# Patient Record
Sex: Male | Born: 1970 | Race: Black or African American | Hispanic: No | Marital: Single | Smoking: Current every day smoker
Health system: Southern US, Community
[De-identification: ages and names within clinical notes are randomized; demographics above are authoritative.]

## PROBLEM LIST (undated history)

## (undated) VITALS — BP 134/69 | HR 83 | Temp 98.7°F | Resp 18 | Ht 70.0 in | Wt 147.0 lb

## (undated) DIAGNOSIS — F329 Major depressive disorder, single episode, unspecified: Secondary | ICD-10-CM

## (undated) DIAGNOSIS — R739 Hyperglycemia, unspecified: Secondary | ICD-10-CM

## (undated) DIAGNOSIS — F319 Bipolar disorder, unspecified: Secondary | ICD-10-CM

## (undated) DIAGNOSIS — F419 Anxiety disorder, unspecified: Secondary | ICD-10-CM

## (undated) DIAGNOSIS — F1910 Other psychoactive substance abuse, uncomplicated: Secondary | ICD-10-CM

## (undated) DIAGNOSIS — N179 Acute kidney failure, unspecified: Secondary | ICD-10-CM

## (undated) DIAGNOSIS — R45851 Suicidal ideations: Secondary | ICD-10-CM

## (undated) DIAGNOSIS — R112 Nausea with vomiting, unspecified: Secondary | ICD-10-CM

## (undated) HISTORY — PX: NO PAST SURGERIES: SHX2092

---

## 1998-01-31 ENCOUNTER — Emergency Department (HOSPITAL_COMMUNITY): Admission: EM | Admit: 1998-01-31 | Discharge: 1998-01-31 | Payer: Self-pay

## 1998-09-01 DIAGNOSIS — F32A Depression, unspecified: Secondary | ICD-10-CM

## 1998-09-01 HISTORY — DX: Depression, unspecified: F32.A

## 1999-06-30 ENCOUNTER — Emergency Department (HOSPITAL_COMMUNITY): Admission: EM | Admit: 1999-06-30 | Discharge: 1999-06-30 | Payer: Self-pay | Admitting: Emergency Medicine

## 1999-07-04 ENCOUNTER — Observation Stay (HOSPITAL_COMMUNITY): Admission: EM | Admit: 1999-07-04 | Discharge: 1999-07-05 | Payer: Self-pay | Admitting: Emergency Medicine

## 1999-11-11 ENCOUNTER — Encounter: Payer: Self-pay | Admitting: Emergency Medicine

## 1999-11-11 ENCOUNTER — Emergency Department (HOSPITAL_COMMUNITY): Admission: EM | Admit: 1999-11-11 | Discharge: 1999-11-11 | Payer: Self-pay | Admitting: Emergency Medicine

## 1999-11-27 ENCOUNTER — Emergency Department (HOSPITAL_COMMUNITY): Admission: EM | Admit: 1999-11-27 | Discharge: 1999-11-27 | Payer: Self-pay | Admitting: Emergency Medicine

## 2000-01-01 ENCOUNTER — Emergency Department (HOSPITAL_COMMUNITY): Admission: EM | Admit: 2000-01-01 | Discharge: 2000-01-01 | Payer: Self-pay | Admitting: Emergency Medicine

## 2000-01-14 ENCOUNTER — Encounter: Admission: RE | Admit: 2000-01-14 | Discharge: 2000-04-13 | Payer: Self-pay | Admitting: Internal Medicine

## 2000-04-30 ENCOUNTER — Emergency Department (HOSPITAL_COMMUNITY): Admission: EM | Admit: 2000-04-30 | Discharge: 2000-04-30 | Payer: Self-pay | Admitting: Emergency Medicine

## 2000-09-26 ENCOUNTER — Emergency Department (HOSPITAL_COMMUNITY): Admission: EM | Admit: 2000-09-26 | Discharge: 2000-09-26 | Payer: Self-pay | Admitting: Emergency Medicine

## 2000-09-26 ENCOUNTER — Encounter: Payer: Self-pay | Admitting: Internal Medicine

## 2000-09-29 ENCOUNTER — Emergency Department (HOSPITAL_COMMUNITY): Admission: EM | Admit: 2000-09-29 | Discharge: 2000-09-29 | Payer: Self-pay | Admitting: Emergency Medicine

## 2000-11-13 ENCOUNTER — Emergency Department (HOSPITAL_COMMUNITY): Admission: EM | Admit: 2000-11-13 | Discharge: 2000-11-13 | Payer: Self-pay | Admitting: Emergency Medicine

## 2000-11-21 ENCOUNTER — Emergency Department (HOSPITAL_COMMUNITY): Admission: EM | Admit: 2000-11-21 | Discharge: 2000-11-21 | Payer: Self-pay | Admitting: Emergency Medicine

## 2000-12-07 ENCOUNTER — Emergency Department (HOSPITAL_COMMUNITY): Admission: EM | Admit: 2000-12-07 | Discharge: 2000-12-07 | Payer: Self-pay | Admitting: Emergency Medicine

## 2001-03-31 ENCOUNTER — Emergency Department (HOSPITAL_COMMUNITY): Admission: EM | Admit: 2001-03-31 | Discharge: 2001-04-01 | Payer: Self-pay | Admitting: Obstetrics and Gynecology

## 2001-04-01 ENCOUNTER — Emergency Department (HOSPITAL_COMMUNITY): Admission: EM | Admit: 2001-04-01 | Discharge: 2001-04-01 | Payer: Self-pay | Admitting: Emergency Medicine

## 2001-04-02 ENCOUNTER — Inpatient Hospital Stay (HOSPITAL_COMMUNITY): Admission: EM | Admit: 2001-04-02 | Discharge: 2001-04-04 | Payer: Self-pay | Admitting: Emergency Medicine

## 2001-04-02 ENCOUNTER — Encounter: Payer: Self-pay | Admitting: Emergency Medicine

## 2001-10-20 ENCOUNTER — Emergency Department (HOSPITAL_COMMUNITY): Admission: EM | Admit: 2001-10-20 | Discharge: 2001-10-20 | Payer: Self-pay | Admitting: Emergency Medicine

## 2001-11-27 ENCOUNTER — Emergency Department (HOSPITAL_COMMUNITY): Admission: EM | Admit: 2001-11-27 | Discharge: 2001-11-27 | Payer: Self-pay | Admitting: Emergency Medicine

## 2001-11-27 ENCOUNTER — Encounter: Payer: Self-pay | Admitting: Emergency Medicine

## 2002-02-12 ENCOUNTER — Emergency Department (HOSPITAL_COMMUNITY): Admission: EM | Admit: 2002-02-12 | Discharge: 2002-02-12 | Payer: Self-pay | Admitting: Emergency Medicine

## 2002-02-13 ENCOUNTER — Inpatient Hospital Stay (HOSPITAL_COMMUNITY): Admission: EM | Admit: 2002-02-13 | Discharge: 2002-02-17 | Payer: Self-pay

## 2002-02-23 ENCOUNTER — Encounter: Admission: RE | Admit: 2002-02-23 | Discharge: 2002-02-23 | Payer: Self-pay | Admitting: Sports Medicine

## 2002-03-04 ENCOUNTER — Emergency Department (HOSPITAL_COMMUNITY): Admission: EM | Admit: 2002-03-04 | Discharge: 2002-03-04 | Payer: Self-pay | Admitting: Emergency Medicine

## 2002-08-03 ENCOUNTER — Emergency Department (HOSPITAL_COMMUNITY): Admission: EM | Admit: 2002-08-03 | Discharge: 2002-08-03 | Payer: Self-pay | Admitting: Emergency Medicine

## 2002-08-03 ENCOUNTER — Encounter: Payer: Self-pay | Admitting: Emergency Medicine

## 2002-09-08 ENCOUNTER — Inpatient Hospital Stay (HOSPITAL_COMMUNITY): Admission: EM | Admit: 2002-09-08 | Discharge: 2002-09-10 | Payer: Self-pay | Admitting: Emergency Medicine

## 2002-10-11 ENCOUNTER — Emergency Department (HOSPITAL_COMMUNITY): Admission: EM | Admit: 2002-10-11 | Discharge: 2002-10-11 | Payer: Self-pay | Admitting: Emergency Medicine

## 2002-10-17 ENCOUNTER — Inpatient Hospital Stay (HOSPITAL_COMMUNITY): Admission: EM | Admit: 2002-10-17 | Discharge: 2002-10-18 | Payer: Self-pay | Admitting: Emergency Medicine

## 2002-12-17 ENCOUNTER — Inpatient Hospital Stay (HOSPITAL_COMMUNITY): Admission: EM | Admit: 2002-12-17 | Discharge: 2002-12-19 | Payer: Self-pay | Admitting: *Deleted

## 2003-07-17 ENCOUNTER — Observation Stay (HOSPITAL_COMMUNITY): Admission: EM | Admit: 2003-07-17 | Discharge: 2003-07-18 | Payer: Self-pay | Admitting: Emergency Medicine

## 2003-08-25 ENCOUNTER — Emergency Department (HOSPITAL_COMMUNITY): Admission: EM | Admit: 2003-08-25 | Discharge: 2003-08-25 | Payer: Self-pay | Admitting: *Deleted

## 2003-11-17 ENCOUNTER — Emergency Department (HOSPITAL_COMMUNITY): Admission: EM | Admit: 2003-11-17 | Discharge: 2003-11-17 | Payer: Self-pay | Admitting: Emergency Medicine

## 2004-01-15 ENCOUNTER — Inpatient Hospital Stay (HOSPITAL_COMMUNITY): Admission: EM | Admit: 2004-01-15 | Discharge: 2004-01-18 | Payer: Self-pay | Admitting: Emergency Medicine

## 2004-04-07 ENCOUNTER — Inpatient Hospital Stay (HOSPITAL_COMMUNITY): Admission: EM | Admit: 2004-04-07 | Discharge: 2004-04-09 | Payer: Self-pay | Admitting: Emergency Medicine

## 2004-06-21 ENCOUNTER — Inpatient Hospital Stay (HOSPITAL_COMMUNITY): Admission: EM | Admit: 2004-06-21 | Discharge: 2004-06-24 | Payer: Self-pay | Admitting: Emergency Medicine

## 2004-06-21 ENCOUNTER — Ambulatory Visit: Payer: Self-pay | Admitting: Family Medicine

## 2004-07-14 ENCOUNTER — Inpatient Hospital Stay (HOSPITAL_COMMUNITY): Admission: EM | Admit: 2004-07-14 | Discharge: 2004-07-17 | Payer: Self-pay | Admitting: Emergency Medicine

## 2004-12-09 ENCOUNTER — Observation Stay (HOSPITAL_COMMUNITY): Admission: EM | Admit: 2004-12-09 | Discharge: 2004-12-10 | Payer: Self-pay | Admitting: Emergency Medicine

## 2004-12-21 ENCOUNTER — Emergency Department (HOSPITAL_COMMUNITY): Admission: EM | Admit: 2004-12-21 | Discharge: 2004-12-21 | Payer: Self-pay | Admitting: Emergency Medicine

## 2005-01-08 ENCOUNTER — Inpatient Hospital Stay (HOSPITAL_COMMUNITY): Admission: EM | Admit: 2005-01-08 | Discharge: 2005-01-10 | Payer: Self-pay | Admitting: Emergency Medicine

## 2005-01-22 ENCOUNTER — Inpatient Hospital Stay (HOSPITAL_COMMUNITY): Admission: EM | Admit: 2005-01-22 | Discharge: 2005-01-25 | Payer: Self-pay | Admitting: Emergency Medicine

## 2005-02-03 ENCOUNTER — Inpatient Hospital Stay (HOSPITAL_COMMUNITY): Admission: EM | Admit: 2005-02-03 | Discharge: 2005-02-06 | Payer: Self-pay | Admitting: Emergency Medicine

## 2005-02-08 ENCOUNTER — Emergency Department (HOSPITAL_COMMUNITY): Admission: EM | Admit: 2005-02-08 | Discharge: 2005-02-08 | Payer: Self-pay | Admitting: Emergency Medicine

## 2005-02-09 ENCOUNTER — Emergency Department (HOSPITAL_COMMUNITY): Admission: EM | Admit: 2005-02-09 | Discharge: 2005-02-10 | Payer: Self-pay | Admitting: Emergency Medicine

## 2005-02-23 ENCOUNTER — Inpatient Hospital Stay (HOSPITAL_COMMUNITY): Admission: EM | Admit: 2005-02-23 | Discharge: 2005-02-26 | Payer: Self-pay | Admitting: Emergency Medicine

## 2005-02-23 ENCOUNTER — Ambulatory Visit: Payer: Self-pay | Admitting: Internal Medicine

## 2005-03-17 ENCOUNTER — Inpatient Hospital Stay (HOSPITAL_COMMUNITY): Admission: EM | Admit: 2005-03-17 | Discharge: 2005-03-19 | Payer: Self-pay | Admitting: Emergency Medicine

## 2005-05-19 ENCOUNTER — Ambulatory Visit: Payer: Self-pay | Admitting: Internal Medicine

## 2005-05-19 ENCOUNTER — Inpatient Hospital Stay (HOSPITAL_COMMUNITY): Admission: EM | Admit: 2005-05-19 | Discharge: 2005-05-23 | Payer: Self-pay | Admitting: Emergency Medicine

## 2005-05-29 ENCOUNTER — Ambulatory Visit: Payer: Self-pay | Admitting: Internal Medicine

## 2005-06-04 ENCOUNTER — Inpatient Hospital Stay (HOSPITAL_COMMUNITY): Admission: RE | Admit: 2005-06-04 | Discharge: 2005-06-10 | Payer: Self-pay | Admitting: Psychiatry

## 2005-06-05 ENCOUNTER — Ambulatory Visit: Payer: Self-pay | Admitting: Psychiatry

## 2005-06-16 ENCOUNTER — Inpatient Hospital Stay (HOSPITAL_COMMUNITY): Admission: EM | Admit: 2005-06-16 | Discharge: 2005-06-20 | Payer: Self-pay | Admitting: Emergency Medicine

## 2005-08-09 ENCOUNTER — Emergency Department (HOSPITAL_COMMUNITY): Admission: EM | Admit: 2005-08-09 | Discharge: 2005-08-09 | Payer: Self-pay | Admitting: Emergency Medicine

## 2005-08-14 ENCOUNTER — Inpatient Hospital Stay (HOSPITAL_COMMUNITY): Admission: EM | Admit: 2005-08-14 | Discharge: 2005-08-15 | Payer: Self-pay | Admitting: Emergency Medicine

## 2005-08-21 ENCOUNTER — Ambulatory Visit: Payer: Self-pay | Admitting: Internal Medicine

## 2005-08-21 ENCOUNTER — Inpatient Hospital Stay (HOSPITAL_COMMUNITY): Admission: EM | Admit: 2005-08-21 | Discharge: 2005-08-26 | Payer: Self-pay | Admitting: Emergency Medicine

## 2005-08-28 ENCOUNTER — Ambulatory Visit: Payer: Self-pay | Admitting: Internal Medicine

## 2005-12-13 ENCOUNTER — Inpatient Hospital Stay (HOSPITAL_COMMUNITY): Admission: EM | Admit: 2005-12-13 | Discharge: 2005-12-15 | Payer: Self-pay | Admitting: Emergency Medicine

## 2005-12-13 ENCOUNTER — Ambulatory Visit: Payer: Self-pay | Admitting: Internal Medicine

## 2005-12-14 ENCOUNTER — Encounter: Payer: Self-pay | Admitting: Vascular Surgery

## 2006-01-14 ENCOUNTER — Ambulatory Visit: Payer: Self-pay | Admitting: Hospitalist

## 2006-01-14 ENCOUNTER — Inpatient Hospital Stay (HOSPITAL_COMMUNITY): Admission: EM | Admit: 2006-01-14 | Discharge: 2006-01-16 | Payer: Self-pay | Admitting: Internal Medicine

## 2006-01-18 ENCOUNTER — Inpatient Hospital Stay (HOSPITAL_COMMUNITY): Admission: EM | Admit: 2006-01-18 | Discharge: 2006-01-19 | Payer: Self-pay | Admitting: Emergency Medicine

## 2006-05-31 ENCOUNTER — Inpatient Hospital Stay (HOSPITAL_COMMUNITY): Admission: EM | Admit: 2006-05-31 | Discharge: 2006-06-03 | Payer: Self-pay | Admitting: *Deleted

## 2006-06-11 ENCOUNTER — Inpatient Hospital Stay (HOSPITAL_COMMUNITY): Admission: EM | Admit: 2006-06-11 | Discharge: 2006-06-18 | Payer: Self-pay | Admitting: Emergency Medicine

## 2006-06-11 ENCOUNTER — Ambulatory Visit: Payer: Self-pay | Admitting: Internal Medicine

## 2006-09-30 ENCOUNTER — Inpatient Hospital Stay (HOSPITAL_COMMUNITY): Admission: AD | Admit: 2006-09-30 | Discharge: 2006-10-06 | Payer: Self-pay | Admitting: Internal Medicine

## 2006-09-30 ENCOUNTER — Encounter: Payer: Self-pay | Admitting: Internal Medicine

## 2006-10-26 ENCOUNTER — Inpatient Hospital Stay (HOSPITAL_COMMUNITY): Admission: EM | Admit: 2006-10-26 | Discharge: 2006-10-29 | Payer: Self-pay | Admitting: Emergency Medicine

## 2006-10-26 ENCOUNTER — Ambulatory Visit: Payer: Self-pay | Admitting: Cardiology

## 2006-10-26 ENCOUNTER — Ambulatory Visit: Payer: Self-pay | Admitting: Family Medicine

## 2006-10-27 ENCOUNTER — Ambulatory Visit: Payer: Self-pay | Admitting: Vascular Surgery

## 2006-10-28 ENCOUNTER — Encounter: Payer: Self-pay | Admitting: Cardiology

## 2006-10-31 ENCOUNTER — Ambulatory Visit: Payer: Self-pay | Admitting: Family Medicine

## 2006-10-31 ENCOUNTER — Inpatient Hospital Stay (HOSPITAL_COMMUNITY): Admission: EM | Admit: 2006-10-31 | Discharge: 2006-11-12 | Payer: Self-pay | Admitting: Emergency Medicine

## 2006-11-16 ENCOUNTER — Ambulatory Visit: Payer: Self-pay | Admitting: Internal Medicine

## 2006-11-16 DIAGNOSIS — I81 Portal vein thrombosis: Secondary | ICD-10-CM

## 2006-11-16 LAB — CONVERTED CEMR LAB: INR: 4

## 2006-11-17 ENCOUNTER — Telehealth: Payer: Self-pay | Admitting: Family Medicine

## 2006-11-20 ENCOUNTER — Ambulatory Visit: Payer: Self-pay | Admitting: Family Medicine

## 2006-11-20 LAB — CONVERTED CEMR LAB

## 2006-12-14 ENCOUNTER — Encounter: Payer: Self-pay | Admitting: Family Medicine

## 2007-04-26 ENCOUNTER — Ambulatory Visit: Payer: Self-pay | Admitting: Internal Medicine

## 2007-04-26 ENCOUNTER — Inpatient Hospital Stay (HOSPITAL_COMMUNITY): Admission: AD | Admit: 2007-04-26 | Discharge: 2007-05-03 | Payer: Self-pay | Admitting: Internal Medicine

## 2007-04-26 DIAGNOSIS — E109 Type 1 diabetes mellitus without complications: Secondary | ICD-10-CM | POA: Insufficient documentation

## 2007-04-26 DIAGNOSIS — R109 Unspecified abdominal pain: Secondary | ICD-10-CM | POA: Insufficient documentation

## 2007-04-26 DIAGNOSIS — F17200 Nicotine dependence, unspecified, uncomplicated: Secondary | ICD-10-CM

## 2007-04-26 LAB — CONVERTED CEMR LAB: Blood Glucose, Fingerstick: 395

## 2007-04-27 ENCOUNTER — Encounter (INDEPENDENT_AMBULATORY_CARE_PROVIDER_SITE_OTHER): Payer: Self-pay | Admitting: *Deleted

## 2007-05-12 ENCOUNTER — Encounter (INDEPENDENT_AMBULATORY_CARE_PROVIDER_SITE_OTHER): Payer: Self-pay | Admitting: *Deleted

## 2007-05-12 ENCOUNTER — Ambulatory Visit: Payer: Self-pay | Admitting: Internal Medicine

## 2007-05-12 DIAGNOSIS — R894 Abnormal immunological findings in specimens from other organs, systems and tissues: Secondary | ICD-10-CM | POA: Insufficient documentation

## 2007-05-12 LAB — CONVERTED CEMR LAB
BUN: 22 mg/dL (ref 6–23)
Bilirubin Urine: NEGATIVE
Blood in Urine, dipstick: NEGATIVE
Chloride: 100 meq/L (ref 96–112)
Glucose, Urine, Semiquant: >=1000
Nitrite: NEGATIVE
Platelets: 285 10*3/uL (ref 150–400)
Potassium: 4.6 meq/L (ref 3.5–5.3)
Protein, U semiquant: NEGATIVE
Sodium: 136 meq/L (ref 135–145)
Urobilinogen, UA: 0.2
WBC Urine, dipstick: NEGATIVE

## 2007-05-18 ENCOUNTER — Telehealth: Payer: Self-pay | Admitting: *Deleted

## 2007-05-18 ENCOUNTER — Ambulatory Visit: Payer: Self-pay | Admitting: Family Medicine

## 2007-06-04 ENCOUNTER — Emergency Department (HOSPITAL_COMMUNITY): Admission: EM | Admit: 2007-06-04 | Discharge: 2007-06-05 | Payer: Self-pay | Admitting: Emergency Medicine

## 2007-06-07 ENCOUNTER — Inpatient Hospital Stay (HOSPITAL_COMMUNITY): Admission: EM | Admit: 2007-06-07 | Discharge: 2007-06-10 | Payer: Self-pay | Admitting: Emergency Medicine

## 2007-06-07 ENCOUNTER — Ambulatory Visit: Payer: Self-pay | Admitting: Internal Medicine

## 2007-08-16 ENCOUNTER — Emergency Department (HOSPITAL_COMMUNITY): Admission: EM | Admit: 2007-08-16 | Discharge: 2007-08-16 | Payer: Self-pay | Admitting: Emergency Medicine

## 2007-10-14 ENCOUNTER — Ambulatory Visit: Payer: Self-pay | Admitting: Family Medicine

## 2007-10-14 ENCOUNTER — Ambulatory Visit: Payer: Self-pay | Admitting: *Deleted

## 2007-10-14 LAB — CONVERTED CEMR LAB
AST: 20 units/L (ref 0–37)
Alkaline Phosphatase: 88 units/L (ref 39–117)
BUN: 20 mg/dL (ref 6–23)
Basophils Absolute: 0 10*3/uL (ref 0.0–0.1)
CO2: 25 meq/L (ref 19–32)
Creatinine, Ser: 0.84 mg/dL (ref 0.40–1.50)
Lymphocytes Relative: 29 % (ref 12–46)
Lymphs Abs: 1.2 10*3/uL (ref 0.7–4.0)
MCHC: 31.3 g/dL (ref 30.0–36.0)
MCV: 91.1 fL (ref 78.0–100.0)
Microalb, Ur: 0.25 mg/dL (ref 0.00–1.89)
Monocytes Relative: 8 % (ref 3–12)
Neutro Abs: 2.7 10*3/uL (ref 1.7–7.7)
RBC: 4.59 M/uL (ref 4.22–5.81)
RDW: 15.9 % — ABNORMAL HIGH (ref 11.5–15.5)
Total Bilirubin: 0.4 mg/dL (ref 0.3–1.2)
Total Protein: 6.8 g/dL (ref 6.0–8.3)
Triglycerides: 180 mg/dL — ABNORMAL HIGH (ref <150)
WBC: 4.3 10*3/uL (ref 4.0–10.5)

## 2007-12-20 ENCOUNTER — Ambulatory Visit: Payer: Self-pay | Admitting: Family Medicine

## 2007-12-22 ENCOUNTER — Ambulatory Visit (HOSPITAL_COMMUNITY): Admission: RE | Admit: 2007-12-22 | Discharge: 2007-12-22 | Payer: Self-pay | Admitting: Family Medicine

## 2008-06-14 ENCOUNTER — Emergency Department (HOSPITAL_COMMUNITY): Admission: EM | Admit: 2008-06-14 | Discharge: 2008-06-14 | Payer: Self-pay | Admitting: Emergency Medicine

## 2008-06-18 ENCOUNTER — Inpatient Hospital Stay (HOSPITAL_COMMUNITY): Admission: EM | Admit: 2008-06-18 | Discharge: 2008-06-20 | Payer: Self-pay | Admitting: Emergency Medicine

## 2008-06-18 ENCOUNTER — Ambulatory Visit: Payer: Self-pay | Admitting: Internal Medicine

## 2008-08-31 IMAGING — CR DG ABDOMEN 2V
2 series · 2 of 2 positions shown · non-contrast
Comparison: none

CLINICAL DATA: Abdominal pain.  Assess for constipation.
 ABDOMEN ? 2 VIEW:

[w abdomen upright]
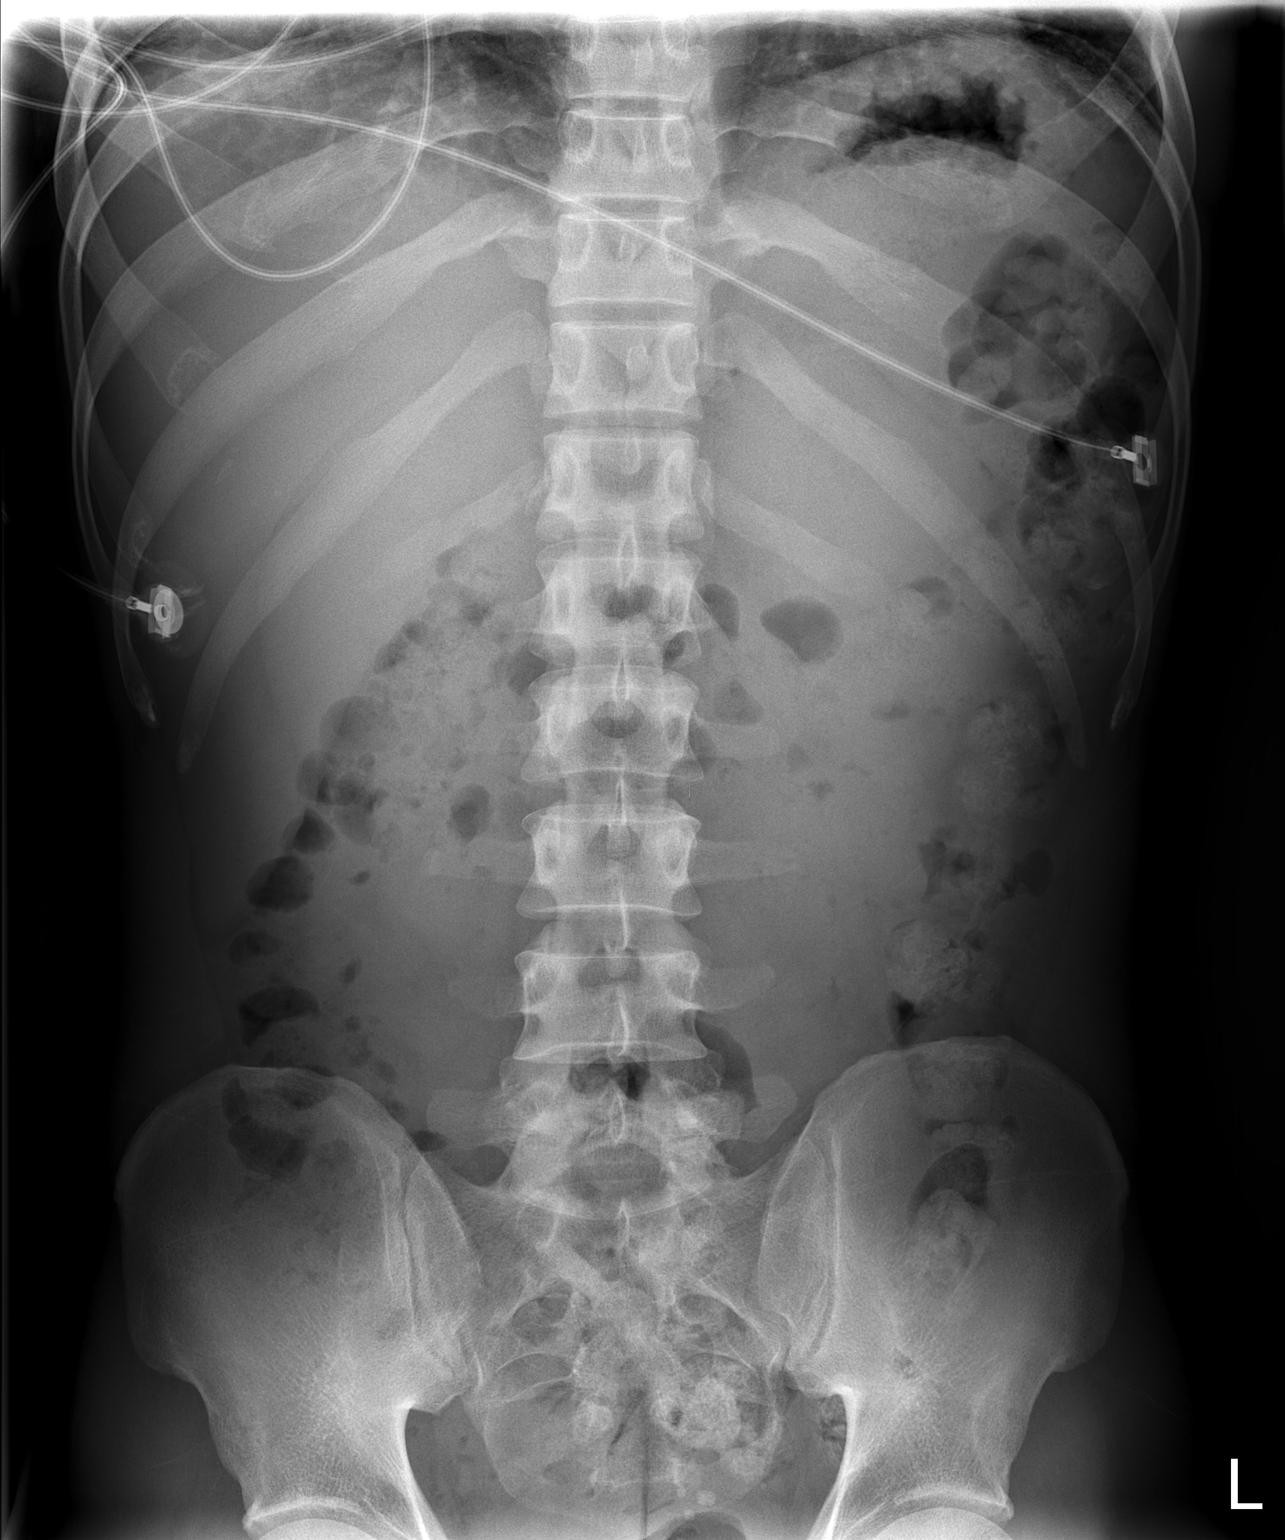

[t abdomen supine]
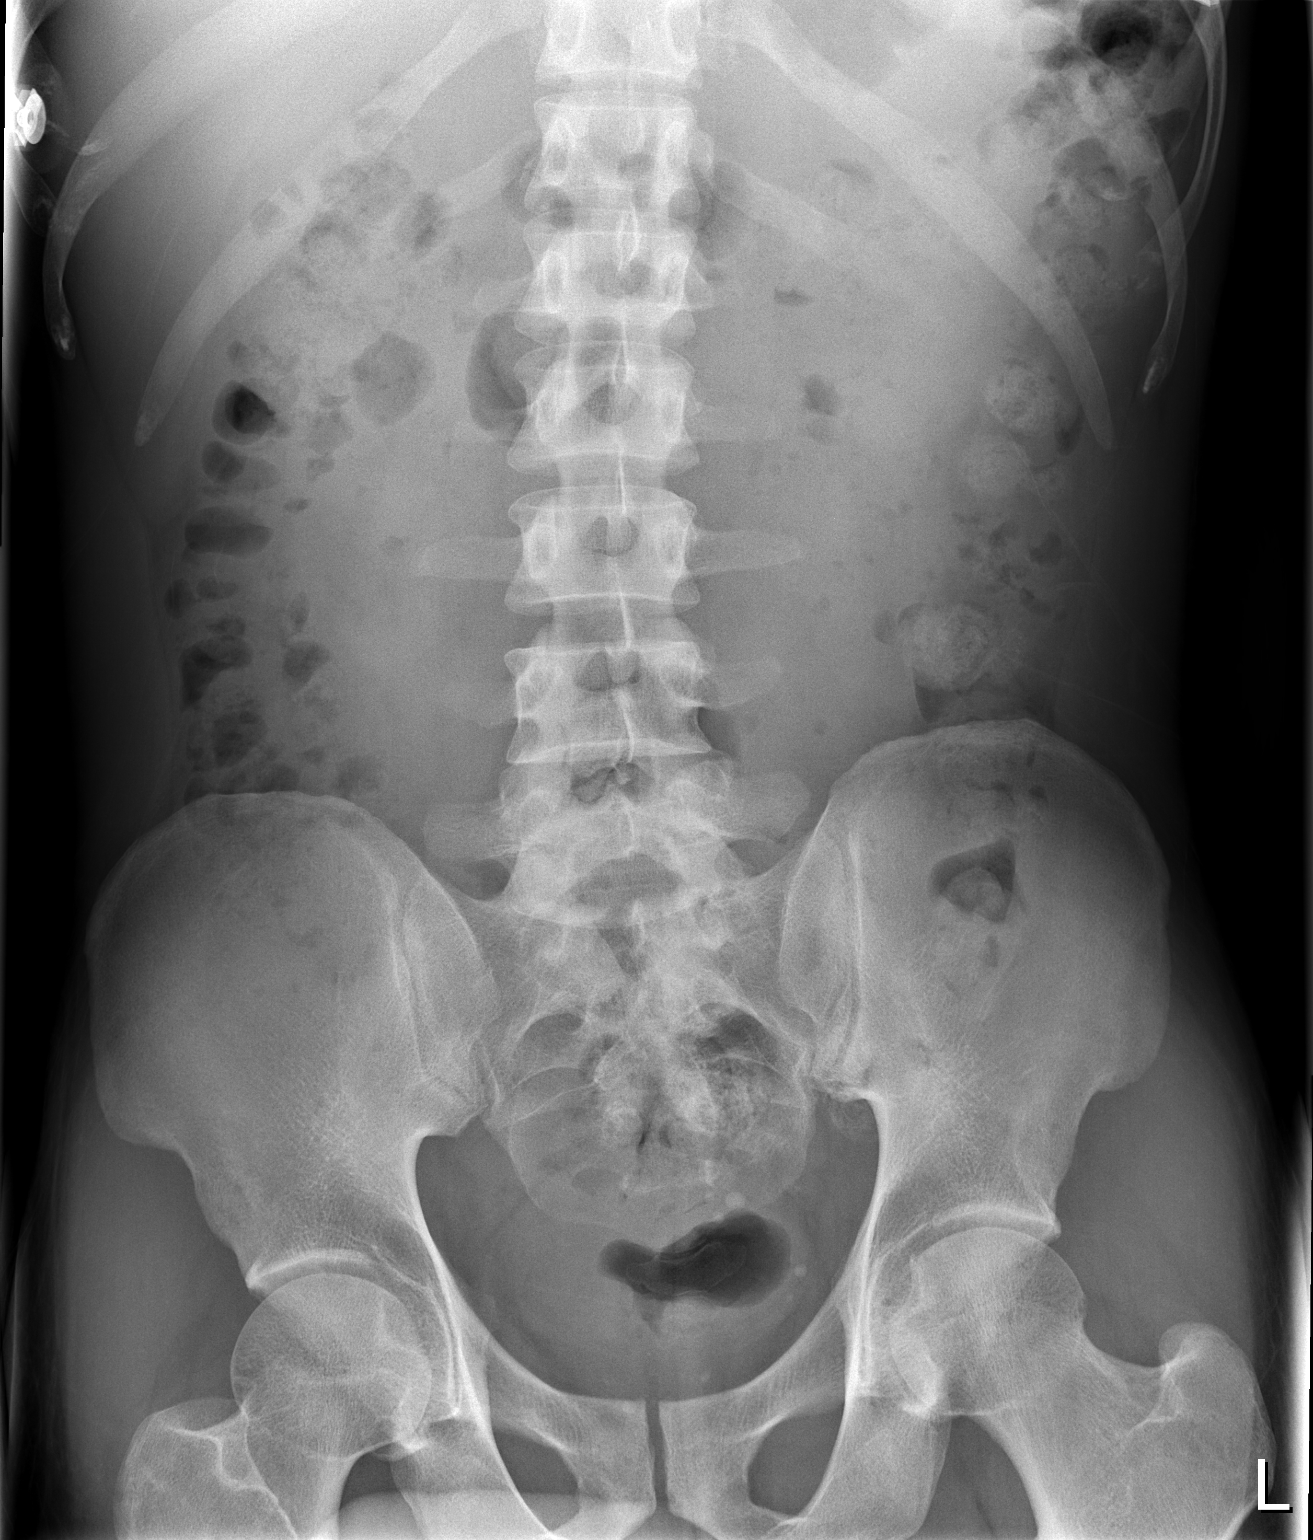

[2 of 2 positions shown; findings below may reference images not displayed]

FINDINGS: Small bowel gas pattern is normal.  The colonic gas pattern is unremarkable wit a grossly normal volume of stool.  No worrisome calcifications or bony findings.
IMPRESSION: Within normal limits.

## 2008-09-03 IMAGING — CR DG ABDOMEN 2V
1 series · 1 of 1 positions shown · non-contrast
Comparison: 10/28/06.

CLINICAL DATA: Vomiting.
 ABDOMEN ? 2 VIEW:

[view not recorded]
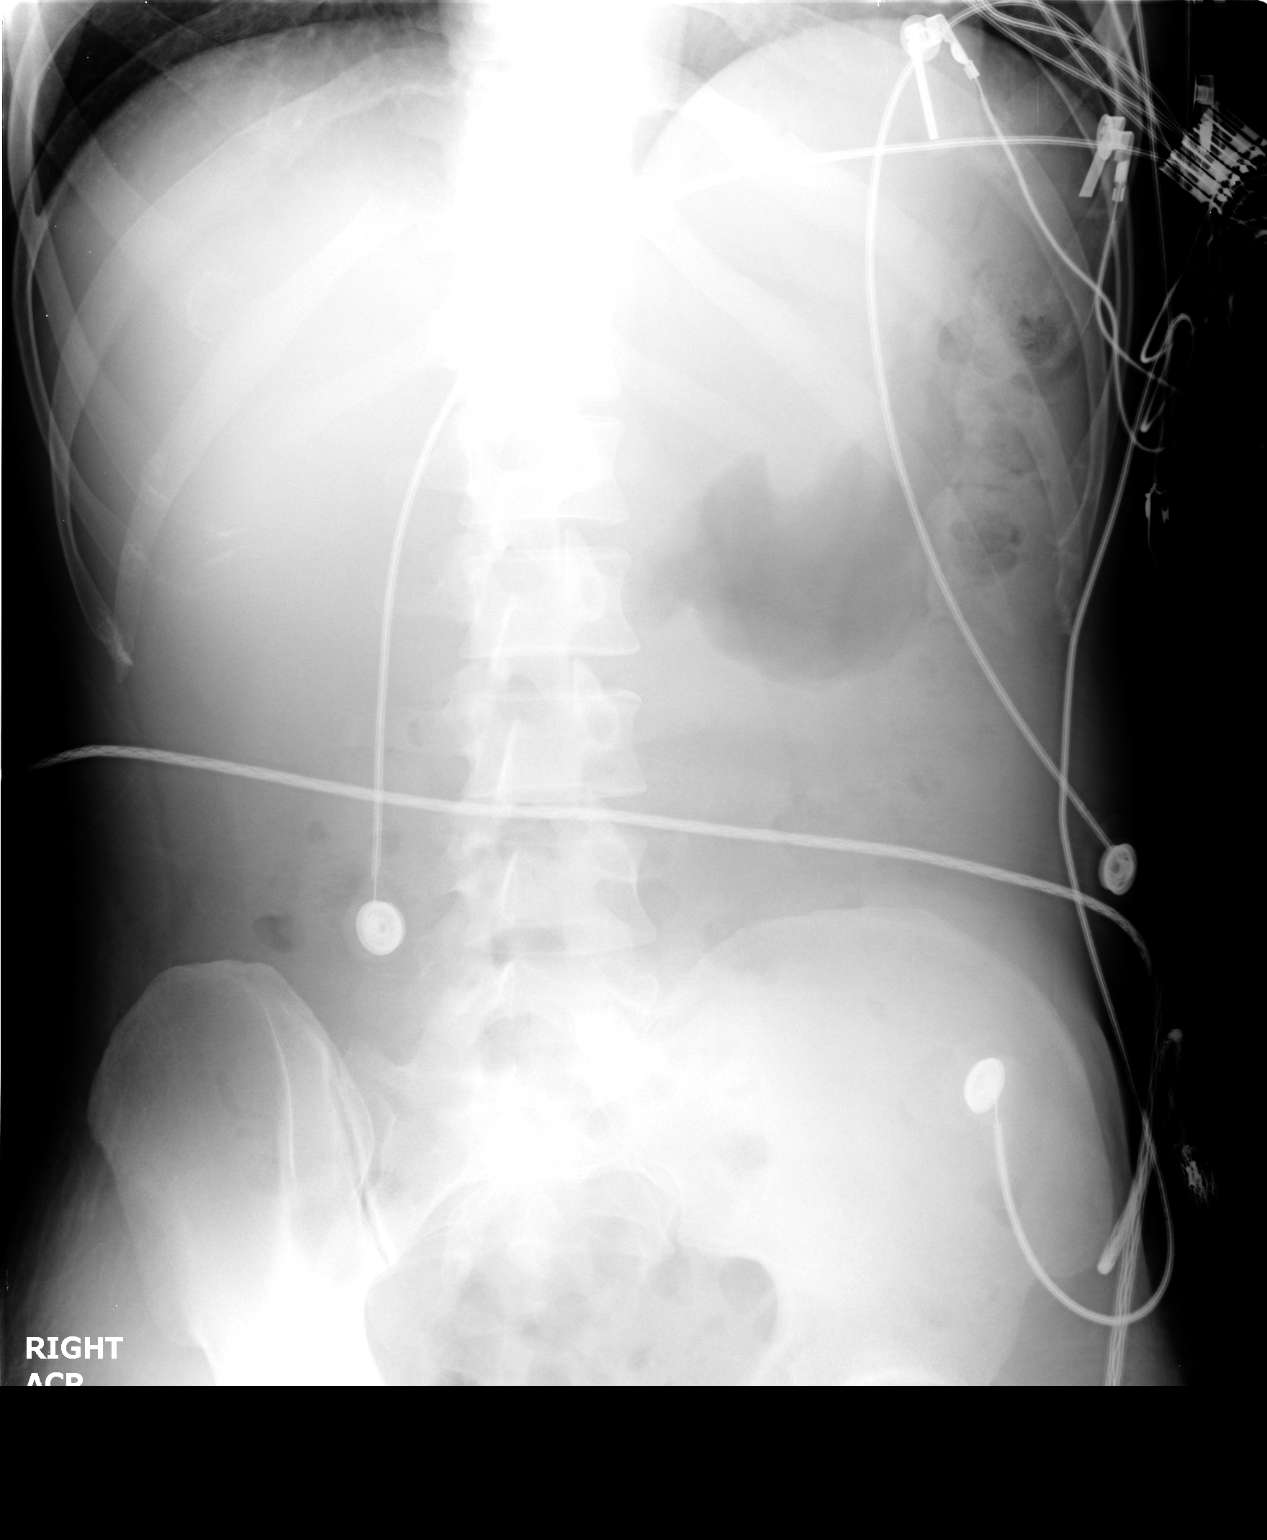

[1 of 1 positions shown; findings below may reference images not displayed]

FINDINGS: No free air is seen on decubitus projection.  Bowel gas pattern is normal.  No acute osseous finding.
IMPRESSION: Normal bowel gas pattern.

## 2008-10-16 ENCOUNTER — Emergency Department (HOSPITAL_COMMUNITY): Admission: EM | Admit: 2008-10-16 | Discharge: 2008-10-16 | Payer: Self-pay | Admitting: Emergency Medicine

## 2009-04-09 ENCOUNTER — Emergency Department (HOSPITAL_COMMUNITY): Admission: EM | Admit: 2009-04-09 | Discharge: 2009-04-09 | Payer: Self-pay | Admitting: Emergency Medicine

## 2009-04-10 ENCOUNTER — Encounter: Payer: Self-pay | Admitting: Internal Medicine

## 2009-06-01 ENCOUNTER — Encounter (INDEPENDENT_AMBULATORY_CARE_PROVIDER_SITE_OTHER): Payer: Self-pay | Admitting: Adult Health

## 2009-06-01 ENCOUNTER — Ambulatory Visit: Payer: Self-pay | Admitting: Family Medicine

## 2009-06-01 LAB — CONVERTED CEMR LAB
ALT: 31 units/L (ref 0–53)
Albumin: 4.1 g/dL (ref 3.5–5.2)
BUN: 16 mg/dL (ref 6–23)
Basophils Absolute: 0 10*3/uL (ref 0.0–0.1)
Chloride: 99 meq/L (ref 96–112)
Creatinine, Ser: 1.22 mg/dL (ref 0.40–1.50)
HCT: 40.2 % (ref 39.0–52.0)
HDL: 58 mg/dL (ref >39)
Hemoglobin: 13.5 g/dL (ref 13.0–17.0)
LDL Cholesterol: 101 mg/dL — ABNORMAL HIGH (ref 0–99)
Lymphs Abs: 1.6 10*3/uL (ref 0.7–4.0)
MCV: 86.8 fL (ref 78.0–100.0)
Monocytes Relative: 6 % (ref 3–12)
Neutrophils Relative %: 55 % (ref 43–77)
RBC: 4.63 M/uL (ref 4.22–5.81)
Testosterone: 139.7 ng/dL — ABNORMAL LOW (ref 350–890)
Total CHOL/HDL Ratio: 3.7
Triglycerides: 274 mg/dL — ABNORMAL HIGH (ref <150)
Vit D, 25-Hydroxy: 24 ng/mL — ABNORMAL LOW (ref 30–89)
WBC: 4.3 10*3/uL (ref 4.0–10.5)

## 2009-10-18 ENCOUNTER — Ambulatory Visit: Payer: Self-pay | Admitting: Family Medicine

## 2010-02-11 ENCOUNTER — Emergency Department (HOSPITAL_COMMUNITY)
Admission: EM | Admit: 2010-02-11 | Discharge: 2010-02-12 | Payer: Self-pay | Source: Home / Self Care | Admitting: Emergency Medicine

## 2010-05-23 ENCOUNTER — Emergency Department (HOSPITAL_COMMUNITY): Admission: EM | Admit: 2010-05-23 | Discharge: 2010-05-23 | Payer: Self-pay | Admitting: Emergency Medicine

## 2010-09-01 DIAGNOSIS — F1910 Other psychoactive substance abuse, uncomplicated: Secondary | ICD-10-CM

## 2010-09-01 HISTORY — DX: Other psychoactive substance abuse, uncomplicated: F19.10

## 2010-10-20 ENCOUNTER — Emergency Department (HOSPITAL_COMMUNITY)
Admission: EM | Admit: 2010-10-20 | Discharge: 2010-10-20 | Disposition: A | Discharge status: Home / Self Care | Payer: Self-pay | Attending: Emergency Medicine | Admitting: Emergency Medicine

## 2010-10-20 DIAGNOSIS — I10 Essential (primary) hypertension: Secondary | ICD-10-CM | POA: Insufficient documentation

## 2010-10-20 DIAGNOSIS — E119 Type 2 diabetes mellitus without complications: Secondary | ICD-10-CM | POA: Insufficient documentation

## 2010-10-20 DIAGNOSIS — R197 Diarrhea, unspecified: Secondary | ICD-10-CM | POA: Insufficient documentation

## 2010-10-20 DIAGNOSIS — D6859 Other primary thrombophilia: Secondary | ICD-10-CM | POA: Insufficient documentation

## 2010-10-20 DIAGNOSIS — R112 Nausea with vomiting, unspecified: Secondary | ICD-10-CM | POA: Insufficient documentation

## 2010-10-20 DIAGNOSIS — R109 Unspecified abdominal pain: Secondary | ICD-10-CM | POA: Insufficient documentation

## 2010-10-20 LAB — GLUCOSE, CAPILLARY: Glucose-Capillary: 163 mg/dL — ABNORMAL HIGH (ref 70–99)

## 2010-10-20 LAB — CBC
Hemoglobin: 14.2 g/dL (ref 13.0–17.0)
MCHC: 33.1 g/dL (ref 30.0–36.0)
WBC: 7.8 10*3/uL (ref 4.0–10.5)

## 2010-10-20 LAB — COMPREHENSIVE METABOLIC PANEL
ALT: 20 U/L (ref 0–53)
AST: 23 U/L (ref 0–37)
CO2: 31 mEq/L (ref 19–32)
Calcium: 9.9 mg/dL (ref 8.4–10.5)
GFR calc Af Amer: >60 mL/min (ref >60)
GFR calc non Af Amer: >60 mL/min (ref >60)
Potassium: 4.5 mEq/L (ref 3.5–5.1)
Sodium: 142 mEq/L (ref 135–145)
Total Protein: 7.4 g/dL (ref 6.0–8.3)

## 2010-10-20 LAB — DIFFERENTIAL
Basophils Absolute: 0 10*3/uL (ref 0.0–0.1)
Basophils Relative: 0 % (ref 0–1)
Monocytes Absolute: 1 10*3/uL (ref 0.1–1.0)
Neutro Abs: 5 10*3/uL (ref 1.7–7.7)
Neutrophils Relative %: 64 % (ref 43–77)

## 2010-11-14 LAB — URINALYSIS, ROUTINE W REFLEX MICROSCOPIC
Glucose, UA: >1000 mg/dL — AB
Ketones, ur: 15 mg/dL — AB
Leukocytes, UA: NEGATIVE
Nitrite: NEGATIVE
Protein, ur: NEGATIVE mg/dL

## 2010-11-14 LAB — COMPREHENSIVE METABOLIC PANEL
ALT: 42 U/L (ref 0–53)
Alkaline Phosphatase: 83 U/L (ref 39–117)
CO2: 26 mEq/L (ref 19–32)
GFR calc non Af Amer: >60 mL/min (ref >60)
Glucose, Bld: 420 mg/dL — ABNORMAL HIGH (ref 70–99)
Potassium: 5.2 mEq/L — ABNORMAL HIGH (ref 3.5–5.1)
Sodium: 134 mEq/L — ABNORMAL LOW (ref 135–145)

## 2010-11-14 LAB — LIPASE, BLOOD: Lipase: 36 U/L (ref 11–59)

## 2010-11-14 LAB — CBC
HCT: 40.9 % (ref 39.0–52.0)
Hemoglobin: 14.1 g/dL (ref 13.0–17.0)
WBC: 4 10*3/uL (ref 4.0–10.5)

## 2010-11-14 LAB — GLUCOSE, CAPILLARY

## 2010-11-14 LAB — DIFFERENTIAL
Basophils Relative: 1 % (ref 0–1)
Eosinophils Absolute: 0.1 10*3/uL (ref 0.0–0.7)
Neutrophils Relative %: 47 % (ref 43–77)

## 2010-11-18 LAB — GLUCOSE, CAPILLARY
Glucose-Capillary: 387 mg/dL — ABNORMAL HIGH (ref 70–99)
Glucose-Capillary: 590 mg/dL (ref 70–99)

## 2010-12-07 LAB — POCT I-STAT, CHEM 8
Calcium, Ion: 1.16 mmol/L (ref 1.12–1.32)
Glucose, Bld: 491 mg/dL — ABNORMAL HIGH (ref 70–99)
HCT: 48 % (ref 39.0–52.0)
Hemoglobin: 16.3 g/dL (ref 13.0–17.0)
Potassium: 4.6 mEq/L (ref 3.5–5.1)
TCO2: 26 mmol/L (ref 0–100)

## 2010-12-07 LAB — POCT I-STAT 3, ART BLOOD GAS (G3+)
O2 Saturation: 91 %
Patient temperature: 98.6
TCO2: 27 mmol/L (ref 0–100)
pH, Arterial: 7.364 (ref 7.350–7.450)

## 2010-12-07 LAB — GLUCOSE, CAPILLARY
Glucose-Capillary: 149 mg/dL — ABNORMAL HIGH (ref 70–99)
Glucose-Capillary: 224 mg/dL — ABNORMAL HIGH (ref 70–99)
Glucose-Capillary: 575 mg/dL (ref 70–99)

## 2010-12-07 LAB — URINALYSIS, ROUTINE W REFLEX MICROSCOPIC
Bilirubin Urine: NEGATIVE
Ketones, ur: 40 mg/dL — AB
Nitrite: NEGATIVE
Protein, ur: NEGATIVE mg/dL
Urobilinogen, UA: 0.2 mg/dL (ref 0.0–1.0)

## 2010-12-17 LAB — CBC
HCT: 42.3 % (ref 39.0–52.0)
Hemoglobin: 14.4 g/dL (ref 13.0–17.0)
WBC: 5.5 10*3/uL (ref 4.0–10.5)

## 2010-12-17 LAB — DIFFERENTIAL
Basophils Absolute: 0 10*3/uL (ref 0.0–0.1)
Basophils Relative: 0 % (ref 0–1)
Neutro Abs: 3 10*3/uL (ref 1.7–7.7)
Neutrophils Relative %: 55 % (ref 43–77)

## 2010-12-17 LAB — URINALYSIS, ROUTINE W REFLEX MICROSCOPIC
Bilirubin Urine: NEGATIVE
Glucose, UA: >1000 mg/dL — AB
Ketones, ur: NEGATIVE mg/dL
Leukocytes, UA: NEGATIVE
Nitrite: NEGATIVE
Protein, ur: NEGATIVE mg/dL

## 2010-12-17 LAB — GLUCOSE, CAPILLARY: Glucose-Capillary: 422 mg/dL — ABNORMAL HIGH (ref 70–99)

## 2010-12-17 LAB — COMPREHENSIVE METABOLIC PANEL
Alkaline Phosphatase: 74 U/L (ref 39–117)
BUN: 12 mg/dL (ref 6–23)
CO2: 29 mEq/L (ref 19–32)
Chloride: 97 mEq/L (ref 96–112)
Glucose, Bld: 359 mg/dL — ABNORMAL HIGH (ref 70–99)
Potassium: 4 mEq/L (ref 3.5–5.1)
Total Bilirubin: 0.7 mg/dL (ref 0.3–1.2)

## 2011-01-14 NOTE — Consult Note (Signed)
NAME:  [name], [name] NO.:  [id number]   MEDICAL RECORD NO.:  [id number]          PATIENT TYPE:  INP   LOCATION:  5532                         FACILITY:  [facility]   PHYSICIAN:  Antonietta Breach, M.D.  DATE OF BIRTH:  [birth year]   DATE OF CONSULTATION:  06/19/2008  DATE OF DISCHARGE:                                 CONSULTATION   REASON FOR CONSULTATION:  Cocaine relapse, depression, suicide attempt.   REQUESTING PHYSICIAN:  Acey Lav, MD.   HISTORY OF PRESENT ILLNESS:  [name] is a 40 year old male  admitted to the [facility] on June 18, 2008 due to a drug  overdose.   [name] took the overdose during acute effects of the cocaine.  He was  feeling extreme shame and guilt about relapsing.  Since that time, he  has developed appropriate regret about the overdose.  He wants to live.  He has hope and has constructive future goals.  He is not having any  thoughts of harming himself or others.  He has no delusions or  hallucinations.  He is motivated for returning to a recovery program,  however, he declines any residential or inpatient treatment because he  wants to keep his job and cannot afford to miss any more time.   PAST PSYCHIATRIC HISTORY:  [name] has never been treated for  depression.  He has undergone a chemical dependency rehabilitation  treatment before, however, he was admitted to the [facility] previously.  He does have a history of prior suicide attempt  associated with relapse.   FAMILY PSYCHIATRIC HISTORY:  None known.   SOCIAL HISTORY:  [name] is single.  He is working.  Religion is  [employer].  He has not been attending his 12-step groups.   PAST MEDICAL HISTORY:  Status post overdose on lisinopril and warfarin.   MENTAL STATUS EXAM:  [name] is alert.  He is oriented to all spheres.  His mood is within normal limits.  Affect is broad and appropriate.  Memory function is intact.  Speech within  normal limits.  Thought  process logical, coherent and goal directed.  No looseness of  associations.  Thought content, no thoughts of harming himself.  No  thoughts of harming others no delusions, no hallucinations.  Insight is  intact.  Judgment is intact.   ASSESSMENT:  AXIS I:  Depressive disorder not otherwise specified.  This  appears to be secondary to a cocaine intoxication followed by the  typical drop in mood and decreased energy compounded, however, by the  shame and guilt of acute relapse.  Cocaine dependence.  AXIS II:  Deferred.  AXIS III:  Status post lisinopril and warfarin overdose.  AXIS IV:  Primary support group.  AXIS V:  55.   [name] is not at risk to harm himself or others.  He agrees to call  emergency services immediately for any thoughts of harming himself,  thoughts of harming others or distress.   The undersigned recommended that he be admitted to an inpatient  rehabilitation program, however, the patient  declined.  He is no longer  committable now that he is recovered from his acute symptoms and  intoxication.  However, he is motivated for outpatient care and would  like to return to 12-step groups.   RECOMMENDATIONS:  Would ask the social worker to set [name] up with a  [facility] [address] appointment and [name] will return to 12-step groups in 90  days is the standard recommendation after relapse.  No psychotropic  medications required.      Antonietta Breach, M.D.  Electronically Signed     JW/MEDQ  D:  06/20/2008  T:  06/20/2008  Job:  [id number]

## 2011-01-14 NOTE — Consult Note (Signed)
NAME:  John Cox, WEYGANDT NO.:  [id number]   MEDICAL RECORD NO.:  [id number]          PATIENT TYPE:  INP   LOCATION:  6743                         FACILITY:  [facility]   PHYSICIAN:  Jordan Hawks. Elnoria Howard, MD    DATE OF BIRTH:  1970-09-06   DATE OF CONSULTATION:  04/27/2007  DATE OF DISCHARGE:                                 CONSULTATION   REASON FOR CONSULTATION:  Left upper quadrant pain.   HISTORY OF PRESENT ILLNESS:  This is a 40 year old gentleman with a past  medical history of antiphospholipid syndrome, diabetes with DKA, who was  admitted to the hospital with complaints of left upper quadrant  abdominal pain that started acutely 10-11 days ago.  The patient states  that the pain is constant and dull in nature.  However, there are acute  exacerbations of pain especially when he has p.o. intake.  The initial  episode of the abdominal pain was associated with diarrhea, nausea, and  vomiting.  However, those issues have subsided.  The patient denies  having any hematemesis, melena, or hematochezia.  He did note having  some red stool, but his Hemoccult has been negative.  There is no  complaint of any dysphagia, and in the past he had a portal vein  thrombosis from his antiphospholipid syndrome. The patient has been  noncompliant with his medications and not taking Coumadin.  Current  workup is negative for an intestinal ischemia, and the preliminary  review of his CT angio is negative for any new thrombosis.  Additionally, the patient is reported to have a recannulization of his  portal pain thrombosis.  The patient does report that his pain is  improved at this time while being on n.p.o. status.   ALLERGIES:  NO KNOWN DRUG ALLERGIES.   PAST MEDICAL HISTORY AND PAST SURGICAL HISTORY:  As stated above.   MEDICATIONS:  Insulin, heparin, Coumadin, and Protonix, as well as  morphine.   SOCIAL HISTORY:  The patient is significant for tobacco use and some  illicit drug use  with cocaine.   FAMILY HISTORY:  Noncontributory.   REVIEW OF SYSTEMS:  As the above in the history of present illness,  otherwise negative.  He also does report having some mild numbness and  dizziness, urinary frequency, and mild weight loss.   PHYSICAL EXAMINATION:  VITAL SIGNS:  Blood pressure is 117/70, heart  rate 67, respirations 20, temperature is 97.8, pulse oximetry is 98% on  room air.  GENERAL:  The patient is in no acute distress, alert and oriented.  HEENT:  Normocephalic, atraumatic.  Extraocular muscles intact.  Pupils  equal, round, and reactive to light.  NECK:  Supple.  No lymphadenopathy.  LUNGS:  Clear to auscultation bilaterally.  CARDIOVASCULAR:  Regular rate and rhythm.  ABDOMEN:  Flat, soft.  Mild tenderness in the left upper quadrant  region, to the left side.  No rebound or rigidity.  Positive bowel  sounds.  EXTREMITIES:  No clubbing, cyanosis. or edema.   LABORATORY VALUES:  White blood cell count is 4.1, hemoglobin 12.9,  platelets at 281.  Sodium  was 136, potassium 4.2, chloride 102, CO2 26,  BUN 15, creatinine 1, glucose is 377, AST 21, ALT 25, alk phos 84, total  bilirubin 1, lipase is 3.6, albumin 3.3.   IMPRESSION:  Left upper quadrant abdominal pain.  His symptoms can be  consistent with peptic ulcer disease.  However, he is heme negative, and  his hemoglobin is stable at this time.  Further evaluation with an EGD  is the next step for further workup.  Apparently, his CT angio is  negative for any new thrombosis as a preliminary report.  Additionally,  physical examination is negative for any splenic friction rub. Another  consideration can be a splenic thrombosis.   PLAN AT THIS TIME:  To perform EGD, and further recommendations pending  the findings.      Jordan Hawks Elnoria Howard, MD  Electronically Signed     PDH/MEDQ  D:  04/27/2007  T:  04/28/2007  Job:  [id number]

## 2011-01-14 NOTE — Consult Note (Signed)
NAME:  John Cox, John Cox NO.:  [id number]   MEDICAL RECORD NO.:  [id number]          PATIENT TYPE:  INP   LOCATION:  6704                         FACILITY:  [facility]   PHYSICIAN:  Antonietta Breach, M.D.  DATE OF BIRTH:  18-Dec-1970   DATE OF CONSULTATION:  06/10/2007  DATE OF DISCHARGE:  06/10/2007                                 CONSULTATION   Mr. Krapf has returned to his baseline of normal interest.  He has  constructive future goals.  He is not having any thoughts of harming  himself or others.  He is not having any hallucinations.   On review of his history, he did use cocaine last week.  This was  followed by period of depressed mood and some mild auditory  hallucinations.  Mr. Durkee has been demonstrating normal behavior today.  He has been cooperative with staff.  He has expressed that he is not  interested in an inpatient psychiatric program.   MENTAL STATUS EXAM:  Mr. Haugen is alert.  He is oriented to all spheres.  Speech within normal limits.  Thought process logical, coherent, goal-  directed.  No looseness of associations.  Thought content:  No thoughts  of harming himself.  No thoughts of harming others.  No delusions, no  hallucinations.  Memory within normal limits.  Concentration within  normal limits.  Affect broad and appropriate.  Judgment intact.   ASSESSMENT:  AXIS I:  1.  Substance induced mood disorder.   Polysubstance abuse including cannabis and cocaine.   Mr. Evett is not at risk to harm himself or others.  He agrees to call  emergency services immediately for any thoughts of harming himself,  thoughts of harming others or hallucinations or other psychiatric  emergency symptoms.   Mr. Kolenda declines inpatient chemical dependency rehabilitation and he  is not committable now that he is recovered from his symptoms.   RECOMMENDATIONS:  [facility]  and 12-step groups.      Antonietta Breach,  M.D.  Electronically Signed     JW/MEDQ  D:  06/10/2007  T:  06/11/2007  Job:  [id number]

## 2011-01-14 NOTE — Consult Note (Signed)
NAME:  BRYLON, BRENNING NO.:  [id number]   MEDICAL RECORD NO.:  [id number]          PATIENT TYPE:  INP   LOCATION:  6704                         FACILITY:  [facility]   PHYSICIAN:  Antonietta Breach, M.D.  DATE OF BIRTH:  1971/03/09   DATE OF CONSULTATION:  06/08/2007  DATE OF DISCHARGE:                                 CONSULTATION   REPORT TITLE:  IN-PATIENT CONSULTATION REPORT - PRIORITY   REASON FOR PRIORITY:  This dictation is priority because of the need to  facilitate expeditious transfer to another hospital.   REQUESTING PHYSICIAN:  Madaline Guthrie, MD   DATE OF CONSULTATION:  03/11/2007   REASON FOR CONSULTATION:  Suicide attempt with overdose.   HISTORY OF PRESENT ILLNESS:  Mr. Heliodoro Domagalski is a 40 year old male  admitted to the [facility] on June 07, 2007 after an overdose  with Tylenol.   Mr. Schanz does acknowledge that this was a suicide attempt.  He has a  depressed mood, __________  hopelessness, helplessness, anhedonia,  difficulty concentrating.  He also has been having auditory  hallucinations.   PAST PSYCHIATRIC HISTORY:  Mr. Hank has not had any psychotropic  medication.   FAMILY PSYCHIATRIC HISTORY:  Mr. Patel brother has been diagnosed with  schizophrenia and is maintained on several medications.   SOCIAL HISTORY:  Single.   PAST MEDICAL HISTORY:  Tylenol overdose.   REVIEW OF SYSTEMS:  PSYCHIATRIC:  The patient denies any history of  elevated mood or decreased need for sleep, except when on illegal  stimulant.   MENTAL STATUS EXAM:  Mr. Oblinger is alert, his affect is constricted, his  mood is depressed.  He is oriented in all spheres.  His memory is intact  to immediate, recent, and remote.  Thought content, he acknowledges  suicidal intent.  He has thoughts on hopelessness and helplessness.  His  judgment is impaired, his insight is partial.  His thought process is  logical, coherent, and goal directed.  No looseness of  associations.   ASSESSMENT:  Axis I:  Rule out major depressive disorder recurrent with  psychotic features.  Axis II:  Deferred.  Axis III:  Tylenol overdose.  Axis IV:  Primary support group.  Axis V:  30   RECOMMENDATIONS:  Would admit to a psychiatric unit when medically  cleared.  Psychotropics deferred.  [employer]ate investment banker.      Antonietta Breach, M.D.  Electronically Signed     JW/MEDQ  D:  06/09/2007  T:  06/09/2007  Job:  [id number]

## 2011-01-14 NOTE — Discharge Summary (Signed)
NAME:  ARTHA, CHIASSON NO.:  [id number]   MEDICAL RECORD NO.:  [id number]          PATIENT TYPE:  INP   LOCATION:  6743                         FACILITY:  [facility]   PHYSICIAN:  C. Ulyess Mort, M.D.DATE OF BIRTH:  1970/12/23   DATE OF ADMISSION:  04/26/2007  DATE OF DISCHARGE:  05/03/2007                               DISCHARGE SUMMARY   STAT PRIORITY DISCHARGE SUMMARY   DISCHARGE DIAGNOSES:  1. Nausea, vomiting, and abdominal pain.  2. History of portal venous thrombosis.  3. Diabetes mellitus, type 1.  4. Antiphospholipid antibody syndrome.  5. 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  6. Polysubstance abuse.  7. Depression with history of suicide attempt.   DISCHARGE MEDICATIONS:  1. Coumadin 12.5 mg on the day following discharge, then 10 mg the      following day, then 12.5 mg on the following day.  2. Reglan 5 mg by mouth twice daily for two weeks.  3. NovoLog 70/30 mix insulin:  Inject 35 units in the morning and 30      units in the evening.  4. Sliding scale insulin per home regimen.  5. Percocet 10/325 mg:  One tablet every 8 hours as needed for pain.   DISPOSITION AND FOLLOWUP:  The patient is to follow up with Dr. Alexandria Lodge in  the [facility] on May 06, 2007.  He is also to call the  [facility] at [phone] to schedule a followup  appointment.  During those visits, the patient will need a CBC and an  INR checked.   PROCEDURES PERFORMED DURING THIS HOSPITALIZATION:  1. A plain film of the abdomen on April 26, 2007, demonstrated a      nonspecific bowel-gas pattern with no acute abnormalities.  2. A CT with angiography of the abdomen and pelvis revealed      recannulization of the portal venous system compared to prior      examinations.  There was no evidence to suggest bowel infarction.  3. A plain film of the chest on April 27, 2007, demonstrated no acute      cardiopulmonary disease.  4. EGD on April 27, 2007, demonstrated low-grade esophagitis, several      small ulcers within the gastric body, and no evidence of active      bleeding.   CONSULTATIONS:  Dr. Jeani Hawking of Gastroenterology.   ADMISSION HISTORY:  Mr. Marsalis is a 40 year old, African-American  gentleman with a history of antiphospholipid antibody syndrome as well  as prior bilateral deep vein thromboses and pulmonary embolus as well as  portal vein thrombosis, who presented to the [facility] on August 25th complaining of left upper quadrant pain.  He  reported experiencing this pain for approximately two weeks and that it  had become progressively severe.  He endorsed one day of nausea,  vomiting, and a single episode of diarrhea two weeks prior to admission  when the pain began.  He denied any hematemesis, hematochezia, melena,  or hemoptysis.  The pain seems to be worse shortly after food intake.  The only thing that makes the pain better is Vicodin.  He denies any  fever, chills, but does endorse a six-pound weight loss (subjective) in  the last 10 days prior to admission.  He did endorse using nonsteroidal  antiinflammatory drugs for pain relief without much additional effect.   ADMISSION PHYSICAL:  VITAL SIGNS ON ADMISSION WERE:  Temperature 98.9  degrees Fahrenheit, blood pressure 122/66, pulse 83, respiration rate  20, oxygen saturation 98% on room air.  GENERAL:  No acute distress.  Well-nourished.  HEENT:  Pupils equal, round, regular, reactive to light and  accommodation, extraocular movements intact, sclerae anicteric.  Oropharynx clear and without erythema, swelling, or exudate.  NECK:  Supple and without lymphadenopathy or thyromegaly.  CARDIOVASCULAR:  Regular rate and rhythm, grade 2/6 systolic ejection  murmur at the left upper sternal border, no rubs or gallops.  RESPIRATORY:  Clear to auscultation  bilaterally.  GI:  Soft.  Mild to moderate left upper quadrant tenderness without  rebound or guarding.  There is also diffuse mild tenderness throughout.  Normoactive bowel sounds.  EXTREMITIES:  No clubbing, cyanosis, or edema.  SKIN:  No rashes.  MUSCULOSKELETAL:  Strength 5/5 in all extremities.  Sensation grossly  intact.  PSYCH:  Alert and oriented x3.  Appropriate affect.   ADMISSION LABS:  Laboratory studies on the day of admission revealed:  Sodium 137, potassium 4.2, chloride 99, bicarbonate 32, BUN 18,  creatinine 0.99, glucose 306.  Calcium was 9.0.  AST 21, ALT 25,  alkaline-phosphatase 84, total bilirubin 1.0.  Total protein was 5.7.  Albumin was 3.3.  Hemoglobin was 12.6, hematocrit 37.2, white blood cell  count 3.9 with an ANC of 2.0, platelet count 287.  The MCV was 84.1 and  RDW was 14.5.  Protime was 12.8 with an INR of 0.9.  PTT was 23.  Lactic  acid was 1.3.  Alcohol level was less than 5 mg/dl.  Lipase was 36.  The  first cardiac panel drawn on the day of admission revealed CK 165, CK-MB  1.9, troponin-I 0.02 with no indication of myocardial injury.   HOSPITAL COURSE BY PROBLEM:  1. Abdominal pain.  The differential diagnosis for the patient's      abdominal pain included common causes such as gastritis/peptic      ulcer disease, appendicitis, pancreatitis, cholecystitis, colitis,      or a pneumonia with irritation of the diaphragm.  The patient's low      white blood cell count, lack of fever, normal lipase, and normal      chest x-ray argued against several of these diagnoses.  Given that      the patient was afebrile and had no leukocytosis, or right lower      quadrant abdominal pain, acute appendicitis was felt to be      unlikely.  Also given that the patient's lipase and lactic acid      levels were normal, acute pancreatitis or fulminant ischemic      colitis were felt to be unlikely.  This was supported by at least      one negative stool guaiac.   Given the patient's history of      antiphospholipid antibody syndrome with bilateral DVTs and      pulmonary embolus as well as portal vein thrombosis in March 2008,      recurrent clot with an associated intestinal ischemia was of  serious concern.  However, CT angiography did not reveal an acute      clot in the ischemic vasculature, actually revealing      recannulization of the portal vein.  Given the absence of an acute      clot on imaging but that the patient was also subtherapeutic with      an INR of 0.9, he was begun on heparin and bridged to Coumadin.      Note that the patient did continue to have abdominal pain      throughout his hospitalization, but it did improve by the day of      discharge.  Given that he continued to have abdominal pain on the      day following admission, an EGD was performed revealing only a few      small ulcers within the gastric body.  The patient was started on      Sucralfate in addition to his Protonix.  Without any evidence of      acute clot by imaging or other causes of abdominal pain, we      attributed the patient's residual pain to likely a very small clot      within the intestinal vasculature.  The goal during this      hospitalization was to restart his Coumadin, which he had stopped      taking, and decrease the likelihood of another clotting event.  2. Antiphospholipid antibody syndrome.  See Problem #1.  Given the      patient's noncompliance with Coumadin at home and his      subtherapeutic INR on admission, he was started on heparin and      bridged to Coumadin to achieve a therapeutic INR.  3. Diabetes mellitus, type 1.  The patient was continued on his home      dose of NovoLog 70/30 during this hospitalization with sliding      scale insulin coverage.  He was discharged home on his home dose of      NovoLog 70/30.  4. Nausea and vomiting.  While the patient reported nausea and      vomiting prior to admission, he did not  have any of these events      while in the hospital.  His p.o. intake of solid food and liquid      was good.  5. Pain.  The patient continued to complain of abdominal pain      throughout this hospitalization, though it did improve during his      stay.  He was initially treated with Ultracet, but eventually      changed to Percocet after the Ultracet failed to relieve his pain.  6. Polysubstance abuse.  The patient admitted to a prior history of      frequent marijuana, alcohol, and cocaine use.  However, his urine      drug screen on April 29, 2007, was negative for all drugs except      for opioids.  He was counseled against using drugs in the future.   DISCHARGE LABS:  Laboratory studies on the day of discharge revealed:  Hemoglobin 12.7, hematocrit 37.6, white blood cell count 3.7, platelet  count 253.  Protime on the day of discharge was 25.1 seconds, with an  INR of 2.2.   DISCHARGE VITAL SIGNS:  Vital signs on the day of discharge were:  Temperature 98 degrees Fahrenheit, blood pressure 112/71, pulse 57,  respiration rate 16, oxygen saturation 97%  on room air.      Madelaine Etienne, MD  Electronically Signed      C. Ulyess Mort, M.D.  Electronically Signed    JH/MEDQ  D:  06/09/2007  T:  06/09/2007  Job:  [id number]   cc:   Jordan Hawks. Elnoria Howard, MD

## 2011-01-14 NOTE — Discharge Summary (Signed)
NAME:  John Cox, John Cox NO.:  [id number]   MEDICAL RECORD NO.:  [id number]          PATIENT TYPE:  INP   LOCATION:  5532                         FACILITY:  [facility]   PHYSICIAN:  Acey Lav, MD  DATE OF BIRTH:  [birth year]   DATE OF ADMISSION:  06/18/2008  DATE OF DISCHARGE:  06/20/2008                               DISCHARGE SUMMARY   DISCHARGE DIAGNOSES:  1. Suicide attempt - overdose on lisinopril, Coumadin, and tramadol,      stable on discharge, no suicidal ideations.  2. Increased anion gap - unclear etiology, likely secondary to      combination of medication overdose, resolved on discharge.  3. Chest pain - secondary to cocaine abuse, resolved on discharge.  4. Diabetes mellitus, type 1 - on insulin.  5. Transaminitis - unclear etiology, mild, likely secondary to      medication overdose.  6. Polysubstance abuse (cocaine, alcohol, and tobacco).  7. History of suicide attempts.  8. History of blood clots secondary to antiphospholipid syndrome,      diagnosis made in 2006.   DISCHARGE MEDICATIONS:  1. Lovenox 75 mg subcu twice daily for 3 days.  2. Coumadin 5 mg once daily.  3. Insulin 70/30, 45 units in the morning and 50 units in the evening.   DISPOSITION AND FOLLOWUP:  The patient was discharged from the unit in  stable condition with no active suicidal ideations.  He will have  followup appointment with Dr. Audria Nine in [facility] on July 04, 2008.  On followup appointment, please evaluate if the patient is  suicidal and if he went to [facility] (appointment was scheduled on  June 27, 2008, for psychiatric evaluation).  Also, please evaluate  for abstinence from alcohol, smoking, and cocaine.  Please note, if he  continues to experience chest pain.  Can check BMET, since on admission  increased anion gap (17) and increased BUN, both resolved on discharge.  Can also check PT and INR since the patient is on Coumadin.  The patient  was  given enough medications until the appointment day since concern was  for possible recurrent suicide attempt.  During the hospitalization, we  temporarily held lisinopril and the patient did well in terms of blood  pressure control, etiology unclear if the patient still needs to be on  lisinopril.  We did not give prescription on discharge.  Can also check  LFTs since on admission mild transaminitis was noted with increased AST  of 49 and increased bilirubin of 2.9.   PROCEDURES PERFORMED:  None.   CONSULTATION:  Antonietta Breach, MD   HISTORY OF PRESENT ILLNESS:  The patient is a 40 year old male with past  medical history of diabetes type 1, hypertension, polysubstance abuse,  blood clots since 2006, suicide attempt, who presented to ED on morning  of June 18, 2008, for drug overdose.  He reports taking lisinopril,  Coumadin, tramadol, and cocaine early that morning on day of admission  (total of over 100 pills).  He was brought in by police to ED.  Explains  that reason why he took the  medication is due to depression about his  social life and inability to stop using cocaine.  His girlfriend was  with him at the time of overdose but has not witnessed the patient using  any medications.  This is the patient's second attempt of committing  suicide.  Reports continuous nonbloody vomiting, associated with sore  throat.  Denies diarrhea or blood in stool, no abdominal or urinary  complaints, no hematuria or other bleeding.  The patient also reports  continuous headaches, blurry vision, no double vision, no weakness or  numbness.  No fever, no chills.  Some shortness of breath.  The patient  also reports continuous chest pain, substernal, 4/10 in severity,  nonradiating, dull, no specific aggravating or alleviating factors.   PHYSICAL EXAMINATION:  VITAL SIGNS:  On admission, temperature 96.7,  blood pressure 152/91, pulse 86, respirations 18, and saturating 94% on  room air.   GENERAL:  The patient is lying in bed, appeared drowsy, not in acute  distress.  HEENT:  Eyes nonicteric sclera, no conjunctival pallor, extraocular  movements intact.  ENT, no pharyngeal erythema.  No signs of infection.  Poor oral dentition.  NECK:  Supple.  No lymphadenopathy.  No thyroid enlargement.  LUNGS:  Clear to auscultation bilaterally, poor inspiratory effort, no  wheezing, and no crackles.  CARDIOVASCULAR:  Regular rate and rhythm.  No murmurs, no rubs, no  gallops.  S1 and S2 present.  No JVD.  ABDOMEN:  Nondistended, slight epigastric tenderness, bowel sounds  hypoactive.  No guarding.  No rebound tenderness.  EXTREMITIES:  Warm and well perfused.  No edema and no cyanosis.  SKIN:  No petechiae, no signs of jaundice, no bruises.  LYMPHADENOPATHY:  No evidence of lymphadenopathy.  MUSCULOSKELETAL:  Full range of motion in all 4 extremities.  No joint  stiffness or effusions.  NEUROLOGIC:  Alert and oriented x3.  A 5/5 strength in all 4  extremities.  Sensation intact to soft touch throughout.  Normal finger-  to-nose and heel-to-shin.  Cranial nerves II-XII are grossly intact.  PERRLA.  PSYCH:  Drowsy, lethargic, however, cooperative to examination.   LABORATORY:  Sodium 137, potassium 5.1, chloride 97, bicarb 23, BUN 29,  creatinine 1.46, glucose of 245, and calcium 9.7.  White blood cells  9.3, ANC 6.8, hemoglobin 15.6, MCV 87.1, and platelets 264.  LFTs within  normal limits except AST 46.  Bilirubin 2.9.  Anion gap 17.  PT 13.6,  INR 1.0, PTT 21.  UDS, positive for cocaine.  ABGs within normal limits.   HOSPITAL COURSE:  1. Suicide attempt:  Considering that the patient possibly took about      120 pills, combination of lisinopril, Coumadin, and tramadol, we      immediately obtained blood work, and we were monitoring for      occurrence of side effects associated with each medicine overdose.      No electrolyte abnormalities were noted on BMET (except increased       anion gap).  No creatinine abnormalities, potassium was slightly on      high end of normal (5.1) which can be possibly explained by      lisinopril, resolved by administration of Kayexalate.  Magnesium      within normal limits.  No specific findings on CBC, PT and INR      within normal limits.  The patient's blood pressure remained within      the target range.  He has not developed any bleeding during  the      course of hospitalization (no purple toe syndrome, no cholestatic      jaundice, and cholesterol emboli).  The patient maintained oxygen      saturation between 96 and 100% on room air.  On discharge, the      patient to that he might not have taken that many medicines and      could not remember exactly what he took.  On UDS, positive for      cocaine and THC.  Lovenox was started, and the patient tolerated it      well.  Psych consult was done by Dr. Jeanie Sewer.  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  Dr. Jeanie Sewer recommended outpatient      therapy in [place].  2. Increased anion gap:  Initially thought to be due to DKA, Tylenol      or aspirin overdose, however, no metabolic acidosis noted on      admission labs, glucose was 245 on admission, lactic acid was      within normal limits.  In addition, salicylates and Tylenol levels      were within normal limits.  Increased anion gap resolved on      discharge.  3. Chest pain:  This was secondary to cocaine abuse.  Cardiac enzymes      x3 were negative.  On a  12-lead EKG, there was normal sinus rhythm      at 81 beats per minute, left ventricular hypertrophy noted, normal      axis deviation, big T waves in V3-V6.  Chest pain resolved on      discharge.  4. Transaminitis - unclear etiology, mild, ASC equals 46 and bilirubin      equals 2.9, possibly related to medication overdose.  Heparin panel      negative, might have to be repeated on  followup appointment.  5. Diabetes:  Hemoglobin A1c equals 10.5, no significant change since      last visit 2 months ago.   DISCHARGE LABORATORY AND VITAL SIGNS:  Blood pressure 123/76,  temperature 97.5, pulse 68, respirations 20, and saturating 97% on room  air.   Sodium 137, potassium 3.7, chloride 100, bicarb 28, BUN 10, creatinine  1.11, and glucose 240.  White blood cells 4.6, hemoglobin 13.1, and  platelets 199.  PT 19.8, INR 1.6, and calcium 8.5.      Mliss Sax, MD  Electronically Signed      Acey Lav, MD  Electronically Signed    IM/MEDQ  D:  06/28/2008  T:  06/28/2008  Job:  [id number]   cc:   Antonietta Breach, M.D.  Maurice March, M.D.

## 2011-01-14 NOTE — Discharge Summary (Signed)
NAME:  John Cox, BURSTEIN NO.:  [id number]   MEDICAL RECORD NO.:  [id number]          PATIENT TYPE:  INP   LOCATION:  6704                         FACILITY:  [facility]   PHYSICIAN:  Joaquin Courts, MD     DATE OF BIRTH:  Nov 25, 1970   DATE OF ADMISSION:  06/07/2007  DATE OF DISCHARGE:  06/10/2007                               DISCHARGE SUMMARY   DISCHARGE DIAGNOSES:  1. Suicide attempt with overdose of multiple medications to include      tylenol.  2. Diabetes mellitus type 1.  3. Antiphospholipid antibody syndrome.  4. History of deep vein thrombosis, pulmonary embolus and portal vein      thrombosis.  5. Polysubstance use to include alcohol, nicotine and cocaine.   DISCHARGE MEDICATIONS:  1. Lovenox 80 mg subcutaneously x1 day.  2. Coumadin 8 mg q.daily.  3. NovoLog 70/30 thirty-five units in the a.m., 30 units in the p.m.  4. Sliding-scale insulin home regimen.   DISPOSITION AND FOLLOWUP:  The patient is being discharged to home.  The  patient is to follow up with Dr. Michaell Cowing in his Coumadin clinic in two  weeks to have a PT/INR checked.   PROCEDURES:  No procedures were performed during this hospitalization.   CONSULTATIONS:  Psychiatry was consulted and recommended inpatient  psychiatric treatment however, it was felt safe for the pt to go home  given he was unable to get sponsorship for inpatient psychiatric  treatment.   ADMITTING HISTORY AND PHYSICAL:  Patient is a 40 year old gentleman with  a history of diabetes mellitus type 1 and antiphospholipid antibody  syndrome, presenting to the emergency room approximately four hours  after ingesting 80 to 100 pills.  He is unsure of what the pills were,  but he thinks that at least one of the pills were Tylenol.  He reports  leaving a suicide note for his mother.  He had no physical complaints  prior to his overdose.  On presentation, he was complaining of  significant global abdominal pain.   ADMISSION  VITALS:  Temperature 97.8, blood pressure 124/70, pulse 83,  respiratory rate 22, O2 saturation 99% on room air.   ADMISSION PHYSICAL EXAMINATION:  GENERAL:  Generally, patient was very  somnolent, answered questions appropriately, however difficult to arouse  at times.  EYES:  Pupils were sluggish, but equally reactive and round.  RESPIRATORY:  Bilateral expiratory wheezing.  Good air movement.  CARDIOVASCULAR:  Regular rate and rhythm.  No murmurs, rubs or gallops.  ABDOMINAL EXAM:  Good bowel sounds.  Soft, tender to palpation globally,  greater in periumbilical area.  No rebound or no guarding.  NEURO:  Patient was able to move all extremities without difficulty.  He  was hyperreflexic in patellar reflexes, otherwise normal exam.  PSYCHIATRIC EXAM:  Patient was depressed and somnolent and unable to  ascertain a good psychiatric evaluation at this time.   ADMISSION LABS:  B-Met:  Sodium 136, potassium 3.8, chloride 96, bicarb  21, BUN 17, creatinine 1.73, glucose 396.   Liver enzymes:  Bilirubin 2.6, alkaline phosphatase 70, AST 33, ALT 32,  protein 6.4, albumin 3.7, calcium 9.5.   CBC:  White blood cell count 6.2 with an absolute neutrophil count of  3.8, hemoglobin 12.8, platelets 275 and MCV 83.9.   Alcohol level less than 5.  PT/INR 13.9-1.1, PTT 20.  Urine drug screen  positive for cocaine, salicylate level 11.5 and acetaminophen level  114.9.   Urinalysis:  Urine glucose was greater than 1000, urine ketones greater  than 80 and negative for nitrites or leukocyte esterase.   HOSPITAL COURSE:  1. Suicide attempt with overdose.  The patient became more arousable      during the hospitalization.  IV fluids and N-acetylcysteine were      administered because we were unclear as to what time he ingested      Tylenol.  The first acetaminophen level that was rechecked had      increased to 120 from 114, but then was rechecked after the N-      acetylcysteine was started to be  less than 10.  The patient was      subsequently discontinued off the N-acetylcysteine and remained      stable throughout the hospitalization.  Psychiatry was consulted      and recommended inpatient psychiatric treatment.  The patient was      agreeable to this but was unable to get financial sponsorship and      it was felt that the pt was safe to go home.  He denies suicidal      and homicidal ideation at the time of discharge.  2. Diabetes mellitus type 1.  Patient's hemoglobin A1c was checked      during this hospitalization and was 10.5.  He reports not taking      all of his insulin as an outpatient.  During his hospitalization,      his CBGs were elevated and his dose of insulin was subsequently      increased with the help of diabetes team here.  He was discharged      on his home insulin regimen.  3. Antiphospholipid antibody syndrome.  Patient did not have any signs      or clotting while here.  His INR was subtherapeutic on admission.      Therapeutic Lovenox was started.  Coumadin was started as well per      pharmacy and at discharge patient was therapeutic with an INR of      2.1.  There were no signs of clotting during this hospitalization      and patient will need to take one additional Lovenox injection on      discharge.  4. History of DVT, PE and portal vein thrombosis.  The patient was      restarted on Coumadin and bridged with therapeutic Lovenox.  5. Polysubstance use to include cocaine, nicotine and alcohol.  The      patient was started on thiamine and folic acid and put on a CIWA      protocol.  He did not have any signs of withdrawal during this      hospitalization.   DISCHARGE VITALS:  Temperature 97.8, blood pressure 131/79, pulse 57,  respiratory rate 20, O2 saturation 98% on room air.  CBG was 117.   DISCHARGE LABS:  Hepatic function panel showed a bilirubin of 1.6, a  direct of 0.4 and an indirect bilirubin of 1.2, AST 33, ALT 31, albumin   3.1.      Joaquin Courts, MD  Electronically Signed  VW/MEDQ  D:  06/10/2007  T:  06/10/2007  Job:  [id number]

## 2011-01-17 NOTE — Discharge Summary (Signed)
NAME:  John Cox, John Cox NO.:  [id number]   MEDICAL RECORD NO.:  [id number]          PATIENT TYPE:  INP   LOCATION:  1414                         FACILITY:  [facility]   PHYSICIAN:  Melissa L. Ladona Ridgel, MD  DATE OF BIRTH:  1971/04/23   DATE OF ADMISSION:  05/31/2006  DATE OF DISCHARGE:  06/03/2006                                 DISCHARGE SUMMARY   CHIEF COMPLAINT ON ADMISSION:  Vomiting.   DISCHARGE DIAGNOSES:  1. Diabetic ketoacidosis secondary to noncompliance with medications. The      patient was admitted, placed on Glucomander, IV rehydrated and then      resumed on his Lantus 25 units daily with regular sliding scale insulin      at discharge. He is to follow up in the outpatient office at the [facility] as per Dr. Idelle Crouch note.  2. History of deep vein thrombosis. The patient is noncompliant with this      Coumadin. He was discharged to home with a subtherapeutic INR on      Coumadin and to follow up as an outpatient on Friday post discharge for      his INR check. The patient was to take Coumadin 10 mg daily. Dr Nehemiah Settle      was aware at the time of discharge of his subtherapeutic INR and      cleared him for discharge to follow up as an outpatient. Please note      that the patient's history of deep vein thrombosis is secondary to      antiphospholipid antibody syndrome diagnosed in December 2006.  3. Polysubstance abuse. The patient was counseled against this and started      on an multivitamin, thiamine and folate during the course of the      hospital stay.  4. History of depression. No intervention was made during the course of      this hospital stay with regard to his depression.   MEDICATIONS AT THE TIME OF DISCHARGE:  1. Lantus 25 units daily.  2. Regular insulin sliding scale.  3. Coumadin 10 mg.   HOSPITAL COURSE:  The patient is a 40 year old African-American male who  presented to the hospital with vomiting and vague abdominal  pain and was  found to be in diabetic ketoacidosis. The patient was admitted to the  general medical floor and started on the Glucomander insulin protocol as  well as actively rehydrated. Chest x-ray was obtained to rule out occult  infection and showed no obvious disease. The patient was started on Lovenox  to bridge to his subtherapeutic INR and Coumadin to reestablish a baseline  INR of 2 to 2.5 for the antiphospholipid syndrome. Diabetes care consult was  requested as was a nutrition consult. The patient's INR remained  subtherapeutic despite large doses of Coumadin. He was cleared for discharge  to home on Coumadin 10 mg to follow up as an outpatient. The patient was  seen and evaluated on the day of discharge by Dr. Renford Dills who felt he  was stable for discharge  to follow up with the [facility]  and gave clearance for discharge home. It appears that his chest was clear.  Cardiovascular was regular. His vital signs were stable. He was afebrile.   PERTINENT LABORATORY VALUES:  Revealed sodium of 141, potassium of 3.3,  chloride 106, CO2 30, glucose 92, BUN 9, creatinine 1.0. His hemoglobin A1c  was 11. His PT/INR at discharge was 19.9 with an INR of 1.6. His discharge  hemoglobin was 12.8 with a 37.8 hematocrit.   As per the discharging physician's note, the patient was stable with  disposition to home and follow up with the [facility].      Melissa L. Ladona Ridgel, MD  Electronically Signed     MLT/MEDQ  D:  06/27/2006  T:  06/27/2006  Job:  [id number]

## 2011-01-17 NOTE — Discharge Summary (Signed)
NAME:  GROVE, DEFINA NO.:  [id number]   MEDICAL RECORD NO.:  [id number]                   PATIENT TYPE:  INP   LOCATION:  5017                                 FACILITY:  [facility]   PHYSICIAN:  Rodolph Bong, M.D.                  DATE OF BIRTH:  09-10-1970   DATE OF ADMISSION:  12/17/2002  DATE OF DISCHARGE:  12/19/2002                                 DISCHARGE SUMMARY   PRIMARY CARE PHYSICIAN:  Nurse Practitioner Gore at the [facility] on  [employer].   DIAGNOSES:  1. Hyperglycemia.  2. Type 1 diabetes mellitus.  3. Increased creatinine.  4. Questionable dental abscess.   DISCHARGE MEDICATIONS:  1. Lantus 45 units subcutaneously q.h.s.  2. Amoxycillin 875 mg p.o. b.i.d. x8 more days to complete a 10 day course.   HISTORY OF PRESENT ILLNESS:  The patient is a 40 year old African American  male with a history of type 1 diabetes mellitus who presented to [facility] emergency department with increased blood sugars. He stated he went  to his primary physician at [facility] approximately 2 days ago because of  fatigue, polyuria, blurred vision, nausea, etc, and was found to have an  unreadable blood sugar on the Glucometer. At that time he was told to report  to the emergency department for further treatment but opted to return home  and treat himself.   He continued on his Lantus at home and increased his fluid intake. However,  this included drinking approximately 7 beers in the past 3 days  prior to  admission. While at home he continued to have unreadable blood sugars on his  Glucometer. Finally on the day of admission his friend convinced him to come  to the emergency department.   In the ED his initial CBG was 628. He received approximately 3 liters of  normal saline bolus by IV and 10 units of regular insulin. His capillary  blood glucose values responded well, going from 628 at 4:49 p.m. to 410 at  6:06 p.m. to 295 at 7:27  p.m.   HOSPITAL COURSE:  PROBLEM #1, HYPERGLYCEMIA:  The patient presented with a  history as outlined above. He had been on a home regimen of Lantus which he  states that he had been taking. However, due to a recent incarceration he  stated he was  without long-acting insulin for a few days and was only  treated with sliding scale insulin while in jail. Laboratory values  initially for the patient revealed that he was  not acidotic. He had a pH of  7.388 and a bicarbonate of 30. His anion gap was negative too.   The patient's blood sugars responded very well to IV hydration therapy. He  was discontinued on  his Lantus 50 units at night. However, this gave him  fasting values of 85 and 89 on the 2 days after  admission. He was slightly  tremulous with these readings. Consequently he will be discharged on Lantus  45 units subcutaneously at night.   Throughout the remainder of the day his blood sugar values remained between  89 to 295. This is by no means adequate control, however, with a hemoglobin  A1C recorded of 14.4, this is much improved over his baseline at home. The  diabetes educators met with the patient and explained the importance of  taking his blood sugar and insulin therapy. The idea of 70/30 was discussed,  but it was determined that he stated that he would be more likely to take 1  shot a day than 2 at this time. This can be adjusted as an outpatient if he  responds well to Lantus therapy.   PROBLEM #2, TYPE 1 DIABETES MELLITUS:  This was a prior diagnosis made in  prior hospital stays. He has had 3 to 4 admissions for diabetic  ketoacidosis. He will be continued on Lantus therapy as above.   PROBLEM #3, INCREASED CREATININE:  The patient presented with a creatinine  level of 1.4. However, this was most likely secondary to dehydration and  corrected with fluids. His discharge creatinine level was 0.8 on December 19, 2002.   PROBLEM #4, DENTAL ABSCESS:  The patient  presented with several chipped  teeth. Out of  a posterior left molar there appeared to be a pus-like  drainage. However, he was  found to be afebrile and did not have any  temperature spikes while in the hospital. He did not report any fever or  chills prior to  his admission. In addition his white blood cell count with  a  8.2 and remained stable throughout his hospital stay. While in the  hospital he was instituted on Augmentin therapy and will be discharged on  amoxycillin to finish a 10 day course. He will be instructed to follow up  with a dentist for further evaluation, as he probably needs 1 or 2 teeth  pulled.   DISCHARGE INSTRUCTIONS:  Activity, no restrictions. Diet, the patient was  advised to continue a low carbohydrate and low sugar diet. Special  instructions, the patient was asked to check his blood sugars in the morning  and before each meal. He is to  record these values on a piece of paper and  bring them to his followup appointments. He is advised strongly to take his  Lantus on a daily basis and not just skip days.   FOLLOW UP:  1. Rodolph Bong, M.D. in the outpatient medicine clinics on Thursday, December 22, 2002, at 3 p.m.  2. Nurse Practitioner Emeline Darling on December 28, 2002, at 11:10 a.m.  3. Followup with a dentist of his choice per [facility] recommendations.                                               Rodolph Bong, M.D.    AK/MEDQ  D:  12/19/2002  T:  12/20/2002  Job:  [id number]   cc:   Nurse Practitioner  Burt Ek

## 2011-01-17 NOTE — Consult Note (Signed)
NAME:  John Cox, AMEDEE NO.:  [id number]   MEDICAL RECORD NO.:  [id number]          PATIENT TYPE:  IPS   LOCATION:  0505                          FACILITY:  [facility]   PHYSICIAN:  Hollice Espy, M.D.DATE OF BIRTH:  Nov 28, 1970   DATE OF CONSULTATION:  06/06/2005  DATE OF DISCHARGE:                                   CONSULTATION   REQUESTING PHYSICIAN:  Dr. Geoffery Lyons.   REASON FOR CONSULTATION:  Diabetes management.   HISTORY OF PRESENT ILLNESS:  The patient is a 40 year old African-American  male with past medical history of diabetes mellitus who was admitted on  June 04, 2005 for suicidal behavior including self cutting of his wrists  and stabbing himself.  In regards to his diabetes mellitus, he was diagnosed  approximately 4 years ago.  He has been on insulin for almost all that time  but has been noncompliant although he cannot say when the last time he did  indeed take his insulin.  The patient presented was seen in the evening of  June 04, 2005 and at that time had a previous written dose of Lantus 55  units.  Throughout the course of October 5, his blood sugars were running at  critically high values.  We were asked to consult for diabetes management.  In speaking with the nurse practitioner over at [facility], it was  felt that the patient likely fluctuates secondary to poor compliance, his  increased psychosis and the fact that his Lantus had not fully gone into  effect.  In addition, his sliding scale had previously been started as an  insulin sensitive.  I advised them to increase that to insulin resistant.  Over the course since the patient has been hospitalized his blood sugar at  the time of admission was 374.  On October 5 it was 269 in the morning,  coming down to 124, although during the day on October 5 it was critically  high and this morning it was 255.  Currently the patient states he is doing  well.  He denies any headaches,  vision changes.  He says his vision right  now is okay.  He denies any dysphagia, chest pain, palpitations, shortness  of breath, wheeze, cough, abdominal pain, hematuria, dysuria, constipation,  focal extremity numbness, weakness.  The patient says he has no problems  with numbness of his lower extremities.  His review of systems is otherwise  negative.   PAST MEDICAL HISTORY:  Includes diabetes mellitus and substance abuse and  depression.   MEDICATIONS:  The patient is supposed to be on Lantus 55 units at home but  he has not been taking this.  It has been started here at a lower dose of 50  units.  He is also on a nicotine patch here, trazodone 50, Abilify 10, and a  NovoLog sliding scale, as well as p.r.n. Tylenol, Maalox, antiacids Dulcolax  and glycerine suppositories as well as p.r.n. Librium and Cepacol.   ALLERGIES:  The patient has no known drug allergies.   SOCIAL HISTORY:  He admits to smoking marijuana,  using cocaine, and has been  a tobacco user for the last 20 years.  He denies IV drug abuse.  He does  admit to drinking alcohol.   FAMILY HISTORY:  Known for schizophrenia and drug abuse in a brother.   PHYSICAL EXAMINATION:  The patient's most recent set of vitals:  Temperature  97.3, respirations 24, blood pressure 126/67, heart rate 79.  CBGs are as  mentioned above.  HEENT:  Normocephalic and atraumatic.  Mucous membranes are moist.  He has  no carotid bruits.  HEART:  Regular rate and rhythm, S1, S2 .  LUNGS:  Clear to auscultation bilaterally.  ABDOMEN:  Soft, nontender, nondistended, positive bowel sounds.  EXTREMITIES:  No clubbing, cyanosis or edema.   LABORATORIES:  Thyroid function panel:  Free T4, T3 and TSH are all normal.  Urinalysis is unremarkable.  Tricyclic screen is negative.  Urine drug  screen is noted to be completely normal.  Urinalysis is noted to have  greater than 1000 in glucose, 15 in ketones.  Salicylate, alcohol level,  Tylenol levels  are all normal.  Screening labs on admission:  CMET is  completely normal except for a glucose of 258.  A CBC is also normal except  for slightly elevated hemoglobin at 17.1.   ASSESSMENT AND PLAN:  1.  Diabetes mellitus, poorly controlled in a noncompliant patient.  Would      recommend for starters given the patient's during the day lows and then      highs increasing his nightly dose to 55 units subcutaneous q.h.s.,      continuing his resistant scale.  Also check a hemoglobin A1C to get a      better evaluation.  The patient may be someone who may benefit instead      of once a day Lantus at twice a day Lantus, although compliance      certainly will be an issue.  I am hoping that with treatment of his      psychosis his compliance may improve.  In regards to his psychiatric      issues, will defer of course to Dr. Dub Mikes.  Will continue to follow the      patient and make adjustments accordingly.  In regards to his tobacco      abuse, recommend tobacco counseling as well as his alcohol and drug      problems.  Hopefully here at [facility], he will receive the help      that he needs.      Hollice Espy, M.D.  Electronically Signed     SKK/MEDQ  D:  06/06/2005  T:  06/06/2005  Job:  [id number]   cc:   Geoffery Lyons, M.D.  Fax: [phone]

## 2011-01-17 NOTE — Discharge Summary (Signed)
NAME:  John Cox, John Cox NO.:  [id number]   MEDICAL RECORD NO.:  [id number]          PATIENT TYPE:  INP   LOCATION:  1605                         FACILITY:  [facility]   PHYSICIAN:  Theone Stanley, MD   DATE OF BIRTH:  1971/04/21   DATE OF ADMISSION:  02/03/2005  DATE OF DISCHARGE:                                 DISCHARGE SUMMARY   ADMITTING DIAGNOSIS:  1.  Diabetic ketoacidosis.  2.  Diabetes for 7 years.  3.  History of medical noncompliance.  4.  Polysubstance abuse.   DISCHARGE DIAGNOSES:  1.  Diabetic ketoacidosis, resolved.  2.  Diabetes.  3.  Medical noncompliance.  4.  Urinary tract infection.  5.  Plantar fasciitis.   PROCEDURES:  None.   CONSULTATIONS:  None.   PERTINENT LABORATORY:  A chlamydia and GC probe was performed. Both those  were negative. Last BMET was on June 8 which showed a sodium of 142,  potassium 2.9, chloride at 107, CO2 of 31, glucose of 80, BUN at 6,  creatinine at 0.9, calcium at 9.0. A hand x-ray was performed on June 7  which did not show any foreign objects in his right thumb or other  abnormalities.   HOSPITAL COURSE:  [name] is well known to the service. He presented in  April 2006 with the same symptoms of DKA. He presents again with abdominal  pain and was found to be in DKA. His bicarb was 6, glucose of 530,  creatinine of 2.2, ion gap of 31. The patient was admitted to the ICU,  placed on a Glucommander, his sugars came down nicely. He was started on his  Lantus dose. He did have episodes of hypoglycemia of 59 and 57. However, he  stated that he eats quite a bit more while he is on outpatient basis. We are  unable to change his regimen since he already is noncompliant with his  regimen on the outside. Because of this we will continue this with his home  regimen which is Lantus 50 units subcu q.h.s. and NovoLog sliding scale. He  had quite a bit of dysuria while in the hospital. He started on Cipro for a  urinary  tract infection. His culture did not grow out anything but over the  next couple of days his dysuria improved. He will have a total of 5 days of  the Cipro. The patient also complained of right thumb pain. The x-ray was  performed which was negative. There as no evidence of infection. I  instructed him to watch this and to follow up with his primary care  physician. He also complained of what sounded like plantar fasciitis and I  instructed him that he needs to stretch, possibly change his shoes. I did  not prescription any NSAIDs secondary to his underlying issues. The patient  left the hospital in stable condition.   DISCHARGE DIET:  Diabetic diet.   ACTIVITY:  Increase his activity slowly.   DISCHARGE MEDICATIONS:  1.  Lantus 50 units subcu q.h.s.  2.  Cipro 500 mg one p.o. b.i.d. for additional  3 days.  3.  Protonix 40 mg one p.o. daily for an additional 2 weeks.  4.  NovoLog sliding scale at home dosages.   The patient is to follow up with his primary care physician in 5-7 days.       AEJ/MEDQ  D:  02/06/2005  T:  02/06/2005  Job:  [id number]

## 2011-01-17 NOTE — H&P (Signed)
[place]  Patient:    John Cox, John Cox Visit Number: [id number] MRN: [id number]          Service Type: MED Location: [phone] Attending Physician:  Garnette Scheuermann Dictated by:   Ebbie Ridge, M.D. Admit Date:  02/12/2002   CC:         [facility]   History and Physical  CHIEF COMPLAINT:  Nausea, vomiting, and weakness.  HISTORY OF PRESENT ILLNESS:  Mr. Dismuke is a 40 year old African-American male with a past medical history significant for polysubstance abuse and type 1 diabetes diagnosed in November 2000.  He presents to the emergency room in diabetic ketoacidosis.  He reports he ran out of his insulin about one week ago.  Then two to three days ago he developed nausea, vomiting, and decreased appetite.  He has gotten progressively weaker to the point of being unable to care for himself or administer his insulin at home.  He denies abdominal pain, diarrhea, melena, or bright red blood per rectum.  He has been unable to tolerate p.o.s secondary to vomiting and has noticed some pink streaks in his emesis.  He denies fever or chills.  He denies hematuria or dysuria.  He complains of a slight headache (6/10 pain) and back pain (8/10 pain) currently He is very thirsty and has had decreased urine output at home (increased urine output since IV fluids given in the emergency room this morning).  His last drink of alcohol was Thursday, and he now complains of feeling jittery.  He has no history of DTs or alcohol withdrawal.  PAST MEDICAL HISTORY: 1. Type 1 diabetes mellitus diagnosed in November 2000.  He has had one    hospitalization for DKA since the diagnosis, August 2002.  He has poor    compliance with diabetic diet and medications. 2. Alcoholism, three to four 22-ounce beers per day. 3. Tobacco abuse, one pack per day x15 years. 4. Marijuana and cocaine use.  MEDICATIONS:  Insulin 70/30 20 units b.i.d.  ALLERGIES:  No  known drug allergies.  SOCIAL HISTORY:  The patient lives in his parents home.  He works at [employer] and [employer].  He smokes one pack per day since age 72.  He drinks three to four 22-ounce beers per day.  He uses marijuana daily.  He smokes cocaine once every two to three weeks.  His last use was three days ago.  He is sexually active with multiple male partners.  He does use condoms but has had unprotected sex in the past as well as unprotected oral sex currently.  He denies same-sex intercourse or IV drug use.  FAMILY HISTORY:  Mother is alive with diabetes and hypertension.  Father is alive with diabetes.  The patient does not know whether these are type 1 or type 2.  The patients younger brother is healthy.  REVIEW OF SYSTEMS:  GENERAL:  Positive for weight loss over the past week secondary to decreased intake.  He has had a general weight loss since his diagnosis in 2000.  HEENT:  Positive for headache and vision changes.  He reports seeing spots two days ago.  He denies rhinorrhea or cold symptoms.  He denies tooth pain.  RESPIRATORY:  Denies cough, shortness of breath, or sputum production.  CARDIOVASCULAR:  Denies chest pain.  He does report that his heart has been beating fast.  GASTROINTESTINAL:  Per HPI.  MUSCULOSKELETAL: Denies joint or muscle pain except for the low backache.  GENITOURINARY: Denies dysuria or hematuria.  He does report some occasional white penile discharge.  This has been checked and was negative for gonorrhea and Chlamydia.  PSYCHIATRIC:  Positive for polysubstance addiction.  The patient has a history of a period of being clean from drugs and alcohol for four years, but then he started using again.  He does want rehab.  ENDOCRINE: Poorly compliant with diabetes diet and medications.  Last CBG was checked two weeks ago and was about 179.  PHYSICAL EXAMINATION:  VITAL SIGNS:  Temperature 99.1, blood pressure 133/71, pulse 95, respiratory rate 18,  pulse oximetry 98% on room air.  GENERAL:  This is a thin, pleasant black male in no acute distress.  He is drowsy but oriented x3.  HEENT:  Normocephalic, atraumatic.  Pupils equally round and reactive to light, extraocular movements intact.  No scleral icterus.  No conjunctival injection.  Oropharynx is without erythema or exudate.  Mucous membranes are moist.  The patient has very poor dentition and multiple caries.  NECK:  Supple, no lymphadenopathy, no thyromegaly.  CHEST:  Lungs clear to auscultation bilaterally with good air movement.  CARDIAC:  Regular rate and rhythm without murmur, gallop, or rub.  ABDOMEN:  Soft, nontender, nondistended, with normoactive bowel sounds, no rebound no guarding.  LYMPHATIC:  No inguinal or supraclavicular lymphadenopathy.  SKIN:  No rashes or lesions.  NEUROLOGIC:  Cranial nerves II-XII intact.  Strength is 5/5 and symmetric. Sensation is intact distally.  EXTREMITIES:  No edema.  LABORATORY DATA:  On ABG, pH is 7.22, PCO2 is 29, PO2 112, and bicarb is 12, with O2 saturations 97% on room air.  White blood count is 14.6, 89% neutrophils, 9% lymphocytes, hemoglobin and hematocrit are 16.4 and 49.5, with an MCV of 86.  Platelets are 254.  Sodium is 138, potassium 4.8, chloride 99, bicarb 11, BUN 23, creatinine 1.9, glucose 406, calcium 10.4.  Anion gap is 28.  Total bilirubin is elevated at 2.5, alkaline phosphatase 129, AST 25, ALT 26.  Large serum ketones.  Urinalysis:  Specific gravity 1.039, greater than 1000 glucose, greater than 80 ketones, trace blood, 30 proteins, no nitrite or leukocyte esterase.  ASSESSMENT AND PLAN:  This is a 40 year old African-American male with past medical history of polysubstance abuse and poorly-controlled type 1 diabetes mellitus, who is admitted for diabetic ketoacidosis.  1. Diabetic ketoacidosis.  This appears to be secondary to noncompliance with    insulin as there are no clinical signs or  symptoms of infection.  We will     continue to monitor and follow his leukocytosis to resolution.  If he    spikes a fever, will obtain blood cultures and chest x-ray.  Consider right    upper quadrant ultrasound to rule out cholecystitis with elevated total    bilirubin and alkaline phosphatase but with benign abdominal exam, there is    no indication for that right now.  For now we will aggressively hydrate    with IV fluids and treat with IV insulin drip per Glucommander.  Will    change to D5 half normal saline once glucose under control and then will    start subcu insulin and resume diet once acidosis resolves.  Will monitor    in the step-down unit overnight.  Treat nausea and vomiting with Phenergan    as needed and pain with morphine as needed. 2. Alcoholism.  Patient at a high risk for alcohol withdrawal.  Will start    Librium  protocol for delirium tremens prophylaxis.  Social work consult for    rehab options once his acute issues resolve. 3. Uncontrolled type 1 diabetes.  The patient will need diabetes education,    either inpatient or outpatient arrangements made prior to discharge.  He    has a very poor understanding and insight regarding his disease.  Will    check a hemoglobin a1C. 4. Elevated creatinine.  Hopefully this is prerenal.  Creatinine was 1.1    during hospitalization in August 2002.  Will monitor to resolution with    hydration.  If creatinine is still elevated, this may represent early    diabetic nephropathy.  Yet another reason for aggressive education and    glucose control in this patient. 5. Fluids, electrolytes, and nutrition.  Will add calcium to IV fluids in    anticipation of an intracellular shift once hyperglycemia resolves.  ADA    2200 calorie diet once the patient is taking p.o.s. 6. High-risk sexual behavior.  Will check an HIV status. Dictated by:   Ebbie Ridge, M.D. Attending Physician:  Garnette Scheuermann DD:  02/13/02 TD:   02/15/02 Job: [id number]

## 2011-01-17 NOTE — Discharge Summary (Signed)
NAME:  John Cox, VILLAMAR NO.:  [id number]   MEDICAL RECORD NO.:  [id number]          PATIENT TYPE:  INP   LOCATION:  2041                         FACILITY:  [facility]   PHYSICIAN:  C. Ulyess Mort, M.D.DATE OF BIRTH:  02-05-1971   DATE OF ADMISSION:  08/20/2005  DATE OF DISCHARGE:  08/26/2005                                 DISCHARGE SUMMARY   PRIMARY CARE PHYSICIAN:  [facility]   DISCHARGE DIAGNOSES:  1.  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  Started on chronic Coumadin      therapy.  2.  Right deep venous thrombosis.  3.  Diabetes mellitus type 1 poorly controlled.  Hemoglobin A1c of 12.6 in      December 2006.  4.  Depression with suicidal intent.  Follow-up at [facility]      pending.  5.  History of polysubstance abuse.  UDS negative this time.  Follow-up with      Behavioral Health pending.   DISCHARGE MEDICATIONS:  1.  Lantus insulin 50 units q.h.s.  2.  Sliding scale insulin q.a.c. coverage.  3.  Carbohydrate counting and insulin coverage accordingly.  4.  Vicodin 5/500 p.o. q.6h. p.r.n. for chest pain secondary to PE.  5.  Lovenox 80 mg subcutaneously q.12h.  Last dose on August 26, 2005.  6.  Coumadin 10 mg p.o. daily until August 26, 2005.  7.  Nicotine patch 21 mg q.24h. x6 weeks, then 14 mg daily x4 weeks, then 7      mg two weeks taper off.   DISPOSITION AND FOLLOW-UP:  1.  Patient will follow up in the [facility] for his      repeat PT and INR check and also with Dr. Alexandria Lodge in the [facility].      During his follow-up appointment need to check his PT and INR and adjust      his Coumadin dose accordingly.  Needs to be decided if his Lovenox dose      could be discontinued per Dr. Alexandria Lodge.  Long-term followup for his      diabetes control.  2.  His follow-up with the [facility]al [facility] for depression and      polysubstance abuse.  Patient will also  follow up in the [facility].  Will call      him with appointment once clinic is open after the holidays.  TNA alert      has been sent.   PROCEDURES:  1.  Doppler ultrasound of bilateral legs which was positive for right-sided      deep venous thrombosis in the popliteal vein and posterior tibial vein.      No evidence of superficial thrombosis or Baker's cyst.  No evidence of      any deep venous thrombosis on the left side.  2.  CT scan of the chest on December 21, which is positive for bilateral      pulmonary embolism.   CONSULTS:  No consultations were done during this admission.   HISTORY OF PRESENT ILLNESS:  [name] is a 40 year old African-American  male patient with past medical history significant for type 1 diabetes  mellitus poorly controlled, last hemoglobin A1c 12.6 in December 2006,  recent episode of DKA who presented to the emergency room with one-week  history of progressive increased shortness of breath and chest pain,  especially worsening since 24 hours prior to the admission.  He was recently  discharged from the hospital on 14th of December 2006 for presumed diabetes  mellitus for complaints of chest pain at that time.  He was transferred to  the hospital with a glucose of 591.   PHYSICAL EXAMINATION:  VITAL SIGNS:  Temperature 97.7, blood pressure  112/69, pulse 82, respiratory rate 20, O2 saturation 92% on room air.  GENERAL:  He was resting in the bed without any significant difficulty  breathing.  HEENT:  Eyes:  Pupils equal, reactant to light.  Extraocular movements are  intact.  ENT:  No adenopathy, thyromegaly.  No JVD.  NECK:  Supple.  RESPIRATORY:  Clear to auscultation bilaterally.  Good air movement.  CVS:  Regular rate and rhythm.  No murmurs, gallops appreciated.  GI:  Soft, nontender, nondistended.  Positive bowel sounds.  No  hepatosplenomegaly.  EXTREMITIES:  No edema, but has tenderness to palpation around the right  calf more than the left  calf.  Questionable cords.  No evidence of  superficial lymphedema.  SKIN:  No significant changes except for extremity change.  MUSCULOSKELETAL:  Grossly within normal limits.  NEUROLOGIC:  Nonfocal.   ADMISSION LABORATORIES:  CBC with hemoglobin 12.6, MCV of 83.5, white count  6.5, thrombocytes 242.  BMET:  Sodium 129, potassium 4.1, chloride 105,  bicarbonate 31.3, BUN 13, creatinine 1, glucose 321.  D-dimer 5.07.   UDS negative for cocaine, benzodiazepines, and THC.  UA was negative, showed  glucose of more than 1000 mg, rare bacteria.  __________ scan markers x1  negative.   ASSESSMENT/PLAN:  40 year old African-American male patient with history of  uncontrolled type 1 diabetes mellitus with history of repeated admissions  for diabetic ketoacidosis, polysubstance abuse.  He presented to the ER with  chest pain and shortness of breath.  1.  Bilateral pulmonary embolism.  Given his acute onset of shortness of      breath and chest x-ray being clear, CT scan of the chest was done which      was positive for bilateral pulmonary embolism.  He was also ruled out      for myocardial infarction with cardiac enzymes.  Given his young age and      no obvious risk factors checked hypercoagulability panel which was      indeed positive for antiphospholipid antibody which is most likely      reason for his hypercoagulable status and pulmonary embolism.  He also      had Doppler of the legs which showed deep venous thrombosis.  He started      on heparin and Coumadin and later on switched from heparin to Lovenox      and continued Lovenox until his INR was therapeutic at 2.1 on the day of      discharge.   He will follow in the [facility] with Dr. Alexandria Lodge for repeat  check of the INR.  He is sent home on 10 mg of Coumadin.  Change of his  Coumadin dose needs to be adjusted at the time of his follow-up.  Given his hypercoagulable status with positive antiphospholipid antibody  he probably  needs lifelong anticoagulation.  1.  Right-sided deep venous thrombosis.  Again, treatment per problem #1.      Continue him on Coumadin for anticoagulation.  2.  Diabetes mellitus type 1 poorly controlled.  While in the hospital he      was on Lantus 50 units q.h.s. and moderately sensitive to sliding scale      insulin.  His blood sugars were moderately controlled from high 100s to      low 200s.  He was also given diabetes education and he needs to cover      for his carbohydrate coverage along with sliding scale coverage.      Further __________ needs to be adjusted as an outpatient.  He will also      need to be assigned a new PCP regarding this matter.  His last      hemoglobin A1c was 12.6 in December 2006.  3.  Polysubstance abuse history.  Patient mentioned that he would like to      quit.  Information was given during his last admission in December for      ADA, and he did speak to them and he has a followup appointment at      [facility].  This time his UDS was negative.  4.  Depression history.  Again, he was asked if he needs to be started on      antidepressants but he would prefer to follow up at the behavior center      prior to any medical therapy.   DISCHARGE LABORATORIES:  On the day of discharge his PT was 24, INR was 2.1.  His temperature was 98.2, blood pressure 100/61, pulse 78, respiratory rate  20, O2 saturations 97% on room air.      Judie Grieve, MD    ______________________________  C. Ulyess Mort, M.D.    SY/MEDQ  D:  08/27/2005  T:  08/27/2005  Job:  [id number]   cc:   Alexandria Lodge, M.D.

## 2011-01-17 NOTE — Discharge Summary (Signed)
NAME:  John Cox, John Cox NO.:  [id number]   MEDICAL RECORD NO.:  [id number]                   PATIENT TYPE:  INP   LOCATION:  5506                                 FACILITY:  [facility]   PHYSICIAN:  Lazaro Arms, M.D.        DATE OF BIRTH:  1971-03-27   DATE OF ADMISSION:  09/08/2002  DATE OF DISCHARGE:  09/10/2002                                 DISCHARGE SUMMARY   DISCHARGE DIAGNOSES:  1. Diabetic ketoacidosis, resolved; secondary to uncontrolled diabetes.  2. Uncontrolled diabetes.   DISCHARGE MEDICATIONS:  1. Lantus insulin 45 units subcutaneous q.a.m.  2. Regular insulin 5 units subcutaneous if the patient's glucose is greater     than 250.   ALLERGIES:  NO KNOWN DRUG ALLERGIES.   PROCEDURE:  None.   HISTORY OF PRESENT ILLNESS:  A 40 year old black male with history of type 1  diabetes, presents with a two day history of abdominal pain, nausea and  vomiting.  The patient stopped his insulin about five days ago secondary to  no refills and financial hardship.  The patient denies fever, cough,  dysuria.  Treated with amoxicillin for two weeks treated for a toothache,  which he is now pain free.  Abdominal pain is generalized, without radiation  and constant in nature.  The patient states a history of admission six  months ago for DKA.  The rest of ROS is negative.   Upon evaluation in the emergency department, the patient's pH is 7.2555;  anion gap was 24, serum glucose was 303.  The patient was admitted for  treatment of diabetic ketoacidosis.   HOSPITAL COURSE:  The patient was admitted to the medical floor, provided  with aggressive IV hydration, sliding-scale insulin.  The patient's blood  sugars continued to trend downward during his admission with glucose prior  to breakfast on day of discharge was 115.  The patient was also treated with  Lantus insulin after his first 24 hr in the hospital.  The patient states  that he does not  have a current M.D., but has been seen at [facility] in  the past.   After long discussions about diabetes and the importance of glycemic  control, conducted with the patient both by myself and by Dr. Lazaro Arms, the patient agrees to seek ongoing care at [facility] and to  meet with [facility] diabetes education coordinator.  He appears to be  reasonably intelligent and reliable, and certainly seems motivated to follow  his diabetic regimen as prescribed.  He has provided with the number to  [facility] at the time of discharge.  He has been established with them in  the past.   DISCHARGE LABS:  Hemoglobin A1C is pending at the time of discharge;  although given the patient's history of variable compliance, it is most  likely going to be significantly elevated.  White blood cell count 4.8,  hemoglobin 17,  hematocrit 39.0, platelet count 286,000.  Sodium 134,  potassium 4.1, BUN 10, creatinine 1.1.  Urinalysis on this visit was  negative.  Amylase 51, lipase 16.   CONSULTATIONS:  None.   CONDITION ON DISCHARGE:  Good.   DISPOSITION:  Discharged to home.   FOLLOW UP:  The patient is to follow up with [facility], as noted above,  and he agrees to call them first thing on September 12, 2002.     Ellender Hose. Earlene Plater, N.P.                    Lazaro Arms, M.D.    SMD/MEDQ  D:  09/10/2002  T:  09/10/2002  Job:  [id number]   cc:   [facility] [address]  [place], [place]

## 2011-01-17 NOTE — H&P (Signed)
NAME:  John Cox NO.:  [id number]   MEDICAL RECORD NO.:  [id number]          PATIENT TYPE:  INP   LOCATION:  1827                         FACILITY:  [facility]   PHYSICIAN:  Hillery Aldo, M.D.   DATE OF BIRTH:  [birth year]   DATE OF ADMISSION:  06/15/2005  DATE OF DISCHARGE:                                HISTORY & PHYSICAL   PRIMARY CARE PHYSICIAN:  Unassigned.   CHIEF COMPLAINT:  Abdominal pain.   HISTORY OF PRESENT ILLNESS:  The patient is a 40 year old male with past  medical history of poorly controlled type 1 diabetes, multiple admissions  for ketoacidosis secondary to noncompliance with his medical regimen and  polysubstance abuse.  He evidently presented to the emergency department  with complaints of abdominal pain.  Admitted to the emergency room physician  that he has been drinking up to a case of beer today.  When I interviewed  him, the patient would not wake up enough to answer any questions.  He would  moan and turn away from me.   ALLERGIES:  No known drug allergies.   MEDICATIONS:  From last discharge summary, he should have been on:  1.  Lantus 50 units subcu q.h.s.  2.  NovoLog sliding scale insulin.   PAST MEDICAL HISTORY:  1.  Poorly controlled diabetes mellitus type 1.  Last hemoglobin A1C was      12%.  2.  History of cocaine abuse.  3.  History of tobacco abuse.  4.  History of multiple admissions for diabetic ketoacidosis.   FAMILY HISTORY:  Per old records, he has a brother who has schizophrenia.   SOCIAL HISTORY:  On polysubstance abuse and tobacco abuse.  Otherwise,  unable to obtain.   REVIEW OF SYSTEMS:  The patient will not respond to any questions except to  moan and turn away.   PHYSICAL EXAMINATION:  VITAL SIGNS:  Temperature 97.7, pulse 72,  respirations 20, blood pressure 94/56, O2 saturation 96% on room air.  GENERAL APPEARANCE:  Well-developed, well-nourished male who is lethargic  and has an odor of ketones  about him.  HEENT:  Normocephalic, atraumatic.  PERRL.  EOMI.  Oropharynx reveals dry  mucous membranes.  NECK:  Supple.  No thyromegaly.  No lymphadenopathy.  No JVD.  CHEST:  Lungs clear to auscultation bilaterally.  Good air movement.  HEART:  Regular rate and rhythm with slightly hyperdynamic precordium.  ABDOMEN:  Moans when palpated.  Soft.  Bowel sounds positive.  EXTREMITIES:  No cyanosis, clubbing or edema.  SKIN:  Warm and dry.  No rashes.  NEUROLOGICAL:  The patient is lethargic and refuses to cooperate with  neurologic exam, but appears to move all extremities x4 spontaneously.   LABORATORY DATA:  Hemoglobin 14.3, hematocrit 43.0, platelets 332,000, white  blood cell count 6.3.  Lipase 11, alcohol level less than 5.  Urinalysis  revealed 80 mg/dL of ketones and greater than 1000 mg/dL of glucose.  Arterial blood gas revealed a pH of 7.299, PCO2 35.6, PO2 104, bicarbonate  17.4.   ASSESSMENT/PLAN:  1.  Diabetic ketoacidosis.  Will  admit the patient to a stepdown unit and      begin vigorous IV fluid hydration with normal saline.  We will also give      him a 10 unit regular insulin bolus and place him on an insulin drip.      Will use the glucomander to keep his blood glucose between 120-160.      When his CBG is less than 250, will change his IV fluids to D-5 normal      saline.  Will check his blood glucose hourly and his basic metabolic      panel every 4 hours.  Most likely etiology of his ketoacidosis is      noncompliance in the setting of alcohol and ongoing substance abuse.  2.  Polysubstance abuse.  Will check a urine drug screen.  He has been known      to use cocaine in the past.  3.  Tobacco abuse.  Will set the patient up for tobacco cessation      counseling.  4.  Prophylaxis.  Will initiate gastrointestinal prophylaxis and use PAS      hose for deep vein thrombosis prophylaxis.  It is expected that the      patient will be  mobile when his diabetic  ketoacidosis is treated.           ______________________________  Hillery Aldo, M.D.     CR/MEDQ  D:  06/16/2005  T:  06/16/2005  Job:  [id number]

## 2011-01-17 NOTE — Consult Note (Signed)
NAME:  John Cox, GRAYS NO.:  [id number]   MEDICAL RECORD NO.:  [id number]          PATIENT TYPE:  INP   LOCATION:  4709                         FACILITY:  [facility]   PHYSICIAN:  Clovis Pu. Cornett, M.D.DATE OF BIRTH:  12-Aug-1971   DATE OF CONSULTATION:  DATE OF DISCHARGE:                                 CONSULTATION   PHYSICIAN REQUESTING CONSULTATION:  Dr. Mayford Knife from the teaching  service.   REASON FOR CONSULTATION:  Abdominal pain with history of portal venous  thrombosis.   HISTORY OF PRESENT ILLNESS:  The patient is a 40 year old male admitted  to a the teaching service on October 31, 2006.  He was admitted for nausea,  vomiting and abdominal pain.  The abdominal pain is diffuse.  There is  no radiation.  It is anywhere from 5-8 out of 10.  It is constant.  He  has a history of constipation in the past but he has apparently been  moving his bowels.  Nothing seems to make it better or worse.  Of note,  he has a diagnosis of a hypercoagulable state secondary to  antiphospholipid antibodies.  He is on no Coumadin even though he has a  history of pulmonary embolus due to very poor medical compliance.  He is  also has diabetes mellitus.  Apparently he is very poorly medically  compliant with that as well.  I was asked at the patient's request that  Dr. Mayford Knife do the portal venous thrombolus and abdominal pain.   PAST MEDICAL HISTORY:  1. Type 1 diabetes mellitus.  2. Antiphospholipid antibodies syndrome.  3. Chronic constipation.  4. Polysubstance abuse.  5. History of pulmonary embolus.  6. History of very poor medical compliance.   PAST SURGICAL HISTORY:  None.   FAMILY HISTORY:  The mother had diabetes.  The father was apparently  healthy.   SOCIAL HISTORY:  He does use cocaine, marijuana and tobacco.  Also he  has no primary medical doctor.   DRUG ALLERGIES:  None.   REVIEW OF SYSTEMS:  15 point review of systems are contained as above  otherwise  negative.   MEDICATIONS:  1. Lantus 25 units nightly.  2. Percocet as needed for pain.  3. Ibuprofen as needed for pain.  4. Humulin sliding scale.  5. Dulcolax as needed.   PHYSICAL EXAMINATION:  VITAL SIGNS:  Temperature 99.  Blood pressure  116/70.  Pulse 73.  GENERAL APPEARANCE:  Male in bed.  No apparent distress.  HEENT:  No evidence of sclera icterus.  Oral pharynx is moist.  Normocephalic, atraumatic.  NECK:  Supple, nontender.  No mass.  CHEST:  Clear to auscultation.  Chest wall motion is normal.  CARDIOVASCULAR:  Regular rate and rhythm.  Peripheral pulses are  present.  Good profusion.  ABDOMEN:  Flat.  Mildly tender to palpation.  Without peritonitis.  Without rebound.  Without guarding.  No mass.  No hernia.  EXTREMITIES:  Muscle tone is normal.  Range of motion is normal.  NEUROLOGICAL EXAMINATION:  Motor and sensory function is grossly intact.  The Glasgow Coma Scale is  15.  Cranial nerves II-XII appear intact.   DIAGNOSTIC STUDIES:  Reviewed his CT and MRI of his abdomen.  He does  have extensive portal venous thrombosis extending into this splenic and  superior mesenteric veins.  There is some free fluid and mild left  clonic thickening but no evidence of free air.  There is some ascites.   LABORATORY STUDIES:  White count 4800.  Hemoglobin 11.3.  Platelet count  was 283000.  Sodium at 137.  Potassium 4.7.  Chloride 98.  CO2 32.  BUN  4.  Creatinine 0.91 and glucose 271.  CBG between 144 and 243.   IMPRESSION:  This is a 40 year old male with portal venous thrombosis  with known antiphospholipid antibody syndrome as well as very poor  medical compliance and polysubstance abuse and type 1 diabetes with clot  propagation.   PLAN:  I recommend heparin for this patient as well as aggressive IV  fluid hydration and making NPO.  I would recommend prophylactic  antibiotics consisting of Cefoxitin 1 gram every 8 hours.  Unfortunately  given his underlying  antiphospholipid antibody abnormality he will  require Coumadin.  He apparently has issues taking medications  especially Coumadin and is very noncompliant.  I had a lengthy  discussion with him that without Coumadin he will die from this problem  sooner or later.  I would not expect him to live past the age of 59  especially given the fact that he already has portal venous clot and  this will propagate without appropriate therapy.  There is no surgical  therapy for this except bowel resection if anticoagulation does not work  or if the patient develops ischemia secondary to venocongestion.  At  this point in time I see no signs of that and encouraged him to take  this.  I discussed this case with Dr. Mayford Knife as well over the phone  and strongly feel that he needs Coumadin.  We will follow along as  needed but I see no acute surgical indication at this point in time.   Thank you for your consultation.      Thomas A. Cornett, M.D.  Electronically Signed     TAC/MEDQ  D:  11/04/2006  T:  11/04/2006  Job:  [id number]

## 2011-01-17 NOTE — H&P (Signed)
NAME:  John Cox, John Cox NO.:  [id number]   MEDICAL RECORD NO.:  [id number]                   PATIENT TYPE:  INP   LOCATION:  0479                                 [facility]ACILITY[facility]  [facility]   PHYSICIAN:  Hollice Espy, M.D.            DATE OF BIRTH:  June 13, 1971   DATE OF ADMISSION:  07/17/2003  DATE OF DISCHARGE:                                HISTORY & PHYSICAL   PRIMARY CARE PHYSICIAN:  [facility].   DIAGNOSIS:  Hyperglycemia.   HISTORY OF PRESENT ILLNESS:  This is a 40 year old African-American male  with a past medical history of diabetes type 2 which he states is not very  well controlled in that his BGM's usually range between 200s to 300s.  He  recently has been complaining of an upper respiratory infection, but  otherwise has been doing well, and he states that yesterday he ran out of  his oral agents, but continued to take his Lantus insulin.  The patient is  on Avandia and Glucotrol, and from discussion, I believe his numbers are of  the low dose.  He went to [facility] because he was not feeling well  today, and when his blood sugars were checked the monitor read too high.  He  was sent over to [facility] ER.  There, his blood sugars were checked and  found to be 703.  The patient was given 20 units of IV regular insulin and  his blood sugars fell into the 400s.  He was started on the IV insulin drip  as was originally planned, and his blood sugars falling further to 78.  The  patient tolerated these drops without any incident.  He was then allowed to  eat.  Currently, the patient states he is doing well.  He still feels a  little shaky, but otherwise no other complaints.  He feels tired.  He  complains of some upper airway congestion.  He denies any headaches, visual  changes, loss of hearing, nausea, vomiting, dysphagia, chest pain, shortness  of breath, wheezing, cough, abdominal pain, extremity pain or weakness,  hematuria,  dysuria, constipation, or diarrhea.   PAST MEDICAL HISTORY:  1. Diabetes type 2.  2. Recent URI.   MEDICATIONS:  1. Lantus what appears to be 40 units q.h.s.  2. Avandia, which he says is 5 mg, I believe this is 4 mg daily.  3. He told the ER he was taking Glucophage 5 once a day.  When I asked him     if this was Glucotrol or Glucophage he said that he was pretty sure that     it was Glucotrol which would consists both of D5 mg dose as well as the     once daily dose.   ALLERGIES:  No known drug allergies.   SOCIAL HISTORY:  The patient denies any drug use.   FAMILY HISTORY:  Noncontributory.  PHYSICAL EXAMINATION:  GENERAL:  He is alert and oriented x3, in no apparent  distress.  HEENT:  Normocephalic, atraumatic.  Mucous membranes are slightly dry.  CARDIOVASCULAR:  Regular rhythm, normal S1 and S2.  LUNGS:  Clear to auscultation bilaterally.  ABDOMEN:  Soft, nontender, nondistended, positive bowel sounds.  EXTREMITIES:  No cyanosis, clubbing, or edema.  VITAL SIGNS:  Temperature 97.5, blood pressure 125/74, heart rate 94,  respirations 18.  These are on admission.   LABORATORY DATA:  His sodium is 130, potassium 4.5, chloride 95, bicarbonate  27, BUN 13, creatinine 1.2, glucose of 703.  UA shows greater than 1000  glucose, 15 ketones, otherwise negative, no white cells.  White count 6.6,  H&H 14.7 and 44.5, MCV of 83, platelet count 306, with a 75% shift.   ASSESSMENT AND PLAN:  Hyperglycemia in type 2 patient with not well  controlled at home, plus he has had a recent upper respiratory tract  infection.  Rapid response is likely secondary to poor compliance.  We will  check an A1C, continue medications, although for tonight I will decrease his  Lantus from 40 to 30, cover with a sliding scale, and if okay plan to  discharge in the morning.  Originally, I had thought that perhaps maybe I  would discharge him on a higher dose, but I suspect again this is more due  to  compliance and he just needs to be reinforced on his medications.                                               Hollice Espy, M.D.    SKK/MEDQ  D:  07/17/2003  T:  07/17/2003  Job:  [id number]

## 2011-01-17 NOTE — Discharge Summary (Signed)
NAME:  John Cox, John Cox NO.:  [id number]   MEDICAL RECORD NO.:  [id number]          PATIENT TYPE:  INP   LOCATION:  5733                         FACILITY:  [facility]   PHYSICIAN:  Norton Blizzard, M.D.    DATE OF BIRTH:  1971-02-06   DATE OF ADMISSION:  10/31/2006  DATE OF DISCHARGE:  11/12/2006                               DISCHARGE SUMMARY   DISCHARGE DIAGNOSES:  1. Diabetes mellitus type 1 status post diabetic ketoacidosis this      admission.  2. Portal venous thrombosis.  3. Nausea and vomiting secondary to #1.  4. Acute renal failure due to dehydration.  5. Dehydration.  6. Abdominal pain secondary to #2.  7. Polysubstance abuse.  8. Hypokalemia.  9. Pruritus.  10.Antiphospholipid antibody syndrome.   CONSULTS:  1. [facility].  2. Phone consult with interventional radiology.  3. Phone consultation with hematology/oncology.   PROCEDURES:  1. PICC line placement.  2. Abdominal and pelvis CT which showed diffuse portal vein thrombosis      including SMV.  Heterogenous liver may indicate a mass.  Thickening      of the left colon, possibly related to vascular congestion or      nonspecific colitis.  Small amount of free fluid in the pelvis.  3. MRI of the abdomen and pelvis which showed thrombosis over the      portal vein, splenic vein, and superior mesenteric vein with no      mass identified.  Diffuse abdominal retroperitoneal mesenteric      ascites which may be secondary to portal venous thrombosis.  Small      amount of pelvic ascites and diffuse mesenteric edema.  No pelvic      masses.  4. Abdominal ultrasound had normal appearance of the liver and portal      vein.  Incomplete exam due to patient refusing complete scan.  5. CT of the head without contrast:  No acute intracranial finding.  6. KUB:  Normal gas pattern.  7. Chest x-ray showed a right PICC line had been placed with tip in      the proper position.   HOSPITAL COURSE:  1. Diabetic ketoacidosis in patient with a history of type 1 diabetes      mellitus.  He is status post aggressive rehydration with IV fluid      boluses and then changed over to D5 half-normal saline and      decreased from that with increased p.o. intake to one-half normal      saline and then off IV fluids.  He is status post a Glucommander      protocol and was placed back on his previous home dose of 25 units      of Lantus daily.  His gap had closed with a bicarbonate 12 on      admission down to 22.  We replaced his potassium as needed.  He has      been nonadherent at home with treatment.  At this point, he seems      only in the contemplative phase about  change with regard to      adherence with his insulin.  Leading up to discharge, he was      titrated up to 33 units of Lantus daily as his CBGs were 167 to 245      when he was on 28 units the previous day.  I think it would be      prudent to consider something like Novolin 70/30 to increase his      compliance instead of doing sliding-scale insulin in addition to      Lantus.  2. Portal venous thrombosis.  This is the cause of the patient's      abdominal pain on admission.  It was found on CT and confirmed by      MRI.  Per interventional radiology, there was no intervention that      would be effective to treat the extensive clot in this area.      Patient has been noncompliant with anticoagulation on several      occasions in the past two years.  Of note, he has had a history of      antiphospholipid antibody syndrome diagnosed two years ago in 2006      when he had bilateral DVTs and PEs.  He has not followed up outside      the hospital and every time his INR is checked in the hospital it      is subtherapeutic at 1.0 or 1.1.  It is our hope that the clot will      recanalized or collateral vessels will form.  We decided to start      him on anticoagulation again, the heparin breaks the Coumadin in      order to prevent  propagation of the clot.  Per hematology/oncology,      the pain will improve in weeks to months when vessels      collateralized or the clot recannulizes.  We looked for evidence of      malignancy which was not evident neither by history or by      examination including no reported testicular masses and the patient      having heme-negative stool.  He also had an otherwise normal      physical exam, some lymphadenopathy, no other finding consistent      with malignancy.  Patient was given morphine PCA for pain control,      we then changed to p.o. morphine which had been changed to      oxycodone due to extreme pruritus he was having with morphine, but      he continued  to have this pruritus with oxycodone so we changed      him to p.o. Tylenol and Ultram and finally to Ultram and Vicodin on      discharge which he seemed to tolerate the Vicodin better than the      morphine or the oxycodone.  3. Nausea and vomiting felt to be due to #1 above.  Gave the patient      Zofran as needed.  This had resolved leading up to discharge.  4. Acute renal failure was due to dehydration.  His creatinine was up      to 2.41 on admission.  It was down to 0.78 on discharge and much      improved with aggressive IV rehydration.  5. Abdominal pain due to #2.  His bilirubin was elevated on admission.      The CT  results showed portal venous thrombosis as noted above as      well as an MRI to confirm.  His AST and ALT were slightly elevated      on admission and trended down throughout hospital stay.  Pain      control was given as noted in #2 above.  Surgery was consulted due      to a concern for mesenteric ischemia and they questioned if this is      ischemic bowel he may need surgery.  Surgery felt that he was      having intermittent ischemia in  bowel wall due to venous      congestion, but there was no acute thrombosis or bowel infarction     and acute surgical issue.  We made the patient NPO x2  days with      cefoxitin prophylaxis.  The patient desired to eat after this time      and accepted the risk of infarction, ischemic bowel, and      perforation and was advanced to a carbohydrate-modified diet.  6. Anemia was 13.9 on this admission, though the patient was in DKA      and very dehydrated at the time.  His previous hemoglobin on the      last discharge was 10.8.  He was heme-negative this admission.      Leading up to discharge, his hemoglobin was 10.7.  7. Hyperkalemia resolved with IV fluids after DKA.  He was hypokalemic      at one point and required K-Dur.  8. Polysubstance abuse.  Patient was cocaine-positive again on this      admission.  He had a social work consult for this and to discuss      medication assistance which he qualified for and give [facility]      information.  Patient is only in the contemplative phase about      changing his lifestyle.  9. Pruritus due to histamine release from narcotics, morphine and      oxycodone.  This was improved while changing to Ultram and Tylenol      and finally to Ultram and Vicodin.  10.Antiphospholipid antibody syndrome.  As noted above, patient was on      heparin and Coumadin.   DISCHARGE MEDICATIONS AND INSTRUCTIONS:  1. Coumadin 10 mg daily on Saturday and Sunday per pharmacy recs.  2. Lantus 33 units every morning.  3. Ultram 100 mg every six hours as needed for pain.  4. MiraLax 17 gm packet in 8 ounces of fluid twice a day as needed for      constipation.  5. Senokot-S two tablets b.i.d. for constipation.  6. Vicodin 5/500 one to two tabs every six hours as needed.  Patient      was instructed not to exceed this amount due to risks of his liver.   DISCHARGE CONDITION:  Stable.   FOLLOWUP:  Patient was instructed to follow up on Monday at the [facility] for a lab draw and was given the times to  come in for this to be done.   ISSUES FOR FOLLOWUP:  1. INR checks and Coumadin  adjustment.  2. Polysubstance abuse rehabilitation.  3. Medication assistance if patient cannot afford these medications.  4. Establish care with a primary care physician.  5. Consider changing insulin regimen to something like 70/30      considering he may be more likely to take twice a  day instead of      once-a-day Lantus with sliding scale.  6. Ensure that patient has had a bowel movement after being on high      doses of a narcotic in the hospital.           ______________________________  Norton Blizzard, M.D.     SH/MEDQ  D:  11/13/2006  T:  11/13/2006  Job:  [id number]   cc:   [facility]

## 2011-01-17 NOTE — H&P (Signed)
NAME:  John Cox, John Cox NO.:  [id number]   MEDICAL RECORD NO.:  [id number]          PATIENT TYPE:  INP   LOCATION:  4709                         FACILITY:  [facility]   PHYSICIAN:  Neena Rhymes, M.D. DATE OF BIRTH:  14-Aug-1971   DATE OF ADMISSION:  10/31/2006  DATE OF DISCHARGE:                              HISTORY & PHYSICAL   CHIEF COMPLAINT:  Nausea and vomiting.   HISTORY OF PRESENT ILLNESS:  The patient was recently discharged from  [facility] on February 28 following admission for  hyperglycemia, abdominal pain, and vomiting.  The patient again presents  with abdominal pain, nausea, and vomiting, and is found on labs to be in  DKA.  The patient, at the time of evaluation, had altered mental status  and was unable to provide additional information.   PAST MEDICAL HISTORY:  Problem:  1. Type 1 diabetes mellitus.  2. Antiphospholipid antibody syndrome with a history of PE.  3. Constipation.  4. Polysubstance abuse.   FAMILY HISTORY:  Per last H&P, patient's mother has diabetes, patient's  father is healthy.   SOCIAL HISTORY:  Per last H&P, patient does not have a primary care  physician.  He uses cocaine, marijuana, and tobacco.   DRUG ALLERGIES:  NO KNOWN DRUG ALLERGIES.   MEDICATIONS:  Per patient's discharge summary, medications are as  follows:  1. Lantus 25 units q.h.s.  2. Humulin sliding scale.  3. Dulcolax 50 mg p.r.n.  4. Percocet 5/325 mg q.6 p.r.n.  5. Ibuprofen 600 mg q.6 p.r.n.   REVIEW OF SYSTEMS:  Unable to obtain.   PHYSICAL EXAM:  VITAL SIGNS:  Temp 98.8, pulse 96, respirations 18,  sating 99% on room air, blood pressure 101/52.  GENERAL:  The patient is awake, agitated, oriented to person and place  only.  HEENT EXAM:  Patient has tachy mucous membranes, pinpoint pupils, but  extraocular muscles are intact.  NECK:  No lymphadenopathy.  LUNGS:  Clear to auscultation bilaterally.  HEART:  Regular rate and  rhythm and 2/6 systolic ejection murmur.  ABDOMEN:  Soft, tender to palpation diffusely with involuntary guarding,  positive bowel sounds.  GU EXAM:  Deferred.  EXTREMITIES:  No clubbing, cyanosis, or edema, +2 DP and radial pulses.  SKIN:  No lesions.  NEUROLOGIC EXAM:  Nonfocal.   LABORATORY DATA:  WBC 12, hemoglobin 13.9, hematocrit 42.7, platelets  365, neutrophils 87%, lymphocytes 8%, monos 5%.  Sodium 132, potassium  6.1, chloride 89, bicarb 12, BUN 18, creatinine 2.12, glucose 651, T  bili 2.7, alk phos 131, AST 45, ALT 85, T protein 7.9, albumin 4,  calcium 10.3, lipase 16, amylase 45, alcohol less than 5.   IMPRESSION AND PLAN:  A 40 year old male with diabetic ketoacidosis,  nausea, vomiting, and abdominal pain.  Problem:  1. Diabetic ketoacidosis.  Admit to stepdown or intensive care unit      bed for treatment with a Glucommander.  Glucomander used per      protocol.  Normal saline boluses followed by half-normal saline at      500 mL an hour until  glucose reaches 200 and then change fluids to      D5 half-normal saline at 250 mL per hour.  We will check basic      metabolic panel q.2h. while patient is in diabetic ketoacidosis,      and while hyperkalemic now, with intravenous fluids and insulin      administration he will likely shift his potassium intracellularly      and will need to be repleted on a p.r.n. basis.  We are weaning his      arterial blood gas to determine if bicarb is needed in his      intravenous fluids.  Bicarb will be added if pH is less than 7.0.  2. Nausea and vomiting, likely secondary to problem #1.  We will      continue intravenous fluids as above and treat him with Zofran      p.r.n.  3. Abdominal pain, unclear etiology at this time.  At previous      discharge, the problem was thought to be secondary to constipation.      We will check an abdominal ultrasound as liver function tests are      persistently elevated and now bilirubin is  increased.  We will      treat with morphine p.r.n.  No ibuprofen secondary to acute renal      failure.  4. Acute renal failure, likely prerenal secondary to dehydration.  The      patient has no history of renal disease.  We will continue to      hydrate as above and turn creatinine.  5. Increased liver function tests.  AST, ALT, as mentioned above, was      elevated on previous admission and now trending down; however,      total bilirubin is increased at 2.7 and was previously within      normal limits.  We will check abdominal ultrasound for evidence of      biliary obstruction.  6. Hyperkalemia.  Will not treat at this time as the patient will      experience dramatic potassium shift after intravenous fluid and      insulin administered.  We will continue to check q.2 basic      metabolic panels and address the potassium p.r.n.  7. Polysubstance abuse.  At time of admission, urine drug screen is      pending.  8. FEN/GI.  The patient will remain NPO and receive intravenous fluids      as above.  9. Antiphospholipid antibody syndrome.  It had been determined in the      past that the patient is not a candidate for anticoagulation on      Coumadin secondary to his noncompliance.  10.Prophylaxis.  The patient will be placed on sequential compression      device and Protonix.      Neena Rhymes, M.D.     KT/MEDQ  D:  11/04/2006  T:  11/04/2006  Job:  [id number]

## 2011-01-17 NOTE — Discharge Summary (Signed)
NAME:  John Cox, GRANT NO.:  [id number]   MEDICAL RECORD NO.:  [id number]          PATIENT TYPE:  INP   LOCATION:  5017                         FACILITY:  [facility]   PHYSICIAN:  Eliseo Gum, M.D.   DATE OF BIRTH:  30-Apr-1971   DATE OF ADMISSION:  01/14/2006  DATE OF DISCHARGE:  01/16/2006                                 DISCHARGE SUMMARY   Cancel this dictation.  Thank you.      Clearance Coots, M.D.    ______________________________  Eliseo Gum, M.D.    IN/MEDQ  D:  01/20/2006  T:  01/21/2006  Job:  [id number]

## 2011-01-17 NOTE — Discharge Summary (Signed)
NAME:  COSME, JACOB NO.:  [id number]   MEDICAL RECORD NO.:  [id number]          PATIENT TYPE:  INP   LOCATION:  5740                         FACILITY:  [facility]   PHYSICIAN:  Alvester Morin, M.D.  DATE OF BIRTH:  July 11, 1971   DATE OF ADMISSION:  05/18/2005  DATE OF DISCHARGE:  05/20/2005                                 DISCHARGE SUMMARY   DISCHARGE DIAGNOSES:  1.  Hyperglycemia, not diabetic ketoacidosis.  2.  Type 1 diabetes mellitus, poorly controlled, hemoglobin A1c 12.  3.  Cocaine abuse.  4.  Tobacco abuse.  5.  Possible sexually transmitted diseases (unconfirmed, patient's fear,      human immunodeficiency virus negative).   DISCHARGE MEDICATIONS:  1.  Lantus 50 units subcu before bedtime.  2.  NovoLog sliding scale insulin.   CONDITION ON DISCHARGE:  Good.   FOLLOW-UP:  Follow up the GC and Chlamydia.  Patient to follow up in the  outpatient clinic, appointment to be made.   PROCEDURES:  None.   CONSULTATIONS:  None.   HISTORY AND PHYSICAL:  For a complete history and physical, please consult  the admission H&P, but in brief, Mr. Hoos is a 40 year old African-American  man with type 1 diabetes mellitus diagnosed in 1999, with a history of three  or four episodes of diabetic ketoacidosis and a history three months ago for  DKA, who presented to the emergency room with a two to three-day history of  polydipsia, vomiting and urinary frequency and associated weight loss.  He  noted fatigue and dry mouth and felt in the way that he feels when he  usually gets DKA.   PHYSICAL EXAMINATION:  ADMISSION VITALS:  Temperature 97.8, blood pressure  117-122/57-73, pulse 64-79, respirations 16-20, oxygen saturation 96% on  room air.  GENERAL:  He was alert and oriented, in no acute distress, and resting  comfortably.   Other pertinent positives on his physical:  HEENT:  He has poor dentition with caries in his lower molars and a dry  mouth.  CHEST:   Lungs were clear.  CARDIAC:  Regular rate and rhythm, no murmurs, rubs or gallops.  GASTROINTESTINAL:  Nontender, nondistended, positive bowel sounds.  SKIN:  Warm and dry.  NEUROLOGIC:  Grossly intact.  PSYCHIATRIC:  Appropriate mood and affect.   BMET at first was sodium 133, potassium 4.6, chloride 100, bicarb 21,  creatinine 1.2 and glucose 668 with a gap of 6.  Hemoglobin 15.6, white cell  count 5.1, hematocrit 46%.  Later on in the day his BMET was improved, and  this was checked still while in the ED:  Sodium 143, potassium 3.7, chloride  107, bicarb 30, BUN 12, creatinine 1.1 and glucose 114.  At this point his  gap was closed, it was a gap of 6.  Lipase was 25.  A VBG showed a pH when  he first got there of 7.370, PCO2 of 54.3 and a bicarb of 31.4.  His UA was  within normal limits except for a specific gravity of 1.041, glucose greater  than 1000, and negative ketones.  His urine drug screen was positive for  cocaine.   Problem 1.  HYPERGLYCEMIA:  It was felt to be secondary to medical  noncompliance, as the patient admitted to a drug binge.  The patient checks  his sugars sporadically and infrequently and takes insulin often empirically  without evidence from his blood sugar checks.  He said he has also had prior  admissions for DKA.  It was likely that his hyperglycemia was due to medical  noncompliance.  We did not feel that he was actually in frank DKA; however,  because of his gap, although it was 12 originally, was 6 and his bicarb was  normal, so it was probably just hyperglycemia.  He was therefore originally  put on an insulin drip.  That was quickly stopped and he was transferred to  subcu insulin.  His diet was advanced to clears, and he was checked another  BMET at 6 p.m. that evening to confirm that he was indeed not in DKA, which  he was not.  He was continued to get hydration and he got Lantus 50 units  q.h.s. and a sliding scale on top of that.  At the same  time he got a  diabetes consult for diabetes education.  He was also told that drugs and  alcohol could affect his ability to take care of his diabetes and could  affect his blood sugars in the case of alcohol.  He was advised to  discontinue use of drugs and alcohol for his health and was told of the  risks of uncontrolled diabetes including death from diabetic coma.   Problem 2.  COCAINE ABUSE:  This problem was felt to contribute to his  diabetes exacerbation because of altered mental status and therefore  nonadherence to managing his diabetes on a regular schedule.  He was advised  to contact Narcotics Anonymous and made a call while in the hospital.  I was  actually present when he called NA, and we spoke at great length about the  importance of returning to NA.  He has been multiple times before.  He says  he has failed six times to quit drugs.  The longest period that he has  abstained has been three years.  I talked about that period as being a  positive one that demonstrates the fact that he is able to quit.  I  encouraged him to return to NA.  He wants to move and go to another town  because he is ashamed for having failed.  I told him that it would be more  shameful for him not to face his problems head-on and start afresh.  I am  hoping that he will not decide to leave town because if he did, he would be  homeless and therefore have a decreased chance of successfully managing his  chance of successfully managing his diabetes.   Problem 3.        TOBACCO ABUSE:  I explained to him in great detail the  effects of tobacco on the cardiovascular system, i.e., that it will  accelerate the aging process and atherosclerosis.  I told him that diabetes  has the same effect and therefore in conjunction will greatly increase his  risk of cardiovascular disease.  He received a tobacco cessation consult  while in the hospital.  Problem 4.  FEAR OF HAVING CONTRACTED A SEXUALLY TRANSMITTED  DISEASE  SECONDARY TO A CONDOM BREAKING:  He expressed concern on admission that he  may have a sexually transmitted disease.  A GC and Chlamydia is pending.  HIV was negative.   On the day of discharge, the patient looked fine and said he was feeling  back to normal.  He got diabetes education.  T-max was 98.6, blood pressure  100-111/52-72, pulse 68-82, respirations 18-22.  Oxygen saturation 96-97% on  room air.  CBGs 125-330.  His exam was benign.  Hemoglobin 12.6, hematocrit  36.5, white count 5, platelets 273.  Hemoglobin A1c 12.  Sodium 138, potassium 3.5, chloride 105, bicarb 26, BUN  11, creatinine 1, glucose 158, calcium 8.5.  GC and Chlamydia were pending,  and HIV was negative.   Again, the pending labs are GC and Chlamydia tests, urine test.      Clearance Coots, M.D.      Alvester Morin, M.D.  Electronically Signed    IN/MEDQ  D:  05/20/2005  T:  05/21/2005  Job:  [id number]

## 2011-01-17 NOTE — H&P (Signed)
NAME:  John Cox, John Cox NO.:  [id number]   MEDICAL RECORD NO.:  [id number]          PATIENT TYPE:  INP   LOCATION:  0102                         FACILITY:  [facility]   PHYSICIAN:  Elliot Cousin, M.D.    DATE OF BIRTH:  1971/03/23   DATE OF ADMISSION:  09/29/2006  DATE OF DISCHARGE:                              HISTORY & PHYSICAL   PRIMARY CARE PHYSICIAN:  [facility].   CHIEF COMPLAINT:  Not feeling well.  Altered mental status per police  officer.   HISTORY OF PRESENT ILLNESS:  The patient is a 40 year old man with a  past medical history significant for diabetes mellitus, multiple  admissions for DKA and antiphospholipid syndrome who presents to the  emergency department with a chief complaint of generalized malaise and  not feeling well.  Last night, the patient was in the custody of the  [facility].  The patient appeared to be somewhat  lethargic with altered mental status.  He complained to the police  officer about diabetic issues.  The patient was therefore brought to  the emergency department for further evaluation and management.   When he arrived to the emergency department, he was noted to be  lethargic but became a little more alert later.  On arrival, he was  hemodynamically stable.  His lab data revealed a venous glucose of 582  and a bicarbonate of 15.  When the patient is questioned, he says that  he has not taken his insulin in almost two days.  He says that he has  not been feeling well and therefore did not take his insulin because he  was not eating.  He denies abdominal pain, nausea, vomiting or diarrhea.  He says he has been living with a friend and has been laying on the  floor intermittently for the past two days because he had not been  feeling well.  He has no complaints of headache, stiff neck, fever,  chills, chest pain, shortness of breath, cough, abdominal pain,  nausea,  vomiting, diarrhea or painful urination.   The patient will be admitted for further evaluation and management.   PAST MEDICAL HISTORY:  1. Type 1 diabetes mellitus.  2. Frequent admissions for DKA, last hospitalization was in October of      2007.  3. History of PE/DVT secondary to antiphospholipid syndrome, diagnosed      in 2006.  The patient is supposed to be on chronic Coumadin      therapy; however, he has been noncompliant.  4. Alveolitis and subtle pneumomediastinum per CT scan of the chest in      October of 2007.  5. Polysubstance abuse.  6. History of depression.  7. NONCOMPLIANCE.   MEDICATIONS:  1. Insulin 70/30 with 45 units in the morning and 35 units in the      evening.  2. The patient says that he does not take Coumadin on a regular basis.  3. Diabetes pill.   ALLERGIES:  No known drug allergies.   SOCIAL HISTORY:  The patient is single.  He has three children.  He  lives with a friend when he can.  He is currently unemployed.  He is  applying for disability.  He smokes a half a pack of cigarettes a day.  He smokes at least a six-pack of beer two to three days per week.  He  smokes crack cocaine and marijuana at least three times weekly.  He  denies intravenous drug use.   FAMILY HISTORY:  His mother is 47 years of age and has diabetes  mellitus.  His father is 64 years of age and has no known health  problems.   PHYSICAL EXAMINATION:  Temperature 98.5, blood pressure 152/72, pulse  106, respiratory rate 18, oxygen saturation 99% on room air.  GENERAL:  The patient is an average to small framed 40 year old [place]  American man who is currently lying in bed in no acute distress.  HEENT:  Head is normocephalic and atraumatic.  Pupils are equal, round,  and reactive to light.  Extraocular movements are intact.  Conjunctivae  are mildly injected.  Sclerae are clear.  Nasal mucosa is dry.  No sinus  tenderness.  Oropharynx reveals fair dentition.   Mucus membranes are  dry.  No posterior exudates or erythema.  Neck is supple.  No  adenopathy, no thyromegaly, no bruit, no JVD.  LUNGS:  A few scattered bronchospasms/expiratory wheezes.  Breathing is  nonlabored.  HEART:  S1-S2 with mild tachycardia.  No murmurs, rubs or gallops.  ABDOMEN:  Hypoactive bowel sounds.  Soft, nontender, and nondistended.  No hepatosplenomegaly, no masses palpated.  EXTREMITIES:  Pedal pulses are 2+ bilaterally.  No pretibial edema and  no pedal edema.  NEUROLOGICAL:  The patient is initially lethargic but becomes alert and  oriented to himself, place, time and year.  He answers questions  appropriately.  Strength is 5/5 grossly.  Sensation is grossly intact.   LABORATORY DATA:  CT scan of the head reveals no acute abnormalities.  Chest x-ray pending.  WBC 9.9, hemoglobin 16.4, platelets 384.  Urine  drug screen positive for cocaine.  Urinalysis positive for greater than  1000 glucose, greater than 80 ketones, negative leukocyte esterase.  Sodium 131, potassium 5.6, chloride 94, CO2 15, glucose 582, BUN 55,  creatinine 2.7, calcium 9.1.  Alcohol level less than 5.  Myoglobin 457.  CK-MB 5.4, troponin I less than 0.05.   ASSESSMENT:  1. Diabetes mellitus presumed to be type 1 with evidence of diabetic      ketoacidosis:  The patient has had frequent hospital admissions for      diabetic ketoacidosis, obviously because of noncompliance.  His      venous glucose on admission is 582 and his bicarbonate is 15.  2. History of deep vein thrombosis/pulmonary embolus secondary to      antiphospholipid syndrome:  Per the review of the patient's records      from previous hospitalizations, he had been discharged to home on      Coumadin.  However, he continues to be lost to follow-up.  He is      notoriously noncompliant per previous documentation.  Although he      needs Coumadin, he is probably not the best candidate for ongoing     Coumadin therapy secondary  to drug abuse and noncompliance.  3. Bronchospasms:  The patient's bronchospasms may be secondary to      chronic obstructive pulmonary disease and/or reactive airway      disease from tobacco and crack  cocaine inhalation.  4. Polysubstance abuse with marijuana, cocaine, alcohol and tobacco.   PLAN:  1. The patient will be admitted for further evaluation and management.  2. We will start IV fluid volume repletion.  The patient has already      received a couple of liters of normal saline in the emergency      department.  3. We will start the Glucomander insulin drip protocol.  4. We will add potassium to the IV fluids as his serum potassium is      expected to fall.  5. We will monitor his electrolytes, renal function and bicarbonate      closely.  6. We will start Lovenox 1 mg/kg subcutaneously q.12h.  We will not      start Coumadin yet.  PT/PTT currently pending.  7. Substance abuse counseling.  We will add a nicotine patch.  We will      also add Ativan every four hours as needed.  8. We will check a chest x-ray.  We will start albuterol nebulizer      every six hours for bronchospasms.      Elliot Cousin, M.D.  Electronically Signed     DF/MEDQ  D:  09/30/2006  T:  09/30/2006  Job:  [id number]

## 2011-01-17 NOTE — Discharge Summary (Signed)
NAME:  John Cox, John Cox NO.:  [id number]   MEDICAL RECORD NO.:  [id number]          PATIENT TYPE:  IPS   LOCATION:  0505                          FACILITY:  [facility]   PHYSICIAN:  Geoffery Lyons, M.D.      DATE OF BIRTH:  12-24-1970   DATE OF ADMISSION:  06/04/2005  DATE OF DISCHARGE:  06/10/2005                                 DISCHARGE SUMMARY   CHIEF COMPLAINT AND PRESENT ILLNESS:  This was the first admission to [facility] for this 40 year old African-American male, single,  voluntarily admitted.  Feeling very depressed.  Overdosed on 10 tablets of  ibuprofen.  Said that he could no longer cope with the depression.  Admits  to fairly constant auditory hallucinations telling him he is worthless.  Fears he will be diagnosed with schizophrenia like his brother.  Drinking  alcohol three times a week and cocaine on most days.  Not taking his  diabetes medications.   PAST PSYCHIATRIC HISTORY:  First time at [facility].  Previously at  [facility] for substance abuse.   ALCOHOL/DRUG HISTORY:  Substance abuse since age 59, cocaine, marijuana and  alcohol.   MEDICAL HISTORY:  Diabetes mellitus, diagnosed in 1998.   MEDICATIONS:  Lantus 5 units at night, noncompliant.   PHYSICAL EXAMINATION:  Performed and failed to show any acute findings.   LABORATORY DATA:  Hemoglobin A1C 12.1.  Thyroid profile within normal  limits.  EKG within normal limits.  CBC with white blood cells 10.2,  hemoglobin 17.1.  Blood chemistry with glucose 258, SGOT 27, SGPT 36.  Drug  screen positive for cocaine.   MENTAL STATUS EXAM:  Fully alert, cooperative male, somewhat blunted affect,  guarded, worries.  Poor eye contact.  Speech normal in pace, tone, decreased  in amount.  Mood depressed, anxious.  Affect constricted.  Thought processes  logical, coherent and relevant.  Endorsed suicidal ideation.  Can contract  for safety.  Endorsed auditory hallucinations.  Cognition was  well-  preserved.   ADMISSION DIAGNOSES:  AXIS I:  Depressive disorder with psychotic features.  Cocaine abuse.  AXIS II:  No diagnosis.  AXIS III:  Diabetes mellitus, uncontrolled.  AXIS IV:  Moderate.  AXIS V:  GAF upon admission 25; highest GAF in the last year 55.   HOSPITAL COURSE:  He was admitted.  He was started in individual and group  psychotherapy.  He was given trazodone for sleep.  He was given Lantus  insulin 50 units daily, given Librium 25 mg as needed for any withdrawal.  He was started on Abilify 10 mg per day.  He was given some Darvocet for  pain.  Internal medicine was consulted and they helped manage the diabetes  mellitus.  He was given trazodone for sleep.  History of polysubstance abuse  and dependence.  History of diabetes mellitus.  Could not hold a job due to  the fact that he gets sick very often.  He ended up not being able to go  back or requesting days off, call in sick.  He has been fired from  his jobs.  Ends up with no money.  Daily staying with his mother.  After he last  relapsed, claimed that his mother had said that he was not welcomed back.  Endorsed his depression has been getting worse.  His medical condition  contributes to the depression, his relapses.  Endorses he hears the voices,  voices telling him to hurt himself.  We continued to detox.  Work relapse  prevention.  We started Abilify.  Endorsed he has heard voices for a long  time.  Claimed that the voices were not related to the substance use.  Continued to endorse the depression.  Continued to stabilize.  Overwhelmed  as he felt he had no support outside of the hospital.  On October 10th, he  was in full contact with reality.  Endorsed no suicidal or homicidal  ideation.  No hallucinations.  No delusions.  Fully detoxed.  Has seen  improvement in the mood and the voices.  He was going to a halfway house.  He was also going to be followed up at [facility].  He was transported by  his  girlfriend to the halfway house.   DISCHARGE DIAGNOSES:  AXIS I:  Major depression with psychotic features.  Polysubstance dependence.  AXIS II:  No diagnosis.  AXIS III:  Diabetes mellitus, type 1.  AXIS IV:  Moderate.  AXIS V:  GAF upon discharge 50-55.   DISCHARGE MEDICATIONS:  1.  Lantus insulin 35 units in the morning and 25 mg at night.  2.  Abilify 10 mg, 2 daily.  3.  Trazodone 100 mg, 1 at night.  4.  Flovent inhaler 2 puffs twice a day.   FOLLOW UP:  [facility] as well as the medical outpatient clinic.      Geoffery Lyons, M.D.  Electronically Signed     IL/MEDQ  D:  07/09/2005  T:  07/10/2005  Job:  [id number]

## 2011-01-17 NOTE — H&P (Signed)
NAME:  John Cox, John Cox NO.:  [id number]   MEDICAL RECORD NO.:  [id number]                   PATIENT TYPE:  INP   LOCATION:  1832                                 FACILITY:  [facility]   PHYSICIAN:  Madeleine B. Erenest Rasher, M.D.         DATE OF BIRTH:  1970-12-19   DATE OF ADMISSION:  04/06/2004  DATE OF DISCHARGE:                                HISTORY & PHYSICAL   CHIEF COMPLAINT:  John Cox comes to [place] with a chief complaint of vomiting  and weakness.   ADMISSION TIME:  Is 12:30 a.m.   HISTORY OF PRESENT ILLNESS:  A 40 year old African American male with  diabetes diagnosed six years ago at [facility],  presented with polyuria.  He has been experiencing vomiting and weakness x  12 hours, status post eating two Mc[employer]les from [employer].  He does not  check his blood sugars at home.  He has had seven prior hospitalizations for  hyperglycemia/DKA in the last couple of years.  His normal regimen is 45 or  50 units of Lantus q.h.s.  He did not take Lantus last night.  He also drank  about 14 beers last night.  His insulin is prescribed by [facility], but he  does not have a primary care physician, nor does he monitor his sugars or  his diet.  He vomited about 15 times.  He has a sore throat and headache  secondary to vomiting.  No nausea, diarrhea, or constipation, or fever, no  cough. He is having blurred vision x 2 days, numbness in feet and leg x 3-4  days, positive polyuria, positive polydipsia.  Negative abdominal pain.  Positive weight loss, unsure about 5-10 pounds in the last month.   PAST MEDICAL HISTORY:  Negative except for diabetes.   No surgeries.  No hospitalizations other than DKA admissions.   DRUGS:  Lantus 45 units q.h.s.   No known drug allergies.   FAMILY HISTORY:  John Cox positive diabetes and MI.  Maternal grandmother,  maternal uncle also have diabetes.  Two aunts with MI.  Also positive for  hypertension on  maternal side.   SOCIAL HISTORY:  The patient lives with John Cox and 100 year old son.  The  patient is a smoker, one pack per day x 20 years.  Is a drinker, positive  ETOH, 14 beers over the weekend, just started drinking two months ago.  Positive for marijuana.  Denies any other drug use.   REVIEW OF SYSTEMS:  As above.  Positive for vomiting, weakness, polyuria,  polydipsia, blurred vision.  He also complains of a pruritic rash on his  left calf and right axilla.  No cough, no shortness of breath, no  palpitations.   EMERGENCY ROOM CARE:  He was given 2 liters bolus normal saline.  He was  started on Glucomander.  Monitored his B-MET.  CBGs every hour.  Zofran was  given 4-8 mg IV p.r.n. for  nausea.  ETOH screen was done.  A UA was also  ordered.   PHYSICAL EXAMINATION:  VITAL SIGNS:  Temperature 97.8, pulse 94,  respirations 20, blood pressure 142/71, 100% on room air.  GENERAL:  The patient is somewhat somnolent but oriented and able to answer  questions.  He is lying in bed with his head under the covers.  He is thin,  well developed.  HEENT:  Positive fruity odor to his breath.  Pupils equal, round and  reactive to light.  Extraocular movements intact.  Oropharynx tacky mucous  membranes.  Negative for lymphadenopathy.  RESPIRATORY:  Clear to auscultation bilaterally.  CV:  Regular rate and rhythm.  No murmurs, rubs or gallops.  Brisk capillary  refill.  Peripheral pulses 2+.  ABDOMEN:  Positive bowel sounds.  Soft, nontender, nondistended.  MUSCULOSKELETAL:  Full range of motion.  Muscle weakness 4/5 bilaterally.  SKIN:  There is a rash, macular, erythematous, patchy in the right axilla  and left calf.  NEURO:  Decreased sensation in the lower extremities bilaterally.  Positive  __________  .  GCS of 15.   LABS:  Hemoglobin 14.9, hematocrit 44.5, white blood cells 13.6, platelets  __________  .  Sodium 133, potassium 6.0, chloride 107, bicarb 15, BUN 26,  creatinine 1.2, blood  glucose 423.  ABG showed 7.239, 35, 15, 16, 12.  ETOH  was negative, less than 5 mg/dL.   ASSESSMENT:  This is a 40 year old African American male with insulin-  dependent diabetes, noncompliant, and admitted with diabetic ketoacidosis.   PLAN:  1. DKA.  A 2 liter bolus given IV fluids started at normal saline at 250     cc/hr.  Will change fluids to D-5 normal saline when CBG is less than     250.  Glucomander insulin drip at 6.4 units/hr IV, started in the ED.     CBGs every hour.  B-MET every two hours with mag and phos levels.  NPO.     Will advance diet.  We will check an HVA1C and another VVG in the     morning.  2. Diabetes mellitus, noncompliant.  CBG on admit to 423.  Positive blurred     vision x 2 days, positive numbness in the lower extremity.  We will order     diabetes education as well as consulting care management and refer to     number one for treatment of his diabetes.  We will advance to a sliding     scale and take him off the drip once his CBGs normalize.  3. Rash, pruritic macular, erythematous in the right axilla and left calf.     We will monitor and address in the morning.  4. Nausea, vomiting secondary to DKA.  Zofran 4 mg IV p.r.n. for nausea.                                                Tawnya Crook Erenest Rasher, M.D.    MBV/MEDQ  D:  04/07/2004  T:  04/07/2004  Job:  [id number]

## 2011-01-17 NOTE — H&P (Signed)
NAME:  John Cox, STOCKHAM NO.:  [id number]   MEDICAL RECORD NO.:  [id number]          PATIENT TYPE:  EMS   LOCATION:  MAJO                         FACILITY:  [facility]   PHYSICIAN:  Penni Bombard, MD       DATE OF BIRTH:  11-04-1970   DATE OF ADMISSION:  06/21/2004  DATE OF DISCHARGE:                                HISTORY & PHYSICAL   ATTENDING PHYSICIAN:  Leighton Roach McDiarmid, M.D.   PRIMARY CARE PHYSICIAN:  [facility] [place].   CHIEF COMPLAINT:  Nausea and vomiting.   HISTORY OF PRESENT ILLNESS:  This is a 40 year old African American male  with diabetes who started having abdominal pain, nausea, and vomiting  approximately one and a half days ago.  He did not take his insulin on  Wednesday and took it approximately 3 a.m. on Thursday. His current regimen  is Lantus 45 units subcu q.h.s. He denies any cough or URI symptoms. Denies  dysuria, fever, or chills. He drank approximately 24 ounces of beer on  Tuesday, but denies drinking on a regular basis. He denies any diarrhea,  endorses constipation at least for the past four to five days. He denies any  sick contacts. He does have a history of cocaine use and he says he last  used approximately two to three weeks ago. He states he does have abdominal  pain in his lower quadrants, but it does not radiate into his back and he  denies any chest pain.   MEDICATIONS:  Lantus 45 units subcu q.h.s.   PAST MEDICAL HISTORY:  Diabetes with multiple admissions for DKA. I am  unsure whether his diabetes is type I or II; past dictations say type I.   FAMILY HISTORY:  Mother has diabetes mellitus, myocardial infarction, and  hypertension. His maternal grandmother and uncle with diabetes. He has two  aunts that have had an MI and one uncle that has had a MI.   SOCIAL HISTORY:  He lives with his mother and 82 year old son. He endorses a  one pack per day of tobacco smoking for at least 20 years. He does  drink  alcohol, approximately 14 beers on the weekend. He endorses occasional  marijuana and cocaine use.   REVIEW OF SYSTEMS:  No fevers, chills, or nightsweats. No rashes or cough.  No chest pain. No palpitation. Positive constipation. No hematemesis.  Positive burning, intermittently, in his left calf. Review of systems is  otherwise negative and per HPI.   ALLERGIES:  No known drug allergies.   PHYSICAL EXAMINATION:  VITAL SIGNS: Temperature 97.2, pulse 92, respirations  20, blood pressure 106/72, and oxygen 100% on room air.  GENERAL: He is in no apparent distress, sleepy but arousable.  HEENT: He has an erythematous throat with no exudates or lymphadenopathy.  His eyes are PERRLA. EOMI.  NECK: Supple.  CV: Regular rate and rhythm with no murmurs, rubs, or gallops.  LUNGS: Clear to auscultation bilaterally with no rales, rhonchi, or wheezes  and he gave a good effort.  ABDOMEN: Positive bowel sounds and tender to palpation in the  right lower  quadrant and left lower quadrant. No rebound. No hepatosplenomegaly. No  palpable masses.  EXTREMITIES: 2+ pulses and 2+ DTRs and there are no skin rashes.  NEUROLOGIC: Cranial nerves II-XI are grossly intact. Rapid alternating  movements are intact as well.   LABORATORY DATA:  CBC reveals WBC of 12.7, hemoglobin 16.2, hematocrit 48.2,  platelet count 328,000. Sodium 136, potassium 4.7, chloride 105, bicarbonate  17.2, BUN 18, glucose 290 with repeat CBG showing 190.  PH on I-STAT, which  is venous, is 7.204, approximately six hours prior to this admission.  Urinalysis is negative.  Anion gap of 13.8.   ASSESSMENT/PLAN:  This is a 40 year old [place] American man with type I  diabetes, who presents in diabetic ketoacidosis.  1.  Diabetic ketoacidosis. I will change his IV fluids to D-5 half normal      and run them at 200 cc per hour. I will add 20  mEq of K to his fluid. I      will his magnesium and phosphorus, and I will check his  CBGs every four      hours. I will start an insulin drip at 5 units an hour until his gap      closes and then will change him to subcu insulin. I will check a BNP now      and then every six hours. Will check a lipase given his abdominal pain      and history of alcohol use. Will also check a hemoglobin A1C. I suspect      that his DKA is secondary to noncompliance as he has no signs or      symptoms of infection.  2.  Deep venous thrombosis prophylaxis. Ambulation.  3.  Diabetic diet.   DISPOSITION:  I expect he will have a short stay.       SJ/MEDQ  D:  06/21/2004  T:  06/21/2004  Job:  [id number]

## 2011-01-17 NOTE — Discharge Summary (Signed)
NAME:  John Cox, John Cox NO.:  [id number]   MEDICAL RECORD NO.:  [id number]          PATIENT TYPE:  INP   LOCATION:  3705                         FACILITY:  [facility]   PHYSICIAN:  Fleet Contras, M.D.    DATE OF BIRTH:  Dec 08, 1970   DATE OF ADMISSION:  01/22/2005  DATE OF DISCHARGE:  01/25/2005                                 DISCHARGE SUMMARY   ADMISSION DIAGNOSES:  1.  Diabetic ketoacidosis.  2.  Substance abuse.  3.  Gastritis.   HISTORY OF PRESENT ILLNESS:  John Cox is a 40 year old African-American  gentleman admitted to the emergency department at [facility], where  he presented with a few days of deformity and abdominal pain.  It has been  progressively worsening.  He admitted to using cocaine over the past few  days prior to onset of symptoms and had not been using his medications as  prescribed.  He does admit to noncompliance with his medications on his  visit, which he attributes to his homelessness and having to move from place  to place.  In the emergency room, he was noted to be lethargic, disoriented,  and dehydrated.  His initial CBG was over 500 with a pH of 7.28.  Serum  acetone was moderate.  Bicarbonate was 16.  He was thus admitted for  diabetic ketoacidosis and started on intravenous insulin and Glucomander  management.   HOSPITAL COURSE:  On admission, the patient was started on intravenous  fluids, IV Insulin via Glucomander management.  His urinalysis was negative,  but urine drug screen was positive for cocaine.   EKG showed left ventricular hypertrophy.   His alcohol level was less than 5.  His amylase was 38.  Lipase 14.  Initial  electrolyte studies showed sodium 104 with potassium 4.6.  Chloride 96.  Bicarbonate 13.  BUN 24, creatinine 2.2, glucose 418.  He was resumed on his  usual home antidiabetic therapy of Amaryl 4 mg once daily, Lantus insulin 40  units q.h.s.   His symptoms improved significantly within 24 hours.   His white count was up  to 21.  His pH was up to 7.35, BUN 17, creatinine 0.6.   Social work consult was requested, and the patient claimed that he had a  place to go to.  He did have an episode of penile burning pain and urethral  discharge.  He was treated empirically as nongonococcal urethritis with  Zithromax 2 gm with resolution of his symptoms.   On the morning of Jan 25, 2005, he was feeling much better.  The urethral  discharge and burning had resolved.  His glucose was 160.  He was well-  hydrated.  He was therefore considered stable for discharge.   DISCHARGE DIAGNOSES:  1.  Diabetic ketoacidosis, now resolved.  2.  Type 2 diabetes mellitus, under control.  3.  Nongonococcal urethritis.  4.  HIV negative.  5.  RPR negative.  6.  History of substance abuse.  7.  Homelessness.  8.  Dehydration, now resolved.  9.  Renal insufficiency, resolved.   DISCHARGE MEDICATIONS:  1.  Lantus insulin 50 units q.h.s.  2.  Amaryl 4 mg daily.  3.  NovoLog insulin sliding-scale insulin as directed.   CONDITION ON DISCHARGE:  Stable.   DISPOSITION:  Home.      Fleet Contras, M.D.  Electronically Signed     EA/MEDQ  D:  03/26/2005  T:  03/26/2005  Job:  [id number]

## 2011-01-17 NOTE — Discharge Summary (Signed)
NAME:  John Cox, John Cox NO.:  [id number]   MEDICAL RECORD NO.:  [id number]                   PATIENT TYPE:  INP   LOCATION:                                       FACILITY:  [facility]   PHYSICIAN:  Wayne A. Sheffield Slider, M.D.                 DATE OF BIRTH:  [birth year]   DATE OF ADMISSION:  04/07/2004  DATE OF DISCHARGE:  04/09/2004                                 DISCHARGE SUMMARY   ADMISSION DIAGNOSIS:  Diabetic ketoacidosis.   DISCHARGE DIAGNOSES:  1. Diabetic ketoacidosis.  2. Diabetes mellitus.  3. Substance use.   SERVICE:  Conservation officer, historic buildings.   ATTENDING PHYSICIAN:  Dr. Sheffield Slider   RESIDENT:  Dr. Robet Leu MEDICATIONS:  1. Lantus 50 units q.h.s.  2. Glucotrol 10 mg daily.  3. Sudafed Decongestant 30 mg p.o. q.6 h. p.r.n.   HISTORY AND PHYSICAL:  The patient is a 40 year old African-American male  with diabetes that was diagnosed 6 years ago.  He presented with vomiting  and weakness x12 hours.  The patient admits noncompliance, does not check  his blood sugars, and skipped his Lantus dose as well as drank several beers  over the weekend.  On admission, his vitals were normal.  His physical exam  showed a drowsy/lethargic, well-developed, well-nourished male who reports  blurred vision, numbness bilaterally in lower extremities, positive  polyuria, positive polydipsia, positive weight loss about 5 to 10 pounds in  the last month, negative for abdominal pain.  Please see History and  Physical for further workup.  The patient was admitted to the hospital, put  on an insulin drip, was rehydrated with IV fluids.  Blood sugars were  checked q. hour as well as a BMP with magnesium and phosphorus.  Consults  were ordered for smoking cessation as well as diabetes education.  The  patient did well, vitals remained stable.  His sugars normalized and he was  taken off the drip, off IV fluids, and put on Lantus.  The patient also  complained  of headache during hospital stay which we think he had secondary  to DKA, thus the patient was discharged with the [facility] Decongestant.  Glucotrol was also added to his discharge medications.   DISCHARGE DATE:  Low carb, no sugar.   DISCHARGE CONDITION:  Stable.   DISCHARGE INSTRUCTIONS:  Check blood sugars no less than twice a day and  follow up at [facility] April 12, 2004 at 10 a.m. with Duke Salvia.  Activity ad lib.      Tawnya Crook Erenest Rasher, M.D.               Wayne A. Sheffield Slider, M.D.    MBV/MEDQ  D:  04/09/2004  T:  04/10/2004  Job:  [id number]   cc:   Duke Salvia  [facility]  Work # [phone]

## 2011-01-17 NOTE — H&P (Signed)
NAME:  John Cox, John Cox NO.:  [id number]   MEDICAL RECORD NO.:  [id number]          PATIENT TYPE:  EMS   LOCATION:  ED                           FACILITY:  [facility]   PHYSICIAN:  Hollice Espy, M.D.DATE OF BIRTH:  1970/10/23   DATE OF ADMISSION:  02/03/2005  DATE OF DISCHARGE:                                HISTORY & PHYSICAL   PRIMARY CARE PHYSICIAN:  The patient had no PCP but gets his medications  filled at a health clinic in [place].   CHIEF COMPLAINT:  Abdominal pain and found to be in DKA.   HISTORY OF PRESENT ILLNESS:  The patient is a 40 year old African American  male with a past medical history of diabetes mellitus x7 years, medical  noncompliance, and polysubstance abuse, who presents after feeling rough  for several days.  He tells me that his insulin ran out a couple of days ago  and he has not taken anything since then.  He began to feel worse and worse  with nausea and vomiting and finally could not take it anymore and came into  the emergency room for further evaluation at [facility].  The patient was  found to have a normal white count and no shift.  However, concerning was  his bicarbonate level of 6, a glucose of 530, and a BUN and creatinine of 23  and 2.2.  The patient was found to have an anion gap of 31.  The patient had  moderate serum acetones.  The patient was admitted, given 8 units of  subcutaneous insulin, as well as 1-1/2 L of fluids.  Currently he is  complaining of some abdominal pain and hurting and tired all over.  He  denies any headaches or visual changes.  He denies any chest pain,  palpitations, shortness of breath, wheeze, cough, hematuria, dysuria,  constipation, or diarrhea.  The patient is drowsy but arousable and answers  questions appropriately.   REVIEW OF SYSTEMS:  Otherwise negative.   PAST MEDICAL HISTORY:  1.  Diabetes mellitus x7 years.  2.  Medical noncompliance.  3.  Polysubstance abuse.   MEDICATIONS:  The patient is on:  1.  Lantus insulin 50 units subcutaneously q.h.s.  2.  Novolog sliding scale.   ALLERGIES:  HE HAS NO KNOWN DRUG ALLERGIES.   SOCIAL HISTORY:  He says drinks occasionally and smokes occasionally.  He  admits to marijuana and cocaine use.   FAMILY HISTORY:  Noncontributory.   PHYSICAL EXAMINATION:  VITAL SIGNS:  On admission temperature 97.6, heart  rate 105, blood pressure 148/72, respirations 22.  O2 saturation is 100% on  room air.  GENERAL:  The patient is drowsy but arousable.  He is oriented x3.  HEENT:  Normocephalic atraumatic.  His mucous membranes are dry.  NECK:  He has no carotid bruits.  HEART:  Mildly tachycardiac.  Regular rhythm.  LUNGS:  Clear to auscultation bilaterally.  ABDOMEN:  Soft.  Some mild diffuse tenderness.  Positive bowel sounds.  Nondistended.  EXTREMITIES:  Show no cyanosis, clubbing, or edema.   LABORATORY WORK:  White count 7.5  with no shift.  H&H 14.6 and 42.9.  MCV of  84.  Platelet count of 534.  Sodium 132, potassium 5, chloride 95,  bicarbonate 60, BUN 22, creatinine 2.2, glucose 530, calcium 9.4.  Moderate  serum acetone.  UA shows greater than 100,000 glucose, trace hemoglobin,  greater than 80 ketones, 30 protein.  pH 7.12, PCO2 of 11.5, PO2 121,  bicarbonate 3.6.  O2 saturation 97.3%.   ASSESSMENT/PLAN:  1.  Diabetic ketoacidosis secondary to medical noncompliance with insulin.      We will give the patient two amps of bicarbonate, given his dismally low      bicarbonate level.  Start an insulin drip plus IV fluids.  Once his      anion gap has corrected we will change him over to subcutaneous insulin      and allow him to eat.  We will put him in the step down unit for close      observation.  2.  Polysubstance abuse.  3.  Medical noncompliance.  4.  Tobacco abuse.      SKK/MEDQ  D:  02/03/2005  T:  02/03/2005  Job:  [id number]

## 2011-01-17 NOTE — Discharge Summary (Signed)
[place]  Patient:    John Cox, John Cox Visit Number: [id number] MRN: [id number]          Service Type: MED Location: [phone] Attending Physician:  Garnette Scheuermann Dictated by:   Emelda Fear, M.D. Admit Date:  02/12/2002 Discharge Date: 02/17/2002                             Discharge Summary  DATE OF BIRTH:  1970-09-26  CONSULTS: None.  PROCEDURES:  None.  DISCHARGE DIAGNOSES: 1. Diabetic ketoacidosis. 2. Diabetes mellitus type 1. 3. Polysubstance abuse including alcoholism. 4. Preauricular abscess. 5. Folliculitis. 6. History of noncompliance.  DISCHARGE MEDICATIONS: 1. Lantus 45 units subcutaneous q.a.m. 2. Aspirin 81 mg one tablet p.o. q.d. 3. Lisinopril 5 mg one tablet p.o. q.d. 4. Keflex 500 mg one tablet p.o. b.i.d. x2 days.  FOLLOW-UP:  The patient has a follow up appointment on [facility] on Thursday February 17, 2002 at 11:45 a.m.  HOSPITAL COURSE:  John Cox is a 40 year old African-American male with past medical history significant for diabetes mellitus type 1 and polysubstance abuse presenting secondary to vomiting as well as elevated blood sugars.  The patient was found to be in diabetic ketoacidosis on presentation.  The patient was admitted to the hospital and received insulin drip as well as aggressive IV hydration.  Resolution of diabetic ketoacidosis occurred adequately during hospitalization.  Due to the patients history of noncompliance with care the patient was maintained as an inpatient status to establish an appropriate diabetic regimen for him.  The patient was initiated on Lantus therapy with adequate glucose control on approximately hospital day four.  The patient also received extensive education on diabetes as well as how to use a glucometer during his hospitalization.  Follow up with [facility] was established with this patient during his hospitalization.  Also of note, the patient  was initiated on aspirin therapy as well as Lisinopril therapy secondary to diabetes for approximately ten years.  Also of note, the patient was noted to have a preauricular abscess as well as folliculitis during hospitalization. Preauricular abscess was drained and the patient was initially on Keflex therapy during hospitalization.  The patient also admitted to alcoholism as well as cocaine use with a positive urine drug screen upon presentation.  Case management met with patient and provided counselling regarding rehab facilities in the area.  The patient agreed to start attending meetings upon discharge.  DISCHARGE PROBLEM LIST: 1. Diabetic ketoacidosis, resolved with insulin drip as well as aggressive    intravenous hydration, secondary to noncompliance and patient running    out of insulin approximately one week prior to presentation. 2. Diabetes mellitus type 1.  The patient with approximately ten year history    of diabetes.  The patient was initiated on new insulin regimen during    hospitalization which included Lantus 45 units every morning.  The patient    quite noncompliant in the past and rarely checks sugars. Goal is to have    a baseline insulin throughout the day which Lantus appears to be ideal for.    If the patient becomes enthusiastic about his diagnosis in the future, we    will consider initiating regular insulin at mealtime.  Our goal during    this hospitalization was actually to get patient to check his sugars on a    regular basis.  The patient was also initiated on  aspirin therapy as well    as Lisinopril therapy during hospitalization.  The patient was counselled    on how these therapies are cardioprotective as well as renal protective    in nature. 3. Polysubstance abuse.  The patient was with a known history of alcoholism as    well as cocaine use.  The patient met with social worker during    hospitalization and the patient declined inpatient rehab at this  time;    however, stated he would be interested in attending meetings once    discharged from the hospital.  The patient was initiated on Librium    protocol taper during his hospitalization to prevent alcohol withdraw.    No signs or symptoms of alcohol withdraw occurred during hospitalization. 4. Preauricular abscess as well folliculitis.  Preauricular abscess was    drained during hospitalization and the patient was initiated on Keflex    therapy.  The patient will be discharged on two more days of Keflex    therapy.  DISCHARGE RECOMMENDATIONS:  ACTIVITY:  No restrictions.  DIET:  Recommended low sugar diabetic diet.  WOUND CARE:  Not applicable.  SPECIAL INSTRUCTIONS:  The patient is instructed to call physician for development of fever, vomiting, or sugars greater than 350.  The patient knew the importance of checking blood sugars at least twice daily until ideal diabetic regimen is determined. Dictated by:   Emelda Fear, M.D. Attending Physician:  Garnette Scheuermann DD:  02/17/02 TD:  02/18/02 Job: [id number]

## 2011-01-17 NOTE — Discharge Summary (Signed)
NAME:  FREDICK, SCHLOSSER NO.:  [id number]   MEDICAL RECORD NO.:  [id number]          PATIENT TYPE:  INP   LOCATION:  6733                         FACILITY:  [facility]   PHYSICIAN:  Jonna L. Robb Matar, M.D.DATE OF BIRTH:  [birth year]   DATE OF ADMISSION:  06/15/2005  DATE OF DISCHARGE:  06/20/2005                                 DISCHARGE SUMMARY   FINAL DIAGNOSIS:  1.  Diabetic ketoacidosis.  2.  Cocaine and tobacco abuse.  3.  Alcoholism.   Primary care physician unassigned, goes to the outpatient clinic.   OPERATION:  None.   ALLERGIES:  None.   CODE STATUS:  Full.   HISTORY:  This 40 year old poorly compliant African American male presents  with another in a series of multiple admissions for diabetic ketoacidosis.  He is very intermittent about taking his insulin.  Physical exam was notable  for mild hypotension at 94/56.  He had ketones on his breath, lethargy, dry  mucous membranes.   HOSPITAL COURSE:  The patient had IV hydration and insulin drip.  Over the  course of the next few days, he resolved his ketosis, although he still had  hypoglycemia and hyperglycemia.  His A1C was 12.1.  Urinalysis was  unremarkable.  CBC was unremarkable.   DISPOSITION:  The patient will be discharged on Lantus 30 units at h.s.,  Protonix 40 units daily, and NovoLog as needed on a sliding scale.  He has  been advised to eat regular amounts of food at regular times and take his  insulin faithfully everyday.  He is to call the outpatient clinic and be  seen in one week.      Jonna L. Robb Matar, M.D.  Electronically Signed     JLB/MEDQ  D:  06/20/2005  T:  06/20/2005  Job:  [id number]

## 2011-01-17 NOTE — Discharge Summary (Signed)
NAME:  John Cox, John Cox NO.:  [id number]   MEDICAL RECORD NO.:  [id number]          PATIENT TYPE:  INP   LOCATION:  5504                         FACILITY:  [facility]   PHYSICIAN:  Alvester Morin, M.D.  DATE OF BIRTH:  10-Dec-1970   DATE OF ADMISSION:  12/13/2005  DATE OF DISCHARGE:                                 DISCHARGE SUMMARY   DISCHARGE DIAGNOSES:  1.  Diabetes mellitus type 1 diagnosed in 1998.  Hemoglobin A1c of 12.6 in      December 2006.  2.  Diabetic ketoacidosis, episode #9 since May 2006.  3.  Bilateral pulmonary embolus in December 2006 secondary to      antiphospholipid antibody, on Coumadin therapy.  4.  History of right deep vein thrombosis in December 2006.  5.  History of depression with suicidal intent.  6.  Polysubstance abuse since 40 years old including marijuana, cocaine, and      alcohol.   DISCHARGE MEDICATIONS:  1.  Lantus insulin 15 units subcu q.h.s.  2.  Coumadin 5 mg p.o. daily each evening until Thursday, December 18, 2005,      when he will see Dr. Shaune Leeks at the outpatient clinic for Coumadin      monitoring.  3.  Lovenox 80 mg subcu b.i.d. until Thursday, April 19 for bridging      therapy.   PROCEDURES:  1.  CT angiogram of the chest was performed on April 14 and showed no      pulmonary embolus.  2.  Lower extremity venous Doppler on April 15 was negative for DVT, SVT, or      Bakers cyst bilaterally.  3.  A 2-D echocardiogram was performed on April 15; results are still      pending.   DISPOSITION AT DISCHARGE:  The patient was discharged to home in an  asymptomatic state.  He was given eight syringes of Lovenox for bridging  therapy as well as a supply of Lantus insulin and Coumadin 5 mg to last him  until he can be followed in the outpatient clinic.  He will see Dr. Shaune Leeks in the Coumadin clinic on December 18, 2005, at 2:30 p.m. for Coumadin  adjustment and will see Dr. Luiz Iron in the outpatient clinic on Monday,  December 22, 2005, at 2 p.m.  When he sees Dr. Luiz Iron it is very important to make  sure that he has enough Lantus insulin to last until Concepcion Elk can have  a supply from the patient assistance program, the process of which was  started on the day of discharge.   ADMITTING HISTORY AND PHYSICAL:  [name] is a 40 year old African-American  man with a past medical history of uncontrolled diabetes type 1 with  nonadherence to his medication and bilateral pulmonary emboli secondary to  antiphospholipid syndrome as well as polysubstance abuse.  He presented with  a complaints of midsternal pleuritic chest pain for 2 days prior to  admission with shortness of breath at rest and nonproductive cough.  There  was no radiation of the pain, no palpable,  diaphoresis.  The pain was  constant and worse on palpation.  He denied any fever or chills but did  endorse right lower extremity tenderness and swelling for 2 days prior to  admission.  He mentioned that he had been in jail from early January to late  February and had not been taking Coumadin since his last discharge in  December for financial reasons.  As a second complaint he also endorsed  nausea/vomiting of green material since the night prior to admission without  abdominal pain, hematemesis, bright red blood per rectum, or melena.  He  also denied diarrhea or constipation but he did complain of polyuria,  headache, and blurred vision since Monday, April 8, secondary to stopping  Lantus insulin for financial problems.  On physical exam he had a  temperature of 98.9, blood pressure 128/53, heart rate 85, respiratory rate  20, saturating at 98% on room air.  He was in no acute distress, was awake  and alert.  His pupils were about 4 mm bilaterally and poorly reactive  bilaterally.  His oropharynx was clear, neck was supple.  On  lung exam he  had fair air movement bilaterally with rhonchi in the anterior upper chest.  There was no crackles or  wheezing.  His heart exam was unremarkable.  Abdomen exam was normal.  His right lower leg was slightly bigger than the  left but there was no palpable cord or erythema. His right calf was tender  with a positive Homans sign.  His skin was warm without rash or eruptions.  His neurologic exam was grossly normal and nonfocal.  He had a flat affect.  He had a WBC of 11.9, hemoglobin 14.4, platelets 370.  Sodium 135, potassium  4.3, chloride 104, CO2 15, BUN 20, creatinine 1.6, glucose 303.  He had an  anion gap of 16, bilirubin 2.0, alkaline phosphatase 80, AST 16, ALT 21,  protein 7.2, albumin 4.2, calcium 9.0, magnesium 2.2.  Lactic acid level was  1.2.  His cardiac enzymes were negative.  He had no changes on EKG since the  previous one except mild QT prolongation.  His PT was 13.9, INR 1.1.  His  arterial blood gas showed a pH of 7.25, pCO2 of 32, pO2 91, and bicarbonate  is 14.1.  This was done on room air.  He had moderate ketones in his blood  and a urinalysis showed glucose over 1000, no blood, no nitrites, and no  leukocyte esterase, and ketones over 80.  A CT angiogram of the chest showed  no evidence of PE.   HOSPITAL COURSE:  #1  - CHEST PAIN.  As stated above, a CT angiogram of the  chest was negative for pulmonary emboli.  Cardiac enzymes were cycled and  were all negative.  A urine drug screen was positive for cocaine and the  pain was reproducible on palpation.  The pain was believed to be secondary  to either cocaine use or costochondritis.   #2 - DIABETIC KETOACIDOSIS.  The patient was treated with short-term insulin  drip as well as fluid and potassium replacement.  His anion gap closed  rapidly and his blood sugar decreased very nicely.  He was restarted on  subcutaneous Lantus insulin and will be discharged with Lantus insulin 50  units subcu daily.   #3 - HISTORY OF PULMONARY EMBOLI.  The patient had not been taking since his discharge in December 2006.  It was  reiterated with the patient that it  is  very important to follow his Coumadin therapy to avoid any recurrent blood  clots.  During his hospitalization he was treated with Lovenox 80 mg subcu  b.i.d. and he had two doses of Coumadin 10 mg q.h.s.  His INR on discharge  was 1.6.  He was given Coumadin 5 mg to take daily until he sees Dr. Shaune Leeks in the Coumadin clinic, who will then adjust the Coumadin dosing.   #4 - RIGHT LOWER EXTREMITY EDEMA AND PAIN.  A lower extremity Doppler was  performed which was negative for DVT, SVT, or Bakers cyst.  On the day of  discharge he still had some residual tenderness but the edema was completely  gone.  This pain was felt to be secondary to his previous distal vein  thrombosis and the patient was reassured.  I also suggested that if the  swelling recurs to elevate his leg.   #5 - FINANCIAL ISSUES.  The patient mentioned that he had been restarted on  Medicaid for about 2 months but that it would not pay for his medication.  I  called his pharmacy, [facility] on [address], to clarify this and they  said that the patient was apparently on the family planning Medicaid plan  which is basically for pregnancy-related medical needs and STDs only.  Upon  questioning, it was discovered that the patient had applied for this  Medicaid when he had custody of his young son.  However, his son is now 44  years old and not in school anymore and this Medicaid does not pay for any  of his medicines, hospitalizations, or doctors visits.  I suggested to the  patient to cancel this Medicaid as it is preventing him from getting on  other plans, and I also arranged with Concepcion Elk from the outpatient  clinic to provide him with medications.  She will provide him Lantus insulin  samples to take 50 units subcu daily and will arrange for him to be set up  on a patient assistance program for Lantus insulin.  We will  provide samples for him until this can be arranged.   She will also provide  Coumadin and Lovenox for him.  This needs to be followed very closely by his  primary care physician to avoid an unfortunate event like the one that just  happened.      Dellia Beckwith, M.D.      Alvester Morin, M.D.  Electronically Signed    VD/MEDQ  D:  12/15/2005  T:  12/15/2005  Job:  [id number]   cc:   Dennis Bast, MD  Fax: [phone]   Outpatient Clinic, Attn:  Dr. Michaell Cowing

## 2011-01-17 NOTE — H&P (Signed)
[facility]  Patient:    John Cox, John Cox                      MRN: [id number] Adm. Date:  [id number] Attending:  Anastasio Auerbach CC:         [facility]   History and Physical  DATE OF BIRTH:  August 17, 1971  CHIEF COMPLAINT:  "My stomach hurts."  HISTORY OF PRESENT ILLNESS:  John Cox is a 40 year old African-American gentleman who tells me he was diagnosed with diabetes a year and a half ago. He says that he was initially on oral medications but was subsequently switched to insulin.  He has not kept good followup with [facility] and ran out of his insulin approximately three weeks ago.  Since that time, he has had progressive weakness and over the past three to four days, has had epigastric abdominal pain and vomiting.  He has had coffee-grounds emesis.  No melena or bright red blood per rectum.  He denies fevers.  He has had a painful tooth on the right upper side.  He says a piece of it broke off the other day.  He has felt chilled but has not documented any fevers.  His medical history is also significant for cocaine use as recently as four days ago.  He uses alcohol three to four days out of the week and smokes cigarettes.  ALLERGIES:  No known drug allergies.  CURRENT MEDICATIONS:  None.  SOCIAL HISTORY:  The patient is single and lives in [place].  He recently moved in with his mother two weeks ago.  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  He smokes a half pack per day and drinks three to four days out of the weeks.  Sometimes he gets intoxicated, sometimes just casual intake.  He uses cocaine, most recently four days ago.  He denies any history of IV drug use.  FAMILY HISTORY:  He said his mother has diabetes.  There is a family history of hypertension and cancer on his mothers side.  REVIEW OF SYSTEMS:  The patient complains of right upper tooth pain.  He has no visual changes.  He has had a steady weight loss  since he was diagnosed with diabetes a year and a half ago.  He tells me he had to be admitted to the hospital at that time but I cannot find any old records indicating such.  He said it may have been here at [facility].  He denies any chest pain or shortness of breath.  He has no change in bowel habits.  No difficulty urinating.  No penile discharge.  No joint swelling or edema.  Review of systems except for stated above is negative.  PHYSICAL EXAMINATION:  GENERAL:  John Cox is somewhat lethargic but responds appropriately when questioned.  He is easily arousable.  He is in mild distress.  VITAL SIGNS:  Temperature 98.0, BP 131/98, heart rate 88, respiratory rate 20.  HEENT:  Atraumatic.  Normocephalic.  Cranial nerves II-XII grossly intact. Sclerae clear.  Conjunctivae pink.  Mild temporal wasting.  Mucous membranes dry.  NECK:  Supple.  No adenopathy.  No mass.  No audible bruits.  No JVD.  LUNGS:  Clear bilaterally to auscultation.  Good air movement.  No wheezes, crackles or rales.  HEART:  Regular.  Normal S1, S2.  No audible murmurs, rubs or gallops.  ABDOMEN:  Bowel sounds present.  Nondistended.  Patient has  midepigastric tenderness with palpation.  Mild rebound tenderness.  MUSCULOSKELETAL:  No fasciculations.  No edema or cords.  The patient is thin but there is no obvious muscle wasting.  NEUROLOGIC:  The patient is easily aroused.  He is oriented x 3.  He gives a reasonable history.  His motor exam reveals 5/5 strength bilaterally. Sensation grossly intact.  Reflexes 2+ bilaterally.  Toes downgoing.  Gait not assessed.  LABORATORY DATA:  Hemoglobin 16.6, WBC 8500, platelet count 288,000, absolute neutrophil count 7000.  Sodium 134, potassium 4.9, chloride 97, bicarb 12, glucose 311, BUN 25, creatinine 1.7, total bilirubin 2.2, alkaline phosphatase 125, SGOT 16, SGPT 35, total protein 9.4, albumin 4.8.  Amylase 16.  Lipase 11.  Urinalysis:  Specific gravity  1.035, greater than 1000 glucose, greater than 80 ketones, 100 protein.  Blood acetone registered as moderate.  Urine drug screen positive for cocaine.  Lactic acid level 1.0.  Of interest, the patient was seen in the emergency department one day prior. At that time, his hemoglobin was 18, his bicarb was 15, sodium 136, potassium 5.7, chloride 107 and glucose 348.  IMPRESSION: 1. Diabetic ketoacidosis.  John Cox is a 40 year old African-American    gentleman with a diagnosis of diabetes for a year and a half who presents    with diabetic ketoacidosis.  He was on oral medications to begin with but    subsequently required insulin.  I am not sure if he had diabetic    ketoacidosis on his initial admission, but he certainly does now.  I    suspect this has been precipitated by noncompliance with his insulin -- he    has not been taking it for three weeks -- and also by this potential tooth    problem.  He may have an underlying abscess.  At this point, we will admit    him to the step-down unit where he can be closely monitored.  We will start    him on the Glucommander and check capillary blood glucoses every hour.  We    will continue insulin until his gap is corrected; on admission, it was 25.    If his sugars fall below 150, we will start adding D-5 or D-10 intravenous    fluids.  We will repeat basic metabolic panel at 3 p.m. as well as 9 p.m.    and then again the morning.  His potassium is currently okay.  I will    follow it up in approximately four hours and add it to intravenous fluids    as needed.  He has received a significant amount of intravenous fluids in    the emergency room as well as a loading dose of 10 units of intravenous    insulin.  We will educate on compliance in future days. 2. Nausea, vomiting and abdominal pain.  He could have had something in his    abdomen precipitate this.  I do not get a sense of clear right upper    quadrant tenderness, it is more  epigastric and periumbilical.  I will do a     CT scan to rule out underlying etiologies.  I suspect it may be due to the    diabetic ketoacidosis and vomiting.  He does describe some coffee-grounds    emesis and although his hemoglobin is stable, I think he needs to be on a    proton pump inhibitor for the time-being, especially given his alcohol use.    Because  he does have a history of cocaine use, I think certainly bowel    ischemia is a possibility, although I would expect him to have bloody    diarrhea with this. 3. Painful tooth.  We will check an orthopanogram to rule out abscess.  If    there are any abnormalities, I will antibiotics.  Pain control with oral    Vicodin and intravenous morphine. 4. Cocaine use.  Counseled on the negative impact of cocaine use.  Will have    social work follow up. 5. Alcohol use.  Although this is intermittent, it may be more of a problem    than he is indicating.  I will continue questioning and follow up. 6. Tobacco use.  I will offer him a nicotine patch.  Encourage smoking    cessation. DD:  04/02/01 TD:  04/04/01 Job: [id number]

## 2011-01-17 NOTE — H&P (Signed)
NAME:  John Cox, John Cox NO.:  [id number]   MEDICAL RECORD NO.:  [id number]          PATIENT TYPE:  INP   LOCATION:  3316                         FACILITY:  [facility]   PHYSICIAN:  Rodena Goldmann, MD   DATE OF BIRTH:  12/25/1970   DATE OF ADMISSION:  10/26/2006  DATE OF DISCHARGE:                              HISTORY & PHYSICAL   CHIEF COMPLAINT:  Abdominal and chest pain.   HISTORY OF PRESENT ILLNESS:  Ms. Witts is a 40 year old African American  male patient with history of diabetes mellitus type 2, noncompliant with  medication brought by EMS to the ED with main complaint of abdominal and  chest pain described as constant in precordia, left side abdominal pain,  crampy, diffuse in the abdomen, patient also vomiting liquid brownish  content.  Patient has been sleeping during interview, but arousable,  answering questions and following commands.  Per report, he was not  using his insulin for the past four days because he was on drugs.  He  did not have followup appointment after recent discharge on October 09, 2006, treated for DKA.   PAST MEDICAL HISTORY:  1. Diabetes mellitus type 1, noncompliant, recent DKA admission in      October of 2007.  2. Antiphospholipid antibody syndrome.  3. Pulmonary embolus secondary to #2 on August 21, 2005, with CT      angiogram that revealed large bilateral central PE.  Followup CT on      December 13, 2005, revealed resolved PE with residual mural thrombus.      Patient noncompliant with Coumadin.  4. Depression.  5. Polysubstance abuse, marijuana, cocaine and tobacco.   MEDICATIONS:  Include:  1. Lantus 30 units subcutaneous q.h.s.  2. Humulin R SSI.   ALLERGIES:  NKDA.   SOCIAL HISTORY:  Smokes marijuana, use cocaine.   FAMILY HISTORY:  Mother 48 years old with diabetes.  Father 47 years  old, healthy.   REVIEW OF SYSTEMS:  CARDIOVASCULAR:  Chest pain, tender to touch and  exacerbated by movement, constant.  GI:   Vomiting, constipation x5 days.   LABORATORY AND EXAMS IN THE ED:  Alcohol level less than 5 mg/dl,  gastric occult blood positive, urine drug screen cocaine positive.  Lipase 18, myoglobin 111, CK-MB 3.9, troponin I less than 0.05.  White  blood cells 8.2, hemoglobin 13.9, hematocrit 42.3, platelets 212, sodium  129, potassium 5.4, chloride 100, CO2 35, BUN 15, creatinine 1.1,  glucose 601, corrected sodium 137, anion gap less than -6, liver  function tests, ABG and urinalysis pending.   Chest x-ray:  No acute findings.   CT angiogram of the chest:  No definite thrombus, poor-quality exam.   EKG:  Normal sinus rhythm with right axis deviation, biventricular  hypertrophy and prolonged QT.   PHYSICAL EXAMINATION:  VITAL SIGNS:  Temperature 98.7, heart rate 89,  respiratory rate 18, blood pressure 125/38, O2 98% on room air.  GENERAL:  Patient lying on bed, lateral decubitus, __________ asleep but  arousable, status post morphine and Zofran.  NECK:  Supple, nontender.  No lymphadenopathy.  No JVD.  HEENT:  Pupils equal and reactive to light and accommodation.  Oropharynx normal.  Poor dental hygiene.  CVS:  Hyperactive precordium.  S1, S2 normal.  There may be a 2/6  systolic ejection murmur in the right upper sternum, but unsure about it  and no rubs or gallops.  RESPIRATORY:  Clear to auscultation bilaterally.  ABDOMEN:  Positive bowel sounds, diffusely tender to palpation, no  hepatosplenomegaly, no guarding, no rebound.  EXTREMITIES:  No edema, no cyanosis.  NEURO:  Patient is sleeping, but arousable, answering questions,  following commands.   ASSESSMENT/PLAN:  A 40 year old African American male admitted with:  1. Hyperglycemia, probably DKA.  Will await ABG results, place on      Glucommander with a range of 110 to 140, 0.1 sensitivity multiply      it to keep CBGs 150 to 200, once falls less than 150 may      discontinue, Glucommander and start a sliding-scale insulin       sensitive.  Patient received 500 mL normal bolus, will repeat and      continue IV fluids at half-normal saline at 150 mL per hour, change      to D5 half-normal saline once CBG less than 250, add potassium      chloride 20 mEq per liter once potassium within 4.  Check CMP,      phosphate, magnesium, BNP q.2 h.  Correct the electrolytes as      needed.  Will be cautious with __________chest pain, history of      cocaine abuse and no previous echocardiogram or cardiac evaluation,      also check I&Os since patient refused Foley catheter.  2. Vomiting and abdominal pain secondary to hyperglycemia, rule out      pancreatitis, that can be in the differential, but there is no      acute abdomen at this point.  Liver functions are pending.  Lipase      normal.  We will keep NPO, NG tube as needed to replace fluids and      check fasting lipid panel in the morning.  Patient refused an NG-      tube.  3. Chest pain in this patient with cocaine abuse.  Chest pain may be      induced by the drugs, but will rule out acute coronary syndrome      with cycling cardiac enzymes and ECG ordered, 2-D echo in the      morning, morphine 2 mg IV p.r.n. pain, O2 if needed.  Pulmonary      embolus could also be a cause off chest pain, mostly in this      patient who has had a history of previous pulmonary embolus in      December of 2006 not on Coumadin, mildly tachycardic, but      nonhypoxic.  The CT angiogram was inconclusive, will repeat CT      angiogram.  Would increase IV contrast per radiologist      recommendation, pre-treat with prophylaxis for IV contrast-induced      nephropathy with sodium bicarb 150 mg and one liter half-normal      saline at 140 mL per hour first hour and then 80 mL per hour next      six hours.  If pulmonary embolism diagnosed will start heparin, but      consult GI in the a.m. since patient has positive hematemesis. 4. Tachycardia, may be secondary to  dehydration secondary  to      hyperglycemia.  Will increase IV fluids, will also check TSH in the      morning.  5. Polysubstance abuse to be addressed tomorrow after acute disease      controlled.  6. Acute renal failure.  7. Creatinine 0.79 on October 06, 2006, secondary to dehydration.      Will replace fluids and pre-treat for IV contrast nephropathy,      change management as needed, check BNP, phosphate and magnesium q.2      h.  8. Prophylaxis.  Protonix 40 mg IV b.i.d. and SCDs.  9. Increased transaminases and bilirubin.  Will follow with CMP in the      morning after hydration, if still elevated, may order acute      hepatitis panel and right upper quadrant ultrasound.      Adrian Blackwater, MD    ______________________________  Rodena Goldmann, MD    IM/MEDQ  D:  10/27/2006  T:  10/27/2006  Job:  [id number]

## 2011-01-17 NOTE — Discharge Summary (Signed)
NAME:  John Cox, John Cox NO.:  [id number]   MEDICAL RECORD NO.:  [id number]          PATIENT TYPE:  INP   LOCATION:  4741                         FACILITY:  [facility]   PHYSICIAN:  Johney Maine, M.D.   DATE OF BIRTH:  11/09/1970   DATE OF ADMISSION:  10/26/2006  DATE OF DISCHARGE:  10/29/2006                               DISCHARGE SUMMARY   ADMITTING DIAGNOSES:  1. Hyperglycemia, rule out diabetic ketoacidosis.  2. Vomiting.  3. Abdominal pain.  4. Chest pain.  5. Tachycardia.  6. Cocaine positive.  7. Antiphospholipid antibody.   DISCHARGE DIAGNOSES:  1. Uncontrolled diabetes.  2. Abdominal pain, resolving.  3. Constipation.  4. Substance abuse.  5. Antiphospholipid antibody.   See H&P for details, but briefly, this is a 40 year old gentleman who  was admitted from the Emergency Room for hyperglycemia and possible DKA.  He had recently been discharged earlier in the month for similar  problems.  He is known to be noncompliant with his diabetes medication  including his insulin.  He also has not been taking his insulin because  he has been using cocaine over the past few days.  He presented with  vomiting, severe, and left-sided abdominal pain for a period of a couple  of days.  On his hospital course, it was found out that he did not have  DKA.  We treated him for his hyperglycemia and hydrated him with lots of  fluids.  His abdominal pain continued and it was most likely secondary  to constipation.  We gave him Dulcolax suppositories which caused him to  have a bowel movement and improved his pain, and on the day of discharge  he was improved.  Please see following for details.   PROBLEM LIST:  1. Hyperglycemia:  Once labs came back, it was noted that the patient      was not in DKA.  What we did is we essentially gave him lots of IV      fluids and drew BMET every two hours to make sure he was not having      electrolyte abnormalities.  We started the  patient on a      Glucommander at admission to keep his sugars between 110 and 140      and to figure out sensitivities.  He tolerated this very well,      although did have some sugars in the 300s.  Once his sugars      corrected, we switched him over to the appropriate amount of      Lantus.  During his hospital course, we continued to have him to      increase his Lantus. As his sugars continued to be elevated, on day      of discharge we had him on Lantus 25 units every night.  His      hyperglycemia did correct and his sugars were only in the 100s to      200s by day of discharge.  He seemed to understand how to use his      insulin and will set  up with primary care physician on discharge.  2. Vomiting:  This patient's vomiting subsided once IV fluids were      given.  He no longer vomited throughout his hospital stay.  His      electrolytes corrected.  3. Abdominal pain: Left-sided abdominal pain which continued      throughout his hospital stay. On imaging, did not indicate any      problem.  However, the patient was very constipated.  He did have      some relief with bowel movement on date of discharge after we gave      a suppository.  Patient refused NG tube and enemas throughout his      hospital stay.  4. Chest pain:  We felt that his chest pain was musculoskeletal in      nature and also secondary to cocaine use.  His troponins were      within normal limits.  Echocardiogram was within normal limits.      Please note that patient has antiphospholipid antibody syndrome      with history of DVTs before, and a CT angiogram was inconclusive.      They did a V/Q scan which showed low probability of a PE.  The      patient was supposed to be on Coumadin but intermittently does it.      We decided that despite an elevated d-dimer, this patient did not      have pulmonary embolism.  The chest pain was musculoskeletal in      nature.  5. Tachycardia:  This resolved with IV fluids.   Please note that a TSH      was low and the patient did have initial signs of hyperthyroidism      with tachycardia.  However, given that it resolved with fluids,      most likely this is sick euthyroid syndrome.  But his primary care      Danean Marner needs to follow up with TSH within two weeks to one month.  6. Constipation:  Patient was chronically constipated and was more so      in the hospital.  We tried MiraLax and p.o. Dulcolax.  However,      these did not work, so we tried Dulcolax suppositories which did      cause a bowel movement and relief.  7. Polysubstance abuse:  Patient was counseled to stop using cocaine.  8. Antiphospholipid antibody:  The patient was not restarted on      Coumadin as he is non compliant and there is no one to check his      INR.  When he does set up with a primary care physician, this      physician should consider restarting the Coumadin and checking his      INR regularly.   CONSULTATION:  GI.  However, they did not physically see the patient as  they were too busy and we decided we did not need them.   IMAGES:  This patient had a chest x-ray, a CT angiogram of the chest  which showed negative large pulmonary embolism.  However, artifact  cannot rule out smaller emboli.  Patient had a V/Q scan which showed low  probability for PE.  Patient had abdominal radiograph which was within  normal limits.  Patient had a 2D echo which showed an ejection fraction  of 50% to 65%, otherwise within normal limits.   PERTINENT LABORATORIES:  Please note troponins  remained within normal  limits throughout his hospital stay.  Initial CBC was within normal  limits.  Alcohol level less than 5.  Urine drug screen positive for  cocaine.  Lipase within normal limits at 18.  Initial creatinine was  1.1.  His gastric contents did show positive for blood most likely secondary to increased vomiting.  INR 1.1.  Initial comprehensive  metabolic panel was abnormal with a low  sodium of 134, potassium  elevated at 5.5, chloride low at 89, bicarb low at 17, and glucose  elevated at 552.  Patient's liver enzymes: AST elevated at 113, ALT 185.  Please note that these electrolytes corrected very quickly and by the  morning after admission, his electrolytes were normal with a sodium of  137, potassium of 3.8, chloride 102, bicarb 23, glucose 300, creatinine  1.29.  On day of discharge, patient's electrolytes were also within  normal limits with a sodium of 135, potassium 4.5, chloride 100, CO2 30,  glucose elevated at 315, creatinine 0.79.  CBC within normal limits that  day.  HIV nonreactive.  His magnesium and phosphorous were normal within  his hospital stay.  D-dimer elevated at 7.41.   DISCHARGE MEDICATIONS:  1. Lantus 25 units subcu nightly.  2. Humulin R sliding scale insulin with syringes of 100.  3. Dulcolax 50 mg 1 pill p.o. p.r.n. constipation.  4. Percocet 5/25, 1 tablet q.6h. p.r.n. pain not helped by ibuprofen.  5. Ibuprofen 600 mg 1 tablet q.6h. p.r.n. pain.   DISCHARGE FOLLOWUP:  The patient is still deciding on primary care  physician and was given a list.  He needs to return to this physician  within two weeks to one month for followup on his diabetes and followup  on his thyroid.  We stressed the importance of this appointment at that  time.  The patient was also instructed to not take Coumadin until his  next doctor's appointment.   FOLLOW UP:  For primary care physician, please check thyroid and check  glucose levels, and hemoglobin A1c.           ______________________________  Johney Maine, M.D.     JT/MEDQ  D:  10/30/2006  T:  10/31/2006  Job:  [id number]   cc:   Tobin A. Leveda Anna, M.D.

## 2011-01-17 NOTE — Discharge Summary (Signed)
NAME:  John Cox, PRUETT NO.:  [id number]   MEDICAL RECORD NO.:  [id number]          PATIENT TYPE:  INP   LOCATION:  5017                         FACILITY:  [facility]   PHYSICIAN:  Eliseo Gum, M.D.   DATE OF BIRTH:  August 19, 1971   DATE OF ADMISSION:  01/14/2006  DATE OF DISCHARGE:  01/16/2006                                 DISCHARGE SUMMARY   DISCHARGE DIAGNOSES:  1. Diabetic ketoacidosis.  2. Diabetes mellitus diagnosed in 1998.  3. 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  4. History of right deep vein thrombosis.  5. History of depression.  6. History of polysubstance abuse.   DISCHARGE MEDICATIONS:  1. Lovenox 65 mg subcutaneously q. 12 h.  2. Coumadin 5 mg p.o. nightly.  3. Lantus 25 units subcutaneously nightly.  The patient was advised to      increase dose by 2 to 5 units per day if fasting sugar is greater than      180.  4. NovoLog 4 units before each meal and NovoLog sliding scale insulin with      each meal.   The patient was advised to follow up with Dr. Alexandria Lodge for Coumadin adjustment  and with Dr. Alvester Morin on the date of May 21 at 2 p.m. at [facility].   HISTORY OF PRESENT ILLNESS:  [name] is a 40 year old African-American man  with past medical history significant for diabetes mellitus complicated by  multiple DKA due to medical noncompliance.  This admission, he has had his  tenth episode of DKA since May 2006.  He also has history of bilateral  pulmonary embolism secondary to antiphospholipid antibody syndrome on  chronic anticoagulation therapy and history of polysubstance abuse and  depression.  He was recently discharged from the hospital December 15, 2005.  The patient presented to the emergency room at [facility] on date  of Jan 13, 2006, with complaint of nausea, vomiting, and weakness.  He was  found to have  elevated sugar at 600, BUN 34, creatinine 2.1.  His diagnosis  at that time was DKA with anion gap of 20.  He was rehydrated with IV fluids  and started on Glucomander.  He was transferred to [facility] where  he was accepted by our team, attending being Dr. Okey Dupre.   PHYSICAL EXAMINATION ON ADMISSION:  VITAL SIGNS: Temperature 98.8, heart  rate 88, blood pressure 124/54, respirations 16, O2 saturation 100% on room  air.  GENERAL: No acute distress.  HEENT: Normocephalic and atraumatic.  Mucous membranes dry.  NECK:  No carotid bruits.  HEART: Regular rate and rhythm.  LUNGS: Clear to auscultation.  ABDOMEN:  Soft, nontender, nondistended.  Positive bowel sounds.  EXTREMITIES:  No clubbing, cyanosis, or edema.  Good pulses.  NEUROLOGIC: Nonfocal  PSYCHOLOGIC:  Oriented x3.   At admission, Jan 13, 2006, he had sodium of 138, potassium 5.8, chloride  91, bicarbonate 14, albumin 44, creatinine 2.1, and glucose 673.  When he  was  seen by our team, he had sodium 145, potassium 4.3, chloride 103,  bicarbonate 26, BUN 34, creatinine 1.8, and glucose 269 with corrected anion  gap of 6.   HOSPITAL COURSE:  #1.  DIABETIC KETOACIDOSIS:  Most likely secondary to medication  noncompliance and possibly pancreatitis.  The patient has a lipase elevated  to 61 on May 15.  The patient was placed on n.p.o., rehydrated  appropriately.  Gap was actually closed when he presented to [facility].  He was discharged on the date of 18 May, stable, with resolved  DKA.   #2.  BILATERAL PULMONARY EMBOLI:  The patient was started on Lovenox and  Coumadin.  INR was subtherapeutic.  He was discharged home with appointment  with Dr. Alexandria Lodge to follow INRs and further adjustments of his Warfarin.   #3.  ACUTE RENAL FAILURE:  Considered secondary to dehydration secondary to  DKA, resolved at discharge.   #4.  LEUKOCYTOSIS:  Most likely secondary to DKA.   #5.  MEDICATION NONCOMPLIANCE:  The  patient was advised to follow up at  outpatient clinic with social worker and Radiation protection practitioner.   #6.  POLYSUBSTANCE ABUSE: The patient received counseling in the hospital  and AA numbers to follow up as outpatient.   CONDITION ON DISCHARGE:  The patient was stable.  DKA resolved. Acute renal  failure significantly improved with creatinine at baseline.   LABORATORY DATA:  Sodium 135, potassium 4.1, chloride 100, bicarbonate 26,  BUN 17, creatinine 1.0.   Vital signs showed blood pressure of 131/86, heart rate 70, respiratory rate  20, saturation 97-98% on room air.  Hemoglobin A1c at this admission was  12.1.      Vanetta Mulders, MD  Electronically Signed     ______________________________  Eliseo Gum, M.D.    DA/MEDQ  D:  03/25/2006  T:  03/25/2006  Job:  [id number]

## 2011-01-17 NOTE — Discharge Summary (Signed)
NAME:  LOVELACE, CERVENY NO.:  [id number]   MEDICAL RECORD NO.:  [id number]          PATIENT TYPE:  INP   LOCATION:  6733                         FACILITY:  [facility]   PHYSICIAN:  Fleet Contras, M.D.    DATE OF BIRTH:  12-16-1970   DATE OF ADMISSION:  01/08/2005  DATE OF DISCHARGE:  01/10/2005                                 DISCHARGE SUMMARY   ADMISSION DIAGNOSES:  1.  Type 2 diabetes mellitus, uncontrolled.  2.  Dehydration.  3.  Dental caries.   DISCHARGE DIAGNOSES:  1.  Type 2 diabetes mellitus, controlled.  2.  Dehydration.  3.  Dental caries.   PRESENTATION:  Mr. Lalani is a 40 year old African American gentleman who  presented to the emergency room at [facility] with one day of  nausea and vomiting. In the emergency room he was found to have a CPK of  over 400. He admitted to using marijuana and had not been using his  medications as prescribed. He also had a right lower toothache, but no  fevers or chills. In the emergency room he was noted to be dehydrated. His  initial CBG was over 600. His pH was 7.3 with a bicarbonate of 19. He was  therefore admitted for treatment with intravenous fluids, intravenous  insulin therapy.   HOSPITAL COURSE:  On admission the patient was started on intravenous  insulin, glucomander. CBGs, input and output, and electrolytes were  monitored closely. He also received intravenous fluids and Vicodin was given  for toothache. Urine drug screen did reveal that he had been using cocaine  as well as marijuana. His symptoms improved significantly during  hospitalization. He was to come off the glucomander within 24 hours. He had  an episode hypoglycemia on his regular home regimen of insulin therapy. He  was able to tolerate oral intake and he will restart his insulin regimen at  home. On Jan 10, 2005, he was feeling much better, eating and drinking  regular without nausea and vomiting. Vital signs were stable and he was  able  to considered stable for discharge home. His discharge condition was stable.   DISPOSITION:  Home.   DISCHARGE MEDICATIONS:  1.  Amaryl 4 mg once a day.  2.  Lantus insulin 50 units q.h.s.  3.  Protonix 40 mg once a day.  4.  Vicodin 5/500 one to two q.6h. p.r.n.   He is to follow up with Dr. Fleet Contras in one week.      Fleet Contras, M.D.  Electronically Signed     EA/MEDQ  D:  03/12/2005  T:  03/13/2005  Job:  [id number]

## 2011-01-17 NOTE — Discharge Summary (Signed)
NAME:  John Cox, John Cox NO.:  [id number]   MEDICAL RECORD NO.:  [id number]          PATIENT TYPE:  INP   LOCATION:  5740                         FACILITY:  [facility]   PHYSICIAN:  Duncan Dull, M.D.     DATE OF BIRTH:  10/20/1970   DATE OF ADMISSION:  06/10/2006  DATE OF DISCHARGE:  06/18/2006                                 DISCHARGE SUMMARY   DISCHARGE DIAGNOSES:  1. Diabetic ketoacidosis.  2. Polysubstance abuse.  3. Acute renal failure secondary to diabetic ketoacidosis.  4. History of pulmonary embolism secondary to antiphospholipid antibody      syndrome, on chronic Coumadin.  Diagnosed in December, 2006.      Noncompliant.  5. History of right deep venous thrombosis.  6. History of depression.   DISCHARGE MEDICATIONS:  1. Novolin 70/30 30 units q.a.m. and 25 units q.p.m.  2. Coumadin 10 mg p.o. daily for three days, then 7.5 mg p.o. daily for 3      days, then to follow up at [facility] outpatient Coumadin clinic.   FOLLOW UP:  1. Patient is to follow up in the [facility] outpatient clinic with Dr.      Frederico Hamman on June 24, 2006 at 9:45 a.m. to check if patient is taking      his medication and for Coumadin followup.  2. ADS [phone] and [id number] was given for his polysubstance abuse.   PRIMARY CARE PHYSICIAN:  Patient has never followed up in the [facility]  outpatient clinic, only seen during hospitalization for DKA.   CONSULTANTS THIS ADMISSION:  None.   PROCEDURES DONE THIS ADMISSION:  None.   IMAGES DONE THIS ADMISSION:  1. Chest x-ray done on June 10, 2006 showing no acute finding.  2. CT angio chest done on June 12, 2006 showing subtle      pneumomediastinum, negative for PE.  Minimal diffuse, hazy ground glass      opacification of the lungs.  Suggests alveolitis.  Seen in multiple      conditions, including smokers.  Mild biaxillary adenopathy.   BRIEF HISTORY AND PHYSICAL:  Please see medical records for full details.  Mr.  John Cox is a 40 year old African-American male with a past medical history  significant for type 2 diabetes mellitus, uncontrolled due to medical  noncompliance, status post multiple diabetic ketoacidosis admissions,  antiphospholipid antibody syndrome, status post pulmonary embolus in  December, 2006, and polysubstance abuse (cocaine, alcohol, and tobacco),  presenting to the [facility] emergency department complaining of nausea,  vomiting, chest pain, and general malaise.  Patient apparently had stopped  taking his insulin several weeks before.  Patient denies hematemesis or  coffee-ground emesis.  Patient does complain of chest pain as well,  described as retrosternal, pressure-like stabbing without radiation,  intermittent, lasting for around 2-3 minutes, and has had six episodes prior  to admission associated with shortness of breath and diaphoresis.  Patient  suspected that he was in DKA prior to admission; hence, took 15 units of NPH  before coming to the emergency department.  Patient said his last use of  marijuana, cocaine, and alcohol was on the day of admission.   ALLERGIES:  No known drug allergies.   PAST MEDICAL HISTORY:  1. Type 1 diabetes mellitus diagnosed at age 66.  2. Status post DKA in October, 2007, last hospitalization, and this has      been the 10th episode since May, 2006.  3. Bilateral pulmonary embolism in December, 2006 secondary to      antiphospholipid antibody syndrome, noncompliant with warfarin.  4. History of DVT in December, 2006.  5. Polysubstance abuse.  6. History of depression.   MEDICATION LIST:  Patient takes Lantus or NPH sporadically.   SUBSTANCE HISTORY:  Patient is a current smoker, smoking one pack per day  for the last 20 years.  Positive alcohol and cocaine history.   PHYSICAL EXAMINATION:  VITAL SIGNS:  Temperature 98, blood pressure 141/75,  pulse 92, respiratory rate 22, O2 sats 97% on room air.  GENERAL APPEARANCE:  Patient speaks  in complete sentences but kept his eyes  closed throughout interaction.  In moderate distress.  HEENT:  Pupils are equal, round and reactive to light.  Extraocular  movements are intact.  Anicteric.  ENT/oropharynx:  Erythema present in the  posterior pharynx.  No lesions.  No exudates.  NECK:  Supple.  No adenopathy.  LUNGS:  Clear to auscultation bilaterally.  No wheeze or rhonchi.  HEART:  Regular rate and rhythm.  No murmurs, rubs or gallops.  ABDOMEN:  Soft, nondistended.  Diffuse tenderness to palpation.  No rebound  or guarding.  Bowel sounds present.  EXTREMITIES:  No clubbing, cyanosis or edema.  NEUROLOGIC:  Alert and oriented x4.  Cranial nerves II-XII grossly intact.  No focal deficits.  Strength 5/5 bilaterally.  PSYCH:  Appropriate.   LAB TESTS ON ADMISSION:  Sodium 138, potassium 4.5, chloride 100, bicarb 17,  BUN 19, creatinine 1.7, glucose 241.  Anion gap of 19.  Bilirubin of 2.3, of  which 2.1 was indirect and 0.2 was direct.  AST 30, ALT 38, total protein 8,  albumin 4.7, calcium 9.7.  hemoglobin 14.9, MCV 84, white cells 9.9,  platelets 421, ANC 7.2.  Alcohol level less than 5.  __________ 1.  UA:  Nitrites negative.  Glucose more than 1000.  Microscopy:  0-2 white cells, 0-  2 RBCs.  ABG showing pH of 7 and pCO2 of 40, pO2 82, bicarb 20.  Blood  acetones small.  UDS positive for cocaine.   HOSPITAL COURSE:  1. Diabetic ketoacidosis:  Patient was admitted to the ICU for management      with aggressive IV hydration, iInsulin drip and  electrolyte      replacement.  Once the acidosis had resolved, he was transitioned to SQ      Lantus.  Since patient had issues of noncompliance, insulin regimen was      simplified to 70/30 Novolin for reduction in the number of daily      injections needed.  Patient remained in house until his CBG's were      under 200 and was discharged on 30 units Novolin q.a.m. and 25 units     q.p.m.  with written instructions on how to to  titrate his doses of      insulin.  Patient is to follow up in the [facility] outpatient clinic      for a hospital followup.  At that time, he will be referred to our      diabetic educator, and insulin strips,  etc., will be provided.   1. Chest pain, most likely secondary to coronary vasospasm secondary to      cocaine abuse.  Patient ruled out for an acute myocardial infarction      with serial cardiac enzymes.  CT angio of the chest was done which      ruled out pulmonary embolus but showed pneumomediastinum, which is      commonly seen in cocaine abusers; however, this resolved during      hospitalization.   1. Acute renal failure secondary to volume depletion secondary to DKA:      Patient was rehydrated aggressively for management of DKA, and his      creatinine on the day of discharge was 0.9.   1. Polysubstance abuse:  Patient was counseled regarding cessation of      cocaine, alcohol, and tobacco use.  Patient was given numbers for ADS      and [facility].   1. History of pulmonary embolism and DVT in December, 2006 secondary to      antiphospholipid syndrome.  Patient's risks of recurrent VTE and CVA      without chronic anticoagulation vs intracerebral hemorrhage if resuming      antiocoagulation in the setting of ongoing cocaine use were discussed      at length with patient.  At time of discharge, he appeared to have made      several plans to seek outpatient rehab for cocaine abuse.  He      understood the risk of bleeding if he resumed cocaine use and      therefore, coumadin was restarted during hospitalization.  Patient was      asked to follow up in the [facility] Coumadin clinic on the day of      discharge.  His PT was 13.8 and INR 1.   DISCHARGE VITALS:  Temperature 98.3, blood pressure 113/74, respirations 24,  pulse 59, O2 sats 98% on room air.   DISCHARGE LABS:  Sodium 137, potassium 4.2, chloride 98, bicarb 32, glucose  311, BUN  15, creatinine 0.9, calcium 9.4.  Hemoglobin 13.4, hematocrit 40,  MCV 82.1, white cells 4.1, platelets 360, PT 13.8, INR 1.      Yetta Barre, M.D.  Electronically Signed      Duncan Dull, M.D.  Electronically Signed    SS/MEDQ  D:  06/25/2006  T:  06/25/2006  Job:  [id number]

## 2011-01-17 NOTE — Discharge Summary (Signed)
NAME:  John Cox, John Cox NO.:  [id number]   MEDICAL RECORD NO.:  [id number]          PATIENT TYPE:  INP   LOCATION:  4706                         FACILITY:  [facility]   PHYSICIAN:  Fleet Contras, M.D.    DATE OF BIRTH:  1971/01/23   DATE OF ADMISSION:  07/14/2004  DATE OF DISCHARGE:  07/17/2004                                 DISCHARGE SUMMARY   PRESENTING COMPLAINTS:  Vomiting.   HISTORY OF PRESENTING COMPLAINTS:  Mr. Lancour is a 40 year old African-  American gentleman with past medical history significant for type 2 diabetes  mellitus.  He has been very noncompliant with his medical therapy.  Had  follow-up in the office.  Presented to the emergency room at [facility] with one day history of vomiting.  He stated he had been taking his  insulin, but was unable to keep his food down.  On the day of admission  evaluation in the emergency room he was noted to be slightly dehydrated.  Blood pressure was 112/65, heart rate 93, respiratory rate 20, temperature  97 degrees, oxygen saturation 98% on room air.  Initial laboratory data  showed a glucose of 408.  Was therefore admitted for uncontrolled type 2  diabetes mellitus.   ADMITTING DIAGNOSES:  1.  Type 2 diabetes mellitus poorly controlled.  2.  Dehydration.   HOSPITAL COURSE:  On admission patient was started on Lantus insulin as well  as sliding scale NovoLog insulin.  He was also on IV fluids with half normal  saline with 30 mEq of potassium at 125 mL/hour.  CBGs were monitored q.a.c.  and h.s.  Sliding scale Regular insulin was given per moderately sensitive  protocol.  Urine input and output was also monitored.  His condition  improved within 48 hours.  He even had one episode of hypoglycemia during  hospitalization.  Was seen by diabetes treatment team who recommended  outpatient follow-up for diabetic teaching and nutrition review.  On  July 17, 2004 was feeling much better.  CBG was 200.  Vital  signs were  stable.  __________ improved.  His IV fluids were discontinued.  Was  considered stable for discharge home.   DISCHARGE MEDICATIONS:  1.  Lantus insulin 40 units subcutaneous q.h.s.  2.  NovoLog insulin sliding scale q.a.c. and h.s.  3.  Amaryl 4 mg once a day.   Continue 1800 kilocalorie ADA diet.  The need for compliance with prescribed  therapy was strongly encouraged.  Was to follow up at the diabetic teaching  classes and also with Dr. Fleet Contras one week.       EA/MEDQ  D:  09/03/2004  T:  09/03/2004  Job:  [id number]
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
## 2011-01-17 NOTE — Discharge Summary (Signed)
NAME:  AZAD, CALAME NO.:  [id number]   MEDICAL RECORD NO.:  [id number]                   PATIENT TYPE:  INP   LOCATION:  4712                                 FACILITY:  [facility]   PHYSICIAN:  Madeleine B. Erenest Rasher, M.D.         DATE OF BIRTH:  March 02, 1971   DATE OF ADMISSION:  04/06/2004  DATE OF DISCHARGE:  04/09/2004                                 DISCHARGE SUMMARY   DISCHARGE DIAGNOSES:  Diabetes type 1.   DISCHARGE MEDICATIONS:  Lantus 50 units subcutaneous q.h.s.   HOSPITAL COURSE:  #1 - DIABETES:  This is a 40 year old male with diabetes  who is noncompliant and had several admissions for DKA.  Admitted this time  for DKA.  The patient was placed on insulin drip.  Drip was discharged after  blood sugars returned to normal.  Was given diabetic education and  counseling.  CBGs were checked.  Patient's sugars returned to normal and  acidosis resolved.  He received diabetic education.  Patient counseled on  dangers of DKA and necessities of checking sugars and controlling sugars  adequately.  HBA1C was checked and pending at discharge.   #2 - TOBACCO:  Smoking cessation education consult ordered.  Patient not  ready to quit.   DISPOSITION:  Discharged to home.  Discharged with follow-up for sugar  checks and diabetes education.   CONDITION ON DISCHARGE:  Stable.                                                Tawnya Crook Erenest Rasher, M.D.    MBV/MEDQ  D:  05/09/2004  T:  05/10/2004  Job:  [id number]

## 2011-01-17 NOTE — H&P (Signed)
NAME:  John Cox, John Cox NO.:  [id number]   MEDICAL RECORD NO.:  [id number]          PATIENT TYPE:  INP   LOCATION:  NA                           FACILITY:  [facility]   PHYSICIAN:  Hollice Espy, M.D.DATE OF BIRTH:  03/08/1971   DATE OF ADMISSION:  01/13/2006  DATE OF DISCHARGE:                                HISTORY & PHYSICAL   PRIMARY CARE PHYSICIAN:  [facility].   CHIEF COMPLAINT:  Weakness.   HISTORY OF PRESENT ILLNESS:  The patient is a 40 year old African-American  male with past medical history of diabetes mellitus type 1 with multiple DKA  secondary to medical noncompliance.  In fact, this is his 10th episode of  DKA since May of 2006.  The patient also has a history of bilateral  pulmonary emboli in December of 2006 secondary to antiphospholipid antibody  syndrome on chronic anticoagulation therapy.  He also has a history of  polysubstance abuse and depression.   The patient was discharged from the hospital for December 15, 2005.  Since that  time, he has been doing okay but apparently for the last few days has been  complaining of nausea, vomiting and weakness.  Presented to the emergency  room and was found to have an elevated blood sugar in the 600s.  Other labs  of note and concern were a bicarb level of 14 and BUN of 34, creatinine of  2.1.  The patient was found to be in DKA with anion gap of 20.  He was given  a dose of liter of IV fluids and started on insulin Glucomander.  His sugars  since then have come down to the 400s.  The patient currently is quite  lethargic secondary to a dose of Phenergan he received in the emergency  room.  He has no complaints and was starting to feel a little bit better but  he is unable to give me any further review of systems.   PAST MEDICAL HISTORY:  1.  Diabetes mellitus type 1 diagnosed in 1998.  2.  Multiple episodes of DKA secondary to medical noncompliance.  This is      episode 10 in the  past 12 months.  3.  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  This was first diagnosed in December of 2006.  4.  History of right DVT.  5.  History of depression.  6.  History of polysubstance abuse.   MEDICATIONS:  (Based on discharge summary from one month ago.)  1.  Lantus 15 units subcutaneously q.h.s.  2.  Coumadin 5 p.o. q.h.s.   ALLERGIES:  The patient has no known drug allergies.   SOCIAL HISTORY:  He is a history of tobacco, alcohol and marijuana use as  well as cocaine.   FAMILY HISTORY:  Noncontributory.   PHYSICAL EXAMINATION:  VITAL SIGNS:  Temperature 98.8, heart rate 88, blood  pressure 124/64, respirations 16.  O2 saturation 100% on room air.  GENERAL:  The patient is quite sleepy but able to be awakened and nods head  to questions appropriately.  HEENT:  Normocephalic and atraumatic.  Mucus membranes are dry.  He has no  carotid bruits.  HEART:  Regular rate and rhythm.  S1 and S2.  Clear to auscultation  bilaterally.  ABDOMEN:  Soft, nontender, and nondistended.  Positive bowel sounds.  EXTREMITIES:  No clubbing, cyanosis, or edema.   LABORATORY DATA:  Sodium 135, potassium 5.8, chloride 91, bicarb 14, BUN 34,  creatinine 2.1, glucose 673, calcium 10.  His anion gap is elevated at 30.  CBC not done.  UA shows greater than 80 of ketones, greater than 1000 of  glucose.  UA is otherwise unremarkable.   ASSESSMENT/PLAN:  1.  Diabetic ketoacidosis:  Likely secondary to noncompliance but we will      check other issues including a complete blood count with differential.      In the meantime, we will continue diabetic ketoacidosis protocol      including intravenous fluids and insulin Glucomander.  Once his anion      gap has resolved and his sugars are below 200, we will then switch him      over to his basal insulin.  2.  Recent diagnosis of bilateral pulmonary emboli:  Check his PT and INR.      I suspect it  may be subtherapeutic and if so we will resume him on his      Coumadin.  3.  Acute renal failure:  This is brought on secondary to hyperglycemia.  We      will continue intravenous fluids and this should improve.  4.  Hyperkalemia:  This is falsely elevated secondary to his elevated blood      sugars.  This will improve with his insulin and hyperglycemia.  5.  History of antiphospholipid antibody syndrome.      Hollice Espy, M.D.  Electronically Signed     SKK/MEDQ  D:  01/14/2006  T:  01/14/2006  Job:  [id number]   cc:   Johny Shock, M.D.  [facility]

## 2011-01-17 NOTE — Discharge Summary (Signed)
NAME:  John Cox, LOR NO.:  [id number]   MEDICAL RECORD NO.:  [id number]                   PATIENT TYPE:  INP   LOCATION:  3312                                 FACILITY:  [facility]   PHYSICIAN:  Madaline Guthrie, M.D.                 DATE OF BIRTH:  1971/06/09   DATE OF ADMISSION:  10/17/2002  DATE OF DISCHARGE:  10/18/2002                                 DISCHARGE SUMMARY   DISCHARGE DIAGNOSES:  1. Insulin dependent diabetes, status post diabetic ketoacidosis.  2. Polysubstance abuse.  3. Chronic renal insufficiency.   DISCHARGE MEDICATIONS:  Lantus 45 units subcutaneously q.h.s.   DISPOSITION:  The patient was discharged home in stable condition.  He was  given an appointment to follow up at the ______ [facility] on Thursday,  February 26 at 11:15 in the morning.  He is encouraged to keep that  appointment.  As an outpatient it is recommended that sliding scale insulin  be started.   ADMISSION HISTORY AND PHYSICAL EXAMINATION:  For full details see admission  history and physical please see the chart.  In brief, Mr.  Grinder is a 40-  year-old [place] American male with insulin dependent adult onset diabetes  who for the past week has had blood sugars between 400 and 500.  On February  13 he had a biscuit and gravy at [employer] and then had an onset of  diffuse generalized abdominal pain.  He was able to take solid foods on  October 15, 2002 and later on October 16, 2002 was unable to take liquids  by mouth.  He had no vomiting or diarrhea.  He had no cough, no fevers or  chills, no dysuria or hematuria.  He has not taken Lantus since February 13.  Of note, he was diagnosed with diabetes during a hospitalization in 2002 and  was discharged at that time on oral hypoglycemic medications which he took  intermittently for one year.  At that time he had been changed to insulin.   PHYSICAL EXAMINATION:  VITALS:  Temperature 96.8, pulse 93, respirations  24,  blood pressure 134/76, oxygen saturation 100% on room air.  GENERAL:  [name] was fatigued but alert and oriented.  HEENT:  Pupils are equally round and reactive to light and accommodation.  Mucous membranes were moist, with no erythema or thrush.  He had a  laceration on the lower mid lip.  NECK:  Supple, without thyromegaly or lymphadenopathy.  LUNGS:  Clear to auscultation bilaterally with good air movement.  HEART:  Regular rate and rhythm with no murmur, gallop or rubs.  ABDOMEN:  Flat with positive bowel sounds.  It was nontender, there was no  rebound or guarding.  EXTREMITIES:  Warm with no edema.  NEUROLOGIC:  Nonfocal.   LABORATORY DATA:  Laboratory in the ED initially showed a basic metabolic  profile with a sodium of 131, potassium  4.3, chloride 98, CO2 13, BUN 18,  creatinine 1.6, glucose 322 with an anion gap of 18.  CBC showed a white  blood cell count of 6.7, hemoglobin 16.2, platelets 386 with an MCV of 85.8.  There was a small amount of serum acetone present.   [name] was admitted to the medical teaching service for evaluation and  treatment of his hyperglycemia with acidosis.   HOSPITAL COURSE:  Problem 1.  DIABETES/DKA.  [name] was treated for  diabetic ketoacidosis.  He was started on insulin drip and was aggressively  rehydrated with IV fluids.  His bicarbonate quickly improved as well as his  anion gap and he was switched from the insulin drip to NPH insulin with a  sliding scale and his IV fluids were switched to normal saline.  His  potassium was repleted as necessary.  He is being discharged on his home  dose of Lantus and he is encouraged [facility] at which  time possibly starting a sliding scale should be discussed with Dr. Emeline Darling.  Of note, diabetes education was obtained while Mr. Wiemers was in the hospital  and the diabetic educator agreed with this plan.   Problem 2.  ABDOMINAL PAIN.  [name] had no further complaints  of  abdominal pain after admission.  This was thought to be secondary to either  his DKA or cocaine use however, he states that he last used cocaine  approximately one week before.   Problem 3.  RENAL INSUFFICIENCY.  [name]'s BUN and creatinine decreased  with aggressive fluid rehydration.   Problem 4.  POLYSUBSTANCE ABUSE.  [name] requests alcohol and drug rehab  services and a consult was obtained.   Problem 5.  ANEMIA.  [name] had slight anemia the day of discharge that  was thought to be secondary to hemodilution.  Of note, he is hemodynamically  stable.   Problem 6.  MISCELLANEOUS.  Mr.  Mcauliffe requested STD testing the day of  discharge including HIV, syphilis, gonorrhea and Chlamydia.  These are all  pending at the time of discharge, will be followed up on.   DISCHARGE LABORATORY:  CBC the day of discharge showed a white blood cell  count of 5.1, hemoglobin 12.4, hematocrit 36.3, platelets 317,000.  A basic  metabolic profile the afternoon of discharge shows a sodium of 140,  potassium 3.9, chloride 104, CO2 33, BUN 7, creatinine 1.0, glucose 243 and  a calcium of 9.0.     Michel Harrow, M.D.                        Madaline Guthrie, M.D.    KB/MEDQ  D:  10/18/2002  T:  10/18/2002  Job:  [id number]   cc:   Duke Salvia, M.D.

## 2011-01-17 NOTE — Discharge Summary (Signed)
NAME:  John Cox, John Cox NO.:  [id number]   MEDICAL RECORD NO.:  [id number]          PATIENT TYPE:  INP   LOCATION:  5522                         FACILITY:  [facility]   PHYSICIAN:  Isidor Holts, M.D.  DATE OF BIRTH:  [birth year]   DATE OF ADMISSION:  09/30/2006  DATE OF DISCHARGE:  10/06/2006                               DISCHARGE SUMMARY   ADDENDUM:  For discharge diagnoses, procedures, admission history and  detailed clinical course, refer to interim summary dated October 04, 2006 by Dr. Isidor Holts.  In addition, for the period from October 05, 2006 to October 06, 2006, patient's clinical condition remained  stable.  The main problem was control of his glycemia and optimization  of insulin regimen.  The patient, at presentation, had been on a  combination of hypoglycemic medication and insulin (70/30) on a twice  daily basis.  It was felt that, because of compliance issues, it was  more appropriate to place patient on once daily scheduled Lantus.  He  had a number of episodes of hypoglycemia, while dosage was being  titrated.  However, as of a.m. October 06, 2006, he had become  euglycemic on 30 units of Lantus subcutaneously q.h.s.  Since patient  clearly has type 1 diabetes mellitus, no purpose will be served by  continuing him on oral hypoglycemic agents.  These have been  discontinued.  He is being discharged on a combination of Lantus  insulin, carbohydrate-modified diet and sliding scale insulin coverage.  As there were no further acute issues, patient has been discharged  accordingly on October 06, 2006.  He has been instructed to follow up  with [facility] within one week of discharge.  All of this has been  communicated to him.  He has verbalized understanding.   DISCHARGE MEDICATIONS:  1. Lantus Insulin 30 units s.c q.h.s.  2. Humulin-R per sliding scale.   Note: Metformin has been discontinued.      Isidor Holts, M.D.  Electronically  Signed     CO/MEDQ  D:  10/06/2006  T:  10/06/2006  Job:  [id number]

## 2011-01-17 NOTE — Discharge Summary (Signed)
NAME:  John Cox, John Cox NO.:  [id number]   MEDICAL RECORD NO.:  [id number]          PATIENT TYPE:  EMS   LOCATION:  ED                           FACILITY:  [facility]   PHYSICIAN:  Isidor Holts, M.D.  DATE OF BIRTH:  1971/04/17   DATE OF ADMISSION:  09/29/2006  DATE OF DISCHARGE:  10/05/2006                               DISCHARGE SUMMARY   PRIMARY CARE PHYSICIAN:  Unassigned.   DISCHARGE DIAGNOSES:  1. Type 1 diabetes mellitus.  2. Diabetic ketoacidosis.  3. History of antiphospholipid antibody syndrome.  4. Status post previous deep venous thrombosis/pulmonary embolus.  5. Polysubstance abuse with marijuana, cocaine, alcohol and tobacco.   DISCHARGE MEDICATIONS:  1. Metformin 500 mg p.o. daily.  2. Lantus insulin 45 units SQ q.h.s.   PROCEDURE:  1. Head CT scan dated September 29, 2006 showed no evidence of acute      intracranial abnormality.  2. Chest x-ray dated September 30, 2006 showed no evidence of acute      cardiopulmonary disease.   CONSULTATIONS:  None.   ADMISSION HISTORY:  As of H&P note of Sep 30, 2006 dictated by Dr.  Elliot Cousin. In brief, this is a 40 year old male, with known history  of type 1 diabetes mellitus, status post recurrent admissions for DKA.  Last hospitalization was in October, 2007 for same, also known history  of PE/DVT secondary to antiphospholipid antibody syndrome. Was supposed  to be on Coumadin therapy, however has been noncompliant. Also  polysubstance abuse, including cocaine, ETOH, marijuana, and tobacco.  Presented accompanied by a police officer, with nonspecific complaints  of feeling generally unwell, and appeared to have altered mental  status.  Initial laboratory findings showed blood glucose of 582 with  bicarbonate of 15. It appears patient had not utilized his insulin in  the 2 days prior to presentation, had no vomiting or diarrhea. He was  admitted for further evaluation and investigation and  management.   <CLINICAL COURSE/>   PROBLEM #1: Diabetic ketoacidosis.  For details of presentation, refer  to admission history above. The patient was managed with aggressive  intravenous fluid hydration and placed on an insulin drip intravenously  by Glucomander protocol. We were subsequently able to switch him to  scheduled Lantus insulin as well as sliding scale insulin coverage and a  carbohydrate modified diet, following closure of his anion gap. By  October 02, 2006 the patient's hydration status was good, and  intravenous fluids were discontinued.   PROBLEM #2: Type 1 diabetes mellitus. Certainly he is not the most  compliant patient. However at the time of presentation he had reportedly  been on insulin (70/30). It was felt that patient would benefit from  simplification of his insulin regimen. He was therefore placed on single  dose Lantus insulin coverage. Over the course of his hospitalization,  his diabetes proved somewhat brittle, requiring titration of Lantus.  Unfortunately, on October 03, 2006 p.m. he was noted to have a p.m.  blood glucose of 400 and although Lantus insulin had been increased from  40 q.h.s. to 50 SQ q.h.s. on the  same day, the night cover inadvertently  increased patient's insulin dosage to 60 units Lantus insulin, in  response to above CBG numbers. The patient suffered hypoglycemic  episodes in the early a.m. of October 04, 2006. This was addressed with  appropriate glucose supplementation and insulin was held. Fortunately,  patient suffered no deleterious effects from this, and was fully alert  and oriented during the ward round of October 04, 2006. His Lantus  insulin dose has therefore been adjusted downwards, and it is  anticipated that this will prove adequate, provided he has adheres to  his diet.   PROBLEM #3: Substance abuse. The patient has been counseled  appropriately. During the course of this hospitalization, he was managed  with  Nicoderm CQ patch. He showed no evidence of alcohol withdrawal  phenomena, during the course of his hospitalization.   PROBLEM #4: History of venous thromboembolic disease/antiphospholipid  antibody syndrome. The patient in the past was placed on chronic  Coumadin treatment. However he has been notoriously noncompliant with  this. Against the background of his continued substance abuse and  continued medication noncompliance, it is deemed to be an exercise in  futility to continue anticoagulation. This has therefore been  discontinued.   PROBLEM #5: History of depression. The patient's mood remained stable,  throughout the course of his hospitalization.   DISPOSITION:  Provided no acute events occur over night, it is  anticipated that patient will be discharged on October 05, 2006 or soon  after, as he is now clinically stable.   FOLLOW UP:  Is going to be an important issue as patient has failed  follow-up in the past. He will once again be referred to [facility] to  establish a primary M.D. and should this fail, efforts are being made to  establish follow-up for patient at the [facility] outpatient clinic.  Clearly clinical social worker input would be helpful in this regard.  The patient is recommend to adhere to a carbohydrate modified diet.      Isidor Holts, M.D.  Electronically Signed     CO/MEDQ  D:  10/04/2006  T:  10/04/2006  Job:  [id number]

## 2011-01-17 NOTE — Discharge Summary (Signed)
[facility]  Patient:    John Cox, John Cox                      MRN: [id number] Adm. Date:  [id number] Disc. Date: [id number] Attending:  Anastasio Auerbach CC:         [facility]   Discharge Summary  DATE OF BIRTH:  [birth year]  DISCHARGE DIAGNOSES: 1. Diabetic ketoacidosis.    a. Secondary to noncompliance.    b. Admission gap 25, discharge gap 5. 2. Right dental cavity with questionable abscess; no fever or    leukocytosis. 3. Diabetes mellitus, type 1.    a. Diagnosed a year and a half ago.    b. Initially on oral medications. 4. Abdominal pain and vomiting.    a. Abdominal computed tomographic scan negative.    b. Secondary to diabetic ketoacidosis.    c. Pancreatic enzymes normal. 5. Dehydration secondary to above.  Admission BUN 25, creatinine 1.7    (discharge 13 and 1.1). 6. Mildly elevated liver function test, secondary to acute illness and    alcohol use. 7. Alcohol use, three to four days per week. 8. Cocaine use.    a. Last use four days prior to admission.    b. History of intravenous drug use.  DISCHARGE MEDICATIONS: 1. Clindamycin 150 mg p.o. t.i.d. x10 days. 2. Vicodin 5/500 1-2 q.4h. p.r.n. severe pain (maximum of 6 per day) -    dispense #20, no refills. 3. Insulin 70/30 20 units in the morning with breakfast and 15 units in the    evening with supper. 4. Pepcid 10 mg p.o. b.i.d. x5 days then stop (can buy over-the-counter).  CONDITION ON DISCHARGE:  Stable.  The patient is tolerating a regular ADA diet.  His sugars are still mildly elevated but we are titrating up his insulin.  He remains afebrile.  DISPOSITION:  Home.  RECOMMENDED ACTIVITY:  As tolerated.  RECOMMENDED DIET:  A 2000-calorie ADA diet.  Drink plenty of water.  SPECIAL INSTRUCTIONS: 1. Return or call if develop fevers, severe diarrhea or vomiting. 2. Check your blood sugar in the morning before breakfast and in the    evening before supper.  Bring a  copy of your sugars to the doctor.    Call [facility] physician if sugars less than 70 or greater than 300.  FOLLOWUP: 1. The patient is to contact [facility] on Monday to schedule a followup    appointment as soon as possible next week. 2. The patient is to visit the dentist of choice early this week.  I gave him    instructions to tell the dentist exactly what the x-rays showed and also to    quote prices.  CONSULTATIONS:  None.  PROCEDURES: 1. Orthopantogram:  Large dental cavity, second right upper molar, with    questionable associated right abscess associated with the tooth root    which is also in the region of the right maxillary sinus which makes    definitive diagnoses impossible.  The other teeth appear to be without    acute abnormality. 2. Abdominal pelvic CT:  August 2002:  No acute abnormalities.  Liver,    pancreas, spleen, and adrenal glands unremarkable.  Appendix is normal.    No mass or adenopathy.  No free fluid.  HOSPITAL COURSE: #1 - DIABETIC KETOACIDOSIS:  The patient is a 40 year old, African-American gentleman, who was diagnosed with diabetes a year and a half ago.  He was initially put  on oral medication but since following up with [facility] has been placed on 70/30 insulin.  Apparently he had missed a couple of appointments and could not get in right away and ran out of his insulin three weeks ago.  Over the last three weeks has began to feel worse and worse, and had three days of vomiting and abdominal pain.  On presentation to the emergency room, they did blood work which indicated that he indeed had ketoacidosis with a gap of 25.  Amylase and lipase were normal.  Because of the severity of his abdominal pain we did do a CT which was negative.  He was placed on the Glucommander and his gap corrected within 24 hours.  He was off his insulin drip for 24 hours prior to discharge and his gap remained normal at 5.  He was tolerating a diet.  I had put  him back on 70/30 insulin.  He tells me he may have been on 10 and 5, and actually it looks like he is going to require more than that so he will be discharged on 20 units in the morning and 15 units in the evening.  He will call his [facility] physician if his sugars are less than 70 or greater than 300. I told him that he should never stop taking his insulin and that you do not necessarily need a prescription for that type of insulin.  That did not preclude him having close follow-ups for management of his diabetes.  I gave him a Magazine features editor about complications that occur and how important it is for him to take charge of his disease early on.  #2 - DENTAL CAVITY:  Pantogram could not tell for sure if there was an abscess.  He had no fevers or leukocytosis but because of the increased pain I did opt to place him on antibiotics.  I chose clindamycin.  He was originally on Augmentin, but of course he has limited resources and this would be very expensive for him.  I gave him a 10-day prescription and told him to follow up with the dentist of his choice.  I gave him instructions at to what to tell the patient that he had based on the x-ray, and also to quote prices.  He can come and pickup his x-rays as that will reduce some of his cost at the dentist office.  #3 - ABDOMINAL PAIN:  As I described above, the CT was negative. I suspect this is due to the DKA and persistent vomiting.  He did describe some coffee-grounds emesis.  His hemoglobin remained stable this admission and on the day before discharge was normal at 14.2.  I did tell him to take Pepcid for approximately five days postdischarge and to avoid alcohol and drug abuse.  #4 - POLYSUBSTANCE ABUSE:  The patient drinks alcohol 3-4 times a week and has used crack cocaine as recently as four days prior to admission.  He denies any IV drug use.  He said he was clean for several months but got in with the wrong crowd again.  Social  work came by and talked to him and we recommend  that he get in touch with [facility] again.  #5 - TOBACCO USE:  Encouraged cessation.  Described benefits of quitting. He declined patch at this time.  DISCHARGE LABORATORY DATA:  Sodium 135, potassium 3.6, chloride 99, bicarb 31, glucose 336 (that was post breakfast), BUN 13, creatinine 1.1, calcium 9.2.  I did give the  patient 40 mEq of potassium prior to discharge.  Most recent CBC, hemoglobin 14.2, WBC 7000, platelet count 241.  Admission liver function test, total bilirubin 2.2, alk. phos. 125.  SGOT 16, SGPT 35.  I spent greater than 30 minutes arranging discharge and dictating. DD:  04/04/01 TD:  04/06/01 Job: [id number]

## 2011-01-17 NOTE — Discharge Summary (Signed)
NAME:  John Cox, John Cox NO.:  [id number]   MEDICAL RECORD NO.:  [id number]          PATIENT TYPE:  INP   LOCATION:  5511                         FACILITY:  [facility]   PHYSICIAN:  Madaline Guthrie, M.D.    DATE OF BIRTH:  [birth year]   DATE OF ADMISSION:  03/17/2005  DATE OF DISCHARGE:  03/19/2005                                 DISCHARGE SUMMARY   DISCHARGE DIAGNOSES:  1.  Diabetic ketoacidosis.  2.  Non-insulin-dependent diabetes mellitus.  3.  Polysubstance abuse.   DISCHARGE MEDICATIONS:  Insulin 70/30 -- 50 units subcutaneous a.m. and 30  units subcutaneous p.m.   DISPOSITION AND FOLLOWUP:  The patient needs to be followed up in the acute  care clinic in a week to check for insulin compliance as well as to monitor  his blood glucose levels and also to consult him because of repeated history  of noncompliance with medications.   PROCEDURES PERFORMED:  None.   CONSULTATIONS:  None.   ADMITTING HISTORY:  John Cox is a 40 year old African American male with a  history of insulin-dependent diabetes and 3 prior admission for DKA in the  last 45 days.  He presented with history of nausea, vomiting and abdominal  pain.  He had not taken his insulin for the last 3 days.  He gives a history  of polyuria, polydipsia and polyphagia, and some abdominal pain, no history  of blurred vision, syncope or chest pain, or shortness of breath.   PHYSICAL EXAMINATION:  VITAL SIGNS:  Pulse was 60, BP 111/67, temperature  97, respiratory rate 16, O2 SAT 100%.  GENERAL:  He was lethargic, easily arousable and appears uncomfortable.  EYES:  PERLA.  EOM intact.  ENT:  Patent nares.  Clear oropharynx.  __________ exudate.  Poor dentition.  NECK:  Supple.  No JVD.  No tracheal deviation.  RESPIRATORY:  Breath sounds are clear on auscultation bilaterally.  CARDIOVASCULAR:  S1 and S2 normal with no murmurs, rubs or thrills.  GI:  Bowel sounds present.  Soft, nontender.  No masses  palpable.  NEUROLOGIC:  Examination grossly intact.  No focal deficits.  PSYCHIATRIC:  Oriented x3.   LABORATORIES:  His hemoglobin was 15.6, WBC 7.6, hematocrit 47.6, platelets  49.2, ANC 5.7 and MCV of 90.  His sodium was 135, potassium 5.5, chloride  97, bicarb 13, urea 21, creatinine 1.8 and a glucose of 412.  His anion gap  was 25, bilirubin 2.6, alkaline phosphatase 125, SGOT 22, SGPT 33, protein  8.4, albumin 4.7 and calcium 10.3.  His alcohol level was less than 5.  His  blood ketones were moderate.  Urinalysis showed a specific gravity of 1.029,  pH of 5, glucose of more than 1000, ketones more than 80 and protein of 30.  His urine drug screen was positive for cocaine.   HOSPITAL COURSE:  PROBLEM #1 - DIABETIC KETOACIDOSIS, WHICH IS MOST PROBABLY  DUE TO MEDICAL NONCOMPLIANCE:  He was admitted to the ICU, aggressively  rehydrated.  Insulin drip was started.  His CBGs and BMETs were monitored  regularly and potassium was replaced  if less than 4 and infection was ruled  out with urinalysis and blood cultures and chest x-ray.  Because of his  history of repeated medical nonadherence, he was counseled to adhere to his  insulin.   PROBLEM #2 - POLYSUBSTANCE ABUSE:  His urine was positive for cocaine and  counseling was done.   DISCHARGE LABORATORIES:  At the time of discharge, his BMET was sodium of  136, potassium 4, chloride 111, CO2 17, glucose 311, BUN 12, creatinine 1.1,  calcium 8.2.  His magnesium was 2.1, his phosphorus was 2.9.  Urine culture  showed no growth.  Blood culture showed no growth.       YB/MEDQ  D:  03/24/2005  T:  03/25/2005  Job:  [id number]

## 2011-01-17 NOTE — H&P (Signed)
NAME:  John Cox, COLTER NO.:  [id number]   MEDICAL RECORD NO.:  [id number]          PATIENT TYPE:  INP   LOCATION:  1414                         FACILITY:  [facility]   PHYSICIAN:  Melissa L. Ladona Ridgel, MD  DATE OF BIRTH:  [birth year]   DATE OF ADMISSION:  05/31/2006  DATE OF DISCHARGE:                                HISTORY & PHYSICAL   CHIEF COMPLAINT:  Vomiting.   PRIMARY CARE PHYSICIAN:  [facility]; he has not seen them  in 6 months.   HISTORY OF PRESENT ILLNESS:  The patient is a 40 year old African-American  male with a known past medical history for insulin-dependent diabetes  diagnosed at the age of 32. This likely is type 1 diabetes based on his age  and the fact that he is in DKA. The patient states that last night he was  out running around. He was unable to eat secondary to nausea and vomiting.  About 1 p.m., he started to think something was wrong with his sugar and  came to the emergency room for further evaluation. He did admit to the  emergency room physician that he had been drinking beer and smoking cocaine  last evening.   REVIEW OF SYSTEMS:  He denied fever but did have chills. He had nausea and  vomiting. He denied diarrhea. He denied cough or dysuria. He has been  noncompliant in the past with his medications.   PAST MEDICAL HISTORY:  Blood clots. He is supposed to be on blood thinners.  He has diabetes for 10 years.   PAST SURGICAL HISTORY:  None.   SOCIAL HISTORY:  He smokes a pack of cigarettes a day. The last beer that he  had was yesterday, one to two, and he smokes cocaine and marijuana. He has  three children, and he is not married.   FAMILY HISTORY:  Mom is living with diabetes and hypertension. Dad is living  with no medical illnesses.   ALLERGIES:  No known drug allergies.   MEDICATIONS:  He is supposed to be on Coumadin. His last dose was 10 mg. He  takes Lantus 25 units every night. He takes a regular sliding  scale, and he  uses NPH 14 units in the a.m.   PHYSICAL EXAMINATION:  VITAL SIGNS:  Temperature was 97.5, blood pressure  145/81, pulse 86, respirations 18, saturation 97%.  GENERAL:  This is a well-developed, well-nourished, African-American male in  no acute distress.  HEENT:  He is normocephalic, atraumatic. Pupils are equal, round, and  reactive to light. Extraocular movements are intact. Mucous membranes were  moist.  NECK:  Supple. There is no JVD, no lymph nodes, and no carotid bruits.  CHEST:  Clear to auscultation. There is no rhonchi, rales or wheezes.  CARDIOVASCULAR:  Regular rate and rhythm. Positive S1 and S2. No S3 or S4.  No murmurs, rubs, or gallops.  ABDOMEN:  Mildly tender but no guarding or rebound. He does have positive  bowel sounds.  EXTREMITIES:  Show no clubbing, cyanosis, or edema.  NEUROLOGICAL:  Awake but with a glazed appearance. Power  is 5/5. DTRs are 2.   LABORATORY DATA:  Sodium 140, potassium 4.9, chloride 97, CO2 18, BUN 12,  creatinine 1.9. His glucose is 327. His AST is 30, his ALT is 41, total  bilirubin is 2.4. His gap was 25. His ABG was 7.295 with a pH of 33.1, pCO2  of 97.8, and bicarb 15.6. White count was 10.5, hemoglobin 15.7, hematocrit  47.4, platelets 303. His PT is 14.4 with an INR of 1.9. His urinalysis is  negative. His ethanol level was less than 5.   ASSESSMENT AND PLAN:  This is a 40 year old African-American male who is  noted to be noncompliant with his medication. He is currently in DKA and  recently was out drinking and doing cocaine the night before. The patient  has a history of DVT for which he is supposed to be on Coumadin for, but his  INR is subtherapeutic, reflecting his probable noncompliance.   1. Diabetic ketoacidosis. Will start the patient on a Glucomander and      aggressively fluid hydrate. Will check a hemoglobin A1c.  2. Cardiovascular. He is hemodynamically stable at this point, but we will      leave him on  telemetry until his tachycardia is resolved.  3. Will check a chest x-ray to rule out occult infection like aspiration      related to cocaine and alcohol use.  4. Polysubstance abuse. We will add multivitamin, thiamine and folate and      use p.r.n. Ativan only.  5. Gastrointestinal. N.p.o. except ice chips and advance his diet when he      is off of Glucomander.  6. Genitourinary. Obviously, he has no infection there.  7. Deep venous thrombosis prophylaxis. Will start Lovenox and reload his      Coumadin.      Melissa L. Ladona Ridgel, MD  Electronically Signed     MLT/MEDQ  D:  06/01/2006  T:  06/01/2006  Job:  [id number]

## 2011-01-17 NOTE — Discharge Summary (Signed)
NAME:  John Cox, DESA NO.:  [id number]   MEDICAL RECORD NO.:  [id number]          PATIENT TYPE:  INP   LOCATION:  6735                         FACILITY:  [facility]   PHYSICIAN:  Eliseo Gum, M.D.   DATE OF BIRTH:  Aug 25, 1971   DATE OF ADMISSION:  01/18/2006  DATE OF DISCHARGE:  01/19/2006                                 DISCHARGE SUMMARY   DISCHARGE DIAGNOSES:  1.  Diabetic ketoacidosis with type 1 diabetes.  2.  Headache.  3.  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  4.  Medical noncompliance.  5.  Polysubstance abuse with alcohol and cocaine  6.  History of right DVT in December 2006.  7.  History of depression with suicide intent.  8.  History of multiple admissions for diabetic ketoacidosis.   DISCHARGE MEDICATIONS:  1.  Lovenox 65 mg subcutaneous injection q.12h., home health nurse to      assist.  2.  Coumadin 5 mg p.o. nightly.  3.  Lantus 25 units subcutaneously at night, increased by 3-5 units per      night if sugar is greater than 180.  4.  NovoLog 4 units before each meal, NovoLog sliding scale insulin, check      blood sugars before meals and at bedtime, and I detailed a sliding scale      for him. I asked him to bring this to his next visit.   Please note the patient did not come to his appointment on Jan 19, 2006.  Instead he came back to the hospital because he was not able to get his  medications and was admitted for a quick overnight admission just to provide  him for his medications because he was not able to get them and to make sure  he did not go into DKA, which he did not. She was discharged with  medications on hand at that point. We actually gave him all of his Lovenox,  Coumadin, and insulin so that access to medications would not be a problem.  On the second discharge, which actually occurred on Jan 19, 2006,  after  being admitted on Jan 18, 2006, the patient was asked to follow up on Jan 22, 2006, at 2 p.m., but did not show to that appointment either. That was  for monitoring of his INR. He was given, at that point, Lovenox, NovoLog,  Lantus, and Coumadin samples.   PROCEDURES:  On Jan 14, 2006, a chest x-ray showed no active lung disease.   CONSULTATIONS:  There were no consultations requested during his  hospitalization.   HISTORY AND PHYSICAL:  For a full H&P, please refer to the chart, but in  brief [name] is a 40 year old African American male with a past medical  history of type 1 diabetes diagnosed in 1988 with multiple episodes of DKA  and also a past medical history of bilateral PE secondary to  antiphospholipid antibodies who came to the  [facility] ED the day prior to  admission complaining of nausea, vomiting, and generalized weakness. He ran  out of his Lantus four days prior to admission. He was found to be in DKA  and was admitted by Dr. Ladona Ridgel at [facility] and then transferred  to [facility]. On the day of admission to [facility], he felt  better, denied any nausea and vomiting, but he did still feel weak. He had  not been taking Coumadin since the last admission in April 2006 and had not  been taking Lantus for the past four days. He did not complain of fever,  chills, or urinary or respiratory complaints. His vital signs revealed a  temperature of 97, pulse 82, respirations 16, blood pressure 102/62,  saturation 98% on room air, weight 148.4 pounds. CBG was 64 on the morning  and 230 a little bit later. His physical exam was completely benign. His  hemoglobin was 15.4, white count 13.6, ANC 11.5, platelets 418,000. His  sodium was 145, potassium 4.3, chloride 103, bicarbonate 26, BUN 34,  creatinine 1.8. His baseline creatinine was 0.9 in April 2007, glucose 269.  His blood acetone is moderate. PT-INR is 13.9 and 1.1. UA showed glucose  greater  than 1000, ketones greater than 80.  Chest x-ray was as stated  before.   HOSPITAL COURSE:  Problem #1:  His DKA was most likely secondary to medical  noncompliance.   Problem #2:  For his pancreatitis we checked lipase which was normal and so  it is most likely secondary to medical noncompliance and polysubstance  abuse. He was started on Lantus 5 units and sliding scale insulin because in  the record it said somewhere that he was on Lantus 15 units, but in fact he  is on Lantus 50 units, and he was actually kept NPO at that point. He was  not eating very much and so this actually sent him back into DKA, so we  started him on the insulin drip and I want to hold the DKA protocol by  watching his blood sugars hourly and his BMET every four hours or so and  then started him on D5 until his gap closed, and he was better. We did not  have to replace his potassium or anything, but we did restart his Lantus at  15 units q.h.s. and sliding scale insulin when he was better, and  transferred him to the floor and started on carb-modified regular diet. His  blood sugars remained very high in the 200s to 300s on this regimen, and it  became clear on discharge that he needed a higher amount of insulin, but we  felt hesitant to start him on Lantus 15 q.h.s. because of the mix up in the  record. Perhaps it is a mix up in the way it is transcribed and so that is  why we discharged him on half the regular dose if it was in fact 15 and told  him to titrate it up. His hemoglobin A1c was 12.1%.   Problem #3:  For his nausea and vomiting, his lipase was 61, but it was not  consistent with pancreatitis and the swiftness with which he recovered when  his gap closed argued more for DKA again.   Problem #4:  For his history of bilateral PE and antiphospholipid antibody  syndrome, he was not therapeutic with an INR of 1.2, so we started him on Lovenox and Coumadin, and discharged him on Lovenox  to bridge him  until his  INR was therapeutic. As I said he did not get any of these medications on  discharge and so when he came back feeling malaise again and we admitted him  to make sure he did not go back into DKA and provided him with these  medications, we actually gave it to him on discharge, but have not seen him  back in follow-up since his discharge. Please note that between the time he  was discharged and he went back into the hospital he did cocaine and so it  is probably likely that polysubstance abuse is getting in the way of his  healthcare again.   Problem #5:  Polysubstance abuse.  We counseled him again and asked social  work for resources for rehab. There is some question that he might be going  to prison. He is actually looking forward to this because it would mean that  he would be away from drugs. I hope that is true, but I am not exactly sure  that prison is a drug-free environment, but I have never been in prison.   He was discharged on the 18th. His temperature was 98.1, blood pressure 104  to 131 over 40 to 86, pulse 60 to 76, respirations 16 to 20. CBG 326 to 464.  O2 saturation 97% to 98% on room air. Physical exam was totally normal  except he has very poor effect and was complaining of a headache. One blood  culture showed coagulase negative  staphylococcus, but this was felt to be consistent with a contamination. If  Mr. Bergdoll ever does show up in follow-up, what needs to followed up on is  his INR and most importantly whether or not he has been taking his Coumadin  and his Lantus, and to see that he has access to these medications.      Clearance Coots, M.D.      Eliseo Gum, M.D.  Electronically Signed    IN/MEDQ  D:  01/27/2006  T:  01/27/2006  Job:  [id number]

## 2011-01-17 NOTE — H&P (Signed)
NAME:  John Cox, John Cox NO.:  [id number]   MEDICAL RECORD NO.:  [id number]          PATIENT TYPE:  EMS   LOCATION:  MAJO                         FACILITY:  [facility]   PHYSICIAN:  Theone Stanley, MD   DATE OF BIRTH:  01/27/1971   DATE OF ADMISSION:  12/08/2004  DATE OF DISCHARGE:                                HISTORY & PHYSICAL   CHIEF COMPLAINT:  Nausea and vomiting.   HISTORY OF PRESENT ILLNESS:  John Cox is a 40 year old gentleman who has  been admitted multiple times for DKA, appears to be noncompliant.  He states  that on Saturday he started to have some nausea and vomiting.  He denies  being around sick people or eating any bad food.  He apparently was at a  home of a lady friend and decided that he was not going to take his insulin.  He thought he would be home quickly and did not bring his insulin at that  time.  He normally takes Lantus 40 units at night and Novolog sliding scale.  On average, his blood sugars range from 200 to 500.  He has had polyuria,  however, he is unable to keep anything down secondary to his nausea and  vomiting.  He has had dry heaves most of the time.  No fever.  There is a  question of hematemesis, however, there is no evidence of this.   PAST MEDICAL HISTORY:  Diabetes.  Based on the patient's presentation, most  likely has type 1.  Surgeries; none.   MEDICATIONS:  Insulin.  No other medications.   The patient has been hospitalized multiple times for DKA.   ALLERGIES:  No known drug allergies.   FAMILY HISTORY:  Mother has diabetes, history of MI and hypertension.  Maternal grandmother and uncle with diabetes.  He has two aunts who had MI,  one uncle who had MI.  When I asked him about tobacco use, he states that he  does.  It appears he smokes about a pack per day.  He lives in [place].  He denies drinking alcohol, but his previous H&P did indicate that he drank  and when I asked about illegal drugs, he says that he  does not use that,  however, again, in his previous H&P it says that he occasionally uses  marijuana and cocaine, but he denies this at this point in time.   REVIEW OF SYSTEMS:  The patient denies any fever, chills, or night sweats,  rashes or cough.  No chest pain or palpations.  Please see HPI, otherwise  all systems negative.   PHYSICAL EXAMINATION:  VITAL SIGNS:  Temperature 97.1, also 100, blood  pressure 142/83, respiratory rate 24, saturating 97% on room air.  HEENT:  Head normocephalic and atraumatic.  Eyes 3 mm, pupils equal, round,  and reactive to light.  Extraocular movements intact.  Ears and nose with no  discharge.  Throat appeared to be somewhat dry.  No exudate.  NECK:  Supple.  HEART:  Regular rate and rhythm.  No murmurs, rubs, or gallops appreciated.  LUNGS:  Clear to auscultation bilaterally.  ABDOMEN:  Soft.  The patient had mild tenderness in the midepigastric  region.  EXTREMITIES:  No edema, cyanosis, or clubbing.  SKIN:  No rashes appreciated.  GENITOURINARY:  Deferred.  NEUROLOGY:  The patient was alert and oriented x3.  Cranial nerves II-XII  grossly intact.   LABORATORY DATA:  Sodium 132, potassium 5.8, chloride 108, BUN 39, glucose  458.  Venous ABG of 7.22, bicarb 12.  Hemoglobin 19, hematocrit 58,  creatinine 1.4, lipase 18.   ASSESSMENT:  77.  A 40 year old gentleman presenting with evidence of DKA.  At this point      in time, I do not have a UA or an arterial blood gas.  Will obtain      these.  Will treat him as if he has DKA.  Put him on Glucomander      protocol.  Watch his potassium.  Once his sugars come under control,      this will drop.  Once his sugars are under control, we will switch him      to an insulin sliding scale and Lantus.  I tried to educate the patient      in regards to the reason he is having these problems and tried to tell      him that since he does not take his insulin, he gets sick that it would      be best for him  to take his insulin.  Since this is his maybe third or      fourth admission, I am not sure how much that has helped, however, maybe      with association he might better take care of himself.  2.  Electrolyte abnormalities.  The patient has pseudohyponatremia secondary      to the elevated blood sugars.  He has elevated potassium which is      secondary to his acidosis.  Will start him on a Glucomander and watch      his potassium closely.  Obtain EKG.  Give some calcium gluconate.  Admit      him to stepdown unit to closely observe him.  3.  Nausea and vomiting.  This is most likely secondary to his DKA.  Will      give him antiemetics for this and watch him closely.  He already is      feeling better.  Also obtain a tox screen especially with his history to      see if there might be other causes.  4.  Prophylaxis.  Will start him on Lovenox and Protonix.      AEJ/MEDQ  D:  12/09/2004  T:  12/09/2004  Job:  [id number]

## 2011-01-17 NOTE — Discharge Summary (Signed)
NAME:  JHAN, CONERY NO.:  [id number]   MEDICAL RECORD NO.:  [id number]          PATIENT TYPE:  INP   LOCATION:  5732                         FACILITY:  [facility]   PHYSICIAN:  Leighton Roach McDiarmid, M.D.DATE OF BIRTH:  [birth year]   DATE OF ADMISSION:  06/21/2004  DATE OF DISCHARGE:  06/23/2004                                 DISCHARGE SUMMARY   PRIMARY ATTENDING:  Leighton Roach McDiarmid, M.D.   RESIDENT:  Alvira Philips, M.D.   HISTORY OF PRESENT ILLNESS:  For history and physical, please see dictated  H&P.  Also please see that the initial history and physical previously  dictated gets copied to [facility].   DISCHARGE DIAGNOSES:  1.  Diabetes mellitus with below-knee amputation.  2.  Nausea.  3.  Tobacco abuse.  4.  Substance abuse.  5.  Hypokalemia.   DISCHARGE MEDICATIONS:  1.  Lantus 40 units subcutaneously at night.  2.  Prilosec OTC 20 mg p.o. daily p.r.n.  3.  Ibuprofen 600 mg p.o. p.r.n. for pain.   DIET:  Low-carb, diabetic-friendly diet.  The patient is to follow up with  [facility] in approximately one to two weeks.  The patient is to  call for appointment for followup for DKA and diabetic education teaching.  Please note patient followup on patient needs likely four-shot-a-day routine  with Humulin or Regular a.c. insulin injections, but the patient has poor  understanding of how to titrate a medication doses based on meals and based  on appetite and p.o. intake.  The patient will likely need outpatient  diabetic teaching to the diabetic education.   DISCHARGE DIAGNOSES:  Problem #1.  Diabetes.  The patient is a 40 year old  African American male with diabetes that is now insulin-requiring with basal  insulin and was previously on a four-shot-a-day regimen, who came in with  nausea and vomiting.  No fevers and did not take an insulin for a day or day-  and-a-half.  He came in with abdominal pain for the last two to three days  prior to admission.  The patient was started on an insulin drip and given an  IV bolus and IV rehydration.  His initial sugar when he came in was 342 with  a gap of 15.  It was subsequently closed with IV fluids and insulin drip.  He was then switched over to subcutaneous insulin 70/30 until he was able to  restart on his decreased dosage of 40 units on day #2.  His sugars came down  and his gap closed subsequently and on day of discharge his a.m. sugars were  in the 85-125 range on his Lantus dose.  The patient was tolerating good  p.o. intake.  He had no further nausea or vomiting, no fevers, no real  source of infection or sign of DKA; more likely medication compliant.  The  patient tried to get diabetic inpatient teaching for the patient to adjust  Lantus based on p.o. intake and sick days. I have recommended to the  patient that he take approximately half dose of his Lantus on  days when he  is having decreased p.o. intake.  The patient will likely need four-shot-a-  day routine in order to accurately control his diabetes as hemoglobin A1C  during admission was 13.6 representing poorly controlled diabetes.  Also  recommended the patient refrain from polysubstance abuse as noted further  down.  This will tend to exacerbate episodes of DKA.  The patient will  follow up with [facility] for modification of his insulin regimen and  further monitoring as he will likely need aggressive control in order to  prevent further diabetic complications.   Problem #2.  Tobacco abuse.  The patient currently smoking approximately a  pack per day for the last 20 years.  The patient at this point in time does  not want to quit.  Recommend further counseling on tobacco abuse.   Problem #3.  Polysubstance abuse.  The patient has a history of marijuana,  cocaine and alcohol abuse with some binge drinking on the weekends with  alcohol.  He noted prior to admission some cocaine derivative type use prior  to  admission.  Offered outpatient rehabilitation to the patient and the  patient has declined at this point in time as he says he does not have a  problem.   Problem #4.  Hypokalemia.  The patient was supplemented one dose 40 mEq p.o.  K-Dur when his potassium was down to 3.2.  This was likely secondary to  hydration and insulin and reinitiation of his insulin.  Potassium was stable  at the time of discharge and no further supplementation was needed.  Would  recommend monitoring BMP as an outpatient.   Problem #5.  Disposition.  The patient was discharged on June 23, 2004.   Problem #6.  The patient likely has some episodes of diabetic gastroparesis  likely secondary to increased A1C.  We do not feel adding Reglan or any  other medication at this point in time since the patient is already  financially strapped for medication.  The best course of treatment is to get  the patient's CBGs down, and this way likely to help with this diabetic  gastroparesis, and then add further medications as necessary such as Reglan  or Zelnorm.   The patient was discharged on June 23, 2004 to home in stable and  improved condition.  The patient will follow up with [facility] as noted  above in approximately one to two weeks.  The patient will call for  appointment.       RM/MEDQ  D:  06/23/2004  T:  06/23/2004  Job:  [id number]   cc:   [facility]

## 2011-01-17 NOTE — H&P (Signed)
NAME:  John Cox, MAIN NO.:  [id number]   MEDICAL RECORD NO.:  [id number]          PATIENT TYPE:  INP   LOCATION:  4706                         FACILITY:  [facility]   PHYSICIAN:  Fleet Contras, M.D.    DATE OF BIRTH:  1971/07/27   DATE OF ADMISSION:  07/14/2004  DATE OF DISCHARGE:  07/17/2004                                HISTORY & PHYSICAL   CHIEF COMPLAINT:  Vomiting.   HISTORY OF PRESENT ILLNESS:  Mr. Mossberg is a 40 year old African-American  gentleman who presented to the emergency room at [facility] with  episode of vomiting on the morning of presentation.  Today he had been  taking his insulin and had 1 episode of vomiting that morning but could not  keep food or drink down subsequently.  On arrival in the emergency room, his  vital signs were stable.  Blood pressure 112/65, heart rate 93, respiratory  rate of 20, temperature 97.  __________  O2 saturations on room air are both  98%.  His initial fingerstick glucose was over 400.  He was therefore  admitted for further evaluation and treatment.   PAST MEDICAL HISTORY:  Significant for type 2 diabetes.   PAST SURGICAL HISTORY:  Not significant.   FAMILY HISTORY AND SOCIAL HISTORY:  He is single, employed.  Currently he  does not smoke or use tobacco or illicit drugs.   REVIEW OF SYSTEMS:  Not significant.   PHYSICAL EXAMINATION:  He is alert, not in any acute respiratory or painful  distress.  His vital signs were stable, chest was clear to auscultation.  S1  & S2 were heard.  He was mildly dehydrated with dry oral and tongue mucosa.  Abdomen was soft and nontender with no masses.  Bowel sounds were present.  Extremities showed no edema or calf tenderness or swelling.  CNS:  He was  alert and oriented x3 with no focal neurological deficits.   LABORATORY DATA:  His initial laboratory data showed white count 6.4,  hemoglobin 16.3, hematocrit 47.9, platelet count 366.  Sodium 131, potassium  4.8,  chloride 103, BUN 27, glucose 408.  Serum pH was 7.23.  Urinalysis  initially was negative except for ketones over 80 mg/dL.  Lipase was 18,  amylase 53.  Liver function tests were essentially normal.  Blood acetone  was small.  He was therefore admitted to a medical bed with type 2 diabetes  mellitus, mild diabetic ketoacidosis.   ADMISSION DIAGNOSES:  1.  Type 2 diabetes mellitus controlled.  2.  Diabetic ketoacidosis mild.  3.  Dehydration.   PLAN OF CARE:  Admit to medical bed, vital signs q.4 h., IV fluids with 1/2  normal saline, 30 mEq potassium  __________  CBGs q.4h.  Is to continue  insulin drip at 3 units per hour, Reglan, CPK below 125, Zofran 8 mg IV  q.6h. p.r.n. for nausea or vomiting.  __________ .       EA/MEDQ  D:  09/03/2004  T:  09/03/2004  Job:  [id number]

## 2011-01-17 NOTE — Discharge Summary (Signed)
NAME:  John Cox, John Cox NO.:  [id number]   MEDICAL RECORD NO.:  [id number]                   PATIENT TYPE:  INP   LOCATION:  5704                                 FACILITY:  [facility]   PHYSICIAN:  Melissa L. Ladona Ridgel, MD               DATE OF BIRTH:  March 30, 1971   DATE OF ADMISSION:  01/15/2004  DATE OF DISCHARGE:  01/18/2004                                 DISCHARGE SUMMARY   ADMITTING DIAGNOSES:  1. Nausea and vomiting.  2. Diabetic ketoacidosis.  3. Abdominal pain.  4. Tobacco abuse.   DISCHARGE DIAGNOSES:  1. Diabetic ketoacidosis, resolved.  2. Diabetes; patient resumed his insulin and oral therapies for his     diabetes.  3. Tobacco abuse; the patient remains actively smoking.   MEDICATIONS AT TIME OF DISCHARGE:  1. Reglan 10 mg p.o. 1/2 hour before meals t.i.d. and at bedtime.  2. Avandia 4 mg p.o. daily.  3. Glucotrol XL 10 mg p.o. daily.  4. Lantus 45 units subcutaneously at bedtime.   The patient was provided with assistance for his medications and directed to  follow up with [facility] for further needs.   HISTORY OF PRESENT ILLNESS:  The patient is a 40 year old gentleman with  insulin-dependent diabetes mellitus x7 years.  He states that he went out  over the weekend with friends, consumed alcohol and was noncompliant with  his medications.  Forty-eight hours prior to admission, he began to feel  poorly after returning from an early-morning club visit.  He went to bed and  had difficulty waking up.  He slept pretty much for the whole day and then  developed some abdominal pain; he came into the emergency room for  evaluation, where he was found to be in DKA.   HOSPITAL COURSE:  He was treated with the standard rehydration, insulin drip  and monitored on the floor.  By Jan 16, 2004, the patient still complained  of some mild abdominal pain and was not eating very well; he was therefore  started on Reglan prior to his meals and his  Lantus was restarted and  adjusted up to his home doses.  He initially was evaluated in the intensive  care setting because of bed situation, however, was ultimately transferred  to the general medical floor.  The Reglan that was started did assist in  keeping his abdominal pain in good control, suggesting that he may have an  element of gastroparesis secondary to his diabetes.  On Jan 18, 2004, the  patient was restarted on his Glucotrol XL and Avandia, which he had  discontinued because of financial problems.  His Lantus had been up-titrated  to his home doses and he was able to tolerate food.  The __________  provided him with prescriptions for medications to tide him over until he  saw the [facility] folks.   PHYSICAL EXAM ON DAY OF DISCHARGE:  VITAL SIGNS:  Temperature was 96.8,  blood pressure 103/49, pulse of 65, respiratory rate was 18, saturations  were 98%.  His general trending glucoses had been in the mid-200s.  His  amylase and lipase were within normal limits.  GENERAL:  He generally was in no acute distress.  HEENT:  Pupils were equal, round, reactive to light.  Extraocular muscles  were intact.  Mucous membranes were moist.  He had mild posterior  pharyngitis secondary to vomiting.  CHEST:  Chest was clear to auscultation, no rhonchi, rales or wheezes.  CARDIOVASCULAR:  Regular rate and rhythm.  ABDOMEN:  Abdomen was soft, nontender and nondistended with positive bowel  sounds.  EXTREMITIES:  Extremities showed no edema and 2+ pulses.   DISPOSITION:  The patient was therefore discharged home with followup to  [facility] and as stated, provided with medications to tide him over.                                                Melissa L. Ladona Ridgel, MD    MLT/MEDQ  D:  02/01/2004  T:  02/03/2004  Job:  [id number]

## 2011-02-03 ENCOUNTER — Emergency Department (HOSPITAL_COMMUNITY)
Admission: EM | Admit: 2011-02-03 | Discharge: 2011-02-03 | Discharge status: Against Medical Advice | Payer: Self-pay | Attending: Emergency Medicine | Admitting: Emergency Medicine

## 2011-06-02 LAB — HEPATITIS B SURFACE ANTIGEN: Hepatitis B Surface Ag: NEGATIVE

## 2011-06-02 LAB — CARDIAC PANEL(CRET KIN+CKTOT+MB+TROPI)
CK, MB: 2.7
Relative Index: 1
Relative Index: 1
Relative Index: 1
Total CK: 202
Troponin I: 0.01
Troponin I: 0.01
Troponin I: 0.01

## 2011-06-02 LAB — URINALYSIS, ROUTINE W REFLEX MICROSCOPIC
Glucose, UA: >1000 — AB
Hgb urine dipstick: NEGATIVE
Leukocytes, UA: NEGATIVE
Protein, ur: 100 — AB
Specific Gravity, Urine: 1.04 — ABNORMAL HIGH
Urobilinogen, UA: 0.2

## 2011-06-02 LAB — BASIC METABOLIC PANEL
BUN: 10
BUN: 22
BUN: 28 — ABNORMAL HIGH
CO2: 28
CO2: 31
Chloride: 100
Chloride: 102
Chloride: 96
Creatinine, Ser: 1.11
Creatinine, Ser: 1.23
GFR calc non Af Amer: 50 — ABNORMAL LOW
Glucose, Bld: 142 — ABNORMAL HIGH
Glucose, Bld: 325 — ABNORMAL HIGH
Potassium: 3.6
Potassium: 4.6
Sodium: 135

## 2011-06-02 LAB — GLUCOSE, CAPILLARY
Glucose-Capillary: 117 — ABNORMAL HIGH
Glucose-Capillary: 143 — ABNORMAL HIGH
Glucose-Capillary: 145 — ABNORMAL HIGH
Glucose-Capillary: 159 — ABNORMAL HIGH
Glucose-Capillary: 271 — ABNORMAL HIGH
Glucose-Capillary: 272 — ABNORMAL HIGH
Glucose-Capillary: 312 — ABNORMAL HIGH
Glucose-Capillary: 342 — ABNORMAL HIGH
Glucose-Capillary: 351 — ABNORMAL HIGH

## 2011-06-02 LAB — CBC
HCT: 43
HCT: 47.2
Hemoglobin: 15.6
MCHC: 34.3
MCV: 85.2
Platelets: 199
Platelets: 250
RBC: 4.96
RBC: 5.42
RDW: 13.2
RDW: 13.7
WBC: 7.8

## 2011-06-02 LAB — DIFFERENTIAL
Basophils Absolute: 0
Basophils Relative: 0
Basophils Relative: 0
Eosinophils Absolute: 0
Eosinophils Absolute: 0.1
Monocytes Relative: 11
Neutro Abs: 6.8
Neutrophils Relative %: 51
Neutrophils Relative %: 73

## 2011-06-02 LAB — COMPREHENSIVE METABOLIC PANEL
Alkaline Phosphatase: 82
BUN: 29 — ABNORMAL HIGH
CO2: 23
GFR calc non Af Amer: 54 — ABNORMAL LOW
Glucose, Bld: 245 — ABNORMAL HIGH
Potassium: 5.1
Total Bilirubin: 2.9 — ABNORMAL HIGH
Total Protein: 7.6

## 2011-06-02 LAB — URINE MICROSCOPIC-ADD ON

## 2011-06-02 LAB — PROTIME-INR
INR: 1
INR: 1
INR: 1
INR: 1.9 — ABNORMAL HIGH
Prothrombin Time: 13.6
Prothrombin Time: 13.7

## 2011-06-02 LAB — APTT
aPTT: 23 — ABNORMAL LOW
aPTT: 24

## 2011-06-02 LAB — CK TOTAL AND CKMB (NOT AT ARMC): Relative Index: 1.1

## 2011-06-02 LAB — POCT I-STAT 3, ART BLOOD GAS (G3+)
Bicarbonate: 26.4 — ABNORMAL HIGH
TCO2: 28
pCO2 arterial: 46.5 — ABNORMAL HIGH
pH, Arterial: 7.356

## 2011-06-02 LAB — RAPID URINE DRUG SCREEN, HOSP PERFORMED
Amphetamines: NOT DETECTED
Barbiturates: NOT DETECTED
Benzodiazepines: NOT DETECTED

## 2011-06-02 LAB — HEMOGLOBIN: Hemoglobin: 13

## 2011-06-02 LAB — LIPID PANEL
Cholesterol: 208 — ABNORMAL HIGH
LDL Cholesterol: 101 — ABNORMAL HIGH
Triglycerides: 315 — ABNORMAL HIGH

## 2011-06-02 LAB — SALICYLATE LEVEL: Salicylate Lvl: <4

## 2011-06-02 LAB — ALCOHOL,  ISOPROPYL (ISOPROPANOL): Isopropanol: NEGATIVE

## 2011-06-02 LAB — HEMOGLOBIN A1C
Hgb A1c MFr Bld: 10.2 — ABNORMAL HIGH
Mean Plasma Glucose: 246

## 2011-06-02 LAB — LIPASE, BLOOD: Lipase: 30

## 2011-06-02 LAB — TROPONIN I: Troponin I: 0.01

## 2011-06-02 LAB — HEPATITIS PANEL, ACUTE
Hep A IgM: NEGATIVE
Hep B C IgM: NEGATIVE

## 2011-06-02 LAB — ETHYLENE GLYCOL: Ethylene Glycol Lvl: NEGATIVE

## 2011-06-02 LAB — KETONES, QUALITATIVE

## 2011-06-02 LAB — ETHANOL: Alcohol, Ethyl (B): <5

## 2011-06-03 LAB — COMPREHENSIVE METABOLIC PANEL
ALT: 29
ALT: UNDETERMINED
Albumin: 3.8
Alkaline Phosphatase: 72
Calcium: UNDETERMINED
Glucose, Bld: 408 — ABNORMAL HIGH
Potassium: 4.1
Sodium: 143
Sodium: UNDETERMINED
Total Protein: 6
Total Protein: 6.7

## 2011-06-03 LAB — DIFFERENTIAL
Eosinophils Absolute: 0
Lymphocytes Relative: 20
Lymphs Abs: 1
Monocytes Relative: 8
Neutro Abs: 3.6
Neutrophils Relative %: 71

## 2011-06-03 LAB — GLUCOSE, CAPILLARY: Glucose-Capillary: 382 — ABNORMAL HIGH

## 2011-06-03 LAB — URINE MICROSCOPIC-ADD ON

## 2011-06-03 LAB — URINALYSIS, ROUTINE W REFLEX MICROSCOPIC
Glucose, UA: >1000 — AB
Hgb urine dipstick: NEGATIVE
Specific Gravity, Urine: 1.038 — ABNORMAL HIGH
pH: 5

## 2011-06-03 LAB — CBC
Hemoglobin: 14.7
MCHC: 34
RDW: 13.5

## 2011-06-06 LAB — I-STAT 8, (EC8 V) (CONVERTED LAB)
BUN: 41 — ABNORMAL HIGH
Glucose, Bld: 160 — ABNORMAL HIGH
Hemoglobin: 19.4 — ABNORMAL HIGH
Potassium: 4.8
Sodium: 142
TCO2: 32

## 2011-06-06 LAB — POCT I-STAT CREATININE: Creatinine, Ser: 1.7 — ABNORMAL HIGH

## 2011-06-12 LAB — COMPREHENSIVE METABOLIC PANEL
ALT: 23
AST: 33
AST: 40 — ABNORMAL HIGH
Albumin: 3.7
BUN: 17
CO2: 26
Chloride: 96
Chloride: 99
Creatinine, Ser: 0.8
Creatinine, Ser: 1.73 — ABNORMAL HIGH
GFR calc Af Amer: 54 — ABNORMAL LOW
GFR calc Af Amer: >60
GFR calc non Af Amer: >60
Glucose, Bld: 157 — ABNORMAL HIGH
Potassium: 3.8
Total Bilirubin: 1.7 — ABNORMAL HIGH
Total Bilirubin: 2.6 — ABNORMAL HIGH
Total Protein: 6.4

## 2011-06-12 LAB — DIFFERENTIAL
Basophils Absolute: 0
Basophils Relative: 1
Eosinophils Absolute: 0
Eosinophils Relative: 0
Eosinophils Relative: 0
Lymphocytes Relative: 31
Lymphocytes Relative: 31
Lymphs Abs: 1.9
Monocytes Absolute: 0.5
Monocytes Relative: 8
Neutro Abs: 3.8

## 2011-06-12 LAB — HEPATIC FUNCTION PANEL
AST: 17
AST: 33
Albumin: 2.9 — ABNORMAL LOW
Albumin: 3.1 — ABNORMAL LOW
Alkaline Phosphatase: 60
Alkaline Phosphatase: 61
Bilirubin, Direct: 0.4 — ABNORMAL HIGH
Total Bilirubin: 1.5 — ABNORMAL HIGH
Total Bilirubin: 1.6 — ABNORMAL HIGH
Total Protein: 5.3 — ABNORMAL LOW

## 2011-06-12 LAB — URINALYSIS, ROUTINE W REFLEX MICROSCOPIC
Bilirubin Urine: NEGATIVE
Leukocytes, UA: NEGATIVE
Leukocytes, UA: NEGATIVE
Nitrite: NEGATIVE
Nitrite: NEGATIVE
Specific Gravity, Urine: 1.031 — ABNORMAL HIGH
Specific Gravity, Urine: 1.034 — ABNORMAL HIGH
Urobilinogen, UA: 0.2
Urobilinogen, UA: 1
pH: 5.5
pH: 6

## 2011-06-12 LAB — POCT I-STAT 3, ART BLOOD GAS (G3+)
Acid-Base Excess: 1
Bicarbonate: 26.7 — ABNORMAL HIGH
Patient temperature: 98.7
TCO2: 28
pH, Arterial: 7.392
pO2, Arterial: 76 — ABNORMAL LOW

## 2011-06-12 LAB — HEMOGLOBIN A1C: Hgb A1c MFr Bld: 10.5 — ABNORMAL HIGH

## 2011-06-12 LAB — PROTIME-INR
INR: 0.9
INR: 1.2
INR: 1.2
Prothrombin Time: 12.8
Prothrombin Time: 15.3 — ABNORMAL HIGH
Prothrombin Time: 19.7 — ABNORMAL HIGH
Prothrombin Time: 24.6 — ABNORMAL HIGH

## 2011-06-12 LAB — I-STAT 8, (EC8 V) (CONVERTED LAB)
Acid-base deficit: 2
Bicarbonate: 23.6
Chloride: 101
HCT: 57 — ABNORMAL HIGH
Operator id: 279831
TCO2: 25
pCO2, Ven: 41.9 — ABNORMAL LOW

## 2011-06-12 LAB — RAPID URINE DRUG SCREEN, HOSP PERFORMED
Amphetamines: NOT DETECTED
Amphetamines: NOT DETECTED
Benzodiazepines: NOT DETECTED
Opiates: NOT DETECTED
Tetrahydrocannabinol: NOT DETECTED
Tetrahydrocannabinol: NOT DETECTED

## 2011-06-12 LAB — BASIC METABOLIC PANEL
BUN: 6
CO2: 22
CO2: 27
Calcium: 8.9
Calcium: 9.9
Chloride: 101
Chloride: 101
Creatinine, Ser: 1.05
Creatinine, Ser: 1.47
GFR calc Af Amer: >60
GFR calc non Af Amer: >60
GFR calc non Af Amer: >60
Glucose, Bld: 114 — ABNORMAL HIGH
Glucose, Bld: 217 — ABNORMAL HIGH
Potassium: 3.3 — ABNORMAL LOW
Sodium: 136
Sodium: 142

## 2011-06-12 LAB — URINE MICROSCOPIC-ADD ON

## 2011-06-12 LAB — ACETAMINOPHEN LEVEL
Acetaminophen (Tylenol), Serum: 121 — ABNORMAL HIGH
Acetaminophen (Tylenol), Serum: <10 — ABNORMAL LOW

## 2011-06-12 LAB — APTT
aPTT: 20 — ABNORMAL LOW
aPTT: 21 — ABNORMAL LOW

## 2011-06-12 LAB — ETHANOL: Alcohol, Ethyl (B): <5

## 2011-06-12 LAB — CBC
HCT: 38 — ABNORMAL LOW
HCT: 48.4
Hemoglobin: 11.6 — ABNORMAL LOW
MCHC: 33.5
MCHC: 34
MCV: 83.9
Platelets: 275
Platelets: 383
RBC: 4.16 — ABNORMAL LOW
RDW: 15.3 — ABNORMAL HIGH
RDW: 15.3 — ABNORMAL HIGH
WBC: 5.3
WBC: 6.2

## 2011-06-12 LAB — SALICYLATE LEVEL: Salicylate Lvl: 11.5

## 2011-06-12 LAB — MAGNESIUM: Magnesium: 1.8

## 2011-06-12 LAB — PHOSPHORUS: Phosphorus: 3.3

## 2011-06-12 LAB — LACTIC ACID, PLASMA: Lactic Acid, Venous: 2.1

## 2011-06-13 LAB — CBC
HCT: 37.6 — ABNORMAL LOW
HCT: 36.5 — ABNORMAL LOW
HCT: 37.3 — ABNORMAL LOW
HCT: 37.3 — ABNORMAL LOW
HCT: 37.8 — ABNORMAL LOW
Hemoglobin: 12.4 — ABNORMAL LOW
Hemoglobin: 12.4 — ABNORMAL LOW
Hemoglobin: 12.5 — ABNORMAL LOW
MCV: 83.7
MCV: 83.7
MCV: 83.8
MCV: 84.1
MCV: 84.6
Platelets: 253
Platelets: 245
Platelets: 259
Platelets: 281
Platelets: 287
Platelets: 292
RBC: 4.36
RBC: 4.41
RBC: 4.41
RBC: 4.42
RDW: 14.9 — ABNORMAL HIGH
RDW: 14.4 — ABNORMAL HIGH
RDW: 14.8 — ABNORMAL HIGH
WBC: 3.4 — ABNORMAL LOW
WBC: 3.9 — ABNORMAL LOW
WBC: 3.9 — ABNORMAL LOW
WBC: 4
WBC: 4.1
WBC: 4.1

## 2011-06-13 LAB — BASIC METABOLIC PANEL
Calcium: 8.7
Chloride: 102
Chloride: 102
GFR calc Af Amer: >60
GFR calc Af Amer: >60
GFR calc non Af Amer: >60
GFR calc non Af Amer: >60
GFR calc non Af Amer: >60
Potassium: 4.2
Potassium: 4.4
Sodium: 136
Sodium: 138
Sodium: 140

## 2011-06-13 LAB — URINALYSIS, ROUTINE W REFLEX MICROSCOPIC
Bilirubin Urine: NEGATIVE
Glucose, UA: >1000 — AB
Hgb urine dipstick: NEGATIVE
Ketones, ur: 15 — AB
Leukocytes, UA: NEGATIVE
pH: 5.5

## 2011-06-13 LAB — COMPREHENSIVE METABOLIC PANEL
ALT: 25
AST: 21
Albumin: 2.9 — ABNORMAL LOW
Albumin: 3.3 — ABNORMAL LOW
BUN: 7
CO2: 27
CO2: 32
Chloride: 105
Chloride: 99
Creatinine, Ser: 0.96
Creatinine, Ser: 0.99
GFR calc Af Amer: >60
GFR calc non Af Amer: >60
GFR calc non Af Amer: >60
Potassium: 4.2
Sodium: 137
Total Bilirubin: 1
Total Bilirubin: 1

## 2011-06-13 LAB — RAPID URINE DRUG SCREEN, HOSP PERFORMED
Barbiturates: NOT DETECTED
Cocaine: NOT DETECTED
Opiates: POSITIVE — AB

## 2011-06-13 LAB — HIV ANTIBODY (ROUTINE TESTING W REFLEX): HIV: NONREACTIVE

## 2011-06-13 LAB — PROTIME-INR
INR: 1
INR: 1.2
INR: 1.5
Prothrombin Time: 25.1 — ABNORMAL HIGH
Prothrombin Time: 18.1 — ABNORMAL HIGH

## 2011-06-13 LAB — CARDIAC PANEL(CRET KIN+CKTOT+MB+TROPI)
CK, MB: 1.8
CK, MB: 1.9
Relative Index: 1.2
Relative Index: 1.2
Total CK: 165
Troponin I: 0.01
Troponin I: 0.01
Troponin I: 0.02

## 2011-06-13 LAB — LACTIC ACID, PLASMA
Lactic Acid, Venous: 1.3
Lactic Acid, Venous: 2.7 — ABNORMAL HIGH

## 2011-06-13 LAB — LIPASE, BLOOD: Lipase: 14

## 2011-06-13 LAB — URINE MICROSCOPIC-ADD ON

## 2011-06-13 LAB — HEPARIN LEVEL (UNFRACTIONATED)
Heparin Unfractionated: 0.39
Heparin Unfractionated: 0.53
Heparin Unfractionated: 0.87 — ABNORMAL HIGH

## 2011-06-13 LAB — DIFFERENTIAL
Basophils Absolute: 0
Eosinophils Absolute: 0
Eosinophils Relative: 1
Lymphocytes Relative: 42
Lymphs Abs: 1.6
Monocytes Absolute: 0.2

## 2011-06-13 LAB — APTT: aPTT: 23 — ABNORMAL LOW

## 2011-06-13 LAB — OCCULT BLOOD X 1 CARD TO LAB, STOOL: Fecal Occult Bld: NEGATIVE

## 2011-08-27 ENCOUNTER — Encounter: Payer: Self-pay | Admitting: *Deleted

## 2011-08-27 ENCOUNTER — Emergency Department (HOSPITAL_COMMUNITY)
Admission: EM | Admit: 2011-08-27 | Discharge: 2011-08-27 | Disposition: A | Discharge status: Home / Self Care | Payer: Self-pay | Attending: Emergency Medicine | Admitting: Emergency Medicine

## 2011-08-27 DIAGNOSIS — R1110 Vomiting, unspecified: Secondary | ICD-10-CM | POA: Insufficient documentation

## 2011-08-27 DIAGNOSIS — E119 Type 2 diabetes mellitus without complications: Secondary | ICD-10-CM | POA: Insufficient documentation

## 2011-08-27 DIAGNOSIS — K089 Disorder of teeth and supporting structures, unspecified: Secondary | ICD-10-CM | POA: Insufficient documentation

## 2011-08-27 DIAGNOSIS — R739 Hyperglycemia, unspecified: Secondary | ICD-10-CM

## 2011-08-27 DIAGNOSIS — K029 Dental caries, unspecified: Secondary | ICD-10-CM | POA: Insufficient documentation

## 2011-08-27 DIAGNOSIS — F17200 Nicotine dependence, unspecified, uncomplicated: Secondary | ICD-10-CM | POA: Insufficient documentation

## 2011-08-27 DIAGNOSIS — K0889 Other specified disorders of teeth and supporting structures: Secondary | ICD-10-CM

## 2011-08-27 LAB — DIFFERENTIAL
Basophils Absolute: 0 10*3/uL (ref 0.0–0.1)
Eosinophils Absolute: 0 10*3/uL (ref 0.0–0.7)
Eosinophils Relative: 1 % (ref 0–5)
Neutrophils Relative %: 73 % (ref 43–77)

## 2011-08-27 LAB — POCT I-STAT, CHEM 8
BUN: 24 mg/dL — ABNORMAL HIGH (ref 6–23)
Calcium, Ion: 1.13 mmol/L (ref 1.12–1.32)
HCT: 50 % (ref 39.0–52.0)
Hemoglobin: 17 g/dL (ref 13.0–17.0)
Sodium: 132 mEq/L — ABNORMAL LOW (ref 135–145)
TCO2: 27 mmol/L (ref 0–100)

## 2011-08-27 LAB — CBC
MCH: 28.6 pg (ref 26.0–34.0)
MCV: 83.7 fL (ref 78.0–100.0)
Platelets: 273 10*3/uL (ref 150–400)
RBC: 5.03 MIL/uL (ref 4.22–5.81)
RDW: 14.2 % (ref 11.5–15.5)
WBC: 5.9 10*3/uL (ref 4.0–10.5)

## 2011-08-27 LAB — URINALYSIS, ROUTINE W REFLEX MICROSCOPIC
Ketones, ur: 15 mg/dL — AB
Leukocytes, UA: NEGATIVE
Nitrite: NEGATIVE
Protein, ur: NEGATIVE mg/dL
Urobilinogen, UA: 0.2 mg/dL (ref 0.0–1.0)

## 2011-08-27 LAB — GLUCOSE, CAPILLARY: Glucose-Capillary: 508 mg/dL — ABNORMAL HIGH (ref 70–99)

## 2011-08-27 LAB — URINE MICROSCOPIC-ADD ON

## 2011-08-27 MED ORDER — DEXTROSE 50 % IV SOLN: 25.0000 mL | INTRAVENOUS | Status: DC | PRN

## 2011-08-27 MED ORDER — INSULIN REGULAR BOLUS VIA INFUSION
0.0000 [IU] | Freq: Three times a day (TID) | INTRAVENOUS | Status: DC
  Filled 2011-08-27: qty 10

## 2011-08-27 MED ORDER — MORPHINE SULFATE 4 MG/ML IJ SOLN
4.0000 mg | Freq: Once | INTRAMUSCULAR | Status: AC
  Administered 2011-08-27: 4 mg via INTRAVENOUS
  Filled 2011-08-27: qty 1

## 2011-08-27 MED ORDER — DEXTROSE-NACL 5-0.45 % IV SOLN: INTRAVENOUS | Status: DC

## 2011-08-27 MED ORDER — SODIUM CHLORIDE 0.9 % IV SOLN
INTRAVENOUS | Status: DC
  Administered 2011-08-27: 15:00:00 via INTRAVENOUS
  Administered 2011-08-27: 999 mL via INTRAVENOUS

## 2011-08-27 MED ORDER — PENICILLIN V POTASSIUM 500 MG PO TABS: 500.0000 mg | ORAL_TABLET | Freq: Four times a day (QID) | ORAL | Status: AC

## 2011-08-27 MED ORDER — HYDROCODONE-ACETAMINOPHEN 5-325 MG PO TABS: 1.0000 | ORAL_TABLET | Freq: Four times a day (QID) | ORAL | Status: AC | PRN

## 2011-08-27 MED ORDER — SODIUM CHLORIDE 0.9 % IV SOLN
INTRAVENOUS | Status: DC
  Administered 2011-08-27: 5.4 [IU]/h via INTRAVENOUS
  Filled 2011-08-27: qty 1

## 2011-08-27 MED ORDER — SODIUM CHLORIDE 0.9 % IV BOLUS (SEPSIS)
1000.0000 mL | Freq: Once | INTRAVENOUS | Status: AC
  Administered 2011-08-27: 1000 mL via INTRAVENOUS

## 2011-08-27 NOTE — Progress Notes (Signed)
CM reviewed EPIC information after speaking with RN about concerns with pt.  Spoke with Kyl[facility] at [facility] to confirm pt is listed in [facility] but no indications he ever arrived to be seen by a provider.

## 2011-08-27 NOTE — Progress Notes (Signed)
ED CM spoke with pt in detail in ED room #16.  Pt noted to have his sheet over his head throughout CM conversation with him but answered all of MC's questions.  Confirms he has not seen any doctor in 2 or more years and has been getting insulin from [place], has a old glucometer now without strips and self administrating shots himself.  Discussed importance of a pcp to monitor medications and prescriptions, at least 1-2 times/yr. Discussed availability and cost for self pay [place] providers.  Discussed [facility], [facility] Discussed low cost DM supplies and medications. Discuss that Dm supplies are not provided by [facility] DM program.  Discussed medication assistance through local health department and individual drug pt assistance programs. Pt confirms he has been to "several" Dm education classes already since dx in 1998.  Provided pt with a list of self pay doctors, low cost medication/Dm supplies, financial assistance resources, housing and other [employer].  Pt voiced concern with dental care.  [facility]ed [facility], [facility] for discounted dental care. CM attempted to get pt an eligibility appointment at [facility] but they are not receiving any outpatient referrals at this time. Updated ED RN and ED MD. Pt voice being in pain CM updated RN.

## 2011-08-27 NOTE — ED Notes (Signed)
Pt's cbg 508

## 2011-08-27 NOTE — ED Notes (Signed)
Pt states "I just go to [facility] & buy the insulin, haven't seen a doc x 2 yrs, I've been vomiting for a day or so"

## 2011-08-27 NOTE — ED Provider Notes (Signed)
History     CSN: [id number]  Arrival date & time 08/27/11  1106   First MD Initiated Contact with Patient 08/27/11 1139      Chief Complaint  Patient presents with  . Emesis  . Fatigue    (Consider location/radiation/quality/duration/timing/severity/associated sxs/prior treatment) HPI Comments: Patient with hx diabetes reports N/V that began last night, right sided dental pain, and uncontrolled blood sugars.  Emesis is contents of his stomach.  Associated urinary frequency.  Reports that last night EMS was called to his house because he had a blood sugar in the 20s, states he does not remember the incident but once they corrected his sugar he refused transport to the hospital.  States he gets insulin over the counter and has not seen a doctor in several years ([facility]).  Has previously been admitted to the hospital for uncontrolled DM and DKA.    Patient is a 40 y.o. male presenting with vomiting. The history is provided by the patient.  Emesis  This is a new problem. The current episode started yesterday. The emesis has an appearance of stomach contents. There has been no fever. Associated symptoms include abdominal pain. Pertinent negatives include no diarrhea. Associated symptoms comments: Dental pain.    Past Medical History  Diagnosis Date  . Diabetes mellitus     History reviewed. No pertinent past surgical history.  No family history on file.  History  Substance Use Topics  . Smoking status: Current Everyday Smoker -- 1.0 packs/day  . Smokeless tobacco: Not on file  . Alcohol Use: Yes     12 pk on w/e      Review of Systems  Gastrointestinal: Positive for vomiting and abdominal pain. Negative for diarrhea.  All other systems reviewed and are negative.    Allergies  Review of patient's allergies indicates no known allergies.  Home Medications   Current Outpatient Rx  Name Route Sig Dispense Refill  . INSULIN ISOPHANE & REGULAR  (70-30) 100 UNIT/ML [place] SUSP Subcutaneous Inject 30 Units into the skin 2 (two) times daily. PT GETS THIS OVER THE COUNTER AT [facility]    . INSULIN REGULAR HUMAN 100 UNIT/ML IJ SOLN Subcutaneous Inject 6 Units into the skin 2 (two) times daily before a meal. PT GETS THIS OTC AT [facility]      BP 132/67  Pulse 101  Temp(Src) 98.8 F (37.1 C) (Oral)  Resp 18  Wt 178 lb (80.74 kg)  SpO2 99%  Physical Exam  Nursing note and vitals reviewed. Constitutional: He is oriented to person, place, and time. He appears well-developed and well-nourished.  HENT:  Head: Normocephalic and atraumatic.  Mouth/Throat: Uvula is midline, oropharynx is clear and moist and mucous membranes are normal. Abnormal dentition. Dental caries present. No uvula swelling.    Neck: Neck supple.  Cardiovascular: Normal rate, regular rhythm and normal heart sounds.   Pulmonary/Chest: Breath sounds normal. No respiratory distress. He has no wheezes. He has no rales. He exhibits no tenderness.  Abdominal: Soft. Bowel sounds are normal. He exhibits no distension. There is no tenderness. There is no rebound and no guarding.  Neurological: He is alert and oriented to person, place, and time.    ED Course  Procedures (including critical care time)   Anion gap is 8.  Na corrected for hyperglycemia is 142.  Labs Reviewed  GLUCOSE, CAPILLARY - Abnormal; Notable for the following:    Glucose-Capillary 508 (*)    All other components within  normal limits  URINALYSIS, ROUTINE W REFLEX MICROSCOPIC - Abnormal; Notable for the following:    Specific Gravity, Urine 1.032 (*)    Glucose, UA >1000 (*)    Ketones, ur 15 (*)    All other components within normal limits  POCT I-STAT, CHEM 8 - Abnormal; Notable for the following:    Sodium 132 (*)    BUN 24 (*)    Glucose, Bld 695 (*)    All other components within normal limits  GLUCOSE, CAPILLARY - Abnormal; Notable for the following:    Glucose-Capillary 520  (*)    All other components within normal limits  GLUCOSE, CAPILLARY - Abnormal; Notable for the following:    Glucose-Capillary 265 (*)    All other components within normal limits  GLUCOSE, CAPILLARY - Abnormal; Notable for the following:    Glucose-Capillary 241 (*)    All other components within normal limits  CBC  DIFFERENTIAL  URINE MICROSCOPIC-ADD ON   Patient states he is feeling much better.  Discussed dental follow up within 48 hours.  Pt verbalizes understanding.  Discussed reasons for immediate return and importance of blood sugar control, PCP follow up.  Pt verbalizes understanding.     No results found.   1. Hyperglycemia   2. Dental caries   3. Pain, dental       MDM  Patient presents to ED for hyperglycemia and right lower dental pain.  Patient has poorly controlled blood sugars but has insulin and glucose monitoring equipment at home.  Case manager met with patient and discussed PCP follow up and gave resources.  Pt encouraged to monitor and control his blood sugars better at home, given resources for dental follow up as well.  Pain relieved and blood glucose improved with in ED.            Dillard Cannon [place], [place] 08/27/11 [phone]

## 2011-08-28 NOTE — ED Provider Notes (Signed)
Medical screening examination/treatment/procedure(s) were performed by non-physician practitioner and as supervising physician I was immediately available for consultation/collaboration.   Suzi Roots, MD 08/28/11 [phone]

## 2012-06-10 ENCOUNTER — Emergency Department (HOSPITAL_COMMUNITY): Payer: Self-pay

## 2012-06-10 ENCOUNTER — Encounter (HOSPITAL_COMMUNITY): Payer: Self-pay | Admitting: *Deleted

## 2012-06-10 ENCOUNTER — Emergency Department (HOSPITAL_COMMUNITY)
Admission: EM | Admit: 2012-06-10 | Discharge: 2012-06-10 | Disposition: A | Discharge status: Home / Self Care | Payer: Self-pay | Attending: Emergency Medicine | Admitting: Emergency Medicine

## 2012-06-10 DIAGNOSIS — Z794 Long term (current) use of insulin: Secondary | ICD-10-CM | POA: Insufficient documentation

## 2012-06-10 DIAGNOSIS — R071 Chest pain on breathing: Secondary | ICD-10-CM | POA: Insufficient documentation

## 2012-06-10 DIAGNOSIS — R0789 Other chest pain: Secondary | ICD-10-CM

## 2012-06-10 DIAGNOSIS — E119 Type 2 diabetes mellitus without complications: Secondary | ICD-10-CM | POA: Insufficient documentation

## 2012-06-10 LAB — CBC WITH DIFFERENTIAL/PLATELET
Eosinophils Absolute: 0.1 10*3/uL (ref 0.0–0.7)
Eosinophils Relative: 2 % (ref 0–5)
HCT: 41.1 % (ref 39.0–52.0)
Hemoglobin: 14.1 g/dL (ref 13.0–17.0)
Lymphs Abs: 1.5 10*3/uL (ref 0.7–4.0)
MCH: 28.8 pg (ref 26.0–34.0)
MCV: 83.9 fL (ref 78.0–100.0)
Monocytes Relative: 8 % (ref 3–12)
RBC: 4.9 MIL/uL (ref 4.22–5.81)

## 2012-06-10 LAB — BASIC METABOLIC PANEL
BUN: 16 mg/dL (ref 6–23)
Calcium: 9.4 mg/dL (ref 8.4–10.5)
GFR calc non Af Amer: 68 mL/min — ABNORMAL LOW (ref >90)
Glucose, Bld: 343 mg/dL — ABNORMAL HIGH (ref 70–99)

## 2012-06-10 LAB — GLUCOSE, CAPILLARY: Glucose-Capillary: 195 mg/dL — ABNORMAL HIGH (ref 70–99)

## 2012-06-10 MED ORDER — OXYCODONE-ACETAMINOPHEN 5-325 MG PO TABS: ORAL_TABLET | ORAL | Status: DC

## 2012-06-10 MED ORDER — METHOCARBAMOL 100 MG/ML IJ SOLN
1000.0000 mg | Freq: Once | INTRAVENOUS | Status: AC
  Administered 2012-06-10: 1000 mg via INTRAVENOUS
  Filled 2012-06-10: qty 10

## 2012-06-10 MED ORDER — SODIUM CHLORIDE 0.9 % IV BOLUS (SEPSIS)
1000.0000 mL | Freq: Once | INTRAVENOUS | Status: AC
  Administered 2012-06-10: 1000 mL via INTRAVENOUS

## 2012-06-10 MED ORDER — ASPIRIN 81 MG PO CHEW
324.0000 mg | CHEWABLE_TABLET | Freq: Once | ORAL | Status: AC
  Administered 2012-06-10: 324 mg via ORAL
  Filled 2012-06-10: qty 4

## 2012-06-10 MED ORDER — METHOCARBAMOL 100 MG/ML IJ SOLN: 1000.0000 mg | Freq: Once | INTRAMUSCULAR | Status: DC

## 2012-06-10 NOTE — ED Provider Notes (Signed)
History     CSN: [id number]  Arrival date & time 06/10/12  1134   First MD Initiated Contact with Patient 06/10/12 1353      Chief Complaint  Patient presents with  . Chest Pain    (Consider location/radiation/quality/duration/timing/severity/associated sxs/prior treatment) HPI  NICKLOUS ABURTO is a 41 y.o. male c/o right sided chest pain, described as sharp, rated at 8/10 at worst, 3/10 now. 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 Pain has been constant, non-exertional and exacerbated by movement and deep breathing.   Denies SOB, N/V, diaphoresis, cough, fever, cough, back pain, syncope, prior episodes. Denies recent travel, leg swelling, hemoptysis.  Pt has not received any ASA or NTG in the last 24 hours. Took tylenol with no relief.   RF:  DM, active smoker   Cath: Never Last Stress test: Never Cardiologost: None PCP: None  Past Medical History  Diagnosis Date  . Diabetes mellitus     History reviewed. No pertinent past surgical history.  No family history on file.  History  Substance Use Topics  . Smoking status: Current Every Day Smoker -- 1.0 packs/day  . Smokeless tobacco: Not on file  . Alcohol Use: Yes     12 pk on w/e      Review of Systems  Constitutional: Negative for fever.  Respiratory: Negative for shortness of breath.   Cardiovascular: Positive for chest pain.  Gastrointestinal: Negative for nausea, vomiting, abdominal pain and diarrhea.  All other systems reviewed and are negative.    Allergies  Review of patient's allergies indicates no known allergies.  Home Medications   Current Outpatient Rx  Name Route Sig Dispense Refill  . INSULIN ISOPHANE & REGULAR (70-30) 100 UNIT/ML [place] SUSP Subcutaneous Inject 30 Units into the skin 2 (two) times daily. PT GETS THIS OVER THE COUNTER AT [facility]    . INSULIN REGULAR HUMAN 100 UNIT/ML IJ SOLN Subcutaneous Inject 2-6 Units into the skin 2 (two) times daily  before a meal. PT GETS THIS OTC AT [facility]      BP 118/61  Pulse 86  Temp 98.2 F (36.8 C) (Oral)  Resp 16  SpO2 99%  Physical Exam  Nursing note and vitals reviewed. Constitutional: He is oriented to person, place, and time. He appears well-developed and well-nourished. No distress.  HENT:  Head: Normocephalic.  Mouth/Throat: Oropharynx is clear and moist.  Eyes: Conjunctivae normal and EOM are normal. Pupils are equal, round, and reactive to light.  Cardiovascular: Normal rate.   Pulmonary/Chest: Effort normal and breath sounds normal. No stridor. No respiratory distress. He has no wheezes. He has no rales. He exhibits tenderness.       Pain is reproducible to palpation of right chest  Abdominal: Soft. Bowel sounds are normal. He exhibits no distension and no mass. There is no tenderness. There is no rebound and no guarding.  Musculoskeletal: Normal range of motion.  Neurological: He is alert and oriented to person, place, and time.  Skin: Skin is warm.  Psychiatric: He has a normal mood and affect.    ED Course  Procedures (including critical care time)  Labs Reviewed  CBC WITH DIFFERENTIAL - Abnormal; Notable for the following:    WBC 3.6 (*)     All other components within normal limits  BASIC METABOLIC PANEL - Abnormal; Notable for the following:    Glucose, Bld 343 (*)     GFR calc non Af Amer 68 (*)  GFR calc Af Amer 78 (*)     All other components within normal limits  GLUCOSE, CAPILLARY - Abnormal; Notable for the following:    Glucose-Capillary 195 (*)     All other components within normal limits  POCT I-STAT TROPONIN I   Dg Chest 2 View  06/10/2012  *RADIOLOGY REPORT*  Clinical Data: Right chest pain with shortness of breath since yesterday.  Smoker.  CHEST - 2 VIEW  Comparison: 05/23/2010.  Findings: The heart size and mediastinal contours are normal. The lungs are clear. There is no pleural effusion or pneumothorax. No acute osseous findings  are identified.  IMPRESSION: Stable examination.  No active cardiopulmonary process.   Original Report Authenticated By: Gerrianne Scale, M.D.     Date: 06/10/2012  Rate: 82  Rhythm: normal sinus rhythm  QRS Axis: normal  Intervals: normal  ST/T Wave abnormalities: normal  Conduction Disutrbances:none  Narrative Interpretation: LVH  Old EKG Reviewed: none available   1. Chest wall pain       MDM  History of present illness is atypical for cardiac chest pain. This is very likely musculoskeletal in nature as it is positional, caused by rigorous activity, not exacerbated by exertion, and exacerbated position and palpation.   EKG is normal except for LVH, troponin is negative, chest x-ray is clear, patient's blood work is unremarkable except for an elevated glucose of 343. I will hydrate the patient and control pain with Robaxin IV.  H&H sugar reduces to 195 after fluid bolus. He is stable for discharge.  Pt verbalized understanding and agrees with care plan. Outpatient follow-up and return precautions given.    New Prescriptions   OXYCODONE-ACETAMINOPHEN (PERCOCET/ROXICET) 5-325 MG PER TABLET    1 to 2 tabs PO q6hrs  PRN for pain            Wynetta Emery, PA-C 06/10/12 1554

## 2012-06-10 NOTE — ED Provider Notes (Signed)
Medical screening examination/treatment/procedure(s) were performed by non-physician practitioner and as supervising physician I was immediately available for consultation/collaboration.   Carleene Cooper III, MD 06/10/12 2101

## 2012-06-10 NOTE — ED Notes (Signed)
Patient states sharp pain in chest intermittantly x 1 day, patient denies sob, n/v,

## 2012-06-28 ENCOUNTER — Encounter (HOSPITAL_COMMUNITY): Payer: Self-pay | Admitting: Emergency Medicine

## 2012-06-28 ENCOUNTER — Emergency Department (HOSPITAL_COMMUNITY)
Admission: EM | Admit: 2012-06-28 | Discharge: 2012-06-28 | Disposition: A | Discharge status: Home / Self Care | Payer: Self-pay | Attending: Emergency Medicine | Admitting: Emergency Medicine

## 2012-06-28 DIAGNOSIS — E119 Type 2 diabetes mellitus without complications: Secondary | ICD-10-CM | POA: Insufficient documentation

## 2012-06-28 DIAGNOSIS — F17200 Nicotine dependence, unspecified, uncomplicated: Secondary | ICD-10-CM | POA: Insufficient documentation

## 2012-06-28 DIAGNOSIS — K0889 Other specified disorders of teeth and supporting structures: Secondary | ICD-10-CM

## 2012-06-28 DIAGNOSIS — Z794 Long term (current) use of insulin: Secondary | ICD-10-CM | POA: Insufficient documentation

## 2012-06-28 DIAGNOSIS — K089 Disorder of teeth and supporting structures, unspecified: Secondary | ICD-10-CM | POA: Insufficient documentation

## 2012-06-28 MED ORDER — PENICILLIN V POTASSIUM 500 MG PO TABS: 500.0000 mg | ORAL_TABLET | Freq: Three times a day (TID) | ORAL | Status: DC

## 2012-06-28 MED ORDER — OXYCODONE-ACETAMINOPHEN 7.5-325 MG PO TABS: 1.0000 | ORAL_TABLET | ORAL | Status: DC | PRN

## 2012-06-28 MED ORDER — IBUPROFEN 600 MG PO TABS: 600.0000 mg | ORAL_TABLET | Freq: Four times a day (QID) | ORAL | Status: DC | PRN

## 2012-06-28 MED ORDER — OXYCODONE-ACETAMINOPHEN 5-325 MG PO TABS
2.0000 | ORAL_TABLET | Freq: Once | ORAL | Status: AC
  Administered 2012-06-28: 2 via ORAL
  Filled 2012-06-28: qty 2

## 2012-06-28 NOTE — ED Notes (Signed)
RN notified of CBG 285

## 2012-06-28 NOTE — ED Provider Notes (Signed)
History     CSN: [id number]  Arrival date & time 06/28/12  4098   First MD Initiated Contact with Patient 06/28/12 [phone]      Chief Complaint  Patient presents with  . Dental Pain     HPI Pt stated that he has been having tooth pain to the right lower side and stated that the pain is on the whole right side of his face. Pt also stated that he thinks it might be messing his blood sugars up because he "isn't feeling right" and yesterday he thinks his blood sugar dropped yesterday.  Past Medical History  Diagnosis Date  . Diabetes mellitus     History reviewed. No pertinent past surgical history.  No family history on file.  History  Substance Use Topics  . Smoking status: Current Every Day Smoker -- 1.0 packs/day  . Smokeless tobacco: Not on file  . Alcohol Use: Yes     12 pk on w/e      Review of Systems All other systems reviewed and are negative Allergies  Review of patient's allergies indicates no known allergies.  Home Medications   Current Outpatient Rx  Name Route Sig Dispense Refill  . IBUPROFEN 600 MG PO TABS Oral Take 1 tablet (600 mg total) by mouth every 6 (six) hours as needed for pain. 30 tablet 0  . INSULIN ISOPHANE & REGULAR (70-30) 100 UNIT/ML [place] SUSP Subcutaneous Inject 30 Units into the skin 2 (two) times daily. PT GETS THIS OVER THE COUNTER AT [facility]    . INSULIN REGULAR HUMAN 100 UNIT/ML IJ SOLN Subcutaneous Inject 2-6 Units into the skin 2 (two) times daily before a meal. PT GETS THIS OTC AT [facility]    . OXYCODONE-ACETAMINOPHEN 7.5-325 MG PO TABS Oral Take 1 tablet by mouth every 4 (four) hours as needed for pain. 12 tablet 0  . OXYCODONE-ACETAMINOPHEN 5-325 MG PO TABS  1 to 2 tabs PO q6hrs  PRN for pain 15 tablet 0  . PENICILLIN V POTASSIUM 500 MG PO TABS Oral Take 1 tablet (500 mg total) by mouth 3 (three) times daily. 30 tablet 0    BP 150/88  Pulse 80  Temp 97.6 F (36.4 C) (Oral)  Resp 20  SpO2 98%  Physical  Exam  Nursing note and vitals reviewed. Constitutional: He is oriented to person, place, and time. He appears well-developed and well-nourished. No distress.  HENT:  Head: Normocephalic and atraumatic. No trismus in the jaw.  Mouth/Throat: Oropharynx is clear and moist. No oral lesions. Dental caries present. No dental abscesses or uvula swelling.    Eyes: Pupils are equal, round, and reactive to light.  Neck: Normal range of motion.  Cardiovascular: Normal rate and intact distal pulses.   Pulmonary/Chest: No respiratory distress.  Abdominal: Normal appearance. He exhibits no distension.  Musculoskeletal: Normal range of motion.  Neurological: He is alert and oriented to person, place, and time. No cranial nerve deficit.  Skin: Skin is warm and dry. No rash noted.  Psychiatric: He has a normal mood and affect. His behavior is normal.    ED Course  Procedures (including critical care time)  Labs Reviewed - No data to display No results found.   1. Pain, dental       MDM          Nelia Shi, MD 06/28/12 [phone]

## 2012-06-28 NOTE — ED Notes (Signed)
Pt stated that he has been having tooth pain to the right lower side and stated that the pain is on the whole right side of his face. Pt also stated that he thinks it might be messing his blood sugars up because he "isn't feeling right" and yesterday he thinks his blood sugar dropped yesterday.

## 2012-06-29 ENCOUNTER — Encounter (HOSPITAL_COMMUNITY): Payer: Self-pay | Admitting: *Deleted

## 2012-06-29 ENCOUNTER — Emergency Department (HOSPITAL_COMMUNITY)
Admission: EM | Admit: 2012-06-29 | Discharge: 2012-06-29 | Disposition: A | Discharge status: Home / Self Care | Payer: Self-pay | Attending: Emergency Medicine | Admitting: Emergency Medicine

## 2012-06-29 DIAGNOSIS — K089 Disorder of teeth and supporting structures, unspecified: Secondary | ICD-10-CM | POA: Insufficient documentation

## 2012-06-29 DIAGNOSIS — Z794 Long term (current) use of insulin: Secondary | ICD-10-CM | POA: Insufficient documentation

## 2012-06-29 DIAGNOSIS — F17200 Nicotine dependence, unspecified, uncomplicated: Secondary | ICD-10-CM | POA: Insufficient documentation

## 2012-06-29 DIAGNOSIS — E119 Type 2 diabetes mellitus without complications: Secondary | ICD-10-CM | POA: Insufficient documentation

## 2012-06-29 DIAGNOSIS — K0889 Other specified disorders of teeth and supporting structures: Secondary | ICD-10-CM

## 2012-06-29 DIAGNOSIS — R112 Nausea with vomiting, unspecified: Secondary | ICD-10-CM | POA: Insufficient documentation

## 2012-06-29 DIAGNOSIS — Z79899 Other long term (current) drug therapy: Secondary | ICD-10-CM | POA: Insufficient documentation

## 2012-06-29 DIAGNOSIS — R109 Unspecified abdominal pain: Secondary | ICD-10-CM | POA: Insufficient documentation

## 2012-06-29 LAB — URINALYSIS, ROUTINE W REFLEX MICROSCOPIC
Glucose, UA: >1000 mg/dL — AB
Leukocytes, UA: NEGATIVE
pH: 5.5 (ref 5.0–8.0)

## 2012-06-29 LAB — COMPREHENSIVE METABOLIC PANEL
AST: 21 U/L (ref 0–37)
Albumin: 4.2 g/dL (ref 3.5–5.2)
Calcium: 9.9 mg/dL (ref 8.4–10.5)
Creatinine, Ser: 1.05 mg/dL (ref 0.50–1.35)
Total Protein: 7.7 g/dL (ref 6.0–8.3)

## 2012-06-29 LAB — CBC WITH DIFFERENTIAL/PLATELET
Basophils Absolute: 0 10*3/uL (ref 0.0–0.1)
Basophils Relative: 0 % (ref 0–1)
Eosinophils Relative: 0 % (ref 0–5)
HCT: 43.3 % (ref 39.0–52.0)
MCHC: 33 g/dL (ref 30.0–36.0)
Monocytes Absolute: 0.1 10*3/uL (ref 0.1–1.0)
Neutro Abs: 8 10*3/uL — ABNORMAL HIGH (ref 1.7–7.7)
RDW: 13.9 % (ref 11.5–15.5)

## 2012-06-29 LAB — BLOOD GAS, VENOUS
Acid-base deficit: 2.1 mmol/L — ABNORMAL HIGH (ref 0.0–2.0)
Drawn by: 22937
FIO2: 0.21 %
pCO2, Ven: 43 mmHg — ABNORMAL LOW (ref 45.0–50.0)
pH, Ven: 7.349 — ABNORMAL HIGH (ref 7.250–7.300)

## 2012-06-29 LAB — GLUCOSE, CAPILLARY: Glucose-Capillary: 281 mg/dL — ABNORMAL HIGH (ref 70–99)

## 2012-06-29 LAB — URINE MICROSCOPIC-ADD ON

## 2012-06-29 MED ORDER — PROMETHAZINE HCL 25 MG PO TABS: 25.0000 mg | ORAL_TABLET | Freq: Four times a day (QID) | ORAL | Status: DC | PRN

## 2012-06-29 MED ORDER — SODIUM CHLORIDE 0.9 % IV SOLN
1000.0000 mL | Freq: Once | INTRAVENOUS | Status: AC
  Administered 2012-06-29: 1000 mL via INTRAVENOUS

## 2012-06-29 MED ORDER — ONDANSETRON HCL 4 MG/2ML IJ SOLN
4.0000 mg | Freq: Once | INTRAMUSCULAR | Status: AC
  Administered 2012-06-29: 4 mg via INTRAVENOUS
  Filled 2012-06-29: qty 2

## 2012-06-29 MED ORDER — SODIUM CHLORIDE 0.9 % IV BOLUS (SEPSIS)
1000.0000 mL | Freq: Once | INTRAVENOUS | Status: DC
Start: 2012-06-29 — End: 2012-06-30

## 2012-06-29 MED ORDER — SODIUM CHLORIDE 0.9 % IV SOLN
1000.0000 mL | INTRAVENOUS | Status: DC
  Administered 2012-06-29: 1000 mL via INTRAVENOUS

## 2012-06-29 MED ORDER — HYDROMORPHONE HCL PF 1 MG/ML IJ SOLN
1.0000 mg | Freq: Once | INTRAMUSCULAR | Status: AC
  Administered 2012-06-29: 1 mg via INTRAVENOUS
  Filled 2012-06-29: qty 1

## 2012-06-29 MED ORDER — OXYCODONE-ACETAMINOPHEN 5-325 MG PO TABS: 1.0000 | ORAL_TABLET | ORAL | Status: DC | PRN

## 2012-06-29 NOTE — ED Provider Notes (Signed)
History     CSN: [id number]  Arrival date & time 06/29/12  1727   First MD Initiated Contact with Patient 06/29/12 1851      Chief Complaint  Patient presents with  . Nausea  . Emesis  . Abdominal Pain  . Dental Pain     The history is provided by the patient.   the patient reports dental pain for several months.  Using emergency [facility] yesterday and sent home with a prescription for pain medicine and antibiotics.  He followed up with the dentist today to have his teeth extracted but the dentist when extracted tooth because the patient has been vomiting all day.  He reports nausea and vomiting without significant abdominal pain.  He denies diarrhea.  He has no fevers or chills.  No recent sick contacts.  He denies melena or hematochezia.  He states his tooth is still bother him but it does help when he takes the Percocet.  His symptoms are mild to moderate in severity.    Past Medical History  Diagnosis Date  . Diabetes mellitus     History reviewed. No pertinent past surgical history.  History reviewed. No pertinent family history.  History  Substance Use Topics  . Smoking status: Current Every Day Smoker -- 1.0 packs/day  . Smokeless tobacco: Not on file  . Alcohol Use: Yes     12 pk on w/e      Review of Systems  Gastrointestinal: Positive for vomiting and abdominal pain.  All other systems reviewed and are negative.    Allergies  Review of patient's allergies indicates no known allergies.  Home Medications   Current Outpatient Rx  Name Route Sig Dispense Refill  . ACETAMINOPHEN 500 MG PO TABS Oral Take 1,000 mg by mouth every 6 (six) hours as needed. For pain    . HYDROCORTISONE 1 % EX CREA Topical Apply 1 application topically 2 (two) times daily.    . IBUPROFEN 200 MG PO TABS Oral Take 400 mg by mouth every 6 (six) hours as needed. For pain    . IBUPROFEN 600 MG PO TABS Oral Take 1 tablet (600 mg total) by mouth every 6 (six) hours as needed for pain.  30 tablet 0  . INSULIN ISOPHANE & REGULAR (70-30) 100 UNIT/ML [place] SUSP Subcutaneous Inject 30 Units into the skin 2 (two) times daily.     . INSULIN REGULAR HUMAN 100 UNIT/ML IJ SOLN Subcutaneous Inject 2-6 Units into the skin 2 (two) times daily before a meal. 160-200 = 2 units 201-300=4 units 300 or above= 6 units    . ADULT MULTIVITAMIN W/MINERALS CH Oral Take 1 tablet by mouth daily.    . OXYCODONE-ACETAMINOPHEN 7.5-325 MG PO TABS Oral Take 1 tablet by mouth every 4 (four) hours as needed for pain. 12 tablet 0  . PENICILLIN V POTASSIUM 500 MG PO TABS Oral Take 500 mg by mouth 3 (three) times daily.    . OXYCODONE-ACETAMINOPHEN 5-325 MG PO TABS Oral Take 1 tablet by mouth every 4 (four) hours as needed for pain. 20 tablet 0  . PROMETHAZINE HCL 25 MG PO TABS Oral Take 1 tablet (25 mg total) by mouth every 6 (six) hours as needed for nausea. 15 tablet 0    BP 139/77  Pulse 77  Temp 98.7 F (37.1 C) (Oral)  Resp 20  SpO2 95%  Physical Exam  Nursing note and vitals reviewed. Constitutional: He is oriented to person, place, and time. He appears well-developed and  well-nourished.  HENT:  Head: Normocephalic and atraumatic.       Patient with evidence of dental decay and focal tenderness of his right lower second molar.  He has no gingival swelling or fluctuance.  Tolerating secretions.  Oral airway patent.  Mucous membranes are dry  Eyes: EOM are normal.  Neck: Normal range of motion.  Cardiovascular: Normal rate, regular rhythm, normal heart sounds and intact distal pulses.   Pulmonary/Chest: Effort normal and breath sounds normal. No respiratory distress.  Abdominal: Soft. He exhibits no distension. There is no tenderness. There is no rebound and no guarding.  Musculoskeletal: Normal range of motion.  Neurological: He is alert and oriented to person, place, and time.  Skin: Skin is warm and dry.  Psychiatric: He has a normal mood and affect. Judgment normal.    ED Course    Procedures (including critical care time)  Labs Reviewed  GLUCOSE, CAPILLARY - Abnormal; Notable for the following:    Glucose-Capillary 281 (*)     All other components within normal limits  CBC WITH DIFFERENTIAL - Abnormal; Notable for the following:    Neutrophils Relative 87 (*)     Neutro Abs 8.0 (*)     Monocytes Relative 1 (*)     All other components within normal limits  COMPREHENSIVE METABOLIC PANEL - Abnormal; Notable for the following:    Glucose, Bld 263 (*)     GFR calc non Af Amer 86 (*)     All other components within normal limits  LIPASE, BLOOD - Abnormal; Notable for the following:    Lipase 78 (*)     All other components within normal limits  URINALYSIS, ROUTINE W REFLEX MICROSCOPIC - Abnormal; Notable for the following:    APPearance CLOUDY (*)     Specific Gravity, Urine 1.031 (*)     Glucose, UA >1000 (*)     Hgb urine dipstick TRACE (*)     Ketones, ur >80 (*)     All other components within normal limits  BLOOD GAS, VENOUS - Abnormal; Notable for the following:    pH, Ven 7.349 (*)     pCO2, Ven 43.0 (*)     pO2, Ven 55.5 (*)     Acid-base deficit 2.1 (*)     All other components within normal limits  URINE MICROSCOPIC-ADD ON   No results found.   1. Nausea and vomiting   2. Pain, dental   3. Diabetes       MDM  The patient feels much better after IV fluids and antinausea medicine.  This does not appear to be DKA.  His a mild elevation in his lipase with normal LFTs.  He has no significant tenderness in his epigastric region.  I doubt this is pancreatitis.  Discharge home in good condition.  The patient is scheduled followup with his dentist on Friday for dental extraction.  Home with pain medicine and nausea medicine.        Lyanne Co, MD 06/29/12 [phone]

## 2012-06-29 NOTE — ED Notes (Signed)
Pt rolling around on triage stretcher. Pt c/o tooth ache, n/v and abd pain that started today. Pt states "my stomach hurts and I've been throwing up all day."

## 2012-06-29 NOTE — ED Notes (Addendum)
Pt sleep when entering room and fell asleep during assessment. Pt brought in prescription meds, Percocet was filled yesterday with a quantity of 12 only 5 left as of now.

## 2012-06-29 NOTE — ED Notes (Signed)
Per EMS pt complained of toothache x couple months, went to ED last night, got antibiotic, CBG 283, pt also complaining of n/v but took oxycodone on empty stomach, started taking antibiotic and pain medication today. Pt complaining of lower bottom R jaw pain. BP 128/71, HR 78, RR 18, Oxygen 100%, pain 10/10.

## 2012-11-12 ENCOUNTER — Emergency Department (HOSPITAL_COMMUNITY): Payer: Self-pay

## 2012-11-12 ENCOUNTER — Encounter (HOSPITAL_COMMUNITY): Payer: Self-pay | Admitting: *Deleted

## 2012-11-12 ENCOUNTER — Emergency Department (HOSPITAL_COMMUNITY)
Admission: EM | Admit: 2012-11-12 | Discharge: 2012-11-12 | Disposition: A | Discharge status: Home / Self Care | Payer: Self-pay | Attending: Emergency Medicine | Admitting: Emergency Medicine

## 2012-11-12 DIAGNOSIS — IMO0001 Reserved for inherently not codable concepts without codable children: Secondary | ICD-10-CM | POA: Insufficient documentation

## 2012-11-12 DIAGNOSIS — M2550 Pain in unspecified joint: Secondary | ICD-10-CM | POA: Insufficient documentation

## 2012-11-12 DIAGNOSIS — S0011XA Contusion of right eyelid and periocular area, initial encounter: Secondary | ICD-10-CM

## 2012-11-12 DIAGNOSIS — E119 Type 2 diabetes mellitus without complications: Secondary | ICD-10-CM | POA: Insufficient documentation

## 2012-11-12 DIAGNOSIS — Z794 Long term (current) use of insulin: Secondary | ICD-10-CM | POA: Insufficient documentation

## 2012-11-12 DIAGNOSIS — IMO0002 Reserved for concepts with insufficient information to code with codable children: Secondary | ICD-10-CM | POA: Insufficient documentation

## 2012-11-12 DIAGNOSIS — F17200 Nicotine dependence, unspecified, uncomplicated: Secondary | ICD-10-CM | POA: Insufficient documentation

## 2012-11-12 DIAGNOSIS — M791 Myalgia, unspecified site: Secondary | ICD-10-CM

## 2012-11-12 DIAGNOSIS — S0010XA Contusion of unspecified eyelid and periocular area, initial encounter: Secondary | ICD-10-CM | POA: Insufficient documentation

## 2012-11-12 DIAGNOSIS — H1131 Conjunctival hemorrhage, right eye: Secondary | ICD-10-CM

## 2012-11-12 DIAGNOSIS — H1130 Conjunctival hemorrhage, unspecified eye: Secondary | ICD-10-CM | POA: Insufficient documentation

## 2012-11-12 LAB — GLUCOSE, CAPILLARY: Glucose-Capillary: 124 mg/dL — ABNORMAL HIGH (ref 70–99)

## 2012-11-12 MED ORDER — FLUORESCEIN SODIUM 1 MG OP STRP
1.0000 | ORAL_STRIP | Freq: Once | OPHTHALMIC | Status: AC
  Administered 2012-11-12: 1 via OPHTHALMIC
  Filled 2012-11-12: qty 1

## 2012-11-12 MED ORDER — ONDANSETRON HCL 4 MG/2ML IJ SOLN
4.0000 mg | Freq: Once | INTRAMUSCULAR | Status: AC
  Administered 2012-11-12: 4 mg via INTRAVENOUS
  Filled 2012-11-12: qty 2

## 2012-11-12 MED ORDER — TETRACAINE HCL 0.5 % OP SOLN
2.0000 [drp] | Freq: Once | OPHTHALMIC | Status: AC
  Administered 2012-11-12: 2 [drp] via OPHTHALMIC
  Filled 2012-11-12: qty 2

## 2012-11-12 MED ORDER — OXYCODONE-ACETAMINOPHEN 5-325 MG PO TABS: ORAL_TABLET | ORAL | Status: DC

## 2012-11-12 MED ORDER — HYDROMORPHONE HCL PF 1 MG/ML IJ SOLN
0.5000 mg | Freq: Once | INTRAMUSCULAR | Status: AC
  Administered 2012-11-12: 0.5 mg via INTRAVENOUS
  Filled 2012-11-12: qty 1
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
## 2012-11-12 NOTE — ED Notes (Signed)
AIDET performed. 

## 2012-11-13 NOTE — ED Provider Notes (Signed)
Medical screening examination/treatment/procedure(s) were performed by non-physician practitioner and as supervising physician I was immediately available for consultation/collaboration.  Doug Sou, MD 11/13/12 [id number]

## 2013-04-18 ENCOUNTER — Encounter (HOSPITAL_COMMUNITY): Payer: Self-pay | Admitting: Emergency Medicine

## 2013-04-18 ENCOUNTER — Emergency Department (HOSPITAL_COMMUNITY)
Admission: EM | Admit: 2013-04-18 | Discharge: 2013-04-18 | Disposition: A | Discharge status: Home / Self Care | Payer: Self-pay | Attending: Emergency Medicine | Admitting: Emergency Medicine

## 2013-04-18 DIAGNOSIS — R51 Headache: Secondary | ICD-10-CM | POA: Insufficient documentation

## 2013-04-18 DIAGNOSIS — Z794 Long term (current) use of insulin: Secondary | ICD-10-CM | POA: Insufficient documentation

## 2013-04-18 DIAGNOSIS — F17200 Nicotine dependence, unspecified, uncomplicated: Secondary | ICD-10-CM | POA: Insufficient documentation

## 2013-04-18 DIAGNOSIS — E119 Type 2 diabetes mellitus without complications: Secondary | ICD-10-CM | POA: Insufficient documentation

## 2013-04-18 MED ORDER — DEXAMETHASONE SODIUM PHOSPHATE 10 MG/ML IJ SOLN
10.0000 mg | Freq: Once | INTRAMUSCULAR | Status: AC
  Administered 2013-04-18: 10 mg via INTRAMUSCULAR
  Filled 2013-04-18: qty 1

## 2013-04-18 MED ORDER — KETOROLAC TROMETHAMINE 60 MG/2ML IM SOLN
60.0000 mg | Freq: Once | INTRAMUSCULAR | Status: AC
  Administered 2013-04-18: 60 mg via INTRAMUSCULAR
  Filled 2013-04-18: qty 2

## 2013-04-18 MED ORDER — METOCLOPRAMIDE HCL 10 MG PO TABS
10.0000 mg | ORAL_TABLET | Freq: Once | ORAL | Status: AC
  Administered 2013-04-18: 10 mg via ORAL
  Filled 2013-04-18: qty 1

## 2013-04-18 MED ORDER — DIPHENHYDRAMINE HCL 25 MG PO CAPS
50.0000 mg | ORAL_CAPSULE | Freq: Once | ORAL | Status: AC
  Administered 2013-04-18: 50 mg via ORAL
  Filled 2013-04-18: qty 2
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
## 2013-04-18 NOTE — ED Notes (Signed)
Patient states that starting last week when ever he is anxious or upset he has had a 10/ m10 HA that causes him to cry

## 2013-04-19 NOTE — ED Provider Notes (Signed)
Medical screening examination/treatment/procedure(s) were performed by non-physician practitioner and as supervising physician I was immediately available for consultation/collaboration.    Hesper Venturella R Todd Jelinski, MD 04/19/13 1541 

## 2013-08-23 ENCOUNTER — Encounter (HOSPITAL_COMMUNITY): Payer: Self-pay | Admitting: Emergency Medicine

## 2013-08-23 ENCOUNTER — Emergency Department (HOSPITAL_COMMUNITY): Payer: Self-pay

## 2013-08-23 ENCOUNTER — Emergency Department (HOSPITAL_COMMUNITY)
Admission: EM | Admit: 2013-08-23 | Discharge: 2013-08-23 | Disposition: A | Discharge status: Home / Self Care | Payer: Self-pay | Attending: Emergency Medicine | Admitting: Emergency Medicine

## 2013-08-23 DIAGNOSIS — M25512 Pain in left shoulder: Secondary | ICD-10-CM

## 2013-08-23 DIAGNOSIS — Z794 Long term (current) use of insulin: Secondary | ICD-10-CM | POA: Insufficient documentation

## 2013-08-23 DIAGNOSIS — E119 Type 2 diabetes mellitus without complications: Secondary | ICD-10-CM | POA: Insufficient documentation

## 2013-08-23 DIAGNOSIS — F17200 Nicotine dependence, unspecified, uncomplicated: Secondary | ICD-10-CM | POA: Insufficient documentation

## 2013-08-23 DIAGNOSIS — R739 Hyperglycemia, unspecified: Secondary | ICD-10-CM

## 2013-08-23 DIAGNOSIS — M25519 Pain in unspecified shoulder: Secondary | ICD-10-CM | POA: Insufficient documentation

## 2013-08-23 LAB — GLUCOSE, CAPILLARY: Glucose-Capillary: 208 mg/dL — ABNORMAL HIGH (ref 70–99)

## 2013-08-23 MED ORDER — OXYCODONE-ACETAMINOPHEN 5-325 MG PO TABS
1.0000 | ORAL_TABLET | Freq: Once | ORAL | Status: AC
  Administered 2013-08-23: 1 via ORAL
  Filled 2013-08-23: qty 1

## 2013-08-23 MED ORDER — OXYCODONE-ACETAMINOPHEN 5-325 MG PO TABS
1.0000 | ORAL_TABLET | Freq: Four times a day (QID) | ORAL | Status: DC | PRN
Start: 2013-08-23 — End: 2014-05-13

## 2013-08-23 NOTE — ED Notes (Signed)
Left shoulder pain x 3-4 months; no associated injury; also states wants sugar checked; denies chest pain

## 2013-08-23 NOTE — ED Provider Notes (Signed)
CSN: [id number]     Arrival date & time 08/23/13  4098 History   First MD Initiated Contact with Patient 08/23/13 [phone]     Chief Complaint  Patient presents with  . Shoulder Pain   (Consider location/radiation/quality/duration/timing/severity/associated sxs/prior Treatment) HPI Pt presents with c/o left shoulder pain over the past several months.  Pain worse with certain movements.  Difficult to raise arm over head.  He states there is some constant dull ache but with certain movements pain is sharp.  No known injury. He used to lift weights several years ago and had some left shoulder pain then with heavy weights.  No swelling of left arm.  No chest pain.  Has tried taking ibuprofen and naproxen for pain without much relief.  No fever/chills.  There are no other associated systemic symptoms, there are no other alleviating or modifying factors.   Past Medical History  Diagnosis Date  . Diabetes mellitus    History reviewed. No pertinent past surgical history. No family history on file. History  Substance Use Topics  . Smoking status: Current Every Day Smoker -- 1.00 packs/day  . Smokeless tobacco: Not on file  . Alcohol Use: Yes     Comment: 12 pk on w/e    Review of Systems ROS reviewed and all otherwise negative except for mentioned in HPI  Allergies  Review of patient's allergies indicates no known allergies.  Home Medications   Current Outpatient Rx  Name  Route  Sig  Dispense  Refill  . ibuprofen (ADVIL,MOTRIN) 200 MG tablet   Oral   Take 400 mg by mouth every 6 (six) hours as needed. For pain         . insulin NPH-insulin regular (NOVOLIN 70/30) (70-30) 100 UNIT/ML injection   Subcutaneous   Inject 25 Units into the skin 2 (two) times daily.          . insulin regular (HUMULIN R) 100 units/mL injection   Subcutaneous   Inject 3-5 Units into the skin 2 (two) times daily before a meal. 160-200 = 2 units 201-300=4 units 300 or above= 6 units         .  naproxen sodium (ANAPROX) 220 MG tablet   Oral   Take 440 mg by mouth daily as needed (for pain).         Marland Kitchen oxyCODONE-acetaminophen (PERCOCET/ROXICET) 5-325 MG per tablet   Oral   Take 1-2 tablets by mouth every 6 (six) hours as needed for severe pain.   15 tablet   0    BP 119/65  Pulse 79  Temp(Src) 98.2 F (36.8 C) (Oral)  Resp 17  SpO2 97% Vitals reviewed Physical Exam Physical Examination: General appearance - alert, well appearing, and in no distress Mental status - alert, oriented to person, place, and time Eyes - no scleral icterus, no conjunctival injection Neck - supple, no midline tenderness to palpation Chest - clear to auscultation, no wheezes, rales or rhonchi, symmetric air entry Heart - normal rate, regular rhythm, normal S1, S2, no murmurs, rubs, clicks or gallops Neurological - alert, oriented x 3, strength 5/5 in extremities x 4, sensation intact Musculoskeletal - no joint tenderness, deformity or swelling Extremities - peripheral pulses normal, no pedal edema, no clubbing or cyanosis Skin - normal coloration and turgor, no rashes  ED Course  Procedures (including critical care time) Labs Review Labs Reviewed  GLUCOSE, CAPILLARY - Abnormal; Notable for the following:    Glucose-Capillary 208 (*)    All  other components within normal limits   Imaging Review Dg Shoulder Left  08/23/2013   CLINICAL DATA:  Generalized left shoulder pain without history of trauma  EXAM: LEFT SHOULDER - 2+ VIEW  COMPARISON:  None.  FINDINGS: Three views of the left shoulder reveal the bones to be adequately mineralized. There is no evidence of an acute fracture nor dislocation. There is no lytic nor blastic lesion. The glenohumeral joint space is preserved. The [facility] joint appears intact. The observed portions of the scapula, left clavicle, and upper left ribs appear normal.  IMPRESSION: There is no acute bony abnormality of the left shoulder.   Electronically Signed   By: David   [place]   On: 08/23/2013 07:37    EKG Interpretation    Date/Time:  Tuesday August 23 2013 06:40:27 EST Ventricular Rate:  77 PR Interval:  171 QRS Duration: 84 QT Interval:  377 QTC Calculation: 427 R Axis:   63 Text Interpretation:  Sinus rhythm Left atrial enlargement RSR' in V1 or V2, probably normal variant Left ventricular hypertrophy No significant change since last tracing Confirmed by [facility]  MD, MARTHA [phone]) on 08/23/2013 7:29:32 AM            MDM   1. Shoulder pain, left   2. Hyperglycemia    Pt presenting with c/o left shoulder pain over the past few months.  Pain musculoskeletal in nature, suspect rotator cuff injury- xray normal.  No neurologic findings, pulses equal and no swelling of arm.  No chest pain or findings that are c/w ACS.  Pt given pain medication, advised to continue with anti-inflammatories as well.  Given information for f/u with orthpedics.   Pt also with mild hyperglycemia.  Discharged with strict return precautions.  Pt agreeable with plan.    Ethelda Chick, MD 08/23/13 [phone]

## 2013-09-04 ENCOUNTER — Emergency Department (HOSPITAL_COMMUNITY)
Admission: EM | Admit: 2013-09-04 | Discharge: 2013-09-04 | Disposition: A | Discharge status: Home / Self Care | Payer: Self-pay | Attending: Emergency Medicine | Admitting: Emergency Medicine

## 2013-09-04 ENCOUNTER — Encounter (HOSPITAL_COMMUNITY): Payer: Self-pay | Admitting: Emergency Medicine

## 2013-09-04 DIAGNOSIS — F17200 Nicotine dependence, unspecified, uncomplicated: Secondary | ICD-10-CM | POA: Insufficient documentation

## 2013-09-04 DIAGNOSIS — Z794 Long term (current) use of insulin: Secondary | ICD-10-CM | POA: Insufficient documentation

## 2013-09-04 DIAGNOSIS — M25512 Pain in left shoulder: Secondary | ICD-10-CM

## 2013-09-04 DIAGNOSIS — E119 Type 2 diabetes mellitus without complications: Secondary | ICD-10-CM | POA: Insufficient documentation

## 2013-09-04 DIAGNOSIS — M25519 Pain in unspecified shoulder: Secondary | ICD-10-CM | POA: Insufficient documentation

## 2013-09-04 MED ORDER — NAPROXEN 375 MG PO TABS: 375.0000 mg | ORAL_TABLET | Freq: Two times a day (BID) | ORAL | Status: DC

## 2013-09-04 MED ORDER — NAPROXEN 375 MG PO TABS
375.0000 mg | ORAL_TABLET | Freq: Two times a day (BID) | ORAL | Status: DC
Start: 2013-09-04 — End: 2013-09-04

## 2013-09-04 NOTE — Discharge Instructions (Signed)
Call for a follow up appointment with a Family or Primary Care Provider.  You need to be evaluated by an Orthopedic Surgeon. Return if Symptoms worsen.   Take medication as prescribed.  Ice your shoulder as indicated in the Cryotherapy.  Emergency Department Resource Guide 1) Find a Doctor and Pay Out of Pocket Although you won't have to find out who is covered by your insurance plan, it is a good idea to ask around and get recommendations. You will then need to call the office and see if the doctor you have chosen will accept you as a new patient and what types of options they offer for patients who are self-pay. Some doctors offer discounts or will set up payment plans for their patients who do not have insurance, but you will need to ask so you aren't surprised when you get to your appointment.  2) Contact Your Local Health Department Not all health departments have doctors that can see patients for sick visits, but many do, so it is worth a call to see if yours does. If you don't know where your local health department is, you can check in your phone book. The CDC also has a tool to help you locate your state's health department, and many state websites also have listings of all of their local health departments.  3) Find a Walk-in Clinic If your illness is not likely to be very severe or complicated, you may want to try a walk in clinic. These are popping up all over the country in pharmacies, drugstores, and shopping centers. They're usually staffed by nurse practitioners or physician assistants that have been trained to treat common illnesses and complaints. They're usually fairly quick and inexpensive. However, if you have serious medical issues or chronic medical problems, these are probably not your best option.  No Primary Care Doctor: - Call Health Connect at  [phone] - they can help you locate a primary care doctor that  accepts your insurance, provides certain services,  etc. - Physician Referral Service- [phone]  Chronic Pain Problems: Organization         Address  Phone   Notes  [facility]  [phone] Patients need to be referred by their primary care doctor.   Medication Assistance: Organization         Address  Phone   Notes  [facility] [address] [place], [place] [address] [phone]) [phone] --Must be a resident of [facility] [place] -- Must have NO insurance coverage whatsoever (no Medicaid/ Medicare, etc.) -- The pt. MUST have a primary care doctor that directs their care regularly and follows them in the community   MedAssist  [phone]) [phone]   [employer]  [phone]    Agencies that provide inexpensive medical care: Organization         Address  Phone   Notes  [facility]  [phone]) [phone]   [facility]    [phone]   [facility] [address] [place], [place] [address] [phone]   [facility] [address], [place] [phone]   Planned Parenthood    [phone]   [facility]    [phone]) [phone]   [facility]  [address], [place] Phone:  [phone], Fax:  [phone] Hours of Operation:  9 am - 6 pm, M-F.  Also accepts Medicaid/Medicare  and self-pay.  [facility] for [address] [address], [address], [place] Phone: [phone], Fax: [phone]. Hours of Operation:  8:30 am - 5:30 pm, M-F.  Also accepts Medicaid and self-pay.  [facility] [place] [address], [place] Phone: [phone]   [place], [place], [place] [phone], Ext. [phone] Mondays & Thursdays: 7-9 AM.  First 15 patients are seen on a first come, first serve basis.    [place] Providers:  Organization         Address  Phone   Notes  [facility] [address], [place] [phone] Also accepts self-pay patients.  [facility] [address], [place]  [phone]   [place], [address], [place] [phone]   [facility] [address] [place] [phone]   Lucianne Lei [address], [place]   [phone] Only accepts [place] Access [place] patients after they have their name applied to their card.   Self-Pay (no insurance) in [facility]:  Organization         Address  Phone   Notes  Sickle Cell Patients, [facility] [place] [phone]   [facility] [place] [phone]   [facility] [place]  [place], [place], [place] [phone]   [facility]/Dr. Osei-Bonsu  [address] [place] or [address], [place] [phone] Phone number for both [place] and [place] locations is the same.  [facility] [address] [place] [phone]   [facility] [address] [place] or [address] [phone]   [facility] [place] [address] [place] [phone], phone; [phone], fax Sees patients 1st and 3rd Saturday of every month.  Must not qualify for public or private insurance (i.e. Medicaid, Medicare, [place] Health Choice, Veterans' Benefits)  Household income should be no more than 200% of the poverty level The clinic cannot treat you if you are pregnant or think you are pregnant  Sexually transmitted diseases are not treated at the clinic.    Dental Care: Organization         Address  Phone  Notes  [facility] Department of [facility] [phone] Accepts children up to  age 26 who are enrolled in [place] or [place]; pregnant women with a Medicaid card; and children who have applied for Medicaid or [place] Health Choice, but were declined, whose parents can pay a reduced fee at time of service.  [facility] Department of [facility]  [address], [place] [phone] Accepts children up to age 43 who are enrolled in [place] or [place]; pregnant women with a Medicaid card; and children who have applied for Medicaid or [place] Health Choice, but were declined, whose parents can pay a reduced fee at time of service.  [facility]  [place] [phone] Patients are seen by appointment only. Walk-ins are not accepted. [place] will see patients 55 years of age and older. Monday - Tuesday (8am-5pm) Most Wednesdays (  8:30-5pm) $30 per visit, cash only  [facility] Adult [facility] Access PROGRAM  [address], [facility] [phone] Patients are seen by appointment only. Walk-ins are not accepted. [place] will see patients 72 years of age and older. One Wednesday Evening (Monthly: Volunteer Based).  $30 per visit, cash only  [place]  [phone] for adults; Children under age 45, call Graduate Pediatric Dentistry at [phone]. Children aged 7-14, please call [phone] to request a pediatric application.  Dental services are provided in all areas of dental care including fillings, crowns and bridges, complete and partial dentures, implants, gum treatment, root canals, and extractions. Preventive care is also provided. Treatment is provided to both adults and children. Patients are selected via a lottery and there is often a waiting list.   [facility] [address], [place]  [phone] [email or web address]   [facility] [address] [place], [place] [phone], Ext. [phone] Second and Fourth Thursday of  each month, opens at 6:30 AM; Clinic ends at 9 AM.  Patients are seen on a first-come first-served basis, and a limited number are seen during each clinic.   [facility]  [address] Hillard Danker [place], [place] [phone]   Eligibility Requirements You must have lived in [place], [place], or [place] counties for at least the last three months.   You cannot be eligible for state or federal sponsored [employer], including [employer], [place], or [employer].   You generally cannot be eligible for healthcare insurance through your employer.    How to apply: Eligibility screenings are held every Tuesday and Wednesday afternoon from 1:00 pm until 4:00 pm. You do not need an appointment for the interview!  [facility] [address], [place], [place]   [place]  [place] Department  [place]  [phone]    Behavioral Health Resources in the Community: Intensive Outpatient Programs Organization         Address  Phone  Notes  [place] [address], [place], [place] [phone]   [facility] Outpatient [address] [place], [place]   ADS: Alcohol & Drug Svcs [address] [place], [place]   [place] [address],  [place], [place] or [phone]   Substance Abuse Resources Organization         Address  Phone  Notes  Alcohol and Drug Services  [phone]   [place]  [phone]   [facility] [place]   Chinita Pester  [phone]   Residential & Outpatient Substance Abuse Program  [phone]   Psychological Services Organization         Address  Phone  Notes  [facility] [place]  [place]  [phone]   [place] [address],  [place] or [phone]    Mobile Crisis Teams Organization         Address  Phone  Notes  [facility], Mobile Crisis Care Unit  [phone]   [facility]ive Psychotherapeutic Services  [address] [place], [place]   [place] Levels [address] [place] [phone]    Self-Help/Support Groups Organization         Address  Phone  Notes  [facility] [place] - variety of support groups  [place] Call for more information  Narcotics Anonymous (NA), [facility]aring [facility] [address], [employer] [place]  2 meetings at this location   Special educational needs teacher         Address  Phone  Notes  ASAP Residential Treatment [place],    [place]  [phone]   [facility]  [address], [place] [phone], [place], [place]   [place], [place] [phone] Admissions: 8am-3pm M-F  Incentives Substance [place] [address],    [place], [place] [phone]   [facility] [address] [place], [place], [place]   [facility] [address]  [place], [place]   [facility] - Intensive Outpatient [address], [place], [place]   [facility] ([place].) [place].,  [place], [place] [phone] or [phone]   [facility] Services (RTS) [address] [place], [place] Accepts Medicaid  [facility] [place] [address],  [place] [phone] Substance Abuse/Addiction Treatment   [facility] Organization         Address  Phone  Notes  [facility]  [phone]   Domenic Schwab, PhD [address] Arlis Porta [place], [place]   [phone] or [phone]   [place], [place] [phone]     [facility] [address], [place], [place] [phone] Insurance/Medicaid/sponsorship through [facility] and Families [address], [address]                                    [place], [place] [phone] [place] [address], [place] [phone]    Dr. Adele Schilder  [phone]   [facility] Dept. 1) [address], [place] 2) [place] 3)  [address], [place] [phone]  [phone]   [place] [phone] or [phone] (After Hours)

## 2013-09-04 NOTE — ED Notes (Signed)
Pt states he has had L shoulder pain for past 5 months. Pt states it hurts to move shoulder. Seen here for same 2 weeks ago, has been unable to follow up with orthopedic MD.

## 2013-09-04 NOTE — ED Provider Notes (Signed)
CSN: [id number]     Arrival date & time 09/04/13  1150 History   First MD Initiated Contact with Patient 09/04/13 1329     Chief Complaint  Patient presents with  . Shoulder Pain   (Consider location/radiation/quality/duration/timing/severity/associated sxs/prior Treatment) HPI Comments: John Cox is a 43 year-old male with a past medical history of DM, presenting the Emergency Department with a chief complaint of Left shoulder pain for several months.  He states the patient is in the deltoid of his shoulder.The patient denies a history of trauma, dislocation, subluxation to the shoulder.  He reports pain increases with movement and has a decrease in ROM.  He reports taking aleve without relief.  He reports relief with Norco.  He denies numbness or tingling in the arm.  He reports trying to follow up with Orthopedic referral but he does not have insurance and reports he is unemployed at this time.  No PCP  The history is provided by the patient and medical records. No language interpreter was used.    Past Medical History  Diagnosis Date  . Diabetes mellitus    History reviewed. No pertinent past surgical history. History reviewed. No pertinent family history. History  Substance Use Topics  . Smoking status: Current Every Day Smoker -- 1.00 packs/day  . Smokeless tobacco: Not on file  . Alcohol Use: Yes     Comment: 12 pk on w/e    Review of Systems  Constitutional: Negative for fever and chills.  Musculoskeletal: Positive for arthralgias. Negative for joint swelling, neck pain and neck stiffness.  Neurological: Negative for weakness and numbness.  All other systems reviewed and are negative.    Allergies  Review of patient's allergies indicates no known allergies.  Home Medications   Current Outpatient Rx  Name  Route  Sig  Dispense  Refill  . ibuprofen (ADVIL,MOTRIN) 200 MG tablet   Oral   Take 400 mg by mouth every 6 (six) hours as needed. For pain         .  insulin NPH-insulin regular (NOVOLIN 70/30) (70-30) 100 UNIT/ML injection   Subcutaneous   Inject 25 Units into the skin 2 (two) times daily.          . insulin regular (HUMULIN R) 100 units/mL injection   Subcutaneous   Inject 3-5 Units into the skin 2 (two) times daily before a meal. 160-200 = 2 units 201-300=4 units 300 or above= 6 units         . oxyCODONE-acetaminophen (PERCOCET/ROXICET) 5-325 MG per tablet   Oral   Take 1-2 tablets by mouth every 6 (six) hours as needed for severe pain.   15 tablet   0   . naproxen (NAPROSYN) 375 MG tablet   Oral   Take 1 tablet (375 mg total) by mouth 2 (two) times daily with a meal.   60 tablet   0    BP 124/76  Pulse 80  Temp(Src) 98.1 F (36.7 C) (Oral)  Resp 16  SpO2 98% Physical Exam  Nursing note and vitals reviewed. Constitutional: He is oriented to person, place, and time. He appears well-developed and well-nourished. No distress.  HENT:  Head: Normocephalic and atraumatic.  Neck: Neck supple.  Cardiovascular: Normal rate and regular rhythm.   Pulmonary/Chest: Effort normal. No respiratory distress. He has no wheezes. He has no rales.  Abdominal: Soft.  Musculoskeletal:       Left shoulder: He exhibits decreased range of motion and tenderness. He exhibits no  swelling, no effusion, no crepitus, no deformity and normal pulse.  Decrease flexion, and abduction of the shoulder. ROM secondary to pain with questionable effort. NV intact.  Patient is able to hold upper extremity against resistance.  Neurological: He is alert and oriented to person, place, and time.  Skin: Skin is warm and dry.    ED Course  Procedures (including critical care time) Labs Review Labs Reviewed - No data to display Imaging Review No results found.  EKG Interpretation   None       MDM   1. Shoulder pain, left    Pt with decreased ROM and shoulder pain for several months.  Likely rotator cuff or adhesive capsulitis. Pt was evaluated  on 08/23/2013 and was discharged with orthopedic referral.  Will try naproxen for relief and cryotherapy. Discussed imaging results from previous visit, and treatment plan with the patient. Return precautions given. Reports understanding and no other concerns at this time.  Patient is stable for discharge at this time. EMR reveals: 08/23/2013 CLINICAL DATA: Generalized left shoulder pain without history of trauma EXAM: LEFT SHOULDER - 2+ VIEW COMPARISON: None. FINDINGS: Three views of the left shoulder reveal the bones to be adequately mineralized. There is no evidence of an acute fracture nor dislocation. There is no lytic nor blastic lesion. The glenohumeral joint space is preserved. The [facility] joint appears intact. The observed portions of the scapula, left clavicle, and upper left ribs appear normal. IMPRESSION: There is no acute bony abnormality of the left shoulder. Electronically Signed By: David SwazilandJordan On: 08/23/2013 07:37    Meds given in ED:  Medications - No data to display  New Prescriptions   NAPROXEN (NAPROSYN) 375 MG TABLET    Take 1 tablet (375 mg total) by mouth 2 (two) times daily with a meal.      Clabe SealLauren M Khaila Velarde, PA-C 09/07/13 0205

## 2013-09-07 NOTE — ED Provider Notes (Signed)
Medical screening examination/treatment/procedure(s) were performed by non-physician practitioner and as supervising physician I was immediately available for consultation/collaboration.  EKG Interpretation   None        Flint MelterElliott L Trisa Cranor, MD 09/07/13 [phone]

## 2013-12-13 ENCOUNTER — Encounter (HOSPITAL_COMMUNITY): Payer: Self-pay | Admitting: Emergency Medicine

## 2013-12-13 ENCOUNTER — Emergency Department (HOSPITAL_COMMUNITY): Payer: Self-pay

## 2013-12-13 ENCOUNTER — Emergency Department (HOSPITAL_COMMUNITY)
Admission: EM | Admit: 2013-12-13 | Discharge: 2013-12-13 | Disposition: A | Discharge status: Home / Self Care | Payer: Self-pay | Attending: Emergency Medicine | Admitting: Emergency Medicine

## 2013-12-13 DIAGNOSIS — Z794 Long term (current) use of insulin: Secondary | ICD-10-CM | POA: Insufficient documentation

## 2013-12-13 DIAGNOSIS — S46919A Strain of unspecified muscle, fascia and tendon at shoulder and upper arm level, unspecified arm, initial encounter: Secondary | ICD-10-CM

## 2013-12-13 DIAGNOSIS — F17200 Nicotine dependence, unspecified, uncomplicated: Secondary | ICD-10-CM | POA: Insufficient documentation

## 2013-12-13 DIAGNOSIS — IMO0002 Reserved for concepts with insufficient information to code with codable children: Secondary | ICD-10-CM | POA: Insufficient documentation

## 2013-12-13 DIAGNOSIS — Y929 Unspecified place or not applicable: Secondary | ICD-10-CM | POA: Insufficient documentation

## 2013-12-13 DIAGNOSIS — R55 Syncope and collapse: Secondary | ICD-10-CM | POA: Insufficient documentation

## 2013-12-13 DIAGNOSIS — R296 Repeated falls: Secondary | ICD-10-CM | POA: Insufficient documentation

## 2013-12-13 DIAGNOSIS — Z791 Long term (current) use of non-steroidal anti-inflammatories (NSAID): Secondary | ICD-10-CM | POA: Insufficient documentation

## 2013-12-13 DIAGNOSIS — Y939 Activity, unspecified: Secondary | ICD-10-CM | POA: Insufficient documentation

## 2013-12-13 DIAGNOSIS — E119 Type 2 diabetes mellitus without complications: Secondary | ICD-10-CM | POA: Insufficient documentation

## 2013-12-13 LAB — CBG MONITORING, ED: GLUCOSE-CAPILLARY: 163 mg/dL — AB (ref 70–99)

## 2013-12-13 MED ORDER — HYDROCODONE-ACETAMINOPHEN 5-325 MG PO TABS: 2.0000 | ORAL_TABLET | ORAL | Status: DC | PRN

## 2013-12-13 NOTE — ED Notes (Signed)
Pt states that last night he fell possible fainted or due to a blood sugar drop, he isn't for sure.  Pt c/o left arm pain and neck stiffness.

## 2013-12-13 NOTE — ED Provider Notes (Signed)
CSN: [id number]     Arrival date & time 12/13/13  0710 History   First MD Initiated Contact with Patient 12/13/13 [phone]     Chief Complaint  Patient presents with  . Fall  . left arm pain      (Consider location/radiation/quality/duration/timing/severity/associated sxs/prior Treatment) HPI Comments: Patient presents to the ER for evaluation of left shoulder injury. Patient reports that he fell last night. He believes that he passed out because of low blood sugar. His girlfriend gave him to stay in a sugar tablet and after the fall and he started to feel better. He did not want to come to the ER at that time, but when he awakened this morning the shoulder was hurting. He reports moderate pain becomes severe if he tries to raise the arm. He denies headache, neck pain, back pain, chest pain, shortness of breath, abdominal pain. No recent illness.  Patient is a 43 y.o. male presenting with fall.  Fall    Past Medical History  Diagnosis Date  . Diabetes mellitus    History reviewed. No pertinent past surgical history. No family history on file. History  Substance Use Topics  . Smoking status: Current Every Day Smoker -- 1.00 packs/day  . Smokeless tobacco: Not on file  . Alcohol Use: Yes     Comment: 12 pk on w/e    Review of Systems  Musculoskeletal: Positive for arthralgias. Negative for back pain and neck pain.  Neurological: Positive for syncope.  All other systems reviewed and are negative.     Allergies  Review of patient's allergies indicates no known allergies.  Home Medications   Prior to Admission medications   Medication Sig Start Date End Date Taking? Authorizing Provider  ibuprofen (ADVIL,MOTRIN) 200 MG tablet Take 400 mg by mouth every 6 (six) hours as needed. For pain    Historical Provider, MD  insulin NPH-insulin regular (NOVOLIN 70/30) (70-30) 100 UNIT/ML injection Inject 25 Units into the skin 2 (two) times daily.     Historical Provider, MD  insulin  regular (HUMULIN R) 100 units/mL injection Inject 3-5 Units into the skin 2 (two) times daily before a meal. 160-200 = 2 units 201-300=4 units 300 or above= 6 units    Historical Provider, MD  naproxen (NAPROSYN) 375 MG tablet Take 1 tablet (375 mg total) by mouth 2 (two) times daily with a meal. 09/04/13   Lauren Doretha ImusM Parker, PA-C  oxyCODONE-acetaminophen (PERCOCET/ROXICET) 5-325 MG per tablet Take 1-2 tablets by mouth every 6 (six) hours as needed for severe pain. 08/23/13   Ethelda ChickMartha K Linker, MD   There were no vitals taken for this visit. Physical Exam  Constitutional: He is oriented to person, place, and time. He appears well-developed and well-nourished. No distress.  HENT:  Head: Normocephalic and atraumatic.  Right Ear: Hearing normal.  Left Ear: Hearing normal.  Nose: Nose normal.  Mouth/Throat: Oropharynx is clear and moist and mucous membranes are normal.  Eyes: Conjunctivae and EOM are normal. Pupils are equal, round, and reactive to light.  Neck: Normal range of motion. Neck supple. No spinous process tenderness present.  Cardiovascular: Regular rhythm, S1 normal and S2 normal.  Exam reveals no gallop and no friction rub.   No murmur heard. Pulmonary/Chest: Effort normal and breath sounds normal. No respiratory distress. He exhibits no tenderness.  Abdominal: Soft. Normal appearance and bowel sounds are normal. There is no hepatosplenomegaly. There is no tenderness. There is no rebound, no guarding, no tenderness at McBurney's point and  negative Murphy's sign. No hernia.  Musculoskeletal:       Left shoulder: He exhibits decreased range of motion and tenderness. He exhibits no swelling and no deformity.       Left elbow: Normal.       Left wrist: Normal.       Thoracic back: Normal. He exhibits no bony tenderness.       Lumbar back: Normal. He exhibits no bony tenderness.  Neurological: He is alert and oriented to person, place, and time. He has normal strength. No cranial nerve  deficit or sensory deficit. Coordination normal. GCS eye subscore is 4. GCS verbal subscore is 5. GCS motor subscore is 6.  Skin: Skin is warm, dry and intact. No rash noted. No cyanosis.  Psychiatric: He has a normal mood and affect. His speech is normal and behavior is normal. Thought content normal.    ED Course  Procedures (including critical care time) Labs Review Labs Reviewed  CBG MONITORING, ED    Imaging Review No results found.   EKG Interpretation None      MDM   Final diagnoses:  None   The patient likely had a hypoglycemic episode last night. He did have a fall with brief loss of consciousness, confusion resolved after being given oral glucose. If that study does not check his sugars more than a couple of times a week, despite taking his insulin. He was counseled that he needs to regularly check his sugars so these events did not occur. No other concerning features to be concerned about neurogenic or cardiogenic syncope. He is symptom-free currently. Blood sugar is normal.  Patient did have pain diffusely around the left shoulder, mostly with active movement. X-ray was negative for fracture or dislocation. Cannot rule out soft tissue disruption, such as rotator cuff injury. Will treat with swelling, analgesia. Followup with orthopedics if not improving.    Gilda Creasehristopher J. Eryk Beavers, MD 12/13/13 [phone]

## 2013-12-13 NOTE — Discharge Instructions (Signed)
Blood Glucose Monitoring, Adult Monitoring your blood glucose (also know as blood sugar) helps you to manage your diabetes. It also helps you and your health care provider monitor your diabetes and determine how well your treatment plan is working. WHY SHOULD YOU MONITOR YOUR BLOOD GLUCOSE?  It can help you understand how food, exercise, and medicine affect your blood glucose.  It allows you to know what your blood glucose is at any given moment. You can quickly tell if you are having low blood glucose (hypoglycemia) or high blood glucose (hyperglycemia).  It can help you and your health care provider know how to adjust your medicines.  It can help you understand how to manage an illness or adjust medicine for exercise. WHEN SHOULD YOU TEST? Your health care provider will help you decide how often you should check your blood glucose. This may depend on the type of diabetes you have, your diabetes control, or the types of medicines you are taking. Be sure to write down all of your blood glucose readings so that this information can be reviewed with your health care provider. See below for examples of testing times that your health care provider may suggest. Type 1 Diabetes  Test 4 times a day if you are in good control, using an insulin pump, or perform multiple daily injections.  If your diabetes is not well-controlled or if you are sick, you may need to monitor more often.  It is a good idea to also monitor:  Before and after exercise.  Between meals and 2 hours after a meal.  Occasionally between 2:00 to 3:00 am. Type 2 Diabetes  It can vary with each person, but generally, if you are on insulin, test 4 times a day.  If you take medicines by mouth (orally), test 2 times a day.  If you are on a controlled diet, test once a day.  If your diabetes is not well controlled or if you are sick, you may need to monitor more often. HOW TO MONITOR YOUR BLOOD GLUCOSE Supplies  Needed  Blood glucose meter.  Test strips for your meter. Each meter has its own strips. You must use the strips that go with your own meter.  A pricking needle (lancet).  A device that holds the lancet (lancing device).  A journal or log book to write down your results. Procedure  Wash your hands with soap and water. Alcohol is not preferred.  Prick the side of your finger (not the tip) with the lancet.  Gently milk the finger until a small drop of blood appears.  Follow the instructions that come with your meter for inserting the test strip, applying blood to the strip, and using your blood glucose meter. Other Areas to Get Blood for Testing Some meters allow you to use other areas of your body (other than your finger) to test your blood. These areas are called alternative sites. The most common alternative sites are:  The forearm.  The thigh.  The back area of the lower leg.  The palm of the hand. The blood flow in these areas is slower. Therefore, the blood glucose values you get may be delayed, and the numbers are different from what you would get from your fingers. Do not use alternative sites if you think you are having hypoglycemia. Your reading will not be accurate. Always use a finger if you are having hypoglycemia. Also, if you cannot feel your lows (hypoglycemia unawareness), always use your fingers for your blood  glucose checks. ADDITIONAL TIPS FOR GLUCOSE MONITORING  Do not reuse lancets.  Always carry your supplies with you.  All blood glucose meters have a 24-hour "hotline" number to call if you have questions or need help.  Adjust (calibrate) your blood glucose meter with a control solution after finishing a few boxes of strips. BLOOD GLUCOSE RECORD KEEPING It is a good idea to keep a daily record or log of your blood glucose readings. Most glucose meters, if not all, keep your glucose records stored in the meter. Some meters come with the ability to download  your records to your home computer. Keeping a record of your blood glucose readings is especially helpful if you are wanting to look for patterns. Make notes to go along with the blood glucose readings because you might forget what happened at that exact time. Keeping good records helps you and your health care provider to work together to achieve good diabetes management.  Document Released: 08/21/2003 Document Revised: 04/20/2013 Document Reviewed: 01/10/2013 [facility] Patient Information 2014 [place], [place].  Muscle Strain A muscle strain is an injury that occurs when a muscle is stretched beyond its normal length. Usually a small number of muscle fibers are torn when this happens. Muscle strain is rated in degrees. First-degree strains have the least amount of muscle fiber tearing and pain. Second-degree and third-degree strains have increasingly more tearing and pain.  Usually, recovery from muscle strain takes 1 2 weeks. Complete healing takes 5 6 weeks.  CAUSES  Muscle strain happens when a sudden, violent force placed on a muscle stretches it too far. This may occur with lifting, sports, or a fall.  RISK FACTORS Muscle strain is especially common in athletes.  SIGNS AND SYMPTOMS At the site of the muscle strain, there may be:  Pain.  Bruising.  Swelling.  Difficulty using the muscle due to pain or lack of normal function. DIAGNOSIS  Your health care provider will perform a physical exam and ask about your medical history. TREATMENT  Often, the best treatment for a muscle strain is resting, icing, and applying cold compresses to the injured area.  HOME CARE INSTRUCTIONS   Use the PRICE method of treatment to promote muscle healing during the first 2 3 days after your injury. The PRICE method involves:  Protecting the muscle from being injured again.  Restricting your activity and resting the injured body part.  Icing your injury. To do this, put ice in a plastic bag. Place a  towel between your skin and the bag. Then, apply the ice and leave it on from 15 20 minutes each hour. After the third day, switch to moist heat packs.  Apply compression to the injured area with a splint or elastic bandage. Be careful not to wrap it too tightly. This may interfere with blood circulation or increase swelling.  Elevate the injured body part above the level of your heart as often as you can.  Only take over-the-counter or prescription medicines for pain, discomfort, or fever as directed by your health care provider.  Warming up prior to exercise helps to prevent future muscle strains. SEEK MEDICAL CARE IF:   You have increasing pain or swelling in the injured area.  You have numbness, tingling, or a significant loss of strength in the injured area. MAKE SURE YOU:   Understand these instructions.  Will watch your condition.  Will get help right away if you are not doing well or get worse. Document Released: 08/18/2005 Document Revised: 06/08/2013  Document Reviewed: 03/17/2013 [facility] Patient Information 2014 [place], [place]. Sling Use After Injury or Surgery You have been put in a sling today because of an injury or following surgery. If you have a tendon or bone injury it may take up to 6 weeks to heal. Use the sling as directed until your caregiver says it is no longer needed. The sling protects and keeps you from using the injured part. Hanging your arm in a sling will give rest and support to the injured part. This also helps with comfort and healing. Slings are used for injuries made worse or more painful by movement. Examples include:  Broken arms.  Broken collarbones.  Shoulder injuries.  Following surgery. The sling should fit comfortably, with your elbow at one end of the sling and your hand at the other end. Your elbow is bent 90 degrees lying across your waist and rests in the sling with your thumb pointing up. Make sure that the hand of the injured arm does  not droop down. That could stretch some nerves in the wrist. Your hand should be slightly higher than your elbow. You may also pad the sling behind your neck with some cloth or foam rubber.  A swathe may also be used if it is necessary to keep you from lifting your injured arm. A swathe is a wrap or ace bandage that goes around your chest over your injured arm.  To take the weight off your neck, some slings have a strap that goes around your neck and down your back. One strap is connected to the closed elbow side of the sling with the other end of the strap attached to the wrist side. With a sling like this, your injured shoulder, arm, wrist, or hand is in the sling, the weight is more on your shoulder and back. This is different from the illustration where the sling is supported only by the neck.  In an emergency, a sling can be as simple as a belt or towel tied around your neck to hold your forearm.  HOME CARE INSTRUCTIONS   Do not use your shoulder until instructed to by your caregiver.  If you have been prescribed physical therapy, keep appointments as directed.  For the first couple days following your injury and during times when you are sore, you may use ice on the injured area for 15-20 minutes 03-04 times per day while awake. Put the ice in a plastic bag and place a towel between the bag of ice and your skin. This will help keep the swelling down.  If there is numbness in the fifth finger and ring fingers you may need to pad the elbow to relieve pressure on the ulnar nerve (the crazy bone).  Keep your arm on your chest when lying down.  If a plaster splint was applied, wear the splint until you are seen for a follow-up examination. Rest it on nothing harder than a pillow the first 24 hours. Do not get it wet. You may take it off to take a shower or bath unless instructed otherwise by your caregiver.  You may have been given an elastic bandage to use with the plaster splint or alone. The  splint is too tight if you have numbness, tingling, or if your hand becomes cold and blue. Adjust or reapply the bandage to make it comfortable.  Only take over-the-counter or prescription medicines for pain, discomfort, or fever as directed by your caregiver.  If range of motion exercises are  permitted by your caregiver, do not go over the limits suggested. If you have increased pain from doing gentle exercises, stop the exercises until you see your caregiver again.  The length of time needed for healing depends on what your injury or surgery was. SEEK IMMEDIATE MEDICAL CARE IF:   You have an increase in bruising, swelling or pain in the area of your injury or surgery.  You notice a blue color of or coldness in your fingers.  Pain relief is not obtained with medications or any of your problems are getting worse. Document Released: 04/01/2004 Document Revised: 08/04/2012 Document Reviewed: 07/03/2007 [facility] Patient Information 2014 [place], [place].

## 2014-05-13 ENCOUNTER — Encounter (HOSPITAL_COMMUNITY): Payer: Self-pay | Admitting: Emergency Medicine

## 2014-05-13 ENCOUNTER — Emergency Department (HOSPITAL_COMMUNITY)
Admission: EM | Admit: 2014-05-13 | Discharge: 2014-05-14 | Disposition: A | Discharge status: Home / Self Care | Payer: Self-pay | Attending: Emergency Medicine | Admitting: Emergency Medicine

## 2014-05-13 DIAGNOSIS — F339 Major depressive disorder, recurrent, unspecified: Secondary | ICD-10-CM | POA: Diagnosis present

## 2014-05-13 DIAGNOSIS — F39 Unspecified mood [affective] disorder: Secondary | ICD-10-CM | POA: Insufficient documentation

## 2014-05-13 DIAGNOSIS — F17200 Nicotine dependence, unspecified, uncomplicated: Secondary | ICD-10-CM | POA: Insufficient documentation

## 2014-05-13 DIAGNOSIS — R1084 Generalized abdominal pain: Secondary | ICD-10-CM | POA: Insufficient documentation

## 2014-05-13 DIAGNOSIS — R197 Diarrhea, unspecified: Secondary | ICD-10-CM | POA: Insufficient documentation

## 2014-05-13 DIAGNOSIS — R45851 Suicidal ideations: Secondary | ICD-10-CM

## 2014-05-13 DIAGNOSIS — F329 Major depressive disorder, single episode, unspecified: Secondary | ICD-10-CM

## 2014-05-13 DIAGNOSIS — E109 Type 1 diabetes mellitus without complications: Secondary | ICD-10-CM

## 2014-05-13 DIAGNOSIS — F1410 Cocaine abuse, uncomplicated: Secondary | ICD-10-CM | POA: Insufficient documentation

## 2014-05-13 DIAGNOSIS — F3289 Other specified depressive episodes: Secondary | ICD-10-CM | POA: Insufficient documentation

## 2014-05-13 DIAGNOSIS — Z008 Encounter for other general examination: Secondary | ICD-10-CM | POA: Insufficient documentation

## 2014-05-13 DIAGNOSIS — F1910 Other psychoactive substance abuse, uncomplicated: Secondary | ICD-10-CM

## 2014-05-13 DIAGNOSIS — R112 Nausea with vomiting, unspecified: Secondary | ICD-10-CM

## 2014-05-13 DIAGNOSIS — F1210 Cannabis abuse, uncomplicated: Secondary | ICD-10-CM | POA: Insufficient documentation

## 2014-05-13 DIAGNOSIS — R42 Dizziness and giddiness: Secondary | ICD-10-CM | POA: Insufficient documentation

## 2014-05-13 DIAGNOSIS — F32A Depression, unspecified: Secondary | ICD-10-CM

## 2014-05-13 DIAGNOSIS — E119 Type 2 diabetes mellitus without complications: Secondary | ICD-10-CM | POA: Insufficient documentation

## 2014-05-13 LAB — CBC WITH DIFFERENTIAL/PLATELET
Basophils Absolute: 0 10*3/uL (ref 0.0–0.1)
Basophils Relative: 0 % (ref 0–1)
EOS ABS: 0 10*3/uL (ref 0.0–0.7)
Eosinophils Relative: 0 % (ref 0–5)
HCT: 42.8 % (ref 39.0–52.0)
Hemoglobin: 14.3 g/dL (ref 13.0–17.0)
LYMPHS ABS: 1.1 10*3/uL (ref 0.7–4.0)
Lymphocytes Relative: 12 % (ref 12–46)
MCH: 28.5 pg (ref 26.0–34.0)
MCHC: 33.4 g/dL (ref 30.0–36.0)
MCV: 85.3 fL (ref 78.0–100.0)
Monocytes Absolute: 0.4 10*3/uL (ref 0.1–1.0)
Monocytes Relative: 4 % (ref 3–12)
NEUTROS PCT: 84 % — AB (ref 43–77)
Neutro Abs: 7.2 10*3/uL (ref 1.7–7.7)
Platelets: 255 10*3/uL (ref 150–400)
RBC: 5.02 MIL/uL (ref 4.22–5.81)
RDW: 14.2 % (ref 11.5–15.5)
WBC: 8.7 10*3/uL (ref 4.0–10.5)

## 2014-05-13 LAB — COMPREHENSIVE METABOLIC PANEL
ALBUMIN: 3.3 g/dL — AB (ref 3.5–5.2)
ALK PHOS: 60 U/L (ref 39–117)
ALT: 25 U/L (ref 0–53)
ANION GAP: 15 (ref 5–15)
AST: 21 U/L (ref 0–37)
BILIRUBIN TOTAL: 0.8 mg/dL (ref 0.3–1.2)
BUN: 16 mg/dL (ref 6–23)
CHLORIDE: 98 meq/L (ref 96–112)
CO2: 25 mEq/L (ref 19–32)
Calcium: 9.1 mg/dL (ref 8.4–10.5)
Creatinine, Ser: 1.06 mg/dL (ref 0.50–1.35)
GFR calc Af Amer: >90 mL/min (ref >90)
GFR calc non Af Amer: 84 mL/min — ABNORMAL LOW (ref >90)
Glucose, Bld: 263 mg/dL — ABNORMAL HIGH (ref 70–99)
POTASSIUM: 4.1 meq/L (ref 3.7–5.3)
Sodium: 138 mEq/L (ref 137–147)
TOTAL PROTEIN: 5.9 g/dL — AB (ref 6.0–8.3)

## 2014-05-13 LAB — CBG MONITORING, ED
GLUCOSE-CAPILLARY: 241 mg/dL — AB (ref 70–99)
GLUCOSE-CAPILLARY: 250 mg/dL — AB (ref 70–99)
Glucose-Capillary: 247 mg/dL — ABNORMAL HIGH (ref 70–99)
Glucose-Capillary: 129 mg/dL — ABNORMAL HIGH (ref 70–99)

## 2014-05-13 LAB — URINALYSIS, ROUTINE W REFLEX MICROSCOPIC
Bilirubin Urine: NEGATIVE
Glucose, UA: >1000 mg/dL — AB
HGB URINE DIPSTICK: NEGATIVE
Ketones, ur: >80 mg/dL — AB
Leukocytes, UA: NEGATIVE
NITRITE: NEGATIVE
PH: 6.5 (ref 5.0–8.0)
Protein, ur: NEGATIVE mg/dL
Specific Gravity, Urine: 1.035 — ABNORMAL HIGH (ref 1.005–1.030)
Urobilinogen, UA: 1 mg/dL (ref 0.0–1.0)

## 2014-05-13 LAB — RAPID URINE DRUG SCREEN, HOSP PERFORMED
AMPHETAMINES: NOT DETECTED
Barbiturates: NOT DETECTED
Benzodiazepines: NOT DETECTED
COCAINE: POSITIVE — AB
OPIATES: NOT DETECTED
TETRAHYDROCANNABINOL: POSITIVE — AB

## 2014-05-13 LAB — LIPASE, BLOOD: LIPASE: 21 U/L (ref 11–59)

## 2014-05-13 LAB — URINE MICROSCOPIC-ADD ON

## 2014-05-13 LAB — SALICYLATE LEVEL

## 2014-05-13 LAB — ETHANOL

## 2014-05-13 LAB — ACETAMINOPHEN LEVEL: Acetaminophen (Tylenol), Serum: <15 ug/mL (ref 10–30)

## 2014-05-13 MED ORDER — SODIUM CHLORIDE 0.9 % IV BOLUS (SEPSIS)
1000.0000 mL | Freq: Once | INTRAVENOUS | Status: AC
  Administered 2014-05-13: 1000 mL via INTRAVENOUS

## 2014-05-13 MED ORDER — IBUPROFEN 200 MG PO TABS: 600.0000 mg | ORAL_TABLET | Freq: Three times a day (TID) | ORAL | Status: DC | PRN

## 2014-05-13 MED ORDER — ONDANSETRON HCL 4 MG PO TABS
4.0000 mg | ORAL_TABLET | Freq: Three times a day (TID) | ORAL | Status: DC | PRN
  Administered 2014-05-14: 4 mg via ORAL
  Filled 2014-05-13: qty 1

## 2014-05-13 MED ORDER — INSULIN ASPART 100 UNIT/ML ~~LOC~~ SOLN
0.0000 [IU] | Freq: Three times a day (TID) | SUBCUTANEOUS | Status: DC
  Administered 2014-05-13: 5 [IU] via SUBCUTANEOUS
  Administered 2014-05-14: 11 [IU] via SUBCUTANEOUS
  Filled 2014-05-13 (×2): qty 1

## 2014-05-13 MED ORDER — ZOLPIDEM TARTRATE 5 MG PO TABS: 5.0000 mg | ORAL_TABLET | Freq: Every evening | ORAL | Status: DC | PRN

## 2014-05-13 MED ORDER — ACETAMINOPHEN 325 MG PO TABS
650.0000 mg | ORAL_TABLET | ORAL | Status: DC | PRN
  Administered 2014-05-14: 650 mg via ORAL
  Filled 2014-05-13: qty 2

## 2014-05-13 MED ORDER — ALUM & MAG HYDROXIDE-SIMETH 200-200-20 MG/5ML PO SUSP: 30.0000 mL | ORAL | Status: DC | PRN

## 2014-05-13 MED ORDER — SERTRALINE HCL 50 MG PO TABS
50.0000 mg | ORAL_TABLET | Freq: Every day | ORAL | Status: DC
  Administered 2014-05-13 – 2014-05-14 (×2): 50 mg via ORAL
  Filled 2014-05-13 (×2): qty 1

## 2014-05-13 MED ORDER — INSULIN ASPART 100 UNIT/ML ~~LOC~~ SOLN: 0.0000 [IU] | Freq: Every day | SUBCUTANEOUS | Status: DC

## 2014-05-13 NOTE — ED Notes (Signed)
Pt resting at present, SI, contracts for safety, will continue to monitor for safety.

## 2014-05-13 NOTE — ED Notes (Signed)
I have just phoned report to [place] in [place] and will transport to their rm. [address] shortly.

## 2014-05-13 NOTE — ED Notes (Signed)
Patient belongings moved to locker # 27

## 2014-05-13 NOTE — ED Provider Notes (Signed)
CSN: [id number]     Arrival date & time 05/13/14  1039 History   First MD Initiated Contact with Patient 05/13/14 1046     Chief Complaint  Patient presents with  . Medical Clearance     (Consider location/radiation/quality/duration/timing/severity/associated sxs/prior Treatment) HPI Comments: Patient is a 43 yo M PMHx significant for DM presenting to the ED for multiple complaints. The first complaint is 1-2 days of nausea and multiple episodes of non-bloody non-bilious emesis with generalized abdominal pain. Endorses associated lightheadedness w/o syncope. He states he has been using his mother's insulin. He states his sugars have been running in the 200s and above recently. Patient endorses a relapse with cocaine use last evening. He also complains of increased depression and suicidal ideations without plan. He states his overdosed in the past a suicide attempt. Denies any homicidal ideations, hallucinations, self injury. He does endorse drinking 3 beers last evening but denies any chronic alcohol use.   Past Medical History  Diagnosis Date  . Diabetes mellitus    History reviewed. No pertinent past surgical history. No family history on file. History  Substance Use Topics  . Smoking status: Current Every Day Smoker -- 1.00 packs/day  . Smokeless tobacco: Not on file  . Alcohol Use: Yes     Comment: 12 pk on w/e    Review of Systems  Gastrointestinal: Positive for nausea, vomiting and abdominal pain.  Psychiatric/Behavioral: Positive for suicidal ideas and dysphoric mood.  All other systems reviewed and are negative.     Allergies  Review of patient's allergies indicates no known allergies.  Home Medications   Prior to Admission medications   Medication Sig Start Date End Date Taking? Authorizing Provider  ibuprofen (ADVIL,MOTRIN) 200 MG tablet Take 600 mg by mouth every 6 (six) hours as needed for mild pain.   Yes Historical Provider, MD   BP 125/78  Pulse 77   Temp(Src) 98.3 F (36.8 C) (Oral)  Resp 18  SpO2 99% Physical Exam  Nursing note and vitals reviewed. Constitutional: He is oriented to person, place, and time. He appears well-developed and well-nourished. No distress.  HENT:  Head: Normocephalic and atraumatic.  Right Ear: External ear normal.  Left Ear: External ear normal.  Nose: Nose normal.  Mouth/Throat: Oropharynx is clear and moist. No oropharyngeal exudate.  Eyes: Conjunctivae are normal.  Neck: Normal range of motion. Neck supple.  Cardiovascular: Normal rate, regular rhythm and normal heart sounds.   Pulmonary/Chest: Effort normal and breath sounds normal.  Abdominal: Soft. Normal appearance and bowel sounds are normal. There is no tenderness. There is no rigidity, no rebound and no guarding.  Musculoskeletal: Normal range of motion.  Neurological: He is alert and oriented to person, place, and time.  Skin: Skin is warm and dry. He is not diaphoretic.  Psychiatric: He is not actively hallucinating. He exhibits a depressed mood. He expresses suicidal ideation. He expresses no homicidal ideation. He expresses no suicidal plans and no homicidal plans.    ED Course  Procedures (including critical care time) Medications  acetaminophen (TYLENOL) tablet 650 mg (not administered)  ibuprofen (ADVIL,MOTRIN) tablet 600 mg (not administered)  zolpidem (AMBIEN) tablet 5 mg (not administered)  ondansetron (ZOFRAN) tablet 4 mg (not administered)  alum & mag hydroxide-simeth (MAALOX/MYLANTA) 200-200-20 MG/5ML suspension 30 mL (not administered)  insulin aspart (novoLOG) injection 0-15 Units (not administered)  insulin aspart (novoLOG) injection 0-5 Units (not administered)  sodium chloride 0.9 % bolus 1,000 mL (0 mLs Intravenous Stopped 05/13/14 1311)  Labs Review Labs Reviewed  COMPREHENSIVE METABOLIC PANEL - Abnormal; Notable for the following:    Glucose, Bld 263 (*)    Total Protein 5.9 (*)    Albumin 3.3 (*)    GFR calc  non Af Amer 84 (*)    All other components within normal limits  SALICYLATE LEVEL - Abnormal; Notable for the following:    Salicylate Lvl <2.0 (*)    All other components within normal limits  URINE RAPID DRUG SCREEN (HOSP PERFORMED) - Abnormal; Notable for the following:    Cocaine POSITIVE (*)    Tetrahydrocannabinol POSITIVE (*)    All other components within normal limits  URINALYSIS, ROUTINE W REFLEX MICROSCOPIC - Abnormal; Notable for the following:    Specific Gravity, Urine 1.035 (*)    Glucose, UA >1000 (*)    Ketones, ur >80 (*)    All other components within normal limits  CBC WITH DIFFERENTIAL - Abnormal; Notable for the following:    Neutrophils Relative % 84 (*)    All other components within normal limits  CBG MONITORING, ED - Abnormal; Notable for the following:    Glucose-Capillary 247 (*)    All other components within normal limits  CBG MONITORING, ED - Abnormal; Notable for the following:    Glucose-Capillary 241 (*)    All other components within normal limits  ETHANOL  ACETAMINOPHEN LEVEL  LIPASE, BLOOD  URINE MICROSCOPIC-ADD ON    Imaging Review No results found.   EKG Interpretation None      MDM   Final diagnoses:  Depression  Suicidal ideation  Nausea vomiting and diarrhea    Filed Vitals:   05/13/14 1345  BP: 125/78  Pulse: 77  Temp:   Resp: 18   Afebrile, NAD, non-toxic appearing, AAOx4. I have reviewed nursing notes, vital signs, and all appropriate lab and imaging results for this patient.  Patient presenting to the ED for suicidal ideation without plan with increased depression and relapse with cocaine abuse. No HI, hallucinations, self injury, or chronic ETOH abuse.  Patient complaining of nausea, vomiting, diarrhea prior to arrival, no episodes while in ED. Abdominal examination is benign. Vitals are stable, no fever.  No signs of dehydration, tolerating PO fluids > 6 oz.  Lungs are clear.  No focal abdominal pain, no concern  for appendicitis, cholecystitis, pancreatitis, ruptured viscus, UTI, kidney stone, or any other abdominal etiology. No evidence of DKA. Bolus given for rehydration and symptom control.   Patient has been medically cleared and moved to the back awaiting TTS consultation. Patient d/w with Dr. Micheline Maze, agrees with plan.       Jeannetta Ellis, PA-C 05/13/14 1703

## 2014-05-13 NOTE — Progress Notes (Signed)
Pt's referral has been faxed to the following intpx facilities with bed availability:  [name]- per [name] accepting referrals  [facility]- per [place] Burner can fax  [facility]- per [place] [facility] having d/c's and can fax for review   The following facilities are at capacity or do not have SA programming:  [facility]- SA exclusionary  [facility]- do not have primary SA [address]- per Misty Stanley no [employer]- per [employer] at capacity  [facility]- per [place] at [employer]- per [place] at capacity  [place]- no SA [employer]- per Rosey Bath at [employer]- per [place] at capacity  [facility]- per [place] at [employer]- per [employer] at capacity and "have an extensive wait list"  [facility]- per [place] at [employer]- per [place] at capacity  [facility]- per Dannielle Huh at [employer]- no longer accept referrals per [facility]- [facility]- per [place]ia Client at capacity  [facility]- per [place] at capacity    [employer] Disposition [facility]

## 2014-05-13 NOTE — ED Notes (Addendum)
Pt in from home by ems. 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 C/o depression, SI. Denies HI. C/o lightheadedness. Has been out of insulin for past 6-8 months, has been using his mother's. Unsure of which kind.

## 2014-05-13 NOTE — BH Assessment (Signed)
Assessment Note  John Cox is an 43 y.o. male. Patient was brought into the ED by EMS initiated by the patient due to medical complications and depression.  Patient currently endorsing SI with plan to overdose.  Patient reports two previous suicide attempts in the past one overdose and thought of a jumping off bridge.  Patient reports homicidal ideations with no plan but is mad at the person that gave him the drug to relapse although he admits to understanding its nobody's fault but his own.  Patient denies hallucinations and other self-injurious behaviors.   Patient reports substance use includes beer 2-25oz cans 3x week, marijuana 5x week about $150 weekly, and recent use of crack cocaine.  Patient had been clean from crack at the least 3 years ago but recently used 2 days ago.  Patient reports that he stole some property from his girlfriend and he believes she file charges against him.    CSW ran patient with Catha Nottingham, NP who recommends inpatient treatment for safety and stabilization.     Axis I: Substance Induced Mood Disorder and alcohol abuse, marijuana abuse Axis II: Deferred Axis III:  Past Medical History  Diagnosis Date  . Diabetes mellitus    Axis IV: economic problems, housing problems, other psychosocial or environmental problems, problems related to social environment, problems with access to health care services and problems with primary support group Axis V: 41-50 serious symptoms  Past Medical History:  Past Medical History  Diagnosis Date  . Diabetes mellitus     History reviewed. No pertinent past surgical history.  Family History: No family history on file.  Social History:  reports that he has been smoking.  He does not have any smokeless tobacco history on file. He reports that he drinks alcohol. He reports that he uses illicit drugs (Marijuana) about 3 times per week.  Additional Social History:     CIWA: CIWA-Ar BP: 125/78 mmHg Pulse Rate: 77 COWS:     Allergies: No Known Allergies  Home Medications:  (Not in a hospital admission)  OB/GYN Status:  No LMP for male patient.  General Assessment Data Location of Assessment: [facility] ED ACT Assessment: Yes Is this a Tele or Face-to-Face Assessment?: Face-to-Face Is this an Initial Assessment or a Re-assessment for this encounter?: Initial Assessment Living Arrangements: Other relatives Can pt return to current living arrangement?: Yes Admission Status: Voluntary Is patient capable of signing voluntary admission?: Yes Transfer from: Home Referral Source: Self/Family/Friend  Medical Screening Exam [facility] Walk-in ONLY) Medical Exam completed: Yes  [facility] Crisis Care Plan Living Arrangements: Other relatives Name of Psychiatrist: none Name of Therapist: none  Education Status Is patient currently in school?: No  Risk to self with the past 6 months Suicidal Ideation: Yes-Currently Present Suicidal Intent: Yes-Currently Present Is patient at risk for suicide?: Yes Suicidal Plan?: Yes-Currently Present Specify Current Suicidal Plan: overdose Access to Means: Yes Specify Access to Suicidal Means: over the counter pills What has been your use of drugs/alcohol within the last 12 months?: THC, alcohol, crack Previous Attempts/Gestures: Yes How many times?: 2 Triggers for Past Attempts:  (Drug use) Intentional Self Injurious Behavior: None Family Suicide History: No Recent stressful life event(s): Loss (Comment);Conflict (Comment);Other (Comment) (relapse) Persecutory voices/beliefs?: No Depression: Yes Depression Symptoms: Guilt;Isolating;Despondent;Loss of interest in usual pleasures;Feeling worthless/self pity;Feeling angry/irritable Substance abuse history and/or treatment for substance abuse?: Yes  Risk to Others within the past 6 months Homicidal Ideation: Yes-Currently Present Thoughts of Harm to Others: No-Not Currently Present/Within Last  6 Months Current Homicidal Intent:  No-Not Currently/Within Last 6 Months Current Homicidal Plan: No-Not Currently/Within Last 6 Months Access to Homicidal Means: No History of harm to others?: No Assessment of Violence: None Noted Does patient have access to weapons?:  (pt mentioned access to pocket knives) Criminal Charges Pending?: Yes Describe Pending Criminal Charges: Pt believes family filed charges for stolen goods Does patient have a court date: No  Psychosis Hallucinations: None noted Delusions: None noted  Mental Status Report Appear/Hygiene: In hospital gown Eye Contact: Poor Motor Activity: Freedom of movement Speech: Logical/coherent Level of Consciousness: Drowsy Mood: Depressed Affect: Flat Anxiety Level: None Thought Processes: Coherent Judgement: Unimpaired Orientation: Person;Place;Time;Situation Obsessive Compulsive Thoughts/Behaviors: None  Cognitive Functioning Concentration: Fair Memory: Remote Intact;Recent Intact IQ: Average Insight: Fair Impulse Control: Fair Appetite: Fair Vegetative Symptoms: None  ADLScreening [facility]) Patient's cognitive ability adequate to safely complete daily activities?: Yes Patient able to express need for assistance with ADLs?: Yes Independently performs ADLs?: Yes (appropriate for developmental age)  Prior Inpatient Therapy Prior Inpatient Therapy: Yes Prior Therapy Dates: 2007 Prior Therapy Facilty/Provider(s): [facility] Reason for Treatment: SI  Prior Outpatient Therapy Prior Outpatient Therapy: No  ADL Screening (condition at time of admission) Patient's cognitive ability adequate to safely complete daily activities?: Yes Patient able to express need for assistance with ADLs?: Yes Independently performs ADLs?: Yes (appropriate for developmental age)         Values / Beliefs Cultural Requests During Hospitalization: None Spiritual Requests During Hospitalization: None   Advance Directives (For Healthcare) Does patient have  an advance directive?: No    Additional Information 1:1 In Past 12 Months?: No CIRT Risk: No Elopement Risk: No Does patient have medical clearance?: Yes     Disposition:  Disposition Initial Assessment Completed for this Encounter: Yes Disposition of Patient: Inpatient treatment program Type of inpatient treatment program: Adult  On Site Evaluation by:   Reviewed with Physician:    Maryelizabeth Rowan A 05/13/2014 4:00 PM

## 2014-05-13 NOTE — ED Notes (Signed)
As I was starting his IV he told me he had h/a and some nausea.  He fell asleep before I left the room; sleeping soundly.  My plan is to monitor, and if pain c/o persists, will address it with provider.

## 2014-05-13 NOTE — ED Notes (Signed)
He states he recently used cocaine, which he has not done for quite some time.  He further tells me that he has been using his mother's insulin (doesn't know type--he states it is "clear") for some 6-8 months "because I can't afford mine".  He is drowsy and is comfortable in demeanor and appearance.  He c/o generalized h/a and generalized, mild abd. Pain.  He is in no distress.

## 2014-05-13 NOTE — ED Notes (Addendum)
Patient belongings: Shoes, shirt, belt, slide shoes and wallet. Locker 32

## 2014-05-14 ENCOUNTER — Emergency Department (HOSPITAL_COMMUNITY): Payer: Self-pay

## 2014-05-14 ENCOUNTER — Inpatient Hospital Stay (HOSPITAL_COMMUNITY)
Admission: EM | Admit: 2014-05-14 | Discharge: 2014-05-18 | DRG: 638 | Disposition: A | Discharge status: Home / Self Care | Payer: Self-pay | Attending: Internal Medicine | Admitting: Internal Medicine

## 2014-05-14 ENCOUNTER — Encounter (HOSPITAL_COMMUNITY): Payer: Self-pay | Admitting: Registered Nurse

## 2014-05-14 ENCOUNTER — Encounter (HOSPITAL_COMMUNITY): Payer: Self-pay | Admitting: Emergency Medicine

## 2014-05-14 DIAGNOSIS — F329 Major depressive disorder, single episode, unspecified: Secondary | ICD-10-CM | POA: Diagnosis present

## 2014-05-14 DIAGNOSIS — R112 Nausea with vomiting, unspecified: Secondary | ICD-10-CM

## 2014-05-14 DIAGNOSIS — Z5987 Material hardship due to limited financial resources, not elsewhere classified: Secondary | ICD-10-CM

## 2014-05-14 DIAGNOSIS — Z79899 Other long term (current) drug therapy: Secondary | ICD-10-CM

## 2014-05-14 DIAGNOSIS — F1994 Other psychoactive substance use, unspecified with psychoactive substance-induced mood disorder: Secondary | ICD-10-CM

## 2014-05-14 DIAGNOSIS — F1410 Cocaine abuse, uncomplicated: Secondary | ICD-10-CM | POA: Diagnosis present

## 2014-05-14 DIAGNOSIS — F1210 Cannabis abuse, uncomplicated: Secondary | ICD-10-CM | POA: Diagnosis present

## 2014-05-14 DIAGNOSIS — F17200 Nicotine dependence, unspecified, uncomplicated: Secondary | ICD-10-CM | POA: Diagnosis present

## 2014-05-14 DIAGNOSIS — E1010 Type 1 diabetes mellitus with ketoacidosis without coma: Principal | ICD-10-CM | POA: Diagnosis present

## 2014-05-14 DIAGNOSIS — F3289 Other specified depressive episodes: Secondary | ICD-10-CM | POA: Diagnosis present

## 2014-05-14 DIAGNOSIS — F1910 Other psychoactive substance abuse, uncomplicated: Secondary | ICD-10-CM

## 2014-05-14 DIAGNOSIS — Z23 Encounter for immunization: Secondary | ICD-10-CM

## 2014-05-14 DIAGNOSIS — R109 Unspecified abdominal pain: Secondary | ICD-10-CM | POA: Diagnosis present

## 2014-05-14 DIAGNOSIS — R894 Abnormal immunological findings in specimens from other organs, systems and tissues: Secondary | ICD-10-CM | POA: Diagnosis present

## 2014-05-14 DIAGNOSIS — Z598 Other problems related to housing and economic circumstances: Secondary | ICD-10-CM

## 2014-05-14 DIAGNOSIS — Z9119 Patient's noncompliance with other medical treatment and regimen: Secondary | ICD-10-CM

## 2014-05-14 DIAGNOSIS — E1110 Type 2 diabetes mellitus with ketoacidosis without coma: Secondary | ICD-10-CM | POA: Diagnosis present

## 2014-05-14 DIAGNOSIS — D72829 Elevated white blood cell count, unspecified: Secondary | ICD-10-CM | POA: Diagnosis present

## 2014-05-14 DIAGNOSIS — D649 Anemia, unspecified: Secondary | ICD-10-CM | POA: Diagnosis present

## 2014-05-14 DIAGNOSIS — Z794 Long term (current) use of insulin: Secondary | ICD-10-CM

## 2014-05-14 DIAGNOSIS — Z91199 Patient's noncompliance with other medical treatment and regimen due to unspecified reason: Secondary | ICD-10-CM

## 2014-05-14 DIAGNOSIS — Z86718 Personal history of other venous thrombosis and embolism: Secondary | ICD-10-CM

## 2014-05-14 DIAGNOSIS — D6859 Other primary thrombophilia: Secondary | ICD-10-CM | POA: Diagnosis present

## 2014-05-14 DIAGNOSIS — D6861 Antiphospholipid syndrome: Secondary | ICD-10-CM

## 2014-05-14 HISTORY — DX: Major depressive disorder, single episode, unspecified: F32.9

## 2014-05-14 HISTORY — DX: Nausea with vomiting, unspecified: R11.2

## 2014-05-14 HISTORY — DX: Suicidal ideations: R45.851

## 2014-05-14 HISTORY — DX: Other psychoactive substance abuse, uncomplicated: F19.10

## 2014-05-14 LAB — COMPREHENSIVE METABOLIC PANEL
ALBUMIN: 3.8 g/dL (ref 3.5–5.2)
ALK PHOS: 81 U/L (ref 39–117)
ALT: 34 U/L (ref 0–53)
ANION GAP: 31 — AB (ref 5–15)
AST: 30 U/L (ref 0–37)
BILIRUBIN TOTAL: 0.9 mg/dL (ref 0.3–1.2)
BUN: 26 mg/dL — AB (ref 6–23)
CO2: 11 mEq/L — ABNORMAL LOW (ref 19–32)
Calcium: 9.4 mg/dL (ref 8.4–10.5)
Chloride: 94 mEq/L — ABNORMAL LOW (ref 96–112)
Creatinine, Ser: 1.45 mg/dL — ABNORMAL HIGH (ref 0.50–1.35)
GFR calc Af Amer: 66 mL/min — ABNORMAL LOW (ref >90)
GFR calc non Af Amer: 57 mL/min — ABNORMAL LOW (ref >90)
Glucose, Bld: 447 mg/dL — ABNORMAL HIGH (ref 70–99)
Potassium: 5.7 mEq/L — ABNORMAL HIGH (ref 3.7–5.3)
Sodium: 136 mEq/L — ABNORMAL LOW (ref 137–147)
TOTAL PROTEIN: 6.6 g/dL (ref 6.0–8.3)

## 2014-05-14 LAB — URINALYSIS, ROUTINE W REFLEX MICROSCOPIC
Bilirubin Urine: NEGATIVE
Glucose, UA: >1000 mg/dL — AB
Hgb urine dipstick: NEGATIVE
Ketones, ur: >80 mg/dL — AB
LEUKOCYTES UA: NEGATIVE
Nitrite: NEGATIVE
PH: 5 (ref 5.0–8.0)
Protein, ur: NEGATIVE mg/dL
SPECIFIC GRAVITY, URINE: 1.027 (ref 1.005–1.030)
Urobilinogen, UA: 0.2 mg/dL (ref 0.0–1.0)

## 2014-05-14 LAB — CBC
HCT: 46.3 % (ref 39.0–52.0)
HEMOGLOBIN: 15.6 g/dL (ref 13.0–17.0)
MCH: 28.7 pg (ref 26.0–34.0)
MCHC: 33.7 g/dL (ref 30.0–36.0)
MCV: 85.1 fL (ref 78.0–100.0)
Platelets: 300 10*3/uL (ref 150–400)
RBC: 5.44 MIL/uL (ref 4.22–5.81)
RDW: 14.6 % (ref 11.5–15.5)
WBC: 13.9 10*3/uL — AB (ref 4.0–10.5)

## 2014-05-14 LAB — BLOOD GAS, ARTERIAL
Acid-base deficit: 17.3 mmol/L — ABNORMAL HIGH (ref 0.0–2.0)
BICARBONATE: 9.4 meq/L — AB (ref 20.0–24.0)
Drawn by: 308601
FIO2: 0.21 %
O2 SAT: 96.8 %
PATIENT TEMPERATURE: 98.6
TCO2: 8.6 mmol/L (ref 0–100)
pCO2 arterial: 24.6 mmHg — ABNORMAL LOW (ref 35.0–45.0)
pH, Arterial: 7.206 — ABNORMAL LOW (ref 7.350–7.450)
pO2, Arterial: 110 mmHg — ABNORMAL HIGH (ref 80.0–100.0)

## 2014-05-14 LAB — CBG MONITORING, ED
GLUCOSE-CAPILLARY: 302 mg/dL — AB (ref 70–99)
GLUCOSE-CAPILLARY: 315 mg/dL — AB (ref 70–99)
GLUCOSE-CAPILLARY: 391 mg/dL — AB (ref 70–99)
Glucose-Capillary: 266 mg/dL — ABNORMAL HIGH (ref 70–99)
Glucose-Capillary: 120 mg/dL — ABNORMAL HIGH (ref 70–99)

## 2014-05-14 LAB — LACTIC ACID, PLASMA: Lactic Acid, Venous: 1.9 mmol/L (ref 0.5–2.2)

## 2014-05-14 LAB — URINE MICROSCOPIC-ADD ON: Urine-Other: NONE SEEN

## 2014-05-14 LAB — LIPASE, BLOOD: LIPASE: 13 U/L (ref 11–59)

## 2014-05-14 MED ORDER — HEPARIN SODIUM (PORCINE) 5000 UNIT/ML IJ SOLN
5000.0000 [IU] | Freq: Three times a day (TID) | INTRAMUSCULAR | Status: DC
  Administered 2014-05-15 – 2014-05-18 (×11): 5000 [IU] via SUBCUTANEOUS
  Filled 2014-05-14 (×16): qty 1

## 2014-05-14 MED ORDER — SODIUM CHLORIDE 0.9 % IV SOLN
INTRAVENOUS | Status: DC
  Administered 2014-05-15: 100 mL/h via INTRAVENOUS

## 2014-05-14 MED ORDER — GI COCKTAIL ~~LOC~~
30.0000 mL | Freq: Once | ORAL | Status: AC
  Administered 2014-05-14: 30 mL via ORAL
  Filled 2014-05-14: qty 30

## 2014-05-14 MED ORDER — PROMETHAZINE HCL 25 MG/ML IJ SOLN
12.5000 mg | Freq: Once | INTRAMUSCULAR | Status: AC
  Administered 2014-05-14: 12.5 mg via INTRAMUSCULAR
  Filled 2014-05-14: qty 1

## 2014-05-14 MED ORDER — SODIUM CHLORIDE 0.9 % IV BOLUS (SEPSIS)
1000.0000 mL | Freq: Once | INTRAVENOUS | Status: AC
  Administered 2014-05-14: 1000 mL via INTRAVENOUS

## 2014-05-14 MED ORDER — SODIUM CHLORIDE 0.9 % IV SOLN
INTRAVENOUS | Status: DC
  Administered 2014-05-15: 2.6 [IU]/h via INTRAVENOUS
  Filled 2014-05-14: qty 2.5

## 2014-05-14 MED ORDER — PROMETHAZINE HCL 25 MG PO TABS
25.0000 mg | ORAL_TABLET | Freq: Once | ORAL | Status: DC
  Filled 2014-05-14: qty 1

## 2014-05-14 MED ORDER — ONDANSETRON 4 MG PO TBDP
8.0000 mg | ORAL_TABLET | Freq: Two times a day (BID) | ORAL | Status: DC
  Filled 2014-05-14: qty 2

## 2014-05-14 MED ORDER — ONDANSETRON 4 MG PO TBDP
8.0000 mg | ORAL_TABLET | Freq: Once | ORAL | Status: AC
  Administered 2014-05-14: 8 mg via ORAL
  Filled 2014-05-14: qty 2

## 2014-05-14 MED ORDER — SODIUM CHLORIDE 0.9 % IV SOLN: 1000.0000 mL | INTRAVENOUS | Status: DC

## 2014-05-14 MED ORDER — SODIUM CHLORIDE 0.9 % IV SOLN
INTRAVENOUS | Status: AC
  Administered 2014-05-15: 999 mL/h via INTRAVENOUS

## 2014-05-14 MED ORDER — INSULIN ASPART 100 UNIT/ML ~~LOC~~ SOLN: 0.0000 [IU] | Freq: Every day | SUBCUTANEOUS | Status: DC

## 2014-05-14 MED ORDER — SODIUM CHLORIDE 0.9 % IV SOLN
1000.0000 mL | Freq: Once | INTRAVENOUS | Status: AC
  Administered 2014-05-14: 1000 mL via INTRAVENOUS

## 2014-05-14 MED ORDER — SODIUM CHLORIDE 0.9 % IV SOLN: 1000.0000 mL | Freq: Once | INTRAVENOUS | Status: DC

## 2014-05-14 MED ORDER — SERTRALINE HCL 50 MG PO TABS: 50.0000 mg | ORAL_TABLET | Freq: Every day | ORAL | Status: DC

## 2014-05-14 MED ORDER — SERTRALINE HCL 50 MG PO TABS
50.0000 mg | ORAL_TABLET | Freq: Every day | ORAL | Status: DC
  Administered 2014-05-15 – 2014-05-18 (×4): 50 mg via ORAL
  Filled 2014-05-14 (×4): qty 1

## 2014-05-14 MED ORDER — INSULIN ASPART 100 UNIT/ML ~~LOC~~ SOLN: 0.0000 [IU] | Freq: Three times a day (TID) | SUBCUTANEOUS | Status: DC

## 2014-05-14 MED ORDER — DEXTROSE-NACL 5-0.45 % IV SOLN
INTRAVENOUS | Status: DC
  Administered 2014-05-15: 100 mL via INTRAVENOUS

## 2014-05-14 MED ORDER — ONDANSETRON HCL 4 MG/2ML IJ SOLN
4.0000 mg | Freq: Once | INTRAMUSCULAR | Status: AC
  Administered 2014-05-14: 4 mg via INTRAVENOUS
  Filled 2014-05-14: qty 2

## 2014-05-14 MED ORDER — FAMOTIDINE 20 MG PO TABS
20.0000 mg | ORAL_TABLET | Freq: Once | ORAL | Status: AC
  Administered 2014-05-14: 20 mg via ORAL
  Filled 2014-05-14: qty 1

## 2014-05-14 MED ORDER — DEXTROSE 50 % IV SOLN
25.0000 mL | INTRAVENOUS | Status: DC | PRN
  Administered 2014-05-15: 14 mL via INTRAVENOUS
  Filled 2014-05-14: qty 50

## 2014-05-14 MED ORDER — INSULIN REGULAR HUMAN 100 UNIT/ML IJ SOLN
INTRAMUSCULAR | Status: DC
  Filled 2014-05-14: qty 2.5

## 2014-05-14 NOTE — ED Notes (Signed)
Bed: WU98 Expected date:  Expected time:  Means of arrival:  Comments: EMS 43yo m N/V/D

## 2014-05-14 NOTE — H&P (Signed)
[facility] History and Physical  Patient: John Cox  ZOX:[id number]  DOB: Dec 05, 1970  DOS: the patient was seen and examined on 05/14/2014 PCP: Default, Provider, MD  Chief Complaint: Abdominal pain, nausea vomiting and diarrhea  HPI: John Cox is a 43 y.o. male with Past medical history of substance abuse, diabetes mellitus, portal venous thrombosis, antiphospholipid antibodies not on any anticoagulation due to noncompliance. The patient is presenting with complaints of abdominal pain nausea vomiting diarrhea. He mentions this symptoms have been ongoing since last 3 days and progressively worsening. He was seen in the ER earlier today after drug abuse with cocaine. He was seen by psychiatry and was discharged home as he did not have any suicidal or homicidal ideation. After going home he continues to have the nausea vomiting and he mentions he has nearly 9-10 bowel movements. He did not have any blood anywhere it denies any fever but complains of chills. Complains of of acid reflux. Complaints of shortness of breath without any cough. He denies any burning urination. He initially mentions to ER physician that he uses insulin on and off, that too of his mothers, and he mentions to me that he uses long-acting insulin 24 units twice a day. He gets up with from [employer] although he is unable to specify which Dr Erskin Burnet him prescription for the insulin. He mentions use of alcohol on and off, he smokes one pack a day, he has used marijuana in the past and cocaine 2 days ago.  The patient is coming from home. And at his baseline independent for most of his ADL.  Review of Systems: as mentioned in the history of present illness.  A Comprehensive review of the other systems is negative.  Past Medical History  Diagnosis Date  . Diabetes mellitus   . Depression   . Suicidal ideation   . Nausea & vomiting   . Polysubstance abuse    History reviewed. No pertinent past surgical  history. Social History:  reports that he has been smoking.  He does not have any smokeless tobacco history on file. He reports that he drinks alcohol. He reports that he uses illicit drugs (Marijuana) about 3 times per week.  No Known Allergies  History reviewed. No pertinent family history.  Prior to Admission medications   Medication Sig Start Date End Date Taking? Authorizing Provider  ibuprofen (ADVIL,MOTRIN) 200 MG tablet Take 600 mg by mouth every 6 (six) hours as needed for mild pain.    Historical Provider, MD  insulin aspart (NOVOLOG) 100 UNIT/ML injection Inject 0-5 Units into the skin at bedtime. 05/14/14   Shuvon Rankin, NP  sertraline (ZOLOFT) 50 MG tablet Take 1 tablet (50 mg total) by mouth daily. 05/14/14   Shuvon Rankin, NP    Physical Exam: Filed Vitals:   05/14/14 2038  BP: 139/55  Pulse: 78  Temp: 98 F (36.7 C)  TempSrc: Oral  Resp: 19  SpO2: 100%    General: Alert, Awake and Oriented to Time, Place and Person. Appear in mild distress Eyes: PERRL ENT: Oral Mucosa clear dry. Neck: No  JVD Cardiovascular: S1 and S2 Present, no  Murmur, Peripheral Pulses Present Respiratory: Bilateral Air entry equal and Decreased, Clear to Auscultation, no Crackles, no  wheezes Abdomen: Bowel Sound sluggish, Soft and diffuse mild  tender Skin: No  Rash Extremities: No  Pedal edema, no  calf tenderness Neurologic: Grossly no focal neuro deficit.  Labs on Admission:  CBC:  Recent Labs Lab 05/13/14 1132  05/14/14 2117  WBC 8.7 13.9*  NEUTROABS 7.2  --   HGB 14.3 15.6  HCT 42.8 46.3  MCV 85.3 85.1  PLT 255 300    CMP     Component Value Date/Time   NA 136* 05/14/2014 2117   K 5.7* 05/14/2014 2117   CL 94* 05/14/2014 2117   CO2 11* 05/14/2014 2117   GLUCOSE 447* 05/14/2014 2117   BUN 26* 05/14/2014 2117   CREATININE 1.45* 05/14/2014 2117   CALCIUM 9.4 05/14/2014 2117   PROT 6.6 05/14/2014 2117   ALBUMIN 3.8 05/14/2014 2117   AST 30 05/14/2014 2117   ALT 34 05/14/2014  2117   ALKPHOS 81 05/14/2014 2117   BILITOT 0.9 05/14/2014 2117   GFRNONAA 57* 05/14/2014 2117   GFRAA 66* 05/14/2014 2117     Recent Labs Lab 05/13/14 1132 05/14/14 2117  LIPASE 21 13   No results found for this basename: AMMONIA,  in the last 168 hours  No results found for this basename: CKTOTAL, CKMB, CKMBINDEX, TROPONINI,  in the last 168 hours BNP (last 3 results) No results found for this basename: PROBNP,  in the last 8760 hours  Radiological Exams on Admission: Dg Abd Acute W/chest  05/14/2014   CLINICAL DATA:  Nausea, vomiting and diarrhea for 2-3 days.  EXAM: ACUTE ABDOMEN SERIES (ABDOMEN 2 VIEW & CHEST 1 VIEW)  COMPARISON:  Chest radiograph from 11/12/2012  FINDINGS: The lungs are well-aerated and clear. There is no evidence of focal opacification, pleural effusion or pneumothorax. The cardiomediastinal silhouette is within normal limits.  The visualized bowel gas pattern is unremarkable. Minimal stool and air are seen within the colon; there is no evidence of small bowel dilatation to suggest obstruction. No free intra-abdominal air is identified on the provided upright view.  No acute osseous abnormalities are seen; the sacroiliac joints are unremarkable in appearance.  IMPRESSION: 1. Unremarkable bowel gas pattern; no free intra-abdominal air seen. 2. No acute cardiopulmonary process identified.   Electronically Signed   By: Roanna Raider M.D.   On: 05/14/2014 23:04    Assessment/Plan Principal Problem:   DKA (diabetic ketoacidoses) Active Problems:   TOBACCO ABUSE   ABDOMINAL PAIN   ANTICARDIOLIPIN ANTIBODY SYNDROME   Polysubstance abuse   Nausea with vomiting   1. DKA (diabetic ketoacidoses) The patient is presenting with complaints of nausea vomiting and diarrhea. Probable etiology is gastroenteritis versus DKA worsening. Patient was seen earlier in the ER and at that time his laboratory parameters were stable. He now appears to have acute kidney injury with  severe acidosis without any lactic acid elevation. He meets the criteria for for DKA and for the severity he will be admitted to step down unit. I would continue monitoring him BMP every 2 hours, I would continue hydrating him with IV fluids, IV insulin with glucose stabilizer, we'll keep him n.p.o. except ice chips and medication. Check hemoglobin A1c in the morning and transition him to Lantus 0.2 units per kilogram once the gap is closed  2.Abdominal pain, nausea vomiting diarrhea Possible viral gastroenteritis versus DKA worsening. He also has history of portal vein thrombosis due to his antiphospholipid antibody syndrome in the past and is currently not on any anticoagulation thus differential also includes venous thrombosis in the abdomen. For which I will obtain CT abdomen pelvis and we'll continue to monitor.  3.Substance abuse. Monitor for withdrawal. Judicious use of pain medications.  As needed Tylenol and tramadol.  4. Antiphospholipid antibody syndrome. Patient was supposed  to be on Coumadin. At present is not taking it and has also had issues with anti-coagulation compliance in the past as well. At present holding off on adding any anticoagulation.  DVT Prophylaxis: subcutaneous Heparin Nutrition: N.p.o. except medication  Code Status: Full  Disposition: Admitted to inpatient in step-down unit.  Author: Lynden Oxford, MD [facility] Pager: [phone] 05/14/2014, 11:34 PM    If 7PM-7AM, please contact night-coverage [email or web address] Password [email or web address]  **Disclaimer: This note may have been dictated with voice recognition software. Similar sounding words can inadvertently be transcribed and this note may contain transcription errors which may not have been corrected upon publication of note.**

## 2014-05-14 NOTE — ED Provider Notes (Signed)
[facility]  History of depression and substance abuse problems.  Pt states he has had peristent N/V/D even while he was in the hospital and came to the ED. Physical Exam  BP 139/55  Pulse 78  Temp(Src) 98 F (36.7 C) (Oral)  Resp 19  SpO2 100%  Physical Exam  Nursing note and vitals reviewed. Constitutional: He appears well-developed and well-nourished. No distress.  HENT:  Head: Normocephalic and atraumatic.  Right Ear: External ear normal.  Left Ear: External ear normal.  Eyes: Conjunctivae are normal. Right eye exhibits no discharge. Left eye exhibits no discharge. No scleral icterus.  Neck: Neck supple. No tracheal deviation present.  Cardiovascular: Normal rate.   Pulmonary/Chest: Effort normal. No stridor. No respiratory distress.  Abdominal: He exhibits no distension and no mass. There is generalized tenderness. There is no rebound and no guarding. No hernia.  Musculoskeletal: He exhibits no edema.  Neurological: He is alert. Cranial nerve deficit: no gross deficits.  Skin: Skin is warm and dry. No rash noted.  Psychiatric: He has a normal mood and affect.    ED Course  Procedures  MDM WIll check labs, xrays.  IV fluids and reassess.  Labs show anion gap acidosis with hypeglycemia.  C/w DKA.  Will start insulin infusion and admit.     Linwood Dibbles, MD 05/14/14 2209

## 2014-05-14 NOTE — Progress Notes (Signed)
Patient ID: Kathan B Eckhardt,ANSLEY STANWOOD 02-01-1971, 43 y.o.   MRN: [id number] S-called by nursing-pt c/o N&V x 2.Diabetic.No insulin HS due to BS 129 No relief from Zofran 4 mg.Marland KitchenPt presented to ED with these symptoms.Zofran ODT 8 mg given-pt c/o no relief.Requests Peptobismol.reports BM< just prior.Thinks BM was "regular". Pain is diffuse.Recheck of sugar was 244.No previous abdominal surgeries  O-Pt lying in bed appearing ill and c/o difuse abdominal pain.Auscultation reveals hypoactive bowel sounds.Percussion reveals diffuse hyper resonance.Palpation reveal soft abdomen with voluntayry guarding and no rebound  A-Diabetic paresis with gas ? Need to r/o more serious possibilities at some point  P-Contacted ED MD.She will come to see pt.Pt to receive sliding scale insulin 8 units

## 2014-05-14 NOTE — ED Notes (Signed)
Pt vomited x 1, comfort measures given. 

## 2014-05-14 NOTE — ED Notes (Signed)
PA Maryjean Morn at bedside to eval pt.  Contacted Dr Wilkie Aye, states to come eval pt.

## 2014-05-14 NOTE — ED Notes (Signed)
06:45 pt continues to complain of abd pain and vomiting.

## 2014-05-14 NOTE — Consult Note (Signed)
John Cox   Reason for Cox:  Suicidal ideation, cocaine abuse Referring Physician:  EDP  John Cox is an 43 y.o. male. Total Time spent with patient: 45 minutes  Assessment: AXIS I:  Depressive Disorder NOS, Substance Abuse and Substance Induced Mood Disorder AXIS II:  Deferred AXIS III:   Past Medical History  Diagnosis Date  . Diabetes mellitus    AXIS IV:  other psychosocial or environmental problems AXIS V:  61-70 mild symptoms  Plan:  No evidence of imminent risk to self or others at present.   Patient does not meet criteria for psychiatric inpatient admission. Supportive therapy provided about ongoing stressors. Discussed crisis plan, support from social network, calling 911, coming to the Emergency Department, and calling Suicide Hotline.  Subjective:   John Cox is a 43 y.o. male patient.  HPI:  Patient states that he was depressed yesterday related to stealing property from his girlfriend.  Patient states that he is tired of hurting his family.  "I may have made a comment to my mom like saying I wish I was dead;No I didn't have a plan or anything like that; it has been over 30 years since I took a overdose."  Patient states that he did not try to hurt himself yesterday and that he "called 911 before it got that far"  At this time patient denies suicidal/homicidal ideation, psychosis, and paranoia. Patient is seeking outpatient services.   HPI Elements:   Location:  substance abuse. Quality:  substance induced mood disorder. Severity:  suicidal ideation. Timing:  1 day.  Past Psychiatric History: Past Medical History  Diagnosis Date  . Diabetes mellitus     reports that he has been smoking.  He does not have any smokeless tobacco history on file. He reports that he drinks alcohol. He reports that he uses illicit drugs (Marijuana) about 3 times per week. No family history on file. Family History Substance Abuse: Yes, Describe:  (THC, alcohol, crack) Family Supports: No Living Arrangements: Other relatives Can pt return to current living arrangement?: Yes   Allergies:  No Known Allergies  ACT Assessment Complete:  Yes:    Educational Status    Risk to Self: Risk to self with the past 6 months Suicidal Ideation: Yes-Currently Present Suicidal Intent: Yes-Currently Present Is patient at risk for suicide?: Yes Suicidal Plan?: Yes-Currently Present Specify Current Suicidal Plan: overdose Access to Means: Yes Specify Access to Suicidal Means: over the counter pills What has been your use of drugs/alcohol within the last 12 months?: THC, alcohol, crack Previous Attempts/Gestures: Yes How many times?: 2 Triggers for Past Attempts:  (Drug use) Intentional Self Injurious Behavior: None Family Suicide History: No Recent stressful life event(s): Loss (Comment);Conflict (Comment);Other (Comment) (relapse) Persecutory voices/beliefs?: No Depression: Yes Depression Symptoms: Guilt;Isolating;Despondent;Loss of interest in usual pleasures;Feeling worthless/self pity;Feeling angry/irritable Substance abuse history and/or treatment for substance abuse?: Yes (UDS positive for cocaine, THC)  Risk to Others: Risk to Others within the past 6 months Homicidal Ideation: Yes-Currently Present Thoughts of Harm to Others: No-Not Currently Present/Within Last 6 Months Current Homicidal Intent: No-Not Currently/Within Last 6 Months Current Homicidal Plan: No-Not Currently/Within Last 6 Months Access to Homicidal Means: No History of harm to others?: No Assessment of Violence: None Noted Does patient have access to weapons?:  (pt mentioned access to pocket knives) Criminal Charges Pending?: Yes Describe Pending Criminal Charges: Pt believes family filed charges for stolen goods Does patient have a court date: No  Abuse:  Prior Inpatient Therapy: Prior Inpatient Therapy Prior Inpatient Therapy: Yes Prior Therapy Dates:  2007 Prior Therapy Facilty/Provider(s): [facility] Reason for Treatment: SI  Prior Outpatient Therapy: Prior Outpatient Therapy Prior Outpatient Therapy: No  Additional Information: Additional Information 1:1 In Past 12 Months?: No CIRT Risk: No Elopement Risk: No Does patient have medical clearance?: Yes                  Objective: Blood pressure 122/61, pulse 73, temperature 98.7 F (37.1 C), temperature source Oral, resp. rate 18, SpO2 100.00%.There is no weight on file to calculate BMI. Results for orders placed during the hospital encounter of 05/13/14 (from the past 72 hour(s))  URINE RAPID DRUG SCREEN (HOSP PERFORMED)     Status: Abnormal   Collection Time    05/13/14 11:04 AM      Result Value Ref Range   Opiates NONE DETECTED  NONE DETECTED   Cocaine POSITIVE (*) NONE DETECTED   Benzodiazepines NONE DETECTED  NONE DETECTED   Amphetamines NONE DETECTED  NONE DETECTED   Tetrahydrocannabinol POSITIVE (*) NONE DETECTED   Barbiturates NONE DETECTED  NONE DETECTED   Comment:            DRUG SCREEN FOR MEDICAL PURPOSES     ONLY.  IF CONFIRMATION IS NEEDED     FOR ANY PURPOSE, NOTIFY LAB     WITHIN 5 DAYS.                LOWEST DETECTABLE LIMITS     FOR URINE DRUG SCREEN     Drug Class       Cutoff (ng/mL)     Amphetamine      1000     Barbiturate      200     Benzodiazepine   546     Tricyclics       568     Opiates          300     Cocaine          300     THC              50  URINALYSIS, ROUTINE W REFLEX MICROSCOPIC     Status: Abnormal   Collection Time    05/13/14 11:04 AM      Result Value Ref Range   Color, Urine YELLOW  YELLOW   APPearance CLEAR  CLEAR   Specific Gravity, Urine 1.035 (*) 1.005 - 1.030   pH 6.5  5.0 - 8.0   Glucose, UA >1000 (*) NEGATIVE mg/dL   Hgb urine dipstick NEGATIVE  NEGATIVE   Bilirubin Urine NEGATIVE  NEGATIVE   Ketones, ur >80 (*) NEGATIVE mg/dL   Protein, ur NEGATIVE  NEGATIVE mg/dL   Urobilinogen, UA 1.0  0.0 - 1.0  mg/dL   Nitrite NEGATIVE  NEGATIVE   Leukocytes, UA NEGATIVE  NEGATIVE  URINE MICROSCOPIC-ADD ON     Status: None   Collection Time    05/13/14 11:04 AM      Result Value Ref Range   RBC / HPF 0-2  <3 RBC/hpf   Sperm, UA PRESENT    CBG MONITORING, ED     Status: Abnormal   Collection Time    05/13/14 11:20 AM      Result Value Ref Range   Glucose-Capillary 247 (*) 70 - 99 mg/dL  COMPREHENSIVE METABOLIC PANEL     Status: Abnormal   Collection Time    05/13/14 11:29 AM  Result Value Ref Range   Sodium 138  137 - 147 mEq/L   Potassium 4.1  3.7 - 5.3 mEq/L   Chloride 98  96 - 112 mEq/L   CO2 25  19 - 32 mEq/L   Glucose, Bld 263 (*) 70 - 99 mg/dL   BUN 16  6 - 23 mg/dL   Creatinine, Ser 1.06  0.50 - 1.35 mg/dL   Calcium 9.1  8.4 - 10.5 mg/dL   Total Protein 5.9 (*) 6.0 - 8.3 g/dL   Albumin 3.3 (*) 3.5 - 5.2 g/dL   AST 21  0 - 37 U/L   ALT 25  0 - 53 U/L   Alkaline Phosphatase 60  39 - 117 U/L   Total Bilirubin 0.8  0.3 - 1.2 mg/dL   GFR calc non Af Amer 84 (*) >90 mL/min   GFR calc Af Amer >90  >90 mL/min   Comment: (NOTE)     The eGFR has been calculated using the CKD EPI equation.     This calculation has not been validated in all clinical situations.     eGFR's persistently <90 mL/min signify possible Chronic Kidney     Disease.   Anion gap 15  5 - 15  ETHANOL     Status: None   Collection Time    05/13/14 11:29 AM      Result Value Ref Range   Alcohol, Ethyl (B) <11  0 - 11 mg/dL   Comment:            LOWEST DETECTABLE LIMIT FOR     SERUM ALCOHOL IS 11 mg/dL     FOR MEDICAL PURPOSES ONLY  ACETAMINOPHEN LEVEL     Status: None   Collection Time    05/13/14 11:29 AM      Result Value Ref Range   Acetaminophen (Tylenol), Serum <15.0  10 - 30 ug/mL   Comment:            THERAPEUTIC CONCENTRATIONS VARY     SIGNIFICANTLY. A RANGE OF 10-30     ug/mL MAY BE AN EFFECTIVE     CONCENTRATION FOR MANY PATIENTS.     HOWEVER, SOME ARE BEST TREATED     AT CONCENTRATIONS  OUTSIDE THIS     RANGE.     ACETAMINOPHEN CONCENTRATIONS     >150 ug/mL AT 4 HOURS AFTER     INGESTION AND >50 ug/mL AT 12     HOURS AFTER INGESTION ARE     OFTEN ASSOCIATED WITH TOXIC     REACTIONS.  SALICYLATE LEVEL     Status: Abnormal   Collection Time    05/13/14 11:29 AM      Result Value Ref Range   Salicylate Lvl <6.1 (*) 2.8 - 20.0 mg/dL  LIPASE, BLOOD     Status: None   Collection Time    05/13/14 11:32 AM      Result Value Ref Range   Lipase 21  11 - 59 U/L  CBC WITH DIFFERENTIAL     Status: Abnormal   Collection Time    05/13/14 11:32 AM      Result Value Ref Range   WBC 8.7  4.0 - 10.5 K/uL   RBC 5.02  4.22 - 5.81 MIL/uL   Hemoglobin 14.3  13.0 - 17.0 g/dL   HCT 42.8  39.0 - 52.0 %   MCV 85.3  78.0 - 100.0 fL   MCH 28.5  26.0 - 34.0 pg   MCHC  33.4  30.0 - 36.0 g/dL   RDW 14.2  11.5 - 15.5 %   Platelets 255  150 - 400 K/uL   Neutrophils Relative % 84 (*) 43 - 77 %   Neutro Abs 7.2  1.7 - 7.7 K/uL   Lymphocytes Relative 12  12 - 46 %   Lymphs Abs 1.1  0.7 - 4.0 K/uL   Monocytes Relative 4  3 - 12 %   Monocytes Absolute 0.4  0.1 - 1.0 K/uL   Eosinophils Relative 0  0 - 5 %   Eosinophils Absolute 0.0  0.0 - 0.7 K/uL   Basophils Relative 0  0 - 1 %   Basophils Absolute 0.0  0.0 - 0.1 K/uL  CBG MONITORING, ED     Status: Abnormal   Collection Time    05/13/14  1:10 PM      Result Value Ref Range   Glucose-Capillary 241 (*) 70 - 99 mg/dL  CBG MONITORING, ED     Status: Abnormal   Collection Time    05/13/14  5:21 PM      Result Value Ref Range   Glucose-Capillary 250 (*) 70 - 99 mg/dL   Comment 1 Documented in Chart     Comment 2 Notify RN    CBG MONITORING, ED     Status: Abnormal   Collection Time    05/13/14  9:30 PM      Result Value Ref Range   Glucose-Capillary 129 (*) 70 - 99 mg/dL  CBG MONITORING, ED     Status: Abnormal   Collection Time    05/14/14  4:49 AM      Result Value Ref Range   Glucose-Capillary 266 (*) 70 - 99 mg/dL  CBG  MONITORING, ED     Status: Abnormal   Collection Time    05/14/14  6:32 AM      Result Value Ref Range   Glucose-Capillary 302 (*) 70 - 99 mg/dL  CBG MONITORING, ED     Status: Abnormal   Collection Time    05/14/14  8:11 AM      Result Value Ref Range   Glucose-Capillary 315 (*) 70 - 99 mg/dL  CBG MONITORING, ED     Status: Abnormal   Collection Time    05/14/14 12:40 PM      Result Value Ref Range   Glucose-Capillary 120 (*) 70 - 99 mg/dL   Labs are reviewed see above values.   Medications reviewed and no changes made Current Facility-Administered Medications  Medication Dose Route Frequency Provider Last Rate Last Dose  . acetaminophen (TYLENOL) tablet 650 mg  650 mg Oral Q4H PRN Stephani Police Piepenbrink, PA-C   650 mg at 05/14/14 0354  . alum & mag hydroxide-simeth (MAALOX/MYLANTA) 200-200-20 MG/5ML suspension 30 mL  30 mL Oral PRN Jennifer L Piepenbrink, PA-C      . ibuprofen (ADVIL,MOTRIN) tablet 600 mg  600 mg Oral Q8H PRN Jennifer L Piepenbrink, PA-C      . insulin aspart (novoLOG) injection 0-15 Units  0-15 Units Subcutaneous TID WC Jennifer L Piepenbrink, PA-C   11 Units at 05/14/14 0836  . insulin aspart (novoLOG) injection 0-5 Units  0-5 Units Subcutaneous QHS Jennifer L Piepenbrink, PA-C      . ondansetron (ZOFRAN-ODT) disintegrating tablet 8 mg  8 mg Oral Q12H Dara Hoyer, PA-C      . promethazine (PHENERGAN) tablet 25 mg  25 mg Oral Once Merryl Hacker, MD      .  sertraline (ZOLOFT) tablet 50 mg  50 mg Oral Daily Waylan Boga, NP   50 mg at 05/14/14 1142  . zolpidem (AMBIEN) tablet 5 mg  5 mg Oral QHS PRN Harlow Mares, PA-C       Current Outpatient Prescriptions  Medication Sig Dispense Refill  . ibuprofen (ADVIL,MOTRIN) 200 MG tablet Take 600 mg by mouth every 6 (six) hours as needed for mild pain.        Psychiatric Specialty Exam:     Blood pressure 122/61, pulse 73, temperature 98.7 F (37.1 C), temperature source Oral, resp. rate 18, SpO2  100.00%.There is no weight on file to calculate BMI.  General Appearance: Casual  Eye Contact::  Good  Speech:  Clear and Coherent and Normal Rate  Volume:  Normal  Mood:  Depressed  Affect:  Congruent  Thought Process:  Circumstantial and Goal Directed  Orientation:  Full (Time, Place, and Person)  Thought Content:  WDL  Suicidal Thoughts:  No  Homicidal Thoughts:  No  Memory:  Immediate;   Good Recent;   Good Remote;   Good  Judgement:  Intact  Insight:  Present  Psychomotor Activity:  Normal  Concentration:  Fair  Recall:  Good  Fund of Knowledge:Good  Language: Good  Akathisia:  No  Handed:  Right  AIMS (if indicated):     Assets:  Communication Skills Desire for Improvement Housing Physical Health Social Support  Sleep:      Musculoskeletal: Strength & Muscle Tone: within normal limits Gait & Station: normal Patient leans: Right  Treatment Plan Summary: Discharge home.  Patient to follow up with Nicholaus Bloom, Shuvon, FNP-BC 05/14/2014 1:23 PM  Patient is in face-to-face for psychiatric evaluation, case discussed with the treatment team and a physician extender and formulated treatment plan.Reviewed the information documented and agree with the treatment plan.  Lyden Redner,JANARDHAHA R. 05/14/2014 5:45 PM

## 2014-05-14 NOTE — ED Notes (Signed)
D/C instructions and prescription reviewed with understanding stated. Encouraged to see a PCP or urgent care for diabetes management. Denies SI, HI and AVH. Denies pain. Complaint of feeling weak. Ambulatory without difficulty. [employer] here to escort out.

## 2014-05-14 NOTE — BH Assessment (Signed)
John Cox, [facility] at [facility], confirmed adult unit is at capacity. Contacted the following facilities for placement:   BED AVAILABLE, CLINICAL INFORMATION HAS BEEN RECEIVED:  [name], per [name]   BED AVAILABLE, CLINICAL INFORMATION FAXED: [facility], per [employer]   AT CAPACITY:  [name], per [facility], per [name], per [facility], per [name], per [name], per [facility], per [facility], per [facility], per [facility], per [place], per [facility], per [facility], per [name]  [name], per [facility], per [facility], per [name]   MULTIPLE ATTEMPTS TO CONTACT WITH NO RESPONSE:  [facility]    [address] [name], [place], [facility]  Triage Specialist  [phone]

## 2014-05-14 NOTE — ED Provider Notes (Signed)
CSN: [id number]     Arrival date & time 05/14/14  2032 History   First MD Initiated Contact with Patient 05/14/14 2059     Chief Complaint  Patient presents with  . Emesis  . Diarrhea     (Consider location/radiation/quality/duration/timing/severity/associated sxs/prior Treatment) Patient is a 43 y.o. male presenting with vomiting and diarrhea.  Emesis Associated symptoms: abdominal pain, chills, diarrhea and headaches   Diarrhea Associated symptoms: abdominal pain, chills, headaches and vomiting    Pt is a 43 y/o male w/ PMHx of DM, polysubstance abuse, and depression who presents to the ED w/ vomiting, diarrhea, and abdominal pain x 2-3 days. Pt was last seen in the ED on 9/12 with the same symptoms. CMP revealed glucose of 263 and UA revealed ketones and glucose. He was having suicidal ideations at that time and was admitted to behavioral health. He was discharged this evening and was home for only 2-3 hours. Pt states that on discharge he was having the same symptoms and not tolerating a diet. He went home and his mother called EMS to bring patient back to the ED. Pt state he had 10 episodes of vomiting and 6 BMs today. He does not know if there was blood in them. He does not know the last time he took insulin, and he has not checked his blood sugars while he was at home. He rates abdominal pain as 6/10 in severity and his headache as 6/10 in severity. He denies recent drug or EtOH use since 9/12. He does not have any suicidal ideation at this time.   Past Medical History  Diagnosis Date  . Diabetes mellitus   . Depression   . Suicidal ideation   . Nausea & vomiting   . Polysubstance abuse    History reviewed. No pertinent past surgical history. History reviewed. No pertinent family history. History  Substance Use Topics  . Smoking status: Current Every Day Smoker -- 1.00 packs/day  . Smokeless tobacco: Not on file  . Alcohol Use: Yes     Comment: 12 pk on w/e    Review of  Systems  Constitutional: Positive for chills.  Respiratory: Negative for shortness of breath.   Cardiovascular: Positive for chest pain.  Gastrointestinal: Positive for vomiting, abdominal pain and diarrhea.  Neurological: Positive for headaches.  Psychiatric/Behavioral: Negative for suicidal ideas.      Allergies  Review of patient's allergies indicates no known allergies.  Home Medications   Prior to Admission medications   Medication Sig Start Date End Date Taking? Authorizing Provider  ibuprofen (ADVIL,MOTRIN) 200 MG tablet Take 600 mg by mouth every 6 (six) hours as needed for mild pain.    Historical Provider, MD  insulin aspart (NOVOLOG) 100 UNIT/ML injection Inject 0-5 Units into the skin at bedtime. 05/14/14   Shuvon Rankin, NP  sertraline (ZOLOFT) 50 MG tablet Take 1 tablet (50 mg total) by mouth daily. 05/14/14   Shuvon Rankin, NP   BP 139/55  Pulse 78  Temp(Src) 98 F (36.7 C) (Oral)  Resp 19  SpO2 100% Physical Exam  HENT:  Chapped lips   Eyes: Pupils are equal, round, and reactive to light.  muddly sclera   Cardiovascular: Normal rate and regular rhythm.   Thrill at lateral chest border   Pulmonary/Chest: Effort normal and breath sounds normal. He has no wheezes.  Abdominal: Soft. Bowel sounds are normal. He exhibits no mass. There is tenderness (generalized).  Skin: He is not diaphoretic.    ED Course  Procedures (including critical care time) Labs Review Labs Reviewed  COMPREHENSIVE METABOLIC PANEL - Abnormal; Notable for the following:    Sodium 136 (*)    Potassium 5.7 (*)    Chloride 94 (*)    CO2 11 (*)    Glucose, Bld 447 (*)    BUN 26 (*)    Creatinine, Ser 1.45 (*)    GFR calc non Af Amer 57 (*)    GFR calc Af Amer 66 (*)    Anion gap 31 (*)    All other components within normal limits  CBC - Abnormal; Notable for the following:    WBC 13.9 (*)    All other components within normal limits  URINALYSIS, ROUTINE W REFLEX MICROSCOPIC -  Abnormal; Notable for the following:    Glucose, UA >1000 (*)    Ketones, ur >80 (*)    All other components within normal limits  BLOOD GAS, ARTERIAL - Abnormal; Notable for the following:    pH, Arterial 7.206 (*)    pCO2 arterial 24.6 (*)    pO2, Arterial 110.0 (*)    Bicarbonate 9.4 (*)    Acid-base deficit 17.3 (*)    All other components within normal limits  CBG MONITORING, ED - Abnormal; Notable for the following:    Glucose-Capillary 391 (*)    All other components within normal limits  LIPASE, BLOOD  URINE MICROSCOPIC-ADD ON  LACTIC ACID, PLASMA    Imaging Review Pending.    MDM   Final diagnoses:  Diabetic ketoacidosis without coma associated with type 1 diabetes mellitus    Pt is a 43 y/o male who presents to the ED for vomiting, diarrhea, and abdominal pain x 2-3 days. Pt was recently d/c from [facility] behavioral health today and is unsure of what medications he received during his stay. He reports that he had the same symptoms during his admission. CBG was elevated at 391. DDx include gastroenteritis, DKA, bowel obstruction, and pancreatitis. Will proceed w/ CBC, CMP, UA, CXR, and lipase.   CMP reveals glucose of 447 and AG of 31. Will give 2L NS bolus and start glucostabilizer. Will consult hospitalist for admission.     Gara Kroner, MD 05/14/14 2241

## 2014-05-14 NOTE — BHH Suicide Risk Assessment (Cosign Needed)
Suicide Risk Assessment  Discharge Assessment     Demographic Factors:  Male and black  Total Time spent with patient: 30 minutes  Psychiatric Specialty Exam:     Blood pressure 122/61, pulse 73, temperature 98.7 F (37.1 C), temperature source Oral, resp. rate 18, SpO2 100.00%.There is no weight on file to calculate BMI.   General Appearance: Casual   Eye Contact:: Good   Speech: Clear and Coherent and Normal Rate   Volume: Normal   Mood: Depressed   Affect: Congruent   Thought Process: Circumstantial and Goal Directed   Orientation: Full (Time, Place, and Person)   Thought Content: WDL   Suicidal Thoughts: No   Homicidal Thoughts: No   Memory: Immediate; Good  Recent; Good  Remote; Good   Judgement: Intact   Insight: Present   Psychomotor Activity: Normal   Concentration: Fair   Recall: Good   Fund of Knowledge:Good   Language: Good   Akathisia: No   Handed: Right   AIMS (if indicated):   Assets: Communication Skills  Desire for Improvement  Housing  Physical Health  Social Support   Sleep:   Musculoskeletal:  Strength & Muscle Tone: within normal limits  Gait & Station: normal  Patient leans: Right     Mental Status Per Nursing Assessment::   On Admission:     Current Mental Status by Physician: Patient denies suicidal/homicidal ideation, psychosis, and paranoia  Loss Factors: NA  Historical Factors: NA  Risk Reduction Factors:   Sense of responsibility to family and Positive social support  Continued Clinical Symptoms:  Alcohol/Substance Abuse/Dependencies  Cognitive Features That Contribute To Risk:  None noted    Suicide Risk:  Minimal: No identifiable suicidal ideation.  Patients presenting with no risk factors but with morbid ruminations; may be classified as minimal risk based on the severity of the depressive symptoms  Discharge Diagnoses:  AXIS I: Depressive Disorder NOS, Substance Abuse and Substance Induced Mood Disorder  AXIS  II: Deferred  AXIS III:  Past Medical History   Diagnosis  Date   .  Diabetes mellitus     AXIS IV: other psychosocial or environmental problems  AXIS V: 61-70 mild symptoms      Plan Of Care/Follow-up recommendations:  Activity:  as tolerated Diet:  as tolerated  Is patient on multiple antipsychotic therapies at discharge:  No   Has Patient had three or more failed trials of antipsychotic monotherapy by history:  No  Recommended Plan for Multiple Antipsychotic Therapies: NA    Rankin, Shuvon, FNP-BC 05/14/2014, 1:48 PM

## 2014-05-14 NOTE — Discharge Instructions (Signed)
Chemical Dependency Chemical dependency is an addiction to drugs or alcohol. It is characterized by the repeated behavior of seeking out and using drugs and alcohol despite harmful consequences to the health and safety of ones self and others.  RISK FACTORS There are certain situations or behaviors that increase a person's risk for chemical dependency. These include:  A family history of chemical dependency.  A history of mental health issues, including depression and anxiety.  A home environment where drugs and alcohol are easily available to you.  Drug or alcohol use at a young age. SYMPTOMS  The following symptoms can indicate chemical dependency:  Inability to limit the use of drugs or alcohol.  Nausea, sweating, shakiness, and anxiety that occurs when alcohol or drugs are not being used.  An increase in amount of drugs or alcohol that is necessary to get drunk or high. People who experience these symptoms can assess their use of drugs and alcohol by asking themselves the following questions:  Have you been told by friends or family that they are worried about your use of alcohol or drugs?  Do friends and family ever tell you about things you did while drinking alcohol or using drugs that you do not remember?  Do you lie about using alcohol or drugs or about the amounts you use?  Do you have difficulty completing daily tasks unless you use alcohol or drugs?  Is the level of your work or school performance lower because of your drug or alcohol use?  Do you get sick from using drugs or alcohol but keep using anyway?  Do you feel uncomfortable in social situations unless you use alcohol or drugs?  Do you use drugs or alcohol to help forget problems? An answer of yes to any of these questions may indicate chemical dependency. Professional evaluation is suggested. Document Released: 08/12/2001 Document Revised: 11/10/2011 Document Reviewed: 10/24/2010 [facility] Patient  Information 2015 [place], [place]. This information is not intended to replace advice given to you by your health care provider. Make sure you discuss any questions you have with your health care provider.  Depression Depression refers to feeling sad, low, down in the dumps, blue, gloomy, or empty. In general, there are two kinds of depression: 1. Normal sadness or normal grief. This kind of depression is one that we all feel from time to time after upsetting life experiences, such as the loss of a job or the ending of a relationship. This kind of depression is considered normal, is short lived, and resolves within a few days to 2 weeks. Depression experienced after the loss of a loved one (bereavement) often lasts longer than 2 weeks but normally gets better with time. 2. Clinical depression. This kind of depression lasts longer than normal sadness or normal grief or interferes with your ability to function at home, at work, and in school. It also interferes with your personal relationships. It affects almost every aspect of your life. Clinical depression is an illness. Symptoms of depression can also be caused by conditions other than those mentioned above, such as:  Physical illness. Some physical illnesses, including underactive thyroid gland (hypothyroidism), severe anemia, specific types of cancer, diabetes, uncontrolled seizures, heart and lung problems, strokes, and chronic pain are commonly associated with symptoms of depression.  Side effects of some prescription medicine. In some people, certain types of medicine can cause symptoms of depression.  Substance abuse. Abuse of alcohol and illicit drugs can cause symptoms of depression. SYMPTOMS Symptoms of normal sadness  and normal grief include the following:  Feeling sad or crying for short periods of time.  Not caring about anything (apathy).  Difficulty sleeping or sleeping too much.  No longer able to enjoy the things you used to  enjoy.  Desire to be by oneself all the time (social isolation).  Lack of energy or motivation.  Difficulty concentrating or remembering.  Change in appetite or weight.  Restlessness or agitation. Symptoms of clinical depression include the same symptoms of normal sadness or normal grief and also the following symptoms:  Feeling sad or crying all the time.  Feelings of guilt or worthlessness.  Feelings of hopelessness or helplessness.  Thoughts of suicide or the desire to harm yourself (suicidal ideation).  Loss of touch with reality (psychotic symptoms). Seeing or hearing things that are not real (hallucinations) or having false beliefs about your life or the people around you (delusions and paranoia). DIAGNOSIS  The diagnosis of clinical depression is usually based on how bad the symptoms are and how long they have lasted. Your health care provider will also ask you questions about your medical history and substance use to find out if physical illness, use of prescription medicine, or substance abuse is causing your depression. Your health care provider may also order blood tests. TREATMENT  Often, normal sadness and normal grief do not require treatment. However, sometimes antidepressant medicine is given for bereavement to ease the depressive symptoms until they resolve. The treatment for clinical depression depends on how bad the symptoms are but often includes antidepressant medicine, counseling with a mental health professional, or both. Your health care provider will help to determine what treatment is best for you. Depression caused by physical illness usually goes away with appropriate medical treatment of the illness. If prescription medicine is causing depression, talk with your health care provider about stopping the medicine, decreasing the dose, or changing to another medicine. Depression caused by the abuse of alcohol or illicit drugs goes away when you stop using these  substances. Some adults need professional help in order to stop drinking or using drugs. SEEK IMMEDIATE MEDICAL CARE IF:  You have thoughts about hurting yourself or others.  You lose touch with reality (have psychotic symptoms).  You are taking medicine for depression and have a serious side effect. FOR MORE INFORMATION  National Alliance on Mental Illness: www.nami.[employer] of Mental Health: http://www.maynard.net/ Document Released: 08/15/2000 Document Revised: 01/02/2014 Document Reviewed: 11/17/2011 [facility] Patient Information 2015 [place], [place]. This information is not intended to replace advice given to you by your health care provider. Make sure you discuss any questions you have with your health care provider.  Diabetes Mellitus and Food It is important for you to manage your blood sugar (glucose) level. Your blood glucose level can be greatly affected by what you eat. Eating healthier foods in the appropriate amounts throughout the day at about the same time each day will help you control your blood glucose level. It can also help slow or prevent worsening of your diabetes mellitus. Healthy eating may even help you improve the level of your blood pressure and reach or maintain a healthy weight.  HOW CAN FOOD AFFECT ME? Carbohydrates Carbohydrates affect your blood glucose level more than any other type of food. Your dietitian will help you determine how many carbohydrates to eat at each meal and teach you how to count carbohydrates. Counting carbohydrates is important to keep your blood glucose at a healthy level, especially if you are  using insulin or taking certain medicines for diabetes mellitus. Alcohol Alcohol can cause sudden decreases in blood glucose (hypoglycemia), especially if you use insulin or take certain medicines for diabetes mellitus. Hypoglycemia can be a life-threatening condition. Symptoms of hypoglycemia (sleepiness, dizziness, and disorientation) are  similar to symptoms of having too much alcohol.  If your health care provider has given you approval to drink alcohol, do so in moderation and use the following guidelines:  Women should not have more than one drink per day, and men should not have more than two drinks per day. One drink is equal to:  12 oz of beer.  5 oz of wine.  1 oz of hard liquor.  Do not drink on an empty stomach.  Keep yourself hydrated. Have water, diet soda, or unsweetened iced tea.  Regular soda, juice, and other mixers might contain a lot of carbohydrates and should be counted. WHAT FOODS ARE NOT RECOMMENDED? As you make food choices, it is important to remember that all foods are not the same. Some foods have fewer nutrients per serving than other foods, even though they might have the same number of calories or carbohydrates. It is difficult to get your body what it needs when you eat foods with fewer nutrients. Examples of foods that you should avoid that are high in calories and carbohydrates but low in nutrients include:  Trans fats (most processed foods list trans fats on the Nutrition Facts label).  Regular soda.  Juice.  Candy.  Sweets, such as cake, pie, doughnuts, and cookies.  Fried foods. WHAT FOODS CAN I EAT? Have nutrient-rich foods, which will nourish your body and keep you healthy. The food you should eat also will depend on several factors, including:  The calories you need.  The medicines you take.  Your weight.  Your blood glucose level.  Your blood pressure level.  Your cholesterol level. You also should eat a variety of foods, including:  Protein, such as meat, poultry, fish, tofu, nuts, and seeds (lean animal proteins are best).  Fruits.  Vegetables.  Dairy products, such as milk, cheese, and yogurt (low fat is best).  Breads, grains, pasta, cereal, rice, and beans.  Fats such as olive oil, trans fat-free margarine, canola oil, avocado, and olives. DOES  EVERYONE WITH DIABETES MELLITUS HAVE THE SAME MEAL PLAN? Because every person with diabetes mellitus is different, there is not one meal plan that works for everyone. It is very important that you meet with a dietitian who will help you create a meal plan that is just right for you. Document Released: 05/15/2005 Document Revised: 08/23/2013 Document Reviewed: 07/15/2013 [facility] Patient Information 2015 [place], [place]. This information is not intended to replace advice given to you by your health care provider. Make sure you discuss any questions you have with your health care provider.

## 2014-05-14 NOTE — ED Notes (Signed)
Pt vomited x 1.  Comfort measures given. 

## 2014-05-14 NOTE — ED Provider Notes (Signed)
Medical screening examination/treatment/procedure(s) were performed by non-physician practitioner and as supervising physician I was immediately available for consultation/collaboration.  Megan Docherty, MD 05/14/14 1911 

## 2014-05-14 NOTE — ED Notes (Signed)
EMS called to home.  Found patient with complaints of nausea, vomiting and diarrhea. Patient was seen at [facility] on 9/12 for same with multiple other complaints.

## 2014-05-14 NOTE — ED Notes (Signed)
Dr.Steinal notified of vomiting overnight with CBGs continuing to increase although he is unable to eat. Advised to cover CBG with appropriate sliding scale. Stated he would come to assess.

## 2014-05-15 ENCOUNTER — Inpatient Hospital Stay (HOSPITAL_COMMUNITY): Payer: Self-pay

## 2014-05-15 DIAGNOSIS — R112 Nausea with vomiting, unspecified: Secondary | ICD-10-CM | POA: Diagnosis present

## 2014-05-15 LAB — BASIC METABOLIC PANEL
ANION GAP: 19 — AB (ref 5–15)
ANION GAP: 21 — AB (ref 5–15)
ANION GAP: 17 — AB (ref 5–15)
Anion gap: 28 — ABNORMAL HIGH (ref 5–15)
Anion gap: 24 — ABNORMAL HIGH (ref 5–15)
Anion gap: 18 — ABNORMAL HIGH (ref 5–15)
Anion gap: 15 (ref 5–15)
Anion gap: 15 (ref 5–15)
BUN: 26 mg/dL — ABNORMAL HIGH (ref 6–23)
BUN: 21 mg/dL (ref 6–23)
BUN: 21 mg/dL (ref 6–23)
BUN: 25 mg/dL — ABNORMAL HIGH (ref 6–23)
BUN: 23 mg/dL (ref 6–23)
BUN: 19 mg/dL (ref 6–23)
BUN: 19 mg/dL (ref 6–23)
BUN: 18 mg/dL (ref 6–23)
CALCIUM: 8.2 mg/dL — AB (ref 8.4–10.5)
CALCIUM: 8.1 mg/dL — AB (ref 8.4–10.5)
CALCIUM: 8.2 mg/dL — AB (ref 8.4–10.5)
CALCIUM: 8.7 mg/dL (ref 8.4–10.5)
CALCIUM: 8.5 mg/dL (ref 8.4–10.5)
CALCIUM: 8.3 mg/dL — AB (ref 8.4–10.5)
CHLORIDE: 97 meq/L (ref 96–112)
CO2: 12 mEq/L — ABNORMAL LOW (ref 19–32)
CO2: 16 mEq/L — ABNORMAL LOW (ref 19–32)
CO2: 16 meq/L — AB (ref 19–32)
CO2: 18 meq/L — AB (ref 19–32)
CO2: 18 meq/L — AB (ref 19–32)
CO2: 19 mEq/L (ref 19–32)
CO2: 19 mEq/L (ref 19–32)
CO2: 9 meq/L — AB (ref 19–32)
CREATININE: 1.41 mg/dL — AB (ref 0.50–1.35)
CREATININE: 1.39 mg/dL — AB (ref 0.50–1.35)
CREATININE: 1.28 mg/dL (ref 0.50–1.35)
Calcium: 8.3 mg/dL — ABNORMAL LOW (ref 8.4–10.5)
Calcium: 8.1 mg/dL — ABNORMAL LOW (ref 8.4–10.5)
Chloride: 105 mEq/L (ref 96–112)
Chloride: 105 mEq/L (ref 96–112)
Chloride: 105 mEq/L (ref 96–112)
Chloride: 105 mEq/L (ref 96–112)
Chloride: 98 mEq/L (ref 96–112)
Chloride: 99 mEq/L (ref 96–112)
Chloride: 100 mEq/L (ref 96–112)
Creatinine, Ser: 1.37 mg/dL — ABNORMAL HIGH (ref 0.50–1.35)
Creatinine, Ser: 1.38 mg/dL — ABNORMAL HIGH (ref 0.50–1.35)
Creatinine, Ser: 1.42 mg/dL — ABNORMAL HIGH (ref 0.50–1.35)
Creatinine, Ser: 1.38 mg/dL — ABNORMAL HIGH (ref 0.50–1.35)
Creatinine, Ser: 1.27 mg/dL (ref 0.50–1.35)
GFR calc Af Amer: 71 mL/min — ABNORMAL LOW (ref >90)
GFR calc Af Amer: 71 mL/min — ABNORMAL LOW (ref >90)
GFR calc Af Amer: 68 mL/min — ABNORMAL LOW (ref >90)
GFR calc Af Amer: 69 mL/min — ABNORMAL LOW (ref >90)
GFR calc Af Amer: 70 mL/min — ABNORMAL LOW (ref >90)
GFR calc Af Amer: 77 mL/min — ABNORMAL LOW (ref >90)
GFR calc non Af Amer: 61 mL/min — ABNORMAL LOW (ref >90)
GFR calc non Af Amer: 61 mL/min — ABNORMAL LOW (ref >90)
GFR calc non Af Amer: 59 mL/min — ABNORMAL LOW (ref >90)
GFR calc non Af Amer: 61 mL/min — ABNORMAL LOW (ref >90)
GFR, EST AFRICAN AMERICAN: 71 mL/min — AB (ref >90)
GFR, EST AFRICAN AMERICAN: 78 mL/min — AB (ref >90)
GFR, EST NON AFRICAN AMERICAN: 59 mL/min — AB (ref >90)
GFR, EST NON AFRICAN AMERICAN: 60 mL/min — AB (ref >90)
GFR, EST NON AFRICAN AMERICAN: 67 mL/min — AB (ref >90)
GFR, EST NON AFRICAN AMERICAN: 67 mL/min — AB (ref >90)
GLUCOSE: 216 mg/dL — AB (ref 70–99)
GLUCOSE: 163 mg/dL — AB (ref 70–99)
GLUCOSE: 121 mg/dL — AB (ref 70–99)
GLUCOSE: 88 mg/dL (ref 70–99)
Glucose, Bld: 379 mg/dL — ABNORMAL HIGH (ref 70–99)
Glucose, Bld: 206 mg/dL — ABNORMAL HIGH (ref 70–99)
Glucose, Bld: 145 mg/dL — ABNORMAL HIGH (ref 70–99)
Glucose, Bld: 184 mg/dL — ABNORMAL HIGH (ref 70–99)
POTASSIUM: 6.2 meq/L — AB (ref 3.7–5.3)
Potassium: 4.6 mEq/L (ref 3.7–5.3)
Potassium: 4.4 mEq/L (ref 3.7–5.3)
Potassium: 4.8 mEq/L (ref 3.7–5.3)
Potassium: 4.3 mEq/L (ref 3.7–5.3)
Potassium: 5 mEq/L (ref 3.7–5.3)
Potassium: 4.3 mEq/L (ref 3.7–5.3)
Potassium: 3.9 mEq/L (ref 3.7–5.3)
SODIUM: 134 meq/L — AB (ref 137–147)
SODIUM: 135 meq/L — AB (ref 137–147)
SODIUM: 136 meq/L — AB (ref 137–147)
SODIUM: 139 meq/L (ref 137–147)
SODIUM: 139 meq/L (ref 137–147)
SODIUM: 139 meq/L (ref 137–147)
SODIUM: 141 meq/L (ref 137–147)
Sodium: 135 mEq/L — ABNORMAL LOW (ref 137–147)

## 2014-05-15 LAB — GLUCOSE, CAPILLARY
GLUCOSE-CAPILLARY: 143 mg/dL — AB (ref 70–99)
GLUCOSE-CAPILLARY: 116 mg/dL — AB (ref 70–99)
GLUCOSE-CAPILLARY: 92 mg/dL (ref 70–99)
GLUCOSE-CAPILLARY: 204 mg/dL — AB (ref 70–99)
GLUCOSE-CAPILLARY: 213 mg/dL — AB (ref 70–99)
Glucose-Capillary: 245 mg/dL — ABNORMAL HIGH (ref 70–99)
Glucose-Capillary: 206 mg/dL — ABNORMAL HIGH (ref 70–99)
Glucose-Capillary: 191 mg/dL — ABNORMAL HIGH (ref 70–99)
Glucose-Capillary: 134 mg/dL — ABNORMAL HIGH (ref 70–99)
Glucose-Capillary: 86 mg/dL (ref 70–99)
Glucose-Capillary: 65 mg/dL — ABNORMAL LOW (ref 70–99)
Glucose-Capillary: 124 mg/dL — ABNORMAL HIGH (ref 70–99)
Glucose-Capillary: 170 mg/dL — ABNORMAL HIGH (ref 70–99)
Glucose-Capillary: 149 mg/dL — ABNORMAL HIGH (ref 70–99)
Glucose-Capillary: 121 mg/dL — ABNORMAL HIGH (ref 70–99)
Glucose-Capillary: 185 mg/dL — ABNORMAL HIGH (ref 70–99)
Glucose-Capillary: 193 mg/dL — ABNORMAL HIGH (ref 70–99)
Glucose-Capillary: 184 mg/dL — ABNORMAL HIGH (ref 70–99)

## 2014-05-15 LAB — CBC WITH DIFFERENTIAL/PLATELET
BASOS ABS: 0 10*3/uL (ref 0.0–0.1)
Basophils Relative: 0 % (ref 0–1)
EOS ABS: 0 10*3/uL (ref 0.0–0.7)
EOS PCT: 0 % (ref 0–5)
HCT: 38.1 % — ABNORMAL LOW (ref 39.0–52.0)
Hemoglobin: 12.8 g/dL — ABNORMAL LOW (ref 13.0–17.0)
Lymphocytes Relative: 12 % (ref 12–46)
Lymphs Abs: 2.1 10*3/uL (ref 0.7–4.0)
MCH: 28.3 pg (ref 26.0–34.0)
MCHC: 33.6 g/dL (ref 30.0–36.0)
MCV: 84.3 fL (ref 78.0–100.0)
MONO ABS: 1.2 10*3/uL — AB (ref 0.1–1.0)
Monocytes Relative: 7 % (ref 3–12)
Neutro Abs: 14.5 10*3/uL — ABNORMAL HIGH (ref 1.7–7.7)
Neutrophils Relative %: 81 % — ABNORMAL HIGH (ref 43–77)
Platelets: 277 10*3/uL (ref 150–400)
RBC: 4.52 MIL/uL (ref 4.22–5.81)
RDW: 14.4 % (ref 11.5–15.5)
WBC: 17.9 10*3/uL — ABNORMAL HIGH (ref 4.0–10.5)

## 2014-05-15 LAB — CBG MONITORING, ED
GLUCOSE-CAPILLARY: 316 mg/dL — AB (ref 70–99)
Glucose-Capillary: 262 mg/dL — ABNORMAL HIGH (ref 70–99)

## 2014-05-15 LAB — MRSA PCR SCREENING: MRSA BY PCR: NEGATIVE

## 2014-05-15 LAB — PROTIME-INR
INR: 1.14 (ref 0.00–1.49)
Prothrombin Time: 14.6 seconds (ref 11.6–15.2)

## 2014-05-15 LAB — HEMOGLOBIN A1C
HEMOGLOBIN A1C: 10.2 % — AB (ref <5.7)
Mean Plasma Glucose: 246 mg/dL — ABNORMAL HIGH (ref <117)

## 2014-05-15 LAB — APTT: aPTT: 22 seconds — ABNORMAL LOW (ref 24–37)

## 2014-05-15 LAB — KETONES, QUALITATIVE

## 2014-05-15 MED ORDER — IOHEXOL 300 MG/ML  SOLN
50.0000 mL | Freq: Once | INTRAMUSCULAR | Status: AC | PRN
  Administered 2014-05-15: 50 mL via ORAL

## 2014-05-15 MED ORDER — PROMETHAZINE HCL 25 MG/ML IJ SOLN
12.5000 mg | Freq: Four times a day (QID) | INTRAMUSCULAR | Status: DC | PRN
  Administered 2014-05-15: 25 mg via INTRAVENOUS
  Filled 2014-05-15: qty 1

## 2014-05-15 MED ORDER — ACETAMINOPHEN 325 MG PO TABS: 650.0000 mg | ORAL_TABLET | Freq: Four times a day (QID) | ORAL | Status: DC | PRN

## 2014-05-15 MED ORDER — SODIUM CHLORIDE 0.9 % IV SOLN
INTRAVENOUS | Status: DC
  Administered 2014-05-15: 09:00:00 via INTRAVENOUS

## 2014-05-15 MED ORDER — SODIUM BICARBONATE 8.4 % IV SOLN
50.0000 meq | Freq: Once | INTRAVENOUS | Status: AC
  Administered 2014-05-15: 50 meq via INTRAVENOUS
  Filled 2014-05-15: qty 50

## 2014-05-15 MED ORDER — PANTOPRAZOLE SODIUM 40 MG IV SOLR
40.0000 mg | Freq: Two times a day (BID) | INTRAVENOUS | Status: DC
  Administered 2014-05-15 – 2014-05-17 (×4): 40 mg via INTRAVENOUS
  Filled 2014-05-15 (×4): qty 40

## 2014-05-15 MED ORDER — IOHEXOL 300 MG/ML  SOLN
100.0000 mL | Freq: Once | INTRAMUSCULAR | Status: AC | PRN
  Administered 2014-05-15: 100 mL via INTRAVENOUS

## 2014-05-15 MED ORDER — INSULIN REGULAR BOLUS VIA INFUSION
0.0000 [IU] | Freq: Three times a day (TID) | INTRAVENOUS | Status: DC
  Filled 2014-05-15: qty 10

## 2014-05-15 MED ORDER — CALCIUM GLUCONATE 10 % IV SOLN
1.0000 g | Freq: Once | INTRAVENOUS | Status: AC
  Administered 2014-05-15: 1 g via INTRAVENOUS
  Filled 2014-05-15: qty 10

## 2014-05-15 MED ORDER — ONDANSETRON HCL 4 MG/2ML IJ SOLN
4.0000 mg | Freq: Four times a day (QID) | INTRAMUSCULAR | Status: DC | PRN
  Administered 2014-05-15: 4 mg via INTRAVENOUS
  Filled 2014-05-15 (×2): qty 2

## 2014-05-15 MED ORDER — TRAMADOL HCL 50 MG PO TABS
50.0000 mg | ORAL_TABLET | Freq: Four times a day (QID) | ORAL | Status: DC | PRN
  Administered 2014-05-15 – 2014-05-18 (×4): 50 mg via ORAL
  Filled 2014-05-15 (×4): qty 1

## 2014-05-15 MED ORDER — INFLUENZA VAC SPLIT QUAD 0.5 ML IM SUSY
0.5000 mL | PREFILLED_SYRINGE | INTRAMUSCULAR | Status: AC
  Administered 2014-05-16: 0.5 mL via INTRAMUSCULAR
  Filled 2014-05-15 (×2): qty 0.5

## 2014-05-15 MED ORDER — PNEUMOCOCCAL VAC POLYVALENT 25 MCG/0.5ML IJ INJ
0.5000 mL | INJECTION | INTRAMUSCULAR | Status: AC
  Administered 2014-05-16: 0.5 mL via INTRAMUSCULAR
  Filled 2014-05-15 (×2): qty 0.5

## 2014-05-15 MED ORDER — DEXTROSE-NACL 5-0.45 % IV SOLN: INTRAVENOUS | Status: DC

## 2014-05-15 NOTE — Progress Notes (Addendum)
[facility] PROGRESS NOTE  John Cox ZOX:[id number] DOB: [birth year] DOA: 05/14/2014 PCP: Default, Provider, MD  Assessment/Plan  DKA (diabetic ketoacidoses), improving but not resolved -  Continue insulin gtt and IVF  -  Transition once gap closed and bicarb 20 or higher -  Continue q2-4 hour BMP -  Resume diet with insulin coverage -  A1c   Abdominal pain, nausea vomiting diarrhea, possible viral gastroenteritis versus DKA, LFTs, lipase wnl, no further diarrhea and symptoms resolving -  Mild possible colitis on CT abd/pelvis   Substance abuse, UDS positive for cocaine and THC, EtOH neg -  Monitor for withdrawal -  Judicious use of pain medications.  -  As needed Tylenol and tramadol.  -  Counseled about drug abstinence -  Shared a crack pipe and will need hepatitis/HIV/etc testing in about a month -  SW consult  Antiphospholipid antibody syndrome, not on A/C due to noncompliance  Leukocytosis, unclear etiology.  May be reactive from DKA.  Afebrile -  Neg CXR and UA -  Trend WBC  Mild normocytic anemia like hemodilutional -  Trend hgb  No PCP or health insurance -  Forensic scientist -  CM to assist with medication -  Consider setting up for community health and wellness at discharge  Diet:  diabetic Access:  PIV IVF:  yes Proph:  heparin  Code Status: full Family Communication: patient alone Disposition Plan: pending resolution of DKA   Consultants:  none  Procedures:  9/13 CXR/KUB:  Unremarkable   9/14:  "vague prominence of the vasculature around the colon, nonspecific"  Antibiotics:  none   HPI/Subjective:  Continues to have regrets about using drugs.  States he relapsed after about 3 years of abstinence.  Diarrhea slowed and feels hungry - would like to have breakfast.    Objective: Filed Vitals:   05/15/14 0259 05/15/14 0300 05/15/14 0500 05/15/14 0600  BP:  129/50 114/55 136/57  Pulse:  88 88 86  Temp:   98 F (36.7 C)    TempSrc:   Oral   Resp:  Height:  (1.778 m)     Weight: 72.4 kg (159 lb 9.8 oz)     SpO2:  97% 99% 99%    Intake/Output Summary (Last 24 hours) at 05/15/14 0723 Last data filed at 05/15/14 0700  Gross per 24 hour  Intake 2825.4 ml  Output    750 ml  Net 2075.4 ml   Filed Weights   05/15/14 0259  Weight: 72.4 kg (159 lb 9.8 oz)    Exam:   General:  BM, No acute distress  HEENT:  NCAT, MMM  Cardiovascular:  RRR, nl S1, S2 no mrg, 2+ pulses, warm extremities  Respiratory:  CTAB, no increased WOB  Abdomen:   Hyperactive BS, soft, mildly TTP diffusely without rebound or guarding  MSK:   Normal tone and bulk, no LEE  Neuro:  Grossly intact  Data Reviewed: Basic Metabolic Panel:  Recent Labs Lab 05/13/14 1129 05/14/14 2117 05/15/14 0028 05/15/14 0350 05/15/14 0600  NA 138 136* 134* 141 139  K 4.1 5.7* 6.2* 4.6 4.4  CL 98 94* 97 105 105  CO2 25 11* 9* 12* 16*  GLUCOSE 263* 447* 379* 216* 163*  BUN 16 26* 26* 25* 23  CREATININE 1.06 1.45* 1.37* 1.38* 1.42*  CALCIUM 9.1 9.4 8.3* 8.2* 8.1*   Liver Function Tests:  Recent Labs Lab 05/13/14 1129 05/14/14 2117  AST 21 30  ALT 25  34  ALKPHOS 60 81  BILITOT 0.8 0.9  PROT 5.9* 6.6  ALBUMIN 3.3* 3.8    Recent Labs Lab 05/13/14 1132 05/14/14 2117  LIPASE 21 13   No results found for this basename: AMMONIA,  in the last 168 hours CBC:  Recent Labs Lab 05/13/14 1132 05/14/14 2117 05/15/14 0620  WBC 8.7 13.9* 17.9*  NEUTROABS 7.2  --  14.5*  HGB 14.3 15.6 12.8*  HCT 42.8 46.3 38.1*  MCV 85.3 85.1 84.3  PLT 255 300 277   Cardiac Enzymes: No results found for this basename: CKTOTAL, CKMB, CKMBINDEX, TROPONINI,  in the last 168 hours BNP (last 3 results) No results found for this basename: PROBNP,  in the last 8760 hours CBG:  Recent Labs Lab 05/14/14 0811 05/14/14 1240 05/14/14 2119 05/15/14 0010 05/15/14 0207  GLUCAP 315* 120* 391* 316* 262*    Recent Results (from  the past 240 hour(s))  MRSA PCR SCREENING     Status: None   Collection Time    05/15/14  2:42 AM      Result Value Ref Range Status   MRSA by PCR NEGATIVE  NEGATIVE Final   Comment:            The GeneXpert MRSA Assay (FDA     approved for NASAL specimens     only), is one component of a     comprehensive MRSA colonization     surveillance program. It is not     intended to diagnose MRSA     infection nor to guide or     monitor treatment for     MRSA infections.     Studies: Ct Abdomen Pelvis W Contrast  05/15/2014   CLINICAL DATA:  Abdominal pain, nausea, vomiting and diarrhea. Leukocytosis.  EXAM: CT ABDOMEN AND PELVIS WITH CONTRAST  TECHNIQUE: Multidetector CT imaging of the abdomen and pelvis was performed using the standard protocol following bolus administration of intravenous contrast.  CONTRAST:  OMNIPAQUE IOHEXOL 300 MG/ML  SOLN  COMPARISON:  CT of the abdomen and pelvis performed 04/27/2007, and abdominal radiograph performed 05/14/2014  FINDINGS: The visualized lung bases are clear.  The liver and spleen are unremarkable in appearance. The gallbladder is within normal limits. The pancreas and adrenal glands are unremarkable.  The kidneys are unremarkable in appearance. There is no evidence of hydronephrosis. No renal or ureteral stones are seen. No perinephric stranding is appreciated.  No free fluid is identified. The small bowel is unremarkable in appearance. The stomach is within normal limits. No acute vascular abnormalities are seen. Minimal calcification is seen at the distal abdominal aorta and its branches.  The appendix is normal in caliber, without evidence for appendicitis. Trace adjacent stranding is nonspecific.  There is vague prominence of the vasculature about the colon, nonspecific in appearance. There may be minimal associated soft tissue inflammation. No definite colonic wall thickening is seen, but this could reflect a mild infectious or inflammatory  process.  The bladder is mildly distended and grossly unremarkable. The prostate remains normal in size. No inguinal lymphadenopathy is seen.  No acute osseous abnormalities are identified.  IMPRESSION: Vague prominence of the vasculature about the colon, nonspecific in appearance. There may be minimal associated soft tissue inflammation. No definite colonic wall thickening is seen, but this raises concern for a mild infectious or inflammatory process, given the patient's symptoms.   Electronically Signed   By: Roanna Raider M.D.   On: 05/15/2014 02:07   Dg Abd  Acute W/chest  05/14/2014   CLINICAL DATA:  Nausea, vomiting and diarrhea for 2-3 days.  EXAM: ACUTE ABDOMEN SERIES (ABDOMEN 2 VIEW & CHEST 1 VIEW)  COMPARISON:  Chest radiograph from 11/12/2012  FINDINGS: The lungs are well-aerated and clear. There is no evidence of focal opacification, pleural effusion or pneumothorax. The cardiomediastinal silhouette is within normal limits.  The visualized bowel gas pattern is unremarkable. Minimal stool and air are seen within the colon; there is no evidence of small bowel dilatation to suggest obstruction. No free intra-abdominal air is identified on the provided upright view.  No acute osseous abnormalities are seen; the sacroiliac joints are unremarkable in appearance.  IMPRESSION: 1. Unremarkable bowel gas pattern; no free intra-abdominal air seen. 2. No acute cardiopulmonary process identified.   Electronically Signed   By: Roanna Raider M.D.   On: 05/14/2014 23:04    Scheduled Meds: . heparin  5,000 Units Subcutaneous 3 times per day  . sertraline  50 mg Oral Daily   Continuous Infusions: . sodium chloride Stopped (05/15/14 0317)  . dextrose 5 % and 0.45% NaCl 100 mL (05/15/14 0316)  . insulin (NOVOLIN-R) infusion 5.9 Units/hr (05/15/14 0300)    Principal Problem:   DKA (diabetic ketoacidoses) Active Problems:   TOBACCO ABUSE   ABDOMINAL PAIN   ANTICARDIOLIPIN ANTIBODY SYNDROME    Polysubstance abuse   Nausea with vomiting    Time spent: 30 min    Thang Flett, [facility]  [facility] Pager [phone]. If 7PM-7AM, please contact night-coverage at [email or web address], password [facility] 05/15/2014, 7:23 AM  LOS: 1 day

## 2014-05-15 NOTE — Care Management Note (Addendum)
    Page 1 of 1   05/15/2014     4:07:23 PM CARE MANAGEMENT NOTE 05/15/2014  Patient:  John Cox, John Cox   Account Number:  [id number]  Date Initiated:  05/15/2014  Documentation initiated by:  Lanier Clam  Subjective/Objective Assessment:   43 Y/O M ADMITTED W/DKA.     Action/Plan:   FROM HOME.HAS GLUCOMETER.   Anticipated DC Date:  05/17/2014   Anticipated DC Plan:  HOME/SELF CARE  In-house referral  Financial Counselor      DC Planning Services  CM consult  Indigent [facility] Clinic  Medication Assistance      Choice offered to / List presented to:             Status of service:  In process, will continue to follow Medicare Important Message given?   (If response is "NO", the following Medicare IM given date fields will be blank) Date Medicare IM given:   Medicare IM given by:   Date Additional Medicare IM given:   Additional Medicare IM given by:    Discharge Disposition:    Per UR Regulation:  Reviewed for med. necessity/level of care/duration of stay  If discussed at Long Length of Stay Meetings, dates discussed:    Comments:  05/15/14 John Frate RN,BSN NCM [phone] 4P-SPOKE TO PATIENT ABOUT RESOURCES-HEALTH INSURANCE,PCP LISTING-PATIENT AGREE TO [facility]-APPT CAN BE MADE 2 DAYS PRIOR D/C.MAY NEED MATCH PROGRAM FOR MEDS.  WILL PROVIDE HEALTH INSURANCE INFO,RESOURCES,PCP LISTING-WILL ENCOURAGE [facility].$4 [facility] MED LIST.

## 2014-05-15 NOTE — Progress Notes (Signed)
Utilization review completed.  

## 2014-05-16 LAB — GLUCOSE, CAPILLARY
GLUCOSE-CAPILLARY: 109 mg/dL — AB (ref 70–99)
GLUCOSE-CAPILLARY: 136 mg/dL — AB (ref 70–99)
GLUCOSE-CAPILLARY: 117 mg/dL — AB (ref 70–99)
GLUCOSE-CAPILLARY: 194 mg/dL — AB (ref 70–99)
GLUCOSE-CAPILLARY: 176 mg/dL — AB (ref 70–99)
Glucose-Capillary: 105 mg/dL — ABNORMAL HIGH (ref 70–99)
Glucose-Capillary: 117 mg/dL — ABNORMAL HIGH (ref 70–99)
Glucose-Capillary: 123 mg/dL — ABNORMAL HIGH (ref 70–99)
Glucose-Capillary: 133 mg/dL — ABNORMAL HIGH (ref 70–99)
Glucose-Capillary: 214 mg/dL — ABNORMAL HIGH (ref 70–99)
Glucose-Capillary: 107 mg/dL — ABNORMAL HIGH (ref 70–99)
Glucose-Capillary: 175 mg/dL — ABNORMAL HIGH (ref 70–99)

## 2014-05-16 LAB — BASIC METABOLIC PANEL
ANION GAP: 15 (ref 5–15)
ANION GAP: 13 (ref 5–15)
Anion gap: 12 (ref 5–15)
Anion gap: 16 — ABNORMAL HIGH (ref 5–15)
BUN: 16 mg/dL (ref 6–23)
BUN: 13 mg/dL (ref 6–23)
BUN: 12 mg/dL (ref 6–23)
BUN: 11 mg/dL (ref 6–23)
CHLORIDE: 103 meq/L (ref 96–112)
CHLORIDE: 100 meq/L (ref 96–112)
CO2: 22 meq/L (ref 19–32)
CO2: 21 meq/L (ref 19–32)
CO2: 23 meq/L (ref 19–32)
CO2: 24 meq/L (ref 19–32)
CREATININE: 1.24 mg/dL (ref 0.50–1.35)
CREATININE: 1.18 mg/dL (ref 0.50–1.35)
CREATININE: 1.11 mg/dL (ref 0.50–1.35)
Calcium: 8.2 mg/dL — ABNORMAL LOW (ref 8.4–10.5)
Calcium: 8.4 mg/dL (ref 8.4–10.5)
Calcium: 8.4 mg/dL (ref 8.4–10.5)
Calcium: 8.1 mg/dL — ABNORMAL LOW (ref 8.4–10.5)
Chloride: 101 mEq/L (ref 96–112)
Chloride: 101 mEq/L (ref 96–112)
Creatinine, Ser: 1.31 mg/dL (ref 0.50–1.35)
GFR calc Af Amer: 75 mL/min — ABNORMAL LOW (ref >90)
GFR calc Af Amer: 80 mL/min — ABNORMAL LOW (ref >90)
GFR calc Af Amer: 85 mL/min — ABNORMAL LOW (ref >90)
GFR calc non Af Amer: 65 mL/min — ABNORMAL LOW (ref >90)
GFR calc non Af Amer: 69 mL/min — ABNORMAL LOW (ref >90)
GFR calc non Af Amer: 74 mL/min — ABNORMAL LOW (ref >90)
GFR calc non Af Amer: 79 mL/min — ABNORMAL LOW (ref >90)
Glucose, Bld: 115 mg/dL — ABNORMAL HIGH (ref 70–99)
Glucose, Bld: 94 mg/dL (ref 70–99)
Glucose, Bld: 157 mg/dL — ABNORMAL HIGH (ref 70–99)
Glucose, Bld: 183 mg/dL — ABNORMAL HIGH (ref 70–99)
POTASSIUM: 3.9 meq/L (ref 3.7–5.3)
POTASSIUM: 3.9 meq/L (ref 3.7–5.3)
POTASSIUM: 4.3 meq/L (ref 3.7–5.3)
Potassium: 3.3 mEq/L — ABNORMAL LOW (ref 3.7–5.3)
Sodium: 135 mEq/L — ABNORMAL LOW (ref 137–147)
Sodium: 140 mEq/L (ref 137–147)
Sodium: 139 mEq/L (ref 137–147)
Sodium: 137 mEq/L (ref 137–147)

## 2014-05-16 LAB — COMPREHENSIVE METABOLIC PANEL
ALT: 25 U/L (ref 0–53)
ANION GAP: 19 — AB (ref 5–15)
AST: 20 U/L (ref 0–37)
Albumin: 2.8 g/dL — ABNORMAL LOW (ref 3.5–5.2)
Alkaline Phosphatase: 59 U/L (ref 39–117)
BUN: 14 mg/dL (ref 6–23)
CO2: 19 mEq/L (ref 19–32)
Calcium: 8.5 mg/dL (ref 8.4–10.5)
Chloride: 102 mEq/L (ref 96–112)
Creatinine, Ser: 1.27 mg/dL (ref 0.50–1.35)
GFR calc non Af Amer: 67 mL/min — ABNORMAL LOW (ref >90)
GFR, EST AFRICAN AMERICAN: 78 mL/min — AB (ref >90)
GLUCOSE: 113 mg/dL — AB (ref 70–99)
POTASSIUM: 3.9 meq/L (ref 3.7–5.3)
Sodium: 140 mEq/L (ref 137–147)
TOTAL PROTEIN: 4.9 g/dL — AB (ref 6.0–8.3)
Total Bilirubin: 1.4 mg/dL — ABNORMAL HIGH (ref 0.3–1.2)

## 2014-05-16 LAB — CBC
HEMATOCRIT: 37.6 % — AB (ref 39.0–52.0)
HEMOGLOBIN: 12.7 g/dL — AB (ref 13.0–17.0)
MCH: 28.5 pg (ref 26.0–34.0)
MCHC: 33.8 g/dL (ref 30.0–36.0)
MCV: 84.3 fL (ref 78.0–100.0)
Platelets: 240 10*3/uL (ref 150–400)
RBC: 4.46 MIL/uL (ref 4.22–5.81)
RDW: 14.1 % (ref 11.5–15.5)
WBC: 11.9 10*3/uL — AB (ref 4.0–10.5)

## 2014-05-16 LAB — HEMOGLOBIN A1C
Hgb A1c MFr Bld: 10.3 % — ABNORMAL HIGH (ref <5.7)
MEAN PLASMA GLUCOSE: 249 mg/dL — AB (ref <117)

## 2014-05-16 LAB — LIPASE, BLOOD: Lipase: 16 U/L (ref 11–59)

## 2014-05-16 MED ORDER — GLUCOSE 40 % PO GEL: 1.0000 | ORAL | Status: DC | PRN

## 2014-05-16 MED ORDER — INSULIN GLARGINE 100 UNIT/ML ~~LOC~~ SOLN
10.0000 [IU] | Freq: Every day | SUBCUTANEOUS | Status: DC
  Administered 2014-05-16: 10 [IU] via SUBCUTANEOUS
  Filled 2014-05-16: qty 0.1

## 2014-05-16 MED ORDER — DEXTROSE-NACL 5-0.45 % IV SOLN: INTRAVENOUS | Status: DC

## 2014-05-16 MED ORDER — INSULIN ASPART 100 UNIT/ML ~~LOC~~ SOLN: 3.0000 [IU] | Freq: Three times a day (TID) | SUBCUTANEOUS | Status: DC

## 2014-05-16 MED ORDER — DEXTROSE 50 % IV SOLN: 50.0000 mL | Freq: Once | INTRAVENOUS | Status: AC | PRN

## 2014-05-16 MED ORDER — DEXTROSE-NACL 5-0.45 % IV SOLN
INTRAVENOUS | Status: DC
  Administered 2014-05-16: 15:00:00 via INTRAVENOUS

## 2014-05-16 MED ORDER — INSULIN ASPART 100 UNIT/ML ~~LOC~~ SOLN: 0.0000 [IU] | Freq: Every day | SUBCUTANEOUS | Status: DC

## 2014-05-16 MED ORDER — SODIUM CHLORIDE 0.9 % IV SOLN
INTRAVENOUS | Status: DC
  Administered 2014-05-16: via INTRAVENOUS

## 2014-05-16 MED ORDER — SODIUM CHLORIDE 0.9 % IV SOLN
INTRAVENOUS | Status: DC
  Administered 2014-05-16: 0.4 [IU]/h via INTRAVENOUS
  Filled 2014-05-16: qty 2.5

## 2014-05-16 MED ORDER — DEXTROSE 50 % IV SOLN: 25.0000 mL | Freq: Once | INTRAVENOUS | Status: AC | PRN

## 2014-05-16 MED ORDER — PROMETHAZINE HCL 25 MG/ML IJ SOLN
12.5000 mg | Freq: Four times a day (QID) | INTRAMUSCULAR | Status: DC | PRN
  Administered 2014-05-18: 25 mg via INTRAVENOUS
  Filled 2014-05-16: qty 1

## 2014-05-16 MED ORDER — SODIUM CHLORIDE 0.9 % IV SOLN
INTRAVENOUS | Status: DC
  Administered 2014-05-16: 09:00:00 via INTRAVENOUS

## 2014-05-16 MED ORDER — INSULIN REGULAR BOLUS VIA INFUSION
0.0000 [IU] | Freq: Three times a day (TID) | INTRAVENOUS | Status: DC
Start: 2014-05-16 — End: 2014-05-16
  Filled 2014-05-16: qty 10

## 2014-05-16 MED ORDER — INSULIN ASPART 100 UNIT/ML ~~LOC~~ SOLN
0.0000 [IU] | Freq: Three times a day (TID) | SUBCUTANEOUS | Status: DC
  Administered 2014-05-17: 3 [IU] via SUBCUTANEOUS
  Administered 2014-05-17: 5 [IU] via SUBCUTANEOUS

## 2014-05-16 MED ORDER — ACETAMINOPHEN 325 MG PO TABS
650.0000 mg | ORAL_TABLET | Freq: Four times a day (QID) | ORAL | Status: DC | PRN
  Administered 2014-05-16: 650 mg via ORAL
  Filled 2014-05-16: qty 2

## 2014-05-16 MED ORDER — INSULIN ASPART 100 UNIT/ML ~~LOC~~ SOLN
0.0000 [IU] | Freq: Three times a day (TID) | SUBCUTANEOUS | Status: DC
  Administered 2014-05-16: 1 [IU] via SUBCUTANEOUS

## 2014-05-16 MED ORDER — ONDANSETRON HCL 4 MG/2ML IJ SOLN
4.0000 mg | Freq: Four times a day (QID) | INTRAMUSCULAR | Status: DC | PRN
Start: 2014-05-16 — End: 2014-05-18
  Administered 2014-05-16 – 2014-05-18 (×4): 4 mg via INTRAVENOUS
  Filled 2014-05-16 (×4): qty 2

## 2014-05-16 NOTE — Progress Notes (Signed)
Inpatient Diabetes Program Recommendations  AACE/ADA: New Consensus Statement on Inpatient Glycemic Control (2013)  Target Ranges:  Prepandial:   less than 140 mg/dL      Peak postprandial:   less than 180 mg/dL (1-2 hours)      Critically ill patients:  140 - 180 mg/dL   Reason for Visit: DKA  Diabetes history: DM1 Outpatient Diabetes medications: Novolog Current orders for Inpatient glycemic control: GlucoStabilizer for DKA - Transitioning to Lantus and Novolog  Patient presenting to the ED for suicidal ideation without plan with increased depression and relapse with cocaine abuse. No HI, hallucinations, self injury, or chronic ETOH abuse. Transitioning from IV insulin to SQ after CO2<20 and blood sugars <180 mg/dL. AG - 16. Lantus 10 units given prior to discontinuing insulin drip.  Recommendations: Continue with GlucoStabilizer until AG is closed. Lantus 10 units bid Novolog sensitive tidwc and hs + 3 units tidwc for meal coverage insulin. Will need [facility] appt for MD to manage diabetes after discharge.  Will discuss HgbA1C of 10.3%. Thank you. Ailene Ards, RD, LDN, CDE Inpatient Diabetes Coordinator [phone]

## 2014-05-16 NOTE — Progress Notes (Addendum)
[facility] PROGRESS NOTE  PARMVIR BOOMER QVZ:[id number] DOB: 05-Jun-1971 DOA: 05/14/2014 PCP: Default, Provider, MD  Brief Summary  43 yo M with hx of DM and polysubstance abuse p/w N/V/D and DKA.  Vomiting and diarrhea have been prominent and not improved with initial resolution of DKA.  Gap reopened today after starting on subcut insulin so back on insulin gtt overnight.    Assessment/Plan  DKA (diabetic ketoacidoses), improving but not resolved -  lantus 10 units + low dose SSI today -  BMP at 10AM to ensure gap still closed -  If gap still closed, then ok to transfer to med-surg -  A1c pending  Abdominal pain, nausea vomiting diarrhea, persistent -  Change phenergan to first line antiemetic -  F/u C. Diff, GI pathogen panel -  Mild possible colitis on CT abd/pelvis  Substance abuse, UDS positive for cocaine and THC, EtOH neg -  Monitor for withdrawal -  Judicious use of pain medications.  -  As needed Tylenol and tramadol.  -  Counseled about drug abstinence -  Shared a crack pipe and will need hepatitis/HIV/etc testing in about a month -  SW consult  Antiphospholipid antibody syndrome, not on A/C due to noncompliance  Leukocytosis, unclear etiology.  May be reactive from DKA or from colitis.  Afebrile -  Neg CXR and UA -  Trended down  Mild normocytic anemia like hemodilutional and stable around 12.7mg /dl  No PCP or health insurance -  Financial planner -  CM to assist with medication -  Consider setting up for community health and wellness at discharge  Diet:  diabetic Access:  PIV IVF:  yes Proph:  heparin  Code Status: full Family Communication: patient alone Disposition Plan:  Home when CBGs stable and N/V/D improved.  Plan on 70/30 insulin   Consultants:  none  Procedures:  9/13 CXR/KUB:  Unremarkable   9/14:  "vague prominence of the vasculature around the colon, nonspecific"  Antibiotics:  none   HPI/Subjective:  Still having  nausea and vomiting (NBNB) and watery diarrhea (twice overnight).  No stool sample sent bc mixed with stool.  Tolerating a few bites of cracker and sips of ginger ale  Objective: Filed Vitals:   05/16/14 0037 05/16/14 0200 05/16/14 0400 05/16/14 0600  BP:  120/50 143/67 123/62  Pulse:  78 87 85  Temp: 99.2 F (37.3 C)  99 F (37.2 C)   TempSrc: Oral     Resp:  Height:      Weight:      SpO2:  98% 98% 98%    Intake/Output Summary (Last 24 hours) at 05/16/14 0728 Last data filed at 05/16/14 0600  Gross per 24 hour  Intake 1897.5 ml  Output   1501 ml  Net  396.5 ml   Filed Weights   05/15/14 0259  Weight: 72.4 kg (159 lb 9.8 oz)    Exam:   General:  BM, No acute distress  HEENT:  NCAT, MMM  Cardiovascular:  RRR, nl S1, S2 no mrg, 2+ pulses, warm extremities  Respiratory:  CTAB, no increased WOB Abdomen:   Hyperactive BS, soft, mildly TTP diffusely but most at LUQ without rebound or guarding  MSK:   Normal tone and bulk, no LEE  Neuro:  Grossly intact  Data Reviewed: Basic Metabolic Panel:  Recent Labs Lab 05/15/14 0933 05/15/14 1353 05/15/14 1820 05/15/14 2145 05/16/14 0211  NA 139 135* 136* 135* 135*  K 4.3 5.0 4.3 3.9  3.9  CL 105 98 99 100 101  CO2 19 18* 16* 18* 22  GLUCOSE 88 206* 145* 184* 115*  BUN CREATININE 1.39* 1.38* 1.27 1.28 1.31  CALCIUM 8.1* 8.7 8.5 8.3* 8.2*   Liver Function Tests:  Recent Labs Lab 05/13/14 1129 05/14/14 2117  AST 21 30  ALT 25 34  ALKPHOS 60 81  BILITOT 0.8 0.9  PROT 5.9* 6.6  ALBUMIN 3.3* 3.8    Recent Labs Lab 05/13/14 1132 05/14/14 2117  LIPASE 21 13   No results found for this basename: AMMONIA,  in the last 168 hours CBC:  Recent Labs Lab 05/13/14 1132 05/14/14 2117 05/15/14 0620 05/16/14 0211  WBC 8.7 13.9* 17.9* 11.9*  NEUTROABS 7.2  --  14.5*  --   HGB 14.3 15.6 12.8* 12.7*  HCT 42.8 46.3 38.1* 37.6*  MCV 85.3 85.1 84.3 84.3  PLT 255 300 277 240   Cardiac  Enzymes: No results found for this basename: CKTOTAL, CKMB, CKMBINDEX, TROPONINI,  in the last 168 hours BNP (last 3 results) No results found for this basename: PROBNP,  in the last 8760 hours CBG:  Recent Labs Lab 05/15/14 1617 05/15/14 1909 05/15/14 2005 05/15/14 2110 05/15/14 2213  GLUCAP 121* 185* 193* 213* 184*    Recent Results (from the past 240 hour(s))  MRSA PCR SCREENING     Status: None   Collection Time    05/15/14  2:42 AM      Result Value Ref Range Status   MRSA by PCR NEGATIVE  NEGATIVE Final   Comment:            The GeneXpert MRSA Assay (FDA     approved for NASAL specimens     only), is one component of a     comprehensive MRSA colonization     surveillance program. It is not     intended to diagnose MRSA     infection nor to guide or     monitor treatment for     MRSA infections.     Studies: Ct Abdomen Pelvis W Contrast  05/15/2014   CLINICAL DATA:  Abdominal pain, nausea, vomiting and diarrhea. Leukocytosis.  EXAM: CT ABDOMEN AND PELVIS WITH CONTRAST  TECHNIQUE: Multidetector CT imaging of the abdomen and pelvis was performed using the standard protocol following bolus administration of intravenous contrast.  CONTRAST:  OMNIPAQUE IOHEXOL 300 MG/ML  SOLN  COMPARISON:  CT of the abdomen and pelvis performed 04/27/2007, and abdominal radiograph performed 05/14/2014  FINDINGS: The visualized lung bases are clear.  The liver and spleen are unremarkable in appearance. The gallbladder is within normal limits. The pancreas and adrenal glands are unremarkable.  The kidneys are unremarkable in appearance. There is no evidence of hydronephrosis. No renal or ureteral stones are seen. No perinephric stranding is appreciated.  No free fluid is identified. The small bowel is unremarkable in appearance. The stomach is within normal limits. No acute vascular abnormalities are seen. Minimal calcification is seen at the distal abdominal aorta and its branches.  The  appendix is normal in caliber, without evidence for appendicitis. Trace adjacent stranding is nonspecific.  There is vague prominence of the vasculature about the colon, nonspecific in appearance. There may be minimal associated soft tissue inflammation. No definite colonic wall thickening is seen, but this could reflect a mild infectious or inflammatory process.  The bladder is mildly distended and grossly unremarkable. The prostate remains normal in size. No inguinal  lymphadenopathy is seen.  No acute osseous abnormalities are identified.  IMPRESSION: Vague prominence of the vasculature about the colon, nonspecific in appearance. There may be minimal associated soft tissue inflammation. No definite colonic wall thickening is seen, but this raises concern for a mild infectious or inflammatory process, given the patient's symptoms.   Electronically Signed   By: Roanna Raider M.D.   On: 05/15/2014 02:07   Dg Abd Acute W/chest  05/14/2014   CLINICAL DATA:  Nausea, vomiting and diarrhea for 2-3 days.  EXAM: ACUTE ABDOMEN SERIES (ABDOMEN 2 VIEW & CHEST 1 VIEW)  COMPARISON:  Chest radiograph from 11/12/2012  FINDINGS: The lungs are well-aerated and clear. There is no evidence of focal opacification, pleural effusion or pneumothorax. The cardiomediastinal silhouette is within normal limits.  The visualized bowel gas pattern is unremarkable. Minimal stool and air are seen within the colon; there is no evidence of small bowel dilatation to suggest obstruction. No free intra-abdominal air is identified on the provided upright view.  No acute osseous abnormalities are seen; the sacroiliac joints are unremarkable in appearance.  IMPRESSION: 1. Unremarkable bowel gas pattern; no free intra-abdominal air seen. 2. No acute cardiopulmonary process identified.   Electronically Signed   By: Roanna Raider M.D.   On: 05/14/2014 23:04    Scheduled Meds: . heparin  5,000 Units Subcutaneous 3 times per day  . Influenza vac  split quadrivalent PF  0.5 mL Intramuscular Tomorrow-1000  . insulin aspart  0-5 Units Subcutaneous QHS  . insulin aspart  0-9 Units Subcutaneous TID WC  . insulin glargine  10 Units Subcutaneous QHS  . pantoprazole (PROTONIX) IV  40 mg Intravenous BID  . pneumococcal 23 valent vaccine  0.5 mL Intramuscular Tomorrow-1000  . sertraline  50 mg Oral Daily   Continuous Infusions: . insulin (NOVOLIN-R) infusion Stopped (05/16/14 0230)    Principal Problem:   DKA (diabetic ketoacidoses) Active Problems:   TOBACCO ABUSE   ABDOMINAL PAIN   ANTICARDIOLIPIN ANTIBODY SYNDROME   Polysubstance abuse   Nausea with vomiting    Time spent: 30 min    Rafan Sanders  [facility] Pager [phone]. If 7PM-7AM, please contact night-coverage at [email or web address], password [facility] 05/16/2014, 7:28 AM  LOS: 2 days

## 2014-05-17 DIAGNOSIS — E1310 Other specified diabetes mellitus with ketoacidosis without coma: Secondary | ICD-10-CM

## 2014-05-17 LAB — GLUCOSE, CAPILLARY
GLUCOSE-CAPILLARY: 153 mg/dL — AB (ref 70–99)
GLUCOSE-CAPILLARY: 183 mg/dL — AB (ref 70–99)
Glucose-Capillary: 91 mg/dL (ref 70–99)
Glucose-Capillary: 102 mg/dL — ABNORMAL HIGH (ref 70–99)
Glucose-Capillary: 132 mg/dL — ABNORMAL HIGH (ref 70–99)
Glucose-Capillary: 155 mg/dL — ABNORMAL HIGH (ref 70–99)
Glucose-Capillary: 168 mg/dL — ABNORMAL HIGH (ref 70–99)
Glucose-Capillary: 180 mg/dL — ABNORMAL HIGH (ref 70–99)
Glucose-Capillary: 221 mg/dL — ABNORMAL HIGH (ref 70–99)
Glucose-Capillary: 109 mg/dL — ABNORMAL HIGH (ref 70–99)

## 2014-05-17 LAB — BASIC METABOLIC PANEL
ANION GAP: 13 (ref 5–15)
BUN: 8 mg/dL (ref 6–23)
CALCIUM: 8.1 mg/dL — AB (ref 8.4–10.5)
CHLORIDE: 101 meq/L (ref 96–112)
CO2: 22 mEq/L (ref 19–32)
Creatinine, Ser: 1.06 mg/dL (ref 0.50–1.35)
GFR calc Af Amer: >90 mL/min (ref >90)
GFR calc non Af Amer: 84 mL/min — ABNORMAL LOW (ref >90)
Glucose, Bld: 175 mg/dL — ABNORMAL HIGH (ref 70–99)
Potassium: 3.9 mEq/L (ref 3.7–5.3)
SODIUM: 136 meq/L — AB (ref 137–147)

## 2014-05-17 LAB — CBC
HCT: 38.8 % — ABNORMAL LOW (ref 39.0–52.0)
Hemoglobin: 13.2 g/dL (ref 13.0–17.0)
MCH: 28.5 pg (ref 26.0–34.0)
MCHC: 34 g/dL (ref 30.0–36.0)
MCV: 83.8 fL (ref 78.0–100.0)
PLATELETS: UNDETERMINED 10*3/uL (ref 150–400)
RBC: 4.63 MIL/uL (ref 4.22–5.81)
RDW: 13.9 % (ref 11.5–15.5)
WBC: 6.5 10*3/uL (ref 4.0–10.5)

## 2014-05-17 MED ORDER — INSULIN ASPART 100 UNIT/ML ~~LOC~~ SOLN
0.0000 [IU] | Freq: Three times a day (TID) | SUBCUTANEOUS | Status: DC
  Administered 2014-05-18: 2 [IU] via SUBCUTANEOUS
  Administered 2014-05-18: 3 [IU] via SUBCUTANEOUS

## 2014-05-17 MED ORDER — INSULIN ASPART PROT & ASPART (70-30 MIX) 100 UNIT/ML ~~LOC~~ SUSP
15.0000 [IU] | Freq: Two times a day (BID) | SUBCUTANEOUS | Status: DC
  Administered 2014-05-18: 15 [IU] via SUBCUTANEOUS
  Filled 2014-05-17 (×2): qty 10

## 2014-05-17 NOTE — Progress Notes (Signed)
Patient arrived to unit at about 1630. Transferred to bed and resting quietly at this time.

## 2014-05-17 NOTE — Progress Notes (Signed)
Inpatient Diabetes Program Recommendations  AACE/ADA: New Consensus Statement on Inpatient Glycemic Control (2013)  Target Ranges:  Prepandial:   less than 140 mg/dL      Peak postprandial:   less than 180 mg/dL (1-2 hours)      Critically ill patients:  140 - 180 mg/dL   Reason for Visit: GlucoStabilizer discontinued  Pt states he ate only a few bites at dinner last night and has not eaten breakfast yet. Encouraged pt to increase po intake and order from menu. Denies nausea. "Just haven't felt like eating."  GlucoStabilizer discontinued last night and Lantus 10 units was given. Pt states he prefers 70/30 insulin (2 shots) instead of Lantus/Novolog (4 shots). Discussed need for correction insulin for high blood sugars. Pt requests care manager visit to discuss discharge needs. Has glucose meter at home to check blood sugars.  Results for ROLLAND, STEINERT (MRN [id number]) as of 05/17/2014 09:53  Ref. Range 05/16/2014 07:55 05/16/2014 15:58 05/16/2014 19:57 05/16/2014 21:01 05/16/2014 22:59  Glucose-Capillary Latest Range: 70-99 mg/dL 132 (H) 440 (H) 102 (H) 176 (H) 175 (H)  Results for WOODARD, PERRELL (MRN [id number]) as of 05/17/2014 09:53  Ref. Range 05/17/2014 05:59  Sodium Latest Range: 137-147 mEq/L 136 (L)  Potassium Latest Range: 3.7-5.3 mEq/L 3.9  Chloride Latest Range: 96-112 mEq/L 101  CO2 Latest Range: 19-32 mEq/L 22  BUN Latest Range: 6-23 mg/dL 8  Creatinine Latest Range: 0.50-1.35 mg/dL 3.47  Calcium Latest Range: 8.4-10.5 mg/dL 8.1 (L)  GFR calc non Af Amer Latest Range: >90 mL/min 84 (L)  GFR calc Af Amer Latest Range: >90 mL/min >90  Glucose Latest Range: 70-99 mg/dL 425 (H)  Anion gap Latest Range: 5-15  13   Results for KROSS, SWALLOWS (MRN [id number]) as of 05/17/2014 09:53  Ref. Range 05/16/2014 02:11  Hemoglobin A1C Latest Range: <5.7 % 10.3 (H)    When po intake increases, recommend changing to 70/30 insulin. Consider 70/30 20 units bid. Will need Regular insulin  prescription for sliding scale at home. F/U with PCP at [facility] May need prescription for strips and lancets at discharge.  Will continue to follow. Thank you. Ailene Ards, RD, LDN, CDE Inpatient Diabetes Coordinator [phone]

## 2014-05-17 NOTE — Progress Notes (Addendum)
[facility] PROGRESS NOTE  John Cox ZOX:[id number] DOB: 03/27/1971 DOA: 05/14/2014 PCP: Default, Provider, MD  Brief Summary  43 yo M with hx of polysubstance abuse/recent cocaine use, DM, portal vein thrombosis, antiphospholipid antibodies-not on anticoagulation due to noncompliance, presented with nausea, vomiting, diarrhea and abdominal pain. Admitted to step down unit and treated for DKA with hydration and insulin drip-DKA resolved.   Assessment/Plan  DKA (diabetic ketoacidoses) -Patient was initially treated with aggressive IV fluid hydration and insulin drip. DKA had improved and then worsened again. AG closed. Is on NovoLog SSI and mealtime coverage. - Will switch to 70/30 insulin in preparation for DC in AM. - A1C: 10.3  Abdominal pain, nausea vomiting diarrhea - Unclear etiology:? Secondary to DKA/acute GE/gastritis/other etiology. Resolved with supportive therapy. -  GI pathogen panel PCR: Negative. Unable to send C. difficile PCR secondary to no BM. -  Mild possible colitis on CT abd/pelvis  Substance abuse, UDS positive for cocaine and THC, EtOH neg -  Monitor for withdrawal -  Judicious use of pain medications.  -  As needed Tylenol and tramadol.  -  Counseled about drug abstinence -  Shared a crack pipe and will need hepatitis/HIV/etc testing in about a month -  SW consult  Antiphospholipid antibody syndrome, not on A/C due to noncompliance  Leukocytosis, unclear etiology.  May be reactive from DKA or from colitis.  Afebrile -  Neg CXR and UA -  Resolved  Mild normocytic anemia like hemodilutional. Hemoglobin normal.  No PCP or health insurance -  Financial planner -  CM to assist with medication -  Consider setting up for community health and wellness at discharge   Code Status: Full Family Communication: patient alone Disposition Plan:  Transfer to medical bed.   Consultants:  None  Procedures:  9/13 CXR/KUB:  Unremarkable   9/14:   "vague prominence of the vasculature around the colon, nonspecific"  Antibiotics:  none   HPI/Subjective: Denies nausea, vomiting, abdominal pain or diarrhea over last 24 hours. Tolerating diet.  Objective: Filed Vitals:   05/17/14 0500 05/17/14 0608 05/17/14 0800 05/17/14 1200  BP:  139/79 129/74 144/71  Pulse: 70 73 73 70  Temp:  98.3 F (36.8 C) 98.2 F (36.8 C) 98.5 F (36.9 C)  TempSrc:  Oral Oral Oral  Resp:  Height:      Weight:      SpO2: 99% 99% 97% 99%    Intake/Output Summary (Last 24 hours) at 05/17/14 1346 Last data filed at 05/17/14 1200  Gross per 24 hour  Intake 2461.66 ml  Output   1375 ml  Net 1086.66 ml   Filed Weights   05/15/14 0259  Weight: 72.4 kg (159 lb 9.8 oz)    Exam:   General:  Pleasant young male lying comfortably in bed.  Cardiovascular:  RRR, nl S1, S2 no mrg, 2+ pulses, warm extremities. Telemetry: Sinus rhythm.  Respiratory:  CTAB, no increased WOB  Abdomen:   Nondistended, soft and nontender. Normal bowel sounds heard.  Neuro:  Alert and oriented. No focal deficits.  Data Reviewed: Basic Metabolic Panel:  Recent Labs Lab 05/16/14 0946 05/16/14 1441 05/16/14 1820 05/16/14 2205 05/17/14 0559  NA 140 140 139 137 136*  K 3.9 3.9 4.3 3.3* 3.9  CL 102 103 101 100 101  CO2 GLUCOSE 113* 94 157* 183* 175*  BUN CREATININE 1.27 1.24 1.18 1.11 1.06  CALCIUM 8.5 8.4 8.4 8.1* 8.1*   Liver Function Tests:  Recent Labs Lab 05/13/14 1129 05/14/14 2117 05/16/14 0946  AST ALT 25 34 25  ALKPHOS 60 81 59  BILITOT 0.8 0.9 1.4*  PROT 5.9* 6.6 4.9*  ALBUMIN 3.3* 3.8 2.8*    Recent Labs Lab 05/13/14 1132 05/14/14 2117 05/16/14 0946  LIPASE No results found for this basename: AMMONIA,  in the last 168 hours CBC:  Recent Labs Lab 05/13/14 1132 05/14/14 2117 05/15/14 0620 05/16/14 0211 05/17/14 0559  WBC 8.7 13.9* 17.9* 11.9* 6.5  NEUTROABS 7.2  --   14.5*  --   --   HGB 14.3 15.6 12.8* 12.7* 13.2  HCT 42.8 46.3 38.1* 37.6* 38.8*  MCV 85.3 85.1 84.3 84.3 83.8  PLT 255 300 277 240 PLATELET CLUMPS NOTED ON SMEAR, UNABLE TO ESTIMATE   Cardiac Enzymes: No results found for this basename: CKTOTAL, CKMB, CKMBINDEX, TROPONINI,  in the last 168 hours BNP (last 3 results) No results found for this basename: PROBNP,  in the last 8760 hours CBG:  Recent Labs Lab 05/16/14 0755 05/16/14 1558 05/16/14 1957 05/16/14 2101 05/16/14 2259  GLUCAP 123* 107* 194* 176* 175*    Recent Results (from the past 240 hour(s))  MRSA PCR SCREENING     Status: None   Collection Time    05/15/14  2:42 AM      Result Value Ref Range Status   MRSA by PCR NEGATIVE  NEGATIVE Final   Comment:            The GeneXpert MRSA Assay (FDA     approved for NASAL specimens     only), is one component of a     comprehensive MRSA colonization     surveillance program. It is not     intended to diagnose MRSA     infection nor to guide or     monitor treatment for     MRSA infections.     Studies: No results found.  Scheduled Meds: . heparin  5,000 Units Subcutaneous 3 times per day  . insulin aspart  0-15 Units Subcutaneous TID WC  . insulin aspart  0-5 Units Subcutaneous QHS  . insulin aspart  3 Units Subcutaneous TID WC  . pantoprazole (PROTONIX) IV  40 mg Intravenous BID  . sertraline  50 mg Oral Daily   Continuous Infusions: . sodium chloride 100 mL/hr at 05/16/14 2347    Principal Problem:   DKA (diabetic ketoacidoses) Active Problems:   TOBACCO ABUSE   ABDOMINAL PAIN   ANTICARDIOLIPIN ANTIBODY SYNDROME   Polysubstance abuse   Nausea with vomiting    Time spent: 30 min    HONGALGI,ANAND, MD, FACP, FHM. [facility] Pager [phone]  If 7PM-7AM, please contact night-coverage [email or web address] Password [email or web address] 05/17/2014, 1:51 PM    LOS: 3 days

## 2014-05-17 NOTE — Progress Notes (Signed)
Clinical Social Work Department BRIEF PSYCHOSOCIAL ASSESSMENT 05/17/2014  Patient:  John Cox, John Cox     Account Number:  [id number]     Admit date:  05/14/2014  Clinical Social Worker:  Ulyess Blossom  Date/Time:  05/17/2014 01:30 PM  Referred by:  Physician  Date Referred:  05/17/2014 Referred for  Substance Abuse   Other Referral:   Interview type:  Patient Other interview type:    PSYCHOSOCIAL DATA Living Status:  FAMILY Admitted from facility:   Level of care:   Primary support name:  Vaughan Basta Williams/[phone] Primary support relationship to patient:  FAMILY Degree of support available:   adequate    CURRENT CONCERNS Current Concerns  Post-Acute Placement   Other Concerns:    SOCIAL WORK ASSESSMENT / PLAN CSW received referral that pt interested in receiving resources for substance abuse.    Per chart, pt positive cocaine and THC upon admission.    CSW met with pt at bedside. CSW introduced self and explained role. CSW discussed with pt concerns surrounding his current drug use. Pt openly discussed that he used cocaine the day of admission, but reports that this was the first time in three years that he used cocaine. Pt shared that he has been sober for three years, but was "hanging around the wrong people" and used cocaine. Pt does not elaborate on any specific stressor that may have led to cocaine use. CSW provided support as pt shared that he went to NA meetings 3 years ago when he became sober and found these meetings very helpful. Pt shared that he is aware of the resources that he needs to seek following hospitalization given that he relapsed. Pt states that he plans to return to NA meetings and shared that this hospital admission has made him realize even more that he needs to remain sober. Pt agreeable to substance abuse resources and list of NA meetings.    No futher social work needs identified at this time.    CSW signing off.   Assessment/plan  status:  No Further Intervention Required Other assessment/ plan:   Information/referral to community resources:   Outpatient and Inpatient Substance Abuse Resources; list of NA meetings in the area    PATIENT'S/FAMILY'S RESPONSE TO PLAN OF CARE: Pt alert and oriented x 4. Pt openly discussed drug use and expressed regret that he used after being sober for 3 years. Psychoeducation provided regarding negative impact of drug use on pt health and pt responded well to recommendation to seek assistance following hospitalization and reports that he plans to begin going to NA meetings again.    CSW signing off. Please re-consult if further social work needs arise.    Alison Murray, MSW, [place] [facility] [phone]

## 2014-05-18 LAB — GLUCOSE, CAPILLARY
GLUCOSE-CAPILLARY: 245 mg/dL — AB (ref 70–99)
Glucose-Capillary: 231 mg/dL — ABNORMAL HIGH (ref 70–99)
Glucose-Capillary: 415 mg/dL — ABNORMAL HIGH (ref 70–99)
Glucose-Capillary: 198 mg/dL — ABNORMAL HIGH (ref 70–99)

## 2014-05-18 MED ORDER — INSULIN ASPART PROT & ASPART (70-30 MIX) 100 UNIT/ML ~~LOC~~ SUSP: 20.0000 [IU] | Freq: Two times a day (BID) | SUBCUTANEOUS | Status: DC

## 2014-05-18 MED ORDER — OMEPRAZOLE MAGNESIUM 20 MG PO TBEC: 20.0000 mg | DELAYED_RELEASE_TABLET | Freq: Every day | ORAL | Status: DC

## 2014-05-18 MED ORDER — "INSULIN SYRINGE-NEEDLE U-100 29G X 1/2"" 0.3 ML MISC": Status: DC

## 2014-05-18 MED ORDER — INSULIN ASPART 100 UNIT/ML ~~LOC~~ SOLN
10.0000 [IU] | Freq: Once | SUBCUTANEOUS | Status: AC
  Administered 2014-05-18: 10 [IU] via SUBCUTANEOUS

## 2014-05-18 MED ORDER — ACETAMINOPHEN 325 MG PO TABS: 650.0000 mg | ORAL_TABLET | Freq: Four times a day (QID) | ORAL | Status: DC | PRN

## 2014-05-18 MED ORDER — PROMETHAZINE HCL 12.5 MG PO TABS: 12.5000 mg | ORAL_TABLET | Freq: Three times a day (TID) | ORAL | Status: DC | PRN

## 2014-05-18 NOTE — Discharge Summary (Signed)
Physician Discharge Summary  John Cox ZOX:[id number] DOB: 1971-05-29 DOA: 05/14/2014  PCP: Default, Provider, MD  Admit date: 05/14/2014 Discharge date: 05/18/2014  Time spent: Greater than 30 minutes  Recommendations for Outpatient Follow-up:  1. PCP/[facility] on 05/23/14 at 9:30 AM. Shared a crack pipe and will need hepatitis/HIV/etc testing in about a month    Discharge Diagnoses:  Principal Problem:   DKA (diabetic ketoacidoses) Active Problems:   TOBACCO ABUSE   ABDOMINAL PAIN   ANTICARDIOLIPIN ANTIBODY SYNDROME   Polysubstance abuse   Nausea with vomiting   Discharge Condition: Improved & Stable  Diet recommendation: Heart Healthy & Diabetic diet.  Filed Weights   05/15/14 0259 05/17/14 1632  Weight: 72.4 kg (159 lb 9.8 oz) 71.6 kg (157 lb 13.6 oz)    History of present illness:  43 yo M with hx of polysubstance abuse/recent cocaine use, DM, portal vein thrombosis, antiphospholipid antibodies-not on anticoagulation due to noncompliance, presented with nausea, vomiting, diarrhea and abdominal pain. Admitted to step down unit and treated for DKA with hydration and insulin drip-DKA resolved.   Hospital Course:   DKA (diabetic ketoacidoses)  -Patient was initially treated with aggressive IV fluid hydration and insulin drip. DKA had improved and then worsened again. AG finally closed.  - switched to 70/30 insulin on 9/16 p.m. CBG is fluctuating but better. We'll discharge home on 20 units twice a day. Patient counseled extensively regarding compliance with medications, diet and M.D. followups. These medications will need further titration as outpatient. - A1C: 10.3  - he states that he was on 70/30 insulin in the past but stopped taking it due to financial difficulties. Over the last 6 months, patient has been using his mother's Lantus. He was advised not to use others medications. He states that he will be able to procure 70/30 insulin at  this time -advised to go to [facility]. Case management advised that patient may be able to get assistance with the insulins at the [facility] health clinic.  Abdominal pain, nausea vomiting diarrhea  - Unclear etiology:? Secondary to DKA/acute GE/gastritis/other etiology. Resolved with supportive therapy.  - GI pathogen panel PCR: Negative. Unable to send C. difficile PCR secondary to no BM.  - ? Mild possible colitis on CT abd/pelvis. May consider further workup if recurrence of symptoms.  Substance abuse, UDS positive for cocaine and THC, EtOH neg  - Counseled about drug abstinence  - Shared a crack pipe and will need hepatitis/HIV/etc testing in about a month  - No overt withdrawal features.  Antiphospholipid antibody syndrome, not on A/C due to noncompliance   Leukocytosis, unclear etiology. May be reactive from DKA or from colitis. Afebrile  - Neg CXR and UA  - Resolved   Mild normocytic anemia like hemodilutional. Hemoglobin normal.   No PCP or health insurance  - [facility] be able to get assistance with medications.   Consultations:  None   Procedures:  None    Discharge Exam:  Complaints:  States that he was vomiting a little bit overnight. However nursing reported that there was no emesis-just spitting. Mild intermittent nausea reported by patient this morning but no vomiting. Has tolerated diet. Denies abdominal pain. No BM   Filed Vitals:   05/18/14 0520 05/18/14 1100 05/18/14 1106 05/18/14 1410  BP: 129/75 115/61 115/61 116/67  Pulse: 79 75 75 76  Temp: 98.5 F (36.9 C) 98.8 F (37.1 C) 98.8 F (37.1 C) 98.7  F (37.1 C)  TempSrc: Oral Oral Oral Oral  Resp: Height:      Weight:      SpO2: 98% 99% 99% 99%   General: Pleasant young male lying comfortably in bed.  Cardiovascular: RRR, nl S1, S2 no mrg, 2+ pulses, warm extremities.  Respiratory: CTAB, no increased WOB Abdomen: Nondistended, soft and  nontender. Normal bowel sounds heard. Neuro: Alert and oriented. No focal deficits.   Discharge Instructions      Discharge Instructions   Call MD for:  persistant nausea and vomiting    Complete by:  As directed      Call MD for:  severe uncontrolled pain    Complete by:  As directed      Diet - low sodium heart healthy    Complete by:  As directed      Diet Carb Modified    Complete by:  As directed      Increase activity slowly    Complete by:  As directed             Medication List    STOP taking these medications       ibuprofen 200 MG tablet  Commonly known as:  ADVIL,MOTRIN     insulin aspart 100 UNIT/ML injection  Commonly known as:  novoLOG      TAKE these medications       acetaminophen 325 MG tablet  Commonly known as:  TYLENOL  Take 2 tablets (650 mg total) by mouth every 6 (six) hours as needed for mild pain, moderate pain, fever or headache.     insulin aspart protamine- aspart (70-30) 100 UNIT/ML injection  Commonly known as:  NOVOLOG MIX 70/30  Inject 0.2 mLs (20 Units total) into the skin 2 (two) times daily with a meal. May use Relion brand from [place].     Insulin Syringe-Needle U-100 29G X 1/2" 0.3 ML Misc  Commonly known as:  SAFETY-GLIDE 0.3CC SYR 29GX1/2  Use as directed.     omeprazole 20 MG tablet  Commonly known as:  PRILOSEC OTC  Take 1 tablet (20 mg total) by mouth daily.     promethazine 12.5 MG tablet  Commonly known as:  PHENERGAN  Take 1 tablet (12.5 mg total) by mouth every 8 (eight) hours as needed for nausea or vomiting.     sertraline 50 MG tablet  Commonly known as:  ZOLOFT  Take 1 tablet (50 mg total) by mouth daily.       Follow-up Information   Follow up with [facility]     On 05/15/2014. (walk in @ d/c,m-f 9a-6p/bring photo id/$20 co pay/meds in bottles.)    Contact information:   [address] [place] [address] [phone]      The results of significant  diagnostics from this hospitalization (including imaging, microbiology, ancillary and laboratory) are listed below for reference.    Significant Diagnostic Studies: Ct Abdomen Pelvis W Contrast  05/15/2014   CLINICAL DATA:  Abdominal pain, nausea, vomiting and diarrhea. Leukocytosis.  EXAM: CT ABDOMEN AND PELVIS WITH CONTRAST  TECHNIQUE: Multidetector CT imaging of the abdomen and pelvis was performed using the standard protocol following bolus administration of intravenous contrast.  CONTRAST:  OMNIPAQUE IOHEXOL 300 MG/ML  SOLN  COMPARISON:  CT of the abdomen and pelvis performed 04/27/2007, and abdominal radiograph performed 05/14/2014  FINDINGS: The visualized lung bases are clear.  The liver and spleen are unremarkable  in appearance. The gallbladder is within normal limits. The pancreas and adrenal glands are unremarkable.  The kidneys are unremarkable in appearance. There is no evidence of hydronephrosis. No renal or ureteral stones are seen. No perinephric stranding is appreciated.  No free fluid is identified. The small bowel is unremarkable in appearance. The stomach is within normal limits. No acute vascular abnormalities are seen. Minimal calcification is seen at the distal abdominal aorta and its branches.  The appendix is normal in caliber, without evidence for appendicitis. Trace adjacent stranding is nonspecific.  There is vague prominence of the vasculature about the colon, nonspecific in appearance. There may be minimal associated soft tissue inflammation. No definite colonic wall thickening is seen, but this could reflect a mild infectious or inflammatory process.  The bladder is mildly distended and grossly unremarkable. The prostate remains normal in size. No inguinal lymphadenopathy is seen.  No acute osseous abnormalities are identified.  IMPRESSION: Vague prominence of the vasculature about the colon, nonspecific in appearance. There may be minimal associated soft tissue inflammation.  No definite colonic wall thickening is seen, but this raises concern for a mild infectious or inflammatory process, given the patient's symptoms.   Electronically Signed   By: Roanna Raider M.D.   On: 05/15/2014 02:07   Dg Abd Acute W/chest  05/14/2014   CLINICAL DATA:  Nausea, vomiting and diarrhea for 2-3 days.  EXAM: ACUTE ABDOMEN SERIES (ABDOMEN 2 VIEW & CHEST 1 VIEW)  COMPARISON:  Chest radiograph from 11/12/2012  FINDINGS: The lungs are well-aerated and clear. There is no evidence of focal opacification, pleural effusion or pneumothorax. The cardiomediastinal silhouette is within normal limits.  The visualized bowel gas pattern is unremarkable. Minimal stool and air are seen within the colon; there is no evidence of small bowel dilatation to suggest obstruction. No free intra-abdominal air is identified on the provided upright view.  No acute osseous abnormalities are seen; the sacroiliac joints are unremarkable in appearance.  IMPRESSION: 1. Unremarkable bowel gas pattern; no free intra-abdominal air seen. 2. No acute cardiopulmonary process identified.   Electronically Signed   By: Roanna Raider M.D.   On: 05/14/2014 23:04    Microbiology: Recent Results (from the past 240 hour(s))  MRSA PCR SCREENING     Status: None   Collection Time    05/15/14  2:42 AM      Result Value Ref Range Status   MRSA by PCR NEGATIVE  NEGATIVE Final   Comment:            The GeneXpert MRSA Assay (FDA     approved for NASAL specimens     only), is one component of a     comprehensive MRSA colonization     surveillance program. It is not     intended to diagnose MRSA     infection nor to guide or     monitor treatment for     MRSA infections.     Labs: Basic Metabolic Panel:  Recent Labs Lab 05/16/14 0946 05/16/14 1441 05/16/14 1820 05/16/14 2205 05/17/14 0559  NA 140 140 139 137 136*  K 3.9 3.9 4.3 3.3* 3.9  CL 102 103 101 100 101  CO2 GLUCOSE 113* 94 157* 295* 175*  BUN  CREATININE 1.27 1.24 1.18 1.11 1.06  CALCIUM 8.5 8.4 8.4 8.1* 8.1*   Liver Function Tests:  Recent Labs Lab 05/13/14 1129 05/14/14 2117 05/16/14 0946  AST ALT 25 34 25  ALKPHOS 60 81 59  BILITOT 0.8 0.9 1.4*  PROT 5.9* 6.6 4.9*  ALBUMIN 3.3* 3.8 2.8*    Recent Labs Lab 05/13/14 1132 05/14/14 2117 05/16/14 0946  LIPASE No results found for this basename: AMMONIA,  in the last 168 hours CBC:  Recent Labs Lab 05/13/14 1132 05/14/14 2117 05/15/14 0620 05/16/14 0211 05/17/14 0559  WBC 8.7 13.9* 17.9* 11.9* 6.5  NEUTROABS 7.2  --  14.5*  --   --   HGB 14.3 15.6 12.8* 12.7* 13.2  HCT 42.8 46.3 38.1* 37.6* 38.8*  MCV 85.3 85.1 84.3 84.3 83.8  PLT 255 300 277 240 PLATELET CLUMPS NOTED ON SMEAR, UNABLE TO ESTIMATE   Cardiac Enzymes: No results found for this basename: CKTOTAL, CKMB, CKMBINDEX, TROPONINI,  in the last 168 hours BNP: BNP (last 3 results) No results found for this basename: PROBNP,  in the last 8760 hours CBG:  Recent Labs Lab 05/17/14 1650 05/17/14 2130 05/18/14 0222 05/18/14 0732 05/18/14 1128  GLUCAP 109* 231* 415* 198* 245*    Additional labs: 1. ABG on admission: PH 7.206, PCO2 25, PO2 110, bicarbonate 9.4 and oxygen saturation 97%.  2. Hemoglobin A1c: 10.3 -has not been less than 10 since 2008    Signed:  Marcellus Scott, MD, FACP, FHM. [facility] Pager [phone]  If 7PM-7AM, please contact night-coverage [email or web address] Password [email or web address] 05/18/2014, 3:11 PM

## 2014-05-18 NOTE — Progress Notes (Signed)
CARE MANAGEMENT NOTE 05/18/2014  Patient:  John Cox, John Cox   Account Number:  [id number]  Date Initiated:  05/15/2014  Documentation initiated by:  Lanier Clam  Subjective/Objective Assessment:   43 Y/O M ADMITTED W/DKA.     Action/Plan:   FROM HOME.HAS GLUCOMETER.   Anticipated DC Date:  05/18/2014   Anticipated DC Plan:  HOME/SELF CARE  In-house referral  Financial Counselor      DC Planning Services  CM consult  Indigent Health Clinic  Medication Assistance      Choice offered to / List presented to:             Status of service:  Completed, signed off Medicare Important Message given?   (If response is "NO", the following Medicare IM given date fields will be blank) Date Medicare IM given:   Medicare IM given by:   Date Additional Medicare IM given:   Additional Medicare IM given by:    Discharge Disposition:    Per UR Regulation:  Reviewed for med. necessity/level of care/duration of stay  If discussed at Long Length of Stay Meetings, dates discussed:    Comments:  05/18/14 Algernon Huxley RN BSN [phone] I have made an appt for pt at the [facility] for 9/22 :30. I emphasized to him the importance of keeping the appt.  Pt also given a MATCH letter to present to the pharmacy for his insulin. He is aware he needs $3.00 copayment and that the benefit is for once every 12 months.

## 2014-05-18 NOTE — Progress Notes (Signed)
Discharge instructions and medications reviewed with patient. Patient verbalizes understanding and has no questions at this time. Patient confirms he has all personal belongings in his possession at this time. Pt discharged home.

## 2014-05-18 NOTE — Discharge Instructions (Signed)
Diabetic Ketoacidosis °Diabetic ketoacidosis (DKA) is a life-threatening complication of type 1 diabetes. It must be quickly recognized and treated. Treatment requires hospitalization. °CAUSES  °When there is no insulin in the body, glucose (sugar) cannot be used, and the body breaks down fat for energy. When fat breaks down, acids (ketones) build up in the blood. Very high levels of glucose and high levels of acids lead to severe loss of body fluids (dehydration) and other dangerous chemical changes. This stresses your vital organs and can cause coma or death. °SIGNS AND SYMPTOMS  °· Tiredness (fatigue). °· Weight loss. °· Excessive thirst. °· Ketones in your urine. °· Light-headedness. °· Fruity or sweet smelling breath. °· Excessive urination. °· Visual changes. °· Confusion or irritability. °· Nausea or vomiting. °· Rapid breathing. °· Stomachache or abdominal pain. °DIAGNOSIS  °Your health care provider will diagnose DKA based on your history, physical exam, and blood tests. The health care provider will check to see if you have another illness that caused you to go into DKA. Most of this will be done quickly in an emergency room. °TREATMENT  °· Fluid replacement to correct dehydration. °· Insulin. °· Correction of electrolytes, such as potassium and sodium. °· Antibiotic medicines. °PREVENTION °· Always take your insulin. Do not skip your insulin injections. °· If you are sick, treat yourself quickly. Your body often needs more insulin to fight the illness. °· Check your blood glucose regularly. °· Check urine ketones if your blood glucose is greater than 240 milligrams per deciliter (mg/dL). °· Do not use outdated (expired) insulin. °· If your blood glucose is high, drink plenty of fluids. This helps flush out ketones. °HOME CARE INSTRUCTIONS  °· If you are sick, follow the advice of your health care provider. °· To prevent dehydration, drink enough water and fluids to keep your urine clear or pale  yellow. °¨ If you cannot eat, alternate between drinking fluids with sugar (soda, juices, flavored gelatin) and salty fluids (broth, bouillon). °¨ If you can eat, follow your usual diet and drink sugar-free liquids (water, diet drinks). °· Always take your usual dose of insulin. If you cannot eat or if your glucose is getting too low, call your health care provider for further instructions. °· Continue to monitor your blood or urine ketones every 3-4 hours around the clock. Set your alarm clock or have someone wake you up. If you are too sick, have someone test it for you. °· Rest and avoid exercise. °SEEK MEDICAL CARE IF:  °· You have a fever. °· You have ketones in your urine, or your blood glucose is higher than a level your health care provider suggests. You may need extra insulin. Call your health care provider if you need advice on adjusting your insulin. °· You cannot drink at least a tablespoon (15 mL) of fluid every 15-20 minutes. °· You have been vomiting for more than 2 hours. °· You have symptoms of DKA: °¨ Fruity smelling breath. °¨ Breathing faster or slower. °¨ Becoming very sleepy. °SEEK IMMEDIATE MEDICAL CARE IF:  °· You have signs of dehydration: °¨ Decreased urination. °¨ Increased thirst. °¨ Dry skin and mouth. °¨ Light-headedness. °· Your blood glucose is very high (as advised by your health care provider) twice in a row. °· You faint. °· You have chest pain or trouble breathing. °· You have a sudden, severe headache. °· You have sudden weakness in one arm or one leg. °· You have sudden trouble speaking or swallowing. °· You   have vomiting or diarrhea that is getting worse after 3 hours. °· You have abdominal pain. °MAKE SURE YOU:  °· Understand these instructions. °· Will watch your condition. °· Will get help right away if you are not doing well or get worse. °Document Released: 08/15/2000 Document Revised: 08/23/2013 Document Reviewed: 02/21/2009 °ExitCare® Patient Information ©2015 [employer]  LL[employer]. This information is not intended to replace advice given to you by your health care provider. Make sure you discuss any questions you have with your health care provider. ° °

## 2014-05-23 ENCOUNTER — Encounter: Payer: Self-pay | Admitting: Family Medicine

## 2014-05-23 ENCOUNTER — Inpatient Hospital Stay: Payer: Self-pay | Admitting: Family Medicine

## 2014-05-23 ENCOUNTER — Ambulatory Visit: Payer: Self-pay | Attending: Family Medicine | Admitting: Family Medicine

## 2014-05-23 VITALS — BP 121/82 | HR 78 | Temp 98.2°F | Resp 16 | Ht 70.0 in | Wt 167.8 lb

## 2014-05-23 DIAGNOSIS — Z794 Long term (current) use of insulin: Secondary | ICD-10-CM | POA: Insufficient documentation

## 2014-05-23 DIAGNOSIS — E1065 Type 1 diabetes mellitus with hyperglycemia: Secondary | ICD-10-CM | POA: Insufficient documentation

## 2014-05-23 DIAGNOSIS — F17200 Nicotine dependence, unspecified, uncomplicated: Secondary | ICD-10-CM | POA: Insufficient documentation

## 2014-05-23 DIAGNOSIS — E1169 Type 2 diabetes mellitus with other specified complication: Secondary | ICD-10-CM

## 2014-05-23 DIAGNOSIS — R7309 Other abnormal glucose: Secondary | ICD-10-CM

## 2014-05-23 DIAGNOSIS — E109 Type 1 diabetes mellitus without complications: Secondary | ICD-10-CM

## 2014-05-23 DIAGNOSIS — IMO0002 Reserved for concepts with insufficient information to code with codable children: Secondary | ICD-10-CM | POA: Insufficient documentation

## 2014-05-23 DIAGNOSIS — R739 Hyperglycemia, unspecified: Secondary | ICD-10-CM

## 2014-05-23 DIAGNOSIS — E785 Hyperlipidemia, unspecified: Secondary | ICD-10-CM

## 2014-05-23 LAB — POCT URINALYSIS DIPSTICK
Bilirubin, UA: NEGATIVE
Blood, UA: NEGATIVE
Glucose, UA: 500
Ketones, UA: NEGATIVE
Leukocytes, UA: NEGATIVE
NITRITE UA: NEGATIVE
PROTEIN UA: NEGATIVE
Spec Grav, UA: 1.01
Urobilinogen, UA: 0.2
pH, UA: 6

## 2014-05-23 LAB — GLUCOSE, POCT (MANUAL RESULT ENTRY): POC GLUCOSE: 319 mg/dL — AB (ref 70–99)

## 2014-05-23 MED ORDER — INSULIN NPH ISOPHANE & REGULAR (70-30) 100 UNIT/ML ~~LOC~~ SUSP: 25.0000 [IU] | Freq: Two times a day (BID) | SUBCUTANEOUS | Status: DC

## 2014-05-23 MED ORDER — METFORMIN HCL ER 500 MG PO TB24: 1000.0000 mg | ORAL_TABLET | Freq: Every day | ORAL | Status: DC

## 2014-05-23 NOTE — Progress Notes (Signed)
Establish Care  HFU stated had problems with DM

## 2014-05-23 NOTE — Progress Notes (Signed)
   Subjective:    Patient ID: John Cox, male    DOB: Jan 19, 1971, 43 y.o.   MRN: [id number] CC: HFU for diabetes type 1, DKA   HPI 43 year old male presents with his mother to establish care for hospital followup of DKA:  #1 type 1 diabetes: Patient with type 1 diabetes since age 38. For the past 8 years patient to treat himself with over-the-counter insulin or taking his mother's insulin. Last week patient was admitted: Hospital in DKA. He is discharged home with NovoLog 70/30, 20 units twice a day. Reports taking up to 20 units 3 times daily. He denies hypoglycemia. He admits to persistent nausea without emesis. He admits to intermittent tingling in his extremities. he is checking his blood sugars about his meter with him today. Blood sugar range for the past 2 days was 98-220. Patient's appetite is improving.  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

## 2014-05-23 NOTE — Assessment & Plan Note (Addendum)
A: Type 1 diabetes uncontrolled without evidence of ongoing DKA. P: Increase insulin to 25 units of Novolin 70/30 twice daily. With instructions to titrate up 1 unit per dose for her fasting blood sugars greater than 100. Had a metformin 500 mg XR evening for the next 2 weeks then increase to 1000 mg. Close follow up in 2 weeks with nurse review blood sugar log. Followup in 4 weeks with me. Foot exam and lipids done today. Recommended Phenergan/ ginger as needed for ongoing nausea.

## 2014-05-23 NOTE — Assessment & Plan Note (Signed)
A: Current smoker, not yet ready to quit. We discussed smoking cessation tips and medication therapy. P: Discuss further to visit. The patient almost benefit from nicotine replacement and Wellbutrin.  Smoking cessation resources provided.

## 2014-05-23 NOTE — Patient Instructions (Signed)
[name],  Thank you for coming in today. It was a pleasure meeting you. I look forward to being your primary doctor.  1. Regarding diabetes:  Change to humulin 70/30 25 U twice daily Take metformin 500 mg at night for first two weeks, then 1000 mg at night  Check fasting blood sugars daily and write them down (goal is 100), also check sugar before second dose of insulin and write them down, check sugar 2-3 times daily.   If fasting sugar is greater than 100, increase morning insulin dose by 1 Unit per dose, for instance of 200 tomorrow AM take 26 U of insulin, sugars are too < 70 see below, decrease dose by 1 unit.    2. Smoking: Smoking cessation support: smoking cessation hotline: 1-800-QUIT-NOW.  Smoking cessation classes are available through [facility]. Call [phone] or visit our website at [email or web address].  For your diet:  1. Make sure to eat breakfast, lunch and dinner (also may add one snack mid morning or mid afternoon).  2. Carbs: no more than 2 servings (30 gram/2oz) per meal and 1 serving per snack.  3. Exercise such that you sweat some and your heart rate goes up most days of the week.  4. Water, water, water  5. Check blood sugar 2-3 x per week to get a feel for how different foods effect your blood sugar:  Goal fasting 100   Goal after eating < 200 6. Beware of hypoglycemia which is blood sugar < 70 with or without symptoms  Beware of hypoglycemia which is blood sugar less than 70 with or without symptoms.  The common symptoms of hypoglycemia are: sweating, pale or dusty skin, excessive fatigue, nausea, jitteriness. If you experience these symptoms please check your blood sugar.  My blood sugar is low  (less than 70). What should I do?  If low 60- 70, with or without symptoms. Do not take insulin or oral medication,  eat or drink carbohydrates right away (juice, sweets, breads, fruit). Recheck blood sugar in 2 hrs. If still low call  your doctor. If normal take medication.   If 60-40 without symptoms.  Same as above and call your doctor.   If 60-40 with symptoms. Same as above. If symptoms resolve within 30 minutes of eating or drinking carbohydrates call your doctor. If symptoms persist call 911.   If less than 40 with or without symptoms. Same as above. Do not wait 30 minutes, instead call 911.    F/u in 2 weeks with RN for  Blood sugar log review  F/u in 4 weeks with me  Dr. Armen Pickup

## 2014-05-24 DIAGNOSIS — E1169 Type 2 diabetes mellitus with other specified complication: Secondary | ICD-10-CM | POA: Insufficient documentation

## 2014-05-24 DIAGNOSIS — E785 Hyperlipidemia, unspecified: Secondary | ICD-10-CM

## 2014-05-24 LAB — LIPID PANEL
Cholesterol: 215 mg/dL — ABNORMAL HIGH (ref 0–200)
HDL: 67 mg/dL (ref >39)
LDL Cholesterol: 121 mg/dL — ABNORMAL HIGH (ref 0–99)
Total CHOL/HDL Ratio: 3.2 Ratio
Triglycerides: 135 mg/dL (ref <150)
VLDL: 27 mg/dL (ref 0–40)

## 2014-05-24 MED ORDER — ATORVASTATIN CALCIUM 40 MG PO TABS: 40.0000 mg | ORAL_TABLET | Freq: Every day | ORAL | Status: DC

## 2014-05-24 NOTE — Addendum Note (Signed)
Addended by: Dessa Phi on: 05/24/2014 03:39 PM   Modules accepted: Orders

## 2014-05-24 NOTE — Assessment & Plan Note (Signed)
Elevated cholesterol in diabetes, will need to start lipitor 40 mg daily to lower risk of heart disease and stroke.

## 2014-05-25 ENCOUNTER — Telehealth: Payer: Self-pay | Admitting: *Deleted

## 2014-05-25 NOTE — Telephone Encounter (Deleted)
Message copied by Dyann Kief on Thu May 25, 2014 12:29 PM ------      Message from: Dessa Phi      Created: Wed May 24, 2014  3:38 PM       Elevated cholesterol in diabetes, will need to start lipitor 40 mg daily to lower risk of heart disease and stroke. ------

## 2014-05-25 NOTE — Telephone Encounter (Signed)
Left voice massage to return call 

## 2014-05-25 NOTE — Telephone Encounter (Signed)
Message copied by Dyann Kief on Thu May 25, 2014 12:30 PM ------      Message from: Dessa Phi      Created: Wed May 24, 2014  3:38 PM       Elevated cholesterol in diabetes, will need to start lipitor 40 mg daily to lower risk of heart disease and stroke. ------

## 2014-05-30 ENCOUNTER — Ambulatory Visit: Payer: MEDICAID | Attending: Internal Medicine

## 2014-06-07 ENCOUNTER — Other Ambulatory Visit: Payer: Self-pay | Admitting: Internal Medicine

## 2014-06-07 DIAGNOSIS — E119 Type 2 diabetes mellitus without complications: Secondary | ICD-10-CM

## 2014-06-07 MED ORDER — INSULIN NPH ISOPHANE & REGULAR (70-30) 100 UNIT/ML ~~LOC~~ SUSP: 25.0000 [IU] | Freq: Two times a day (BID) | SUBCUTANEOUS | Status: DC

## 2014-10-04 ENCOUNTER — Emergency Department (HOSPITAL_COMMUNITY)
Admission: EM | Admit: 2014-10-04 | Discharge: 2014-10-04 | Disposition: A | Discharge status: Home / Self Care | Payer: Self-pay | Attending: Emergency Medicine | Admitting: Emergency Medicine

## 2014-10-04 ENCOUNTER — Encounter (HOSPITAL_COMMUNITY): Payer: Self-pay | Admitting: *Deleted

## 2014-10-04 DIAGNOSIS — E162 Hypoglycemia, unspecified: Secondary | ICD-10-CM

## 2014-10-04 DIAGNOSIS — F329 Major depressive disorder, single episode, unspecified: Secondary | ICD-10-CM | POA: Insufficient documentation

## 2014-10-04 DIAGNOSIS — Z794 Long term (current) use of insulin: Secondary | ICD-10-CM | POA: Insufficient documentation

## 2014-10-04 DIAGNOSIS — E11649 Type 2 diabetes mellitus with hypoglycemia without coma: Secondary | ICD-10-CM | POA: Insufficient documentation

## 2014-10-04 DIAGNOSIS — Z720 Tobacco use: Secondary | ICD-10-CM | POA: Insufficient documentation

## 2014-10-04 DIAGNOSIS — Z79899 Other long term (current) drug therapy: Secondary | ICD-10-CM | POA: Insufficient documentation

## 2014-10-04 LAB — URINALYSIS, ROUTINE W REFLEX MICROSCOPIC
Glucose, UA: >1000 mg/dL — AB
Hgb urine dipstick: NEGATIVE
Ketones, ur: 15 mg/dL — AB
Leukocytes, UA: NEGATIVE
Nitrite: NEGATIVE
Protein, ur: 30 mg/dL — AB
Specific Gravity, Urine: 1.037 — ABNORMAL HIGH (ref 1.005–1.030)
Urobilinogen, UA: 0.2 mg/dL (ref 0.0–1.0)
pH: 5 (ref 5.0–8.0)

## 2014-10-04 LAB — URINE MICROSCOPIC-ADD ON

## 2014-10-04 LAB — CBC WITH DIFFERENTIAL/PLATELET
BASOS ABS: 0 10*3/uL (ref 0.0–0.1)
BASOS PCT: 0 % (ref 0–1)
Eosinophils Absolute: 0 10*3/uL (ref 0.0–0.7)
Eosinophils Relative: 1 % (ref 0–5)
HCT: 48 % (ref 39.0–52.0)
HEMOGLOBIN: 16.6 g/dL (ref 13.0–17.0)
LYMPHS ABS: 2.1 10*3/uL (ref 0.7–4.0)
Lymphocytes Relative: 29 % (ref 12–46)
MCH: 28.4 pg (ref 26.0–34.0)
MCHC: 34.6 g/dL (ref 30.0–36.0)
MCV: 82.2 fL (ref 78.0–100.0)
Monocytes Absolute: 0.5 10*3/uL (ref 0.1–1.0)
Monocytes Relative: 7 % (ref 3–12)
NEUTROS ABS: 4.5 10*3/uL (ref 1.7–7.7)
NEUTROS PCT: 63 % (ref 43–77)
Platelets: 214 10*3/uL (ref 150–400)
RBC: 5.84 MIL/uL — ABNORMAL HIGH (ref 4.22–5.81)
RDW: 13.1 % (ref 11.5–15.5)
WBC: 7.1 10*3/uL (ref 4.0–10.5)

## 2014-10-04 LAB — BASIC METABOLIC PANEL
ANION GAP: 6 (ref 5–15)
BUN: 10 mg/dL (ref 6–23)
CALCIUM: 8.9 mg/dL (ref 8.4–10.5)
CHLORIDE: 105 mmol/L (ref 96–112)
CO2: 29 mmol/L (ref 19–32)
Creatinine, Ser: 1.15 mg/dL (ref 0.50–1.35)
GFR calc Af Amer: 88 mL/min — ABNORMAL LOW (ref >90)
GFR calc non Af Amer: 76 mL/min — ABNORMAL LOW (ref >90)
Glucose, Bld: 75 mg/dL (ref 70–99)
Potassium: 3.4 mmol/L — ABNORMAL LOW (ref 3.5–5.1)
Sodium: 140 mmol/L (ref 135–145)

## 2014-10-04 LAB — CBG MONITORING, ED
GLUCOSE-CAPILLARY: 34 mg/dL — AB (ref 70–99)
GLUCOSE-CAPILLARY: 208 mg/dL — AB (ref 70–99)
GLUCOSE-CAPILLARY: 149 mg/dL — AB (ref 70–99)
GLUCOSE-CAPILLARY: 97 mg/dL (ref 70–99)
Glucose-Capillary: 156 mg/dL — ABNORMAL HIGH (ref 70–99)
Glucose-Capillary: 106 mg/dL — ABNORMAL HIGH (ref 70–99)
Glucose-Capillary: 206 mg/dL — ABNORMAL HIGH (ref 70–99)

## 2014-10-04 MED ORDER — ONDANSETRON 4 MG PO TBDP
4.0000 mg | ORAL_TABLET | Freq: Once | ORAL | Status: DC
  Filled 2014-10-04: qty 1

## 2014-10-04 MED ORDER — DEXTROSE 50 % IV SOLN
50.0000 mL | Freq: Once | INTRAVENOUS | Status: AC
  Administered 2014-10-04: 50 mL via INTRAVENOUS
  Filled 2014-10-04: qty 50

## 2014-10-04 MED ORDER — HYDROCODONE-ACETAMINOPHEN 5-325 MG PO TABS
2.0000 | ORAL_TABLET | Freq: Once | ORAL | Status: DC
  Filled 2014-10-04: qty 2

## 2014-10-04 NOTE — ED Provider Notes (Signed)
Medical screening examination/treatment/procedure(s) were performed by non-physician practitioner and as supervising physician I was immediately available for consultation/collaboration.   EKG Interpretation None     Patient here with hypoglycemia that responded well to food. Blood sugar stable and patient is good for discharge  Toy BakerAnthony T Rain Friedt, MD 10/04/14 2106

## 2014-10-04 NOTE — ED Notes (Signed)
CBG 206 

## 2014-10-04 NOTE — ED Notes (Signed)
CBG 156 

## 2014-10-04 NOTE — Discharge Instructions (Signed)
Hypoglycemia °Hypoglycemia occurs when the glucose in your blood is too low. Glucose is a type of sugar that is your body's main energy source. Hormones, such as insulin and glucagon, control the level of glucose in the blood. Insulin lowers blood glucose and glucagon increases blood glucose. Having too much insulin in your blood stream, or not eating enough food containing sugar, can result in hypoglycemia. Hypoglycemia can happen to people with or without diabetes. It can develop quickly and can be a medical emergency.  °CAUSES  °· Missing or delaying meals. °· Not eating enough carbohydrates at meals. °· Taking too much diabetes medicine. °· Not timing your oral diabetes medicine or insulin doses with meals, snacks, and exercise. °· Nausea and vomiting. °· Certain medicines. °· Severe illnesses, such as hepatitis, kidney disorders, and certain eating disorders. °· Increased activity or exercise without eating something extra or adjusting medicines. °· Drinking too much alcohol. °· A nerve disorder that affects body functions like your heart rate, blood pressure, and digestion (autonomic neuropathy). °· A condition where the stomach muscles do not function properly (gastroparesis). Therefore, medicines and food may not absorb properly. °· Rarely, a tumor of the pancreas can produce too much insulin. °SYMPTOMS  °· Hunger. °· Sweating (diaphoresis). °· Change in body temperature. °· Shakiness. °· Headache. °· Anxiety. °· Lightheadedness. °· Irritability. °· Difficulty concentrating. °· Dry mouth. °· Tingling or numbness in the hands or feet. °· Restless sleep or sleep disturbances. °· Altered speech and coordination. °· Change in mental status. °· Seizures or prolonged convulsions. °· Combativeness. °· Drowsiness (lethargic). °· Weakness. °· Increased heart rate or palpitations. °· Confusion. °· Pale, gray skin color. °· Blurred or double vision. °· Fainting. °DIAGNOSIS  °A physical exam and medical history will be  performed. Your caregiver may make a diagnosis based on your symptoms. Blood tests and other lab tests may be performed to confirm a diagnosis. Once the diagnosis is made, your caregiver will see if your signs and symptoms go away once your blood glucose is raised.  °TREATMENT  °Usually, you can easily treat your hypoglycemia when you notice symptoms. °· Check your blood glucose. If it is less than 70 mg/dl, take one of the following:   °¨ 3-4 glucose tablets.   °¨ ½ cup juice.   °¨ ½ cup regular soda.   °¨ 1 cup skim milk.   °¨ ½-1 tube of glucose gel.   °¨ 5-6 hard candies.   °· Avoid high-fat drinks or food that may delay a rise in blood glucose levels. °· Do not take more than the recommended amount of sugary foods, drinks, gel, or tablets. Doing so will cause your blood glucose to go too high.   °· Wait 10-15 minutes and recheck your blood glucose. If it is still less than 70 mg/dl or below your target range, repeat treatment.   °· Eat a snack if it is more than 1 hour until your next meal.   °There may be a time when your blood glucose may go so low that you are unable to treat yourself at home when you start to notice symptoms. You may need someone to help you. You may even faint or be unable to swallow. If you cannot treat yourself, someone will need to bring you to the hospital.  °HOME CARE INSTRUCTIONS °· If you have diabetes, follow your diabetes management plan by: °¨ Taking your medicines as directed. °¨ Following your exercise plan. °¨ Following your meal plan. Do not skip meals. Eat on time. °¨ Testing your blood   glucose regularly. Check your blood glucose before and after exercise. If you exercise longer or different than usual, be sure to check blood glucose more frequently. °¨ Wearing your medical alert jewelry that says you have diabetes. °· Identify the cause of your hypoglycemia. Then, develop ways to prevent the recurrence of hypoglycemia. °· Do not take a hot bath or shower right after an  insulin shot. °· Always carry treatment with you. Glucose tablets are the easiest to carry. °· If you are going to drink alcohol, drink it only with meals. °· Tell friends or family members ways to keep you safe during a seizure. This may include removing hard or sharp objects from the area or turning you on your side. °· Maintain a healthy weight. °SEEK MEDICAL CARE IF:  °· You are having problems keeping your blood glucose in your target range. °· You are having frequent episodes of hypoglycemia. °· You feel you might be having side effects from your medicines. °· You are not sure why your blood glucose is dropping so low. °· You notice a change in vision or a new problem with your vision. °SEEK IMMEDIATE MEDICAL CARE IF:  °· Confusion develops. °· A change in mental status occurs. °· The inability to swallow develops. °· Fainting occurs. °Document Released: 08/18/2005 Document Revised: 08/23/2013 Document Reviewed: 12/15/2011 °[employer]® Patient Information ©2015 [employer]. This information is not intended to replace advice given to you by your health care provider. Make sure you discuss any questions you have with your health care provider. ° °

## 2014-10-04 NOTE — ED Notes (Signed)
CBG: 208 

## 2014-10-04 NOTE — ED Provider Notes (Signed)
CSN: [id number]     Arrival date & time 10/04/14  1500 History   First MD Initiated Contact with Patient 10/04/14 1609     Chief Complaint  Patient presents with  . Hypoglycemia     (Consider location/radiation/quality/duration/timing/severity/associated sxs/prior Treatment) HPI   [age] year old male with a past medical history of insulin-dependent diabetes mellitus who presents for hypoglycemia. He states when he awoke this morning he felt jittery and had been sweating heavily in his sleep. He felt that his blood sugar was low and he drinking orange juice. He then ate a sausage biscuit and took his normal insulin dose. He states that about an hour later he began feeling very jittery at again. He left his house to go out and felt worse and worse. He stopped by the emergency department and was found to have a blood sugar of 39. Upon arrival with diaphoresis. He denies any fevers, chills, nausea, vomiting, abdominal pain, symptoms of URI, urinary symptoms. He denies any known infections. [facility]. He states he has not been in a long time and does not know his hemoglobin A1c.  Past Medical History  Diagnosis Date  . Suicidal ideation   . Nausea & vomiting   . Depression 2000  . Diabetes mellitus 1995  . Polysubstance abuse 2012    relapse 2 wk ago    History reviewed. No pertinent past surgical history. Family History  Problem Relation Age of Onset  . Diabetes Mother   . COPD Mother   . Heart disease Mother    History  Substance Use Topics  . Smoking status: Current Every Day Smoker -- 1.00 packs/day  . Smokeless tobacco: Not on file  . Alcohol Use: Yes     Comment: 12 pk on w/e    Review of Systems   Ten systems reviewed and are negative for acute change, except as noted in the HPI.   Allergies  Review of patient's allergies indicates no known allergies.  Home Medications   Prior to Admission medications   Medication Sig  Start Date End Date Taking? Authorizing Provider  acetaminophen (TYLENOL) 325 MG tablet Take 2 tablets (650 mg total) by mouth every 6 (six) hours as needed for mild pain, moderate pain, fever or headache. 05/18/14   Elease EtienneAnand D Hongalgi, MD  atorvastatin (LIPITOR) 40 MG tablet Take 1 tablet (40 mg total) by mouth daily. 05/24/14   Lora PaulaJosalyn C Funches, MD  insulin NPH-regular Human (NOVOLIN 70/30) (70-30) 100 UNIT/ML injection Inject 25 Units into the skin 2 (two) times daily with a meal. 06/07/14   Olugbemiga E Hyman HopesJegede, MD  Insulin Syringe-Needle U-100 (SAFETY-GLIDE 0.3CC SYR 29GX1/2) 29G X 1/2" 0.3 ML MISC Use as directed. 05/18/14   Elease EtienneAnand D Hongalgi, MD  metFORMIN (GLUCOPHAGE XR) 500 MG 24 hr tablet Take 2 tablets (1,000 mg total) by mouth daily with supper. 05/23/14   Lora PaulaJosalyn C Funches, MD  omeprazole (PRILOSEC OTC) 20 MG tablet Take 1 tablet (20 mg total) by mouth daily. 05/18/14   Elease EtienneAnand D Hongalgi, MD  promethazine (PHENERGAN) 12.5 MG tablet Take 1 tablet (12.5 mg total) by mouth every 8 (eight) hours as needed for nausea or vomiting. 05/18/14   Elease EtienneAnand D Hongalgi, MD  sertraline (ZOLOFT) 50 MG tablet Take 1 tablet (50 mg total) by mouth daily. 05/14/14   Shuvon Rankin, NP   BP 113/68 mmHg  Pulse 68  Temp(Src) 98.4 F (36.9 C)  Resp 18  SpO2 99%  Physical Exam  Constitutional: He appears well-developed and well-nourished. No distress.  HENT:  Head: Normocephalic and atraumatic.  Eyes: Conjunctivae are normal. No scleral icterus.  Neck: Normal range of motion. Neck supple.  Cardiovascular: Normal rate, regular rhythm and normal heart sounds.   Pulmonary/Chest: Effort normal and breath sounds normal. No respiratory distress.  Abdominal: Soft. There is no tenderness.  Musculoskeletal: He exhibits no edema.  Neurological: He is alert.  Skin: Skin is warm and dry. He is not diaphoretic.  Psychiatric: His behavior is normal.  Nursing note and vitals reviewed.   ED Course  Procedures (including  critical care time) Labs Review Labs Reviewed  CBC WITH DIFFERENTIAL/PLATELET - Abnormal; Notable for the following:    RBC 5.84 (*)    All other components within normal limits  CBG MONITORING, ED - Abnormal; Notable for the following:    Glucose-Capillary 34 (*)    All other components within normal limits  BASIC METABOLIC PANEL    Imaging Review No results found.   EKG Interpretation None      MDM   Final diagnoses:  None    4:30 PM BP 113/68 mmHg  Pulse 68  Temp(Src) 98.4 F (36.9 C)  Resp 18  SpO2 99%  HEENT here with hypoglycemia. Blood work is pending. Patient denies any alcohol use. He is alert and oriented. Awaiting lab results for his metabolic panel. CBC appears within normal limits. cbg 208  5:08 PM BP 113/68 mmHg  Pulse 68  Temp(Src) 98.4 F (36.9 C)  Resp 18  SpO2 99% Patient cbg 156, NPO cbg monitoring Q30   Patient CABG trending downward. I have ordered him to have a glass of orange juice. Recheck shortly   The patient's CBC came up and is now trending down again. I spoken with Dr. Freida Busman. He suggests feeding the patient a meal. I have ordered the patient food to eat.   Patient's blood sugar is appeared to stabilize. He is seen in shared visit with Dr. Freida Busman. Patient appears safe to discharge. He must follow very closely with his primary care physician to discuss diabetic management and medications. He appears safe for discharge at this time.  Arthor Captain, PA-C 10/06/14 1410

## 2014-10-04 NOTE — ED Notes (Signed)
CBG 34 RN notified

## 2014-10-04 NOTE — ED Notes (Addendum)
The pt is a diabetic and an alcoholic and since yesterdfay his blood sugar has been low.  He last drank alcohol yesterday.  He has not been ill.  At present he has extreme dia=hporesis.  cbg low.  Food given

## 2015-07-15 ENCOUNTER — Emergency Department (HOSPITAL_COMMUNITY)
Admission: EM | Admit: 2015-07-15 | Discharge: 2015-07-15 | Disposition: A | Discharge status: Home / Self Care | Payer: Self-pay | Attending: Emergency Medicine | Admitting: Emergency Medicine

## 2015-07-15 ENCOUNTER — Encounter (HOSPITAL_COMMUNITY): Payer: Self-pay | Admitting: *Deleted

## 2015-07-15 DIAGNOSIS — K047 Periapical abscess without sinus: Secondary | ICD-10-CM | POA: Insufficient documentation

## 2015-07-15 DIAGNOSIS — K002 Abnormalities of size and form of teeth: Secondary | ICD-10-CM | POA: Insufficient documentation

## 2015-07-15 DIAGNOSIS — K029 Dental caries, unspecified: Secondary | ICD-10-CM | POA: Insufficient documentation

## 2015-07-15 DIAGNOSIS — Z79899 Other long term (current) drug therapy: Secondary | ICD-10-CM | POA: Insufficient documentation

## 2015-07-15 DIAGNOSIS — E119 Type 2 diabetes mellitus without complications: Secondary | ICD-10-CM | POA: Insufficient documentation

## 2015-07-15 DIAGNOSIS — Z794 Long term (current) use of insulin: Secondary | ICD-10-CM | POA: Insufficient documentation

## 2015-07-15 DIAGNOSIS — Z720 Tobacco use: Secondary | ICD-10-CM | POA: Insufficient documentation

## 2015-07-15 DIAGNOSIS — F329 Major depressive disorder, single episode, unspecified: Secondary | ICD-10-CM | POA: Insufficient documentation

## 2015-07-15 MED ORDER — PENICILLIN V POTASSIUM 500 MG PO TABS: 500.0000 mg | ORAL_TABLET | Freq: Four times a day (QID) | ORAL | Status: AC

## 2015-07-15 MED ORDER — OXYCODONE-ACETAMINOPHEN 5-325 MG PO TABS: 1.0000 | ORAL_TABLET | Freq: Four times a day (QID) | ORAL | Status: DC | PRN

## 2015-07-15 MED ORDER — PENICILLIN V POTASSIUM 500 MG PO TABS
500.0000 mg | ORAL_TABLET | Freq: Once | ORAL | Status: AC
  Administered 2015-07-15: 500 mg via ORAL
  Filled 2015-07-15: qty 1

## 2015-07-15 MED ORDER — IBUPROFEN 800 MG PO TABS: 800.0000 mg | ORAL_TABLET | Freq: Three times a day (TID) | ORAL | Status: DC

## 2015-07-15 MED ORDER — OXYCODONE-ACETAMINOPHEN 5-325 MG PO TABS
2.0000 | ORAL_TABLET | Freq: Once | ORAL | Status: AC
  Administered 2015-07-15: 2 via ORAL
  Filled 2015-07-15: qty 2

## 2015-07-15 NOTE — Discharge Instructions (Signed)
Dental Abscess °A dental abscess is a collection of pus in or around a tooth. °CAUSES °This condition is caused by a bacterial infection around the root of the tooth that involves the inner part of the tooth (pulp). It may result from: °· Severe tooth decay. °· Trauma to the tooth that allows bacteria to enter into the pulp, such as a broken or chipped tooth. °· Severe gum disease around a tooth. °SYMPTOMS °Symptoms of this condition include: °· Severe pain in and around the infected tooth. °· Swelling and redness around the infected tooth, in the mouth, or in the face. °· Tenderness. °· Pus drainage. °· Bad breath. °· Bitter taste in the mouth. °· Difficulty swallowing. °· Difficulty opening the mouth. °· Nausea. °· Vomiting. °· Chills. °· Swollen neck glands. °· Fever. °DIAGNOSIS °This condition is diagnosed with examination of the infected tooth. During the exam, your dentist may tap on the infected tooth. Your dentist will also ask about your medical and dental history and may order X-rays. °TREATMENT °This condition is treated by eliminating the infection. This may be done with: °· Antibiotic medicine. °· A root canal. This may be performed to save the tooth. °· Pulling (extracting) the tooth. This may also involve draining the abscess. This is done if the tooth cannot be saved. °HOME CARE INSTRUCTIONS °· Take medicines only as directed by your dentist. °· If you were prescribed antibiotic medicine, finish all of it even if you start to feel better. °· Rinse your mouth (gargle) often with salt water to relieve pain or swelling. °· Do not drive or operate heavy machinery while taking pain medicine. °· Do not apply heat to the outside of your mouth. °· Keep all follow-up visits as directed by your dentist. This is important. °SEEK MEDICAL CARE IF: °· Your pain is worse and is not helped by medicine. °SEEK IMMEDIATE MEDICAL CARE IF: °· You have a fever or chills. °· Your symptoms suddenly get worse. °· You have a  very bad headache. °· You have problems breathing or swallowing. °· You have trouble opening your mouth. °· You have swelling in your neck or around your eye. °  °This information is not intended to replace advice given to you by your health care provider. Make sure you discuss any questions you have with your health care provider. °  °Document Released: 08/18/2005 Document Revised: 01/02/2015 Document Reviewed: 08/15/2014 °Elsevier Interactive Patient Education ©2016 Elsevier Inc. ° ° °Dental Pain °Dental pain may be caused by many things, including: °· Tooth decay (cavities or caries). Cavities expose the nerve of your tooth to air and hot or cold temperatures. This can cause pain or discomfort. °· Abscess or infection. A dental abscess is a collection of infected pus from a bacterial infection in the inner part of the tooth (pulp). It usually occurs at the end of the tooth's root. °· Injury. °· An unknown reason (idiopathic). °Your pain may be mild or severe. It may only occur when: °· You are chewing. °· You are exposed to hot or cold temperature. °· You are eating or drinking sugary foods or beverages, such as soda or candy. °Your pain may also be constant. °HOME CARE INSTRUCTIONS °Watch your dental pain for any changes. The following actions may help to lessen any discomfort that you are feeling: °· Take medicines only as directed by your dentist. °· If you were prescribed an antibiotic medicine, finish all of it even if you start to feel better. °· Keep   all follow-up visits as directed by your dentist. This is important. °· Do not apply heat to the outside of your face. °· Rinse your mouth or gargle with salt water if directed by your dentist. This helps with pain and swelling. °¨ You can make salt water by adding ¼ tsp of salt to 1 cup of warm water. °· Apply ice to the painful area of your face: °¨ Put ice in a plastic bag. °¨ Place a towel between your skin and the bag. °¨ Leave the ice on for 20 minutes,  2-3 times per day. °· Avoid foods or drinks that cause you pain, such as: °¨ Very hot or very cold foods or drinks. °¨ Sweet or sugary foods or drinks. °SEEK MEDICAL CARE IF: °· Your pain is not controlled with medicines. °· Your symptoms are worse. °· You have new symptoms. °SEEK IMMEDIATE MEDICAL CARE IF: °· You are unable to open your mouth. °· You are having trouble breathing or swallowing. °· You have a fever. °· Your face, neck, or jaw is swollen. °  °This information is not intended to replace advice given to you by your health care provider. Make sure you discuss any questions you have with your health care provider. °  °Document Released: 08/18/2005 Document Revised: 01/02/2015 Document Reviewed: 08/14/2014 °Elsevier Interactive Patient Education ©2016 Elsevier Inc. ° ° ° °Emergency Department Resource Guide °1) Find a Doctor and Pay Out of Pocket °Although you won't have to find out who is covered by your insurance plan, it is a good idea to ask around and get recommendations. You will then need to call the office and see if the doctor you have chosen will accept you as a new patient and what types of options they offer for patients who are self-pay. Some doctors offer discounts or will set up payment plans for their patients who do not have insurance, but you will need to ask so you aren't surprised when you get to your appointment. ° °2) Contact Your Local Health Department °Not all health departments have doctors that can see patients for sick visits, but many do, so it is worth a call to see if yours does. If you don't know where your local health department is, you can check in your phone book. The CDC also has a tool to help you locate your state's health department, and many state websites also have listings of all of their local health departments. ° °3) Find a Walk-in Clinic °If your illness is not likely to be very severe or complicated, you may want to try a walk in clinic. These are popping up  all over the country in pharmacies, drugstores, and shopping centers. They're usually staffed by nurse practitioners or physician assistants that have been trained to treat common illnesses and complaints. They're usually fairly quick and inexpensive. However, if you have serious medical issues or chronic medical problems, these are probably not your best option. ° °No Primary Care Doctor: °- Call [facility] at  832-8000 - they can help you locate a primary care doctor that  accepts your insurance, provides certain services, etc. °- Physician Referral Service- 1-800-533-3463 ° °Chronic Pain Problems: °Organization         Address  Phone   Notes  °[facility]  (336) 297-2271 Patients need to be referred by their primary care doctor.  ° °Medication Assistance: °Organization         Address  Phone   Notes  °  [place] [facility] [address] °[place], [place] [address] (336) 641-8030 --Must be a resident of [place] °-- Must have NO insurance coverage whatsoever (no Medicaid/ Medicare, etc.) °-- The pt. MUST have a primary care doctor that directs their care regularly and follows them in the community °  °[facility]  (866) 331-1348   °[employer]  (888) 892-1162   ° °Agencies that provide inexpensive medical care: °Organization         Address  Phone   Notes  °[facility]  (336) 832-8035   °[facility]    (336) 832-7272   °[facility] [address] °[place], [place] [address] (336) 832-4777   °[facility] [address], °[place] (336) 271-4999   °[facility]    (336) 373-0678   °[facility]    (336) 272-1050   °[facility] ° [address], [place] Phone:  (336) 832-4444, Fax:  (336) 832-4440 Hours of Operation:  9 am - 6 pm, M-F.  Also accepts Medicaid/Medicare and self-pay.  °[facility] ° [address], [place] Phone: (336) 832-3150, Fax: (336) 832-3151. Hours of Operation:  8:30 am - 5:30 pm, M-F.  Also accepts Medicaid and self-pay.  °[facility] [place] [address], [place] Phone: (336) 878-6027   °[facility] [address], [place], [place] (336)723-1848, Ext. 123 Mondays & Thursdays: 7-9 AM.  First 15 patients are seen on a first come, first serve basis. °  ° °Medicaid-accepting [place] Providers: ° °Organization         Address  Phone   Notes  °[facility] [address], [place] (336) 641-2100 Also accepts self-pay patients.  °[facility] [address], [place] ° (336) 856-9996   °[facility] [address], [place] (336) 288-8857   °[facility] [address] [place] [address], [place] (336) 299-7000   °Veita Bland [address], [place]  ° (336) 373-1557 Only accepts [place] Access Medicaid patients after they have their name applied to their card.  ° °Self-Pay (no insurance) in [place]: ° °Organization         Address  Phone   Notes  °Sickle Cell Patients, [facility] [address], [place] (336) 832-1970   °[facility] [address], [place] (336) 832-4400   °[facility] ° [address], [place] (336) 992-4800   °[facility]/Dr. Osei-Bonsu ° [address] [place] [address], [place] or [address], [place] (336) [phone] Phone number for both [place] and [place] locations is the same.  °[facility] [address], [place] (336) 299-0000   °[facility] [address] [place] [address], [place] or [address] (336) 852-7530 °(336) 878-2260   °[facility] [address],  (336) 350-1642, phone; (336) 294-5005, fax Sees patients 1st and 3rd Saturday of every  month.  Must not qualify for public or private insurance (i.e. Medicaid, Medicare, [place] Health Choice, Veterans' Benefits) • Household income should be no more than 200% of the poverty level •The clinic cannot treat you if you are pregnant or think you are pregnant • Sexually transmitted diseases are   not treated at the clinic.  ° ° °Dental Care: °Organization         Address  Phone  Notes  °[facility] [facility] [address], [place] (336) 641-6152 Accepts children up to age 21 who are enrolled in Medicaid or [place] Health Choice; pregnant women with a Medicaid card; and children who have applied for Medicaid or [place] Health Choice, but were declined, whose parents can pay a reduced fee at time of service.  °[facility] [place]  [address], [place] (336) 641-7733 Accepts children up to age 21 who are enrolled in Medicaid or [place] Health Choice; pregnant women with a Medicaid card; and children who have applied for Medicaid or [place] Health Choice, but were declined, whose parents can pay a reduced fee at time of service.  °[facility] ° [address], [place] (336) 641-4533 Patients are seen by appointment only. Walk-ins are not accepted. [facility] will see patients 18 years of age and older. °Monday - Tuesday (8am-5pm) °Most Wednesdays (8:30-5pm) °$30 per visit, cash only  °[facility] ° [address], [place] (336) 641-4533 Patients are seen by appointment only. Walk-ins are not accepted. [facility] will see patients 18 years of age and older. °One Wednesday Evening (Monthly: Volunteer Based).  $30 per visit, cash only  °[facility]  (919) 537-3737 for adults; Children under age 4, call Graduate Pediatric Dentistry at ([phone]) [phone]. Children aged 4-14, please call (919) [phone] to request a pediatric application. ° Dental  services are provided in all areas of dental care including fillings, crowns and bridges, complete and partial dentures, implants, gum treatment, root canals, and extractions. Preventive care is also provided. Treatment is provided to both adults and children. °Patients are selected via a lottery and there is often a waiting list. °  °[facility] [address], °[place] ° (336) 763-8833 [email or web address] °  °[facility] [address], [place], [place] (336)723-1848, Ext. 123 Second and Fourth Thursday of each month, opens at 6:30 AM; Clinic ends at 9 AM.  Patients are seen on a first-come first-served basis, and a limited number are seen during each clinic.  ° °[facility] ° [address], [place], [place] (336) 723-7904   Eligibility Requirements °You must have lived in [place], [place], or [place] counties for at least the last three months. °  You cannot be eligible for state or federal sponsored healthcare insurance, including Veterans Administration, Medicaid, or Medicare. °  You generally cannot be eligible for healthcare insurance through your employer.  °  How to apply: °Eligibility screenings are held every Tuesday and Wednesday afternoon from 1:00 pm until 4:00 pm. You do not need an appointment for the interview!  °[facility] [address], [place], [place] 336-631-2330   °[facility] [facility]  336-342-8273   °[facility]  336-703-3100   °[facility]  336-570-6415   ° °Behavioral Health Resources in the Community: °Intensive Outpatient Programs °Organization         Address  Phone  Notes  °[facility] [address], [place], [place] 336-878-6098   °[facility] [address], [place], [place] 336-832-9800   °[facility] [address], [place], [place] ° 336-882-2125   °[facility] [address],    °[place], [place] 1-800-853-5163 or 336-641-4981   °Substance Abuse Resources °Organization         Address  Phone  Notes  °[facility]  336-882-2125   °[facility]  336-784-9470   °[facility]  336-285-9073   °[facility]  336-845-3988   °Residential & Outpatient Substance Abuse Program  1-800-659-3381   °Psychological Services °Organization         Address  Phone  Notes  °[facility]  336- 832-9600   °[facility]  336- 378-7881   °[facility] [address], [place] 1-800-853-5163 or 336-641-4981   ° °Mobile Crisis Teams °Organization         Address  Phone  Notes  °[facility], Mobile Crisis Care Unit  1-877-626-1772   °Assertive °[facility] ° [address] [place], [place] 336-834-9664   °Sharon DeEsch [address] °[place] 336-554-5454   ° °Self-Help/Support Groups °Organization         Address  Phone             Notes  °[facility] - variety of support groups  336- 373-1402 Call for more information  °Narcotics Anonymous (NA), [facility] [address], °[place]  2 meetings at this location  ° °Residential Treatment Programs °Organization         Address  Phone  Notes  °[facility] [address],    °[place]  1-866-801-8205   °[facility] ° [address], [place], [place] 704-293-8524   °[facility] [address], [place] 336-845-3988 Admissions: 8am-3pm M-F  °[facility] [address]    °[place], [place] 336-841-1104   °[facility] [address], [place], [place] 336-379-7146   °[facility] [address]  °[place], [place] 336-285-9073   °[facility] - Intensive Outpatient [address], [place], [place] 336-852-3033   °[facility] [address]  °[place], [place]  1-877-615-2722 or 336-784-9470   °[facility] [address] [place], [place] 336-227-7417 Accepts Medicaid  °[facility] [address]  ° [place] 1-800-659-3381 Substance Abuse/Addiction Treatment  ° °[place] Behavioral Health Resources °Organization         Address  Phone  Notes  °[facility]  (888) 581-9988   °Julie Brannon, PhD [address] [place], [place]   (336) 349-5553 or (336) 951-0000   °[facility]   [address] °[place], [place] (336) 349-4454   °[facility] [address], [place], [place] (336) 342-8316 Insurance/Medicaid/sponsorship through [facility]  °[facility] [address]                                    [place], [place] (336) 342-8316 Therapy/tele-psych/case  °[facility] [address]  ° [place], [place] (336) 349-2233    °Dr. Arfeen  (336) 349-4544   °[facility] [place]  [employer] [place] [facility] 1) [address], [place] °2) [address], [place] °3)  [address], [place] (336) 349-3220 °(336) 342-7768 ° °(336) 342-8140   °[place] [facility] (336) 342-1394 or (336) 342-3537 (After Hours)    ° ° ° °

## 2015-07-15 NOTE — ED Provider Notes (Signed)
CSN: [id number]     Arrival date & time 07/15/15  1027 History   First MD Initiated Contact with Patient 07/15/15 1109     Chief Complaint  Patient presents with  . Dental Pain     (Consider location/radiation/quality/duration/timing/severity/associated sxs/prior Treatment) HPI   Patient is a 44 year old male who presents to the ER with 1 week of progressively worsening dental pain with associated right cheek swelling. He states that he has been unable to be evaluated by dentist and despite taking ibuprofen and Goody powders his pain has progressed. The tooth is located in the right upper jaw and pain radiates from there to his right temple right ear right cheekbone and right cheek and jaw.  He feels a swelling on the inside of his cheek but did not notice any swelling on the outside of his face.  He has been unable to eat or drink on the right side it is extremely sensitive to hot or cold. He denies fever, chills, sweats, nausea, vomiting, abdominal pain, headache. He denies any swelling to his tong or his throat and denies any difficulty breathing, swallowing or clearing secretions.  Past Medical History  Diagnosis Date  . Suicidal ideation   . Nausea & vomiting   . Depression 2000  . Diabetes mellitus 1995  . Polysubstance abuse 2012    relapse 2 wk ago    History reviewed. No pertinent past surgical history. Family History  Problem Relation Age of Onset  . Diabetes Mother   . COPD Mother   . Heart disease Mother    Social History  Substance Use Topics  . Smoking status: Current Every Day Smoker -- 1.00 packs/day  . Smokeless tobacco: None  . Alcohol Use: Yes     Comment: 12 pk on w/e    Review of Systems  Constitutional: Negative.  Negative for fever, chills, diaphoresis and fatigue.  HENT: Positive for dental problem, ear pain and facial swelling. Negative for mouth sores, postnasal drip, rhinorrhea, sore throat, tinnitus, trouble swallowing and voice change.   Eyes:  Negative.   Respiratory: Negative.   Gastrointestinal: Negative.  Negative for nausea, vomiting and diarrhea.  Endocrine: Negative.   Genitourinary: Negative.   Musculoskeletal: Negative.   Skin: Negative.   Allergic/Immunologic: Negative.   Neurological: Negative.   Psychiatric/Behavioral: Negative.       Allergies  Review of patient's allergies indicates no known allergies.  Home Medications   Prior to Admission medications   Medication Sig Start Date End Date Taking? Authorizing Provider  acetaminophen (TYLENOL) 325 MG tablet Take 2 tablets (650 mg total) by mouth every 6 (six) hours as needed for mild pain, moderate pain, fever or headache. 05/18/14   Elease Etienne, MD  atorvastatin (LIPITOR) 40 MG tablet Take 1 tablet (40 mg total) by mouth daily. 05/24/14   Josalyn Funches, MD  ibuprofen (ADVIL,MOTRIN) 800 MG tablet Take 1 tablet (800 mg total) by mouth 3 (three) times daily. 07/15/15   Danelle Berry, PA-C  insulin NPH-regular Human (NOVOLIN 70/30) (70-30) 100 UNIT/ML injection Inject 25 Units into the skin 2 (two) times daily with a meal. 06/07/14   Olugbemiga E Hyman Hopes, MD  Insulin Syringe-Needle U-100 (SAFETY-GLIDE 0.3CC SYR 29GX1/2) 29G X 1/2" 0.3 ML MISC Use as directed. 05/18/14   Elease Etienne, MD  metFORMIN (GLUCOPHAGE XR) 500 MG 24 hr tablet Take 2 tablets (1,000 mg total) by mouth daily with supper. 05/23/14   Josalyn Funches, MD  omeprazole (PRILOSEC OTC) 20 MG tablet Take  1 tablet (20 mg total) by mouth daily. Patient not taking: Reported on 10/04/2014 05/18/14   Elease EtienneAnand D Hongalgi, MD  oxyCODONE-acetaminophen (PERCOCET) 5-325 MG tablet Take 1-2 tablets by mouth every 6 (six) hours as needed for severe pain. 07/15/15   Danelle BerryLeisa Crosby Oriordan, PA-C  penicillin v potassium (VEETID) 500 MG tablet Take 1 tablet (500 mg total) by mouth 4 (four) times daily. 07/15/15 07/22/15  Danelle BerryLeisa Cher Franzoni, PA-C  promethazine (PHENERGAN) 12.5 MG tablet Take 1 tablet (12.5 mg total) by mouth every 8 (eight)  hours as needed for nausea or vomiting. Patient not taking: Reported on 10/04/2014 05/18/14   Elease EtienneAnand D Hongalgi, MD  sertraline (ZOLOFT) 50 MG tablet Take 1 tablet (50 mg total) by mouth daily. Patient not taking: Reported on 10/04/2014 05/14/14   Shuvon B Rankin, NP   BP 143/78 mmHg  Pulse 70  Temp(Src) 98.1 F (36.7 C) (Oral)  Resp 16  SpO2 96% Physical Exam  Constitutional: He is oriented to person, place, and time. He appears well-developed and well-nourished. No distress.  HENT:  Head: Normocephalic and atraumatic.    Right Ear: External ear normal.  Left Ear: External ear normal.  Nose: Nose normal.  Mouth/Throat: Uvula is midline, oropharynx is clear and moist and mucous membranes are normal. Mucous membranes are not pale, not dry and not cyanotic. No oral lesions. No trismus in the jaw. Abnormal dentition. Dental caries present. No dental abscesses, uvula swelling or lacerations. No oropharyngeal exudate, posterior oropharyngeal edema, posterior oropharyngeal erythema or tonsillar abscesses.    No dental abscess palpated, gumline over tooth #2 extremely tender to palpation, tenderness and swelling to right vehicle mucosa, no sublingual tenderness, no tenderness to mandible and no palpated submandibular lymphadenopathy, no erythema or edema of neck or throat  Eyes: Conjunctivae and EOM are normal. Pupils are equal, round, and reactive to light. Right eye exhibits no discharge. Left eye exhibits no discharge. No scleral icterus.  Neck: Normal range of motion. Neck supple. No JVD present. No tracheal deviation present.  Cardiovascular: Normal rate, regular rhythm and normal heart sounds.  Exam reveals no gallop and no friction rub.   No murmur heard. Pulmonary/Chest: Effort normal and breath sounds normal. No stridor. No respiratory distress. He has no wheezes. He has no rales. He exhibits no tenderness.  Abdominal: Soft. Bowel sounds are normal. He exhibits no distension. There is no  tenderness.  Musculoskeletal: Normal range of motion. He exhibits no edema.  Lymphadenopathy:    He has no cervical adenopathy.  Neurological: He is alert and oriented to person, place, and time. He exhibits normal muscle tone. Coordination normal.  Skin: Skin is warm and dry. No rash noted. He is not diaphoretic. No erythema. No pallor.  Psychiatric: He has a normal mood and affect. His behavior is normal. Judgment and thought content normal.    ED Course  Procedures (including critical care time) Labs Review Labs Reviewed - No data to display  Imaging Review No results found. I have personally reviewed and evaluated these images and lab results as part of my medical decision-making.   EKG Interpretation None      MDM   Final diagnoses:  Periapical abscess    Patient with toothache.  No gross abscess.  Exam unconcerning for Ludwig's angina or spread of infection.  Will treat with penicillin and pain medicine.  Urged patient to follow-up with dentist.       Danelle BerryLeisa Shifa Brisbon, PA-C 07/15/15 1349  Tilden FossaElizabeth Rees, MD 07/15/15 [phone]

## 2015-07-15 NOTE — ED Notes (Signed)
Pt reports excruciating pain on the right upper side, sts it's been going on for a week, taking ibuprofen and tylenol without any relief. Pt sts pain is 10/10, throbbing radiating to head and eyes, stst he can feel swelling inside his mouth.

## 2015-07-18 ENCOUNTER — Emergency Department (HOSPITAL_COMMUNITY): Payer: Self-pay

## 2015-07-18 ENCOUNTER — Inpatient Hospital Stay (HOSPITAL_COMMUNITY)
Admission: EM | Admit: 2015-07-18 | Discharge: 2015-07-19 | DRG: 638 | Disposition: A | Discharge status: Home / Self Care | Payer: Self-pay | Attending: Internal Medicine | Admitting: Internal Medicine

## 2015-07-18 ENCOUNTER — Encounter (HOSPITAL_COMMUNITY): Payer: Self-pay | Admitting: Emergency Medicine

## 2015-07-18 DIAGNOSIS — Z794 Long term (current) use of insulin: Secondary | ICD-10-CM

## 2015-07-18 DIAGNOSIS — E1169 Type 2 diabetes mellitus with other specified complication: Secondary | ICD-10-CM | POA: Diagnosis present

## 2015-07-18 DIAGNOSIS — E785 Hyperlipidemia, unspecified: Secondary | ICD-10-CM | POA: Diagnosis present

## 2015-07-18 DIAGNOSIS — E860 Dehydration: Secondary | ICD-10-CM | POA: Diagnosis present

## 2015-07-18 DIAGNOSIS — Z833 Family history of diabetes mellitus: Secondary | ICD-10-CM

## 2015-07-18 DIAGNOSIS — E119 Type 2 diabetes mellitus without complications: Secondary | ICD-10-CM

## 2015-07-18 DIAGNOSIS — N179 Acute kidney failure, unspecified: Secondary | ICD-10-CM | POA: Diagnosis present

## 2015-07-18 DIAGNOSIS — E1310 Other specified diabetes mellitus with ketoacidosis without coma: Principal | ICD-10-CM | POA: Diagnosis present

## 2015-07-18 DIAGNOSIS — R112 Nausea with vomiting, unspecified: Secondary | ICD-10-CM | POA: Diagnosis present

## 2015-07-18 DIAGNOSIS — Z6822 Body mass index (BMI) 22.0-22.9, adult: Secondary | ICD-10-CM

## 2015-07-18 DIAGNOSIS — F17200 Nicotine dependence, unspecified, uncomplicated: Secondary | ICD-10-CM | POA: Diagnosis present

## 2015-07-18 DIAGNOSIS — E440 Moderate protein-calorie malnutrition: Secondary | ICD-10-CM | POA: Diagnosis present

## 2015-07-18 DIAGNOSIS — Z79899 Other long term (current) drug therapy: Secondary | ICD-10-CM

## 2015-07-18 DIAGNOSIS — E875 Hyperkalemia: Secondary | ICD-10-CM | POA: Diagnosis present

## 2015-07-18 DIAGNOSIS — K029 Dental caries, unspecified: Secondary | ICD-10-CM | POA: Diagnosis present

## 2015-07-18 DIAGNOSIS — Z8249 Family history of ischemic heart disease and other diseases of the circulatory system: Secondary | ICD-10-CM

## 2015-07-18 DIAGNOSIS — Z79891 Long term (current) use of opiate analgesic: Secondary | ICD-10-CM

## 2015-07-18 DIAGNOSIS — R197 Diarrhea, unspecified: Secondary | ICD-10-CM | POA: Diagnosis present

## 2015-07-18 DIAGNOSIS — E1010 Type 1 diabetes mellitus with ketoacidosis without coma: Secondary | ICD-10-CM

## 2015-07-18 DIAGNOSIS — E1110 Type 2 diabetes mellitus with ketoacidosis without coma: Secondary | ICD-10-CM | POA: Diagnosis present

## 2015-07-18 DIAGNOSIS — F339 Major depressive disorder, recurrent, unspecified: Secondary | ICD-10-CM | POA: Diagnosis present

## 2015-07-18 DIAGNOSIS — F1210 Cannabis abuse, uncomplicated: Secondary | ICD-10-CM | POA: Diagnosis present

## 2015-07-18 DIAGNOSIS — Z825 Family history of asthma and other chronic lower respiratory diseases: Secondary | ICD-10-CM

## 2015-07-18 LAB — URINALYSIS, ROUTINE W REFLEX MICROSCOPIC
BILIRUBIN URINE: NEGATIVE
Glucose, UA: >1000 mg/dL — AB
Hgb urine dipstick: NEGATIVE
Ketones, ur: >80 mg/dL — AB
LEUKOCYTES UA: NEGATIVE
NITRITE: NEGATIVE
PH: 5 (ref 5.0–8.0)
Protein, ur: NEGATIVE mg/dL
SPECIFIC GRAVITY, URINE: 1.028 (ref 1.005–1.030)

## 2015-07-18 LAB — BASIC METABOLIC PANEL
ANION GAP: 14 (ref 5–15)
Anion gap: 17 — ABNORMAL HIGH (ref 5–15)
Anion gap: 8 (ref 5–15)
BUN: 30 mg/dL — AB (ref 6–20)
BUN: 30 mg/dL — ABNORMAL HIGH (ref 6–20)
BUN: 28 mg/dL — ABNORMAL HIGH (ref 6–20)
CALCIUM: 8.2 mg/dL — AB (ref 8.9–10.3)
CALCIUM: 8.1 mg/dL — AB (ref 8.9–10.3)
CHLORIDE: 105 mmol/L (ref 101–111)
CO2: 15 mmol/L — ABNORMAL LOW (ref 22–32)
CO2: 16 mmol/L — ABNORMAL LOW (ref 22–32)
CO2: 22 mmol/L (ref 22–32)
CREATININE: 1.29 mg/dL — AB (ref 0.61–1.24)
Calcium: 8.5 mg/dL — ABNORMAL LOW (ref 8.9–10.3)
Chloride: 110 mmol/L (ref 101–111)
Chloride: 111 mmol/L (ref 101–111)
Creatinine, Ser: 1.52 mg/dL — ABNORMAL HIGH (ref 0.61–1.24)
Creatinine, Ser: 1.38 mg/dL — ABNORMAL HIGH (ref 0.61–1.24)
GFR calc Af Amer: >60 mL/min (ref >60)
GFR calc non Af Amer: 54 mL/min — ABNORMAL LOW (ref >60)
GFR calc non Af Amer: >60 mL/min (ref >60)
GLUCOSE: 416 mg/dL — AB (ref 65–99)
GLUCOSE: 285 mg/dL — AB (ref 65–99)
Glucose, Bld: 179 mg/dL — ABNORMAL HIGH (ref 65–99)
POTASSIUM: 5.4 mmol/L — AB (ref 3.5–5.1)
POTASSIUM: 5 mmol/L (ref 3.5–5.1)
Potassium: 4.4 mmol/L (ref 3.5–5.1)
SODIUM: 141 mmol/L (ref 135–145)
Sodium: 137 mmol/L (ref 135–145)
Sodium: 140 mmol/L (ref 135–145)

## 2015-07-18 LAB — CBC
HCT: 43.5 % (ref 39.0–52.0)
HEMATOCRIT: 47.1 % (ref 39.0–52.0)
Hemoglobin: 15.2 g/dL (ref 13.0–17.0)
Hemoglobin: 14.2 g/dL (ref 13.0–17.0)
MCH: 28 pg (ref 26.0–34.0)
MCH: 28.1 pg (ref 26.0–34.0)
MCHC: 32.3 g/dL (ref 30.0–36.0)
MCHC: 32.6 g/dL (ref 30.0–36.0)
MCV: 86.7 fL (ref 78.0–100.0)
MCV: 86 fL (ref 78.0–100.0)
PLATELETS: 277 10*3/uL (ref 150–400)
Platelets: 284 10*3/uL (ref 150–400)
RBC: 5.43 MIL/uL (ref 4.22–5.81)
RBC: 5.06 MIL/uL (ref 4.22–5.81)
RDW: 13.4 % (ref 11.5–15.5)
RDW: 13.6 % (ref 11.5–15.5)
WBC: 13 10*3/uL — ABNORMAL HIGH (ref 4.0–10.5)
WBC: 13.2 10*3/uL — AB (ref 4.0–10.5)

## 2015-07-18 LAB — HEPATIC FUNCTION PANEL
ALK PHOS: 55 U/L (ref 38–126)
ALT: 35 U/L (ref 17–63)
AST: 35 U/L (ref 15–41)
Albumin: 3.4 g/dL — ABNORMAL LOW (ref 3.5–5.0)
Bilirubin, Direct: 0.3 mg/dL (ref 0.1–0.5)
Indirect Bilirubin: 1.6 mg/dL — ABNORMAL HIGH (ref 0.3–0.9)
TOTAL PROTEIN: 5.6 g/dL — AB (ref 6.5–8.1)
Total Bilirubin: 1.9 mg/dL — ABNORMAL HIGH (ref 0.3–1.2)

## 2015-07-18 LAB — TYPE AND SCREEN
ABO/RH(D): A POS
ANTIBODY SCREEN: NEGATIVE

## 2015-07-18 LAB — GLUCOSE, CAPILLARY
GLUCOSE-CAPILLARY: 159 mg/dL — AB (ref 65–99)
Glucose-Capillary: 263 mg/dL — ABNORMAL HIGH (ref 65–99)
Glucose-Capillary: 182 mg/dL — ABNORMAL HIGH (ref 65–99)
Glucose-Capillary: 127 mg/dL — ABNORMAL HIGH (ref 65–99)
Glucose-Capillary: 96 mg/dL (ref 65–99)
Glucose-Capillary: 97 mg/dL (ref 65–99)

## 2015-07-18 LAB — ABO/RH: ABO/RH(D): A POS

## 2015-07-18 LAB — LIPASE, BLOOD: LIPASE: 19 U/L (ref 11–51)

## 2015-07-18 LAB — CBG MONITORING, ED
GLUCOSE-CAPILLARY: 259 mg/dL — AB (ref 65–99)
Glucose-Capillary: 420 mg/dL — ABNORMAL HIGH (ref 65–99)
Glucose-Capillary: 401 mg/dL — ABNORMAL HIGH (ref 65–99)
Glucose-Capillary: 283 mg/dL — ABNORMAL HIGH (ref 65–99)

## 2015-07-18 LAB — URINE MICROSCOPIC-ADD ON: BACTERIA UA: NONE SEEN

## 2015-07-18 MED ORDER — INSULIN ASPART 100 UNIT/ML ~~LOC~~ SOLN
7.0000 [IU] | Freq: Once | SUBCUTANEOUS | Status: AC
  Administered 2015-07-18: 7 [IU] via INTRAVENOUS
  Filled 2015-07-18: qty 1

## 2015-07-18 MED ORDER — ACETAMINOPHEN 325 MG PO TABS: 650.0000 mg | ORAL_TABLET | Freq: Four times a day (QID) | ORAL | Status: DC | PRN

## 2015-07-18 MED ORDER — SODIUM CHLORIDE 0.9 % IV SOLN: INTRAVENOUS | Status: DC

## 2015-07-18 MED ORDER — SODIUM CHLORIDE 0.9 % IV SOLN
INTRAVENOUS | Status: DC
  Administered 2015-07-18: 2 [IU]/h via INTRAVENOUS
  Filled 2015-07-18: qty 2.5

## 2015-07-18 MED ORDER — DEXTROSE-NACL 5-0.45 % IV SOLN
INTRAVENOUS | Status: DC
  Administered 2015-07-18: 23:00:00 via INTRAVENOUS

## 2015-07-18 MED ORDER — PANTOPRAZOLE SODIUM 40 MG IV SOLR
40.0000 mg | Freq: Two times a day (BID) | INTRAVENOUS | Status: DC
  Administered 2015-07-18 – 2015-07-19 (×2): 40 mg via INTRAVENOUS
  Filled 2015-07-18 (×2): qty 40

## 2015-07-18 MED ORDER — SODIUM CHLORIDE 0.9 % IV SOLN
INTRAVENOUS | Status: DC
  Administered 2015-07-18: 19:00:00 via INTRAVENOUS
  Filled 2015-07-18: qty 2.5

## 2015-07-18 MED ORDER — INSULIN ASPART PROT & ASPART (70-30 MIX) 100 UNIT/ML ~~LOC~~ SUSP
25.0000 [IU] | Freq: Every day | SUBCUTANEOUS | Status: DC
  Administered 2015-07-18: 25 [IU] via SUBCUTANEOUS
  Filled 2015-07-18: qty 10

## 2015-07-18 MED ORDER — ONDANSETRON HCL 4 MG/2ML IJ SOLN: 4.0000 mg | Freq: Four times a day (QID) | INTRAMUSCULAR | Status: DC | PRN

## 2015-07-18 MED ORDER — CETYLPYRIDINIUM CHLORIDE 0.05 % MT LIQD
7.0000 mL | Freq: Two times a day (BID) | OROMUCOSAL | Status: DC
  Administered 2015-07-19: 7 mL via OROMUCOSAL

## 2015-07-18 MED ORDER — SODIUM CHLORIDE 0.9 % IV SOLN
1000.0000 mL | Freq: Once | INTRAVENOUS | Status: AC
  Administered 2015-07-18: 1000 mL via INTRAVENOUS

## 2015-07-18 MED ORDER — MORPHINE SULFATE (PF) 2 MG/ML IV SOLN
2.0000 mg | INTRAVENOUS | Status: DC | PRN
  Administered 2015-07-18 – 2015-07-19 (×4): 2 mg via INTRAVENOUS
  Filled 2015-07-18 (×4): qty 1

## 2015-07-18 MED ORDER — CHLORHEXIDINE GLUCONATE 0.12 % MT SOLN
15.0000 mL | Freq: Two times a day (BID) | OROMUCOSAL | Status: DC
  Administered 2015-07-18 – 2015-07-19 (×2): 15 mL via OROMUCOSAL
  Filled 2015-07-18 (×2): qty 15

## 2015-07-18 MED ORDER — DEXTROSE-NACL 5-0.45 % IV SOLN: INTRAVENOUS | Status: DC

## 2015-07-18 MED ORDER — PENICILLIN V POTASSIUM 500 MG PO TABS
500.0000 mg | ORAL_TABLET | Freq: Four times a day (QID) | ORAL | Status: DC
  Administered 2015-07-18 – 2015-07-19 (×2): 500 mg via ORAL
  Filled 2015-07-18 (×6): qty 1

## 2015-07-18 MED ORDER — ONDANSETRON HCL 4 MG/2ML IJ SOLN
4.0000 mg | Freq: Once | INTRAMUSCULAR | Status: AC
  Administered 2015-07-18: 4 mg via INTRAVENOUS
  Filled 2015-07-18: qty 2

## 2015-07-18 MED ORDER — NICOTINE 21 MG/24HR TD PT24
21.0000 mg | MEDICATED_PATCH | Freq: Every day | TRANSDERMAL | Status: DC
  Filled 2015-07-18: qty 1

## 2015-07-18 MED ORDER — INSULIN REGULAR BOLUS VIA INFUSION: 7.0000 [IU] | Freq: Once | INTRAVENOUS | Status: DC

## 2015-07-18 MED ORDER — SODIUM CHLORIDE 0.9 % IV SOLN
1000.0000 mL | INTRAVENOUS | Status: DC
  Administered 2015-07-18: 1000 mL via INTRAVENOUS

## 2015-07-18 NOTE — Progress Notes (Signed)
Pt with 2 [facility] ED visits in last 6 months Pt was listed with Dr Armen PickupFunches as pcp but he confirms with ED CM he has not seen Dr Armen PickupFunches since 2015 and will not go to see Dr Armen PickupFunches  CM spoke with pt who [place]cp.  CM discussed and provided written information for uninsured accepting pcps, discussed the importance of pcp vs EDP services for f/u care, www.needymeds.org, www.goodrx.com, discounted pharmacies and other [place] resources such as [facility] , [facility], affordable care act, financial assistance, uninsured dental services, [place] med assist, DSS and  health department  Reviewed resources for [place] uninsured accepting pcps like [facility], [facility] medicine [facility] [address], [facility], [facility], local urgent care centers, [facility], [facility], general medical clinics, [facility] of the [place], [facility] plus others, medication resources, [facility] out patient pharmacies and housing Pt voiced understanding and appreciation of resources provided  Left resources in pt belonging bag on pt bed Pt sleeping when Cm first entered his room and returned to sleep when Cm left his room  Provided [facility] contact information Pt agreed to a referral Cm completed referral Pt to be contact by [facility] clinical liason

## 2015-07-18 NOTE — ED Notes (Signed)
He is awake, alert and states he feels "a lot better".  To [address] [facility] at this time.

## 2015-07-18 NOTE — ED Notes (Signed)
Bed: WA11 Expected date:  Expected time:  Means of arrival:  Comments: Triage 2 

## 2015-07-18 NOTE — ED Provider Notes (Signed)
CSN: [id number]     Arrival date & time 07/18/15  1302 History   First MD Initiated Contact with Patient 07/18/15 1401     Chief Complaint  Patient presents with  . Hyperglycemia  . Emesis     (Consider location/radiation/quality/duration/timing/severity/associated sxs/prior Treatment) HPI Comments: 44yo M w/ PMH including IDDM, depression, polysubstance abuse who presents with vomiting and diarrhea. Patient states that at 2:58 AM, he began having vomiting and nonbloody diarrhea. He also reports generalized abdominal pain and weakness. He states he has been taking his insulin as prescribed. He was started 2 days ago on penicillin for a tooth infection but has not yet been to the dentist. He reports some shortness of breath but denies any chest pain. No fevers. No sick contacts.  Patient is a 44 y.o. male presenting with hyperglycemia and vomiting. The history is provided by the patient.  Hyperglycemia Associated symptoms: vomiting   Emesis   Past Medical History  Diagnosis Date  . Suicidal ideation   . Nausea & vomiting   . Depression 2000  . Diabetes mellitus 1995  . Polysubstance abuse 2012    relapse 2 wk ago    History reviewed. No pertinent past surgical history. Family History  Problem Relation Age of Onset  . Diabetes Mother   . COPD Mother   . Heart disease Mother    Social History  Substance Use Topics  . Smoking status: Current Every Day Smoker -- 1.00 packs/day  . Smokeless tobacco: None  . Alcohol Use: Yes     Comment: 12 pk on w/e    Review of Systems  Gastrointestinal: Positive for vomiting.   10 Systems reviewed and are negative for acute change except as noted in the HPI.    Allergies  Review of patient's allergies indicates no known allergies.  Home Medications   Prior to Admission medications   Medication Sig Start Date End Date Taking? Authorizing Provider  acetaminophen (TYLENOL) 325 MG tablet Take 2 tablets (650 mg total) by mouth every 6  (six) hours as needed for mild pain, moderate pain, fever or headache. 05/18/14  Yes Elease EtienneAnand D Hongalgi, MD  insulin NPH-regular Human (NOVOLIN 70/30) (70-30) 100 UNIT/ML injection Inject 25 Units into the skin 2 (two) times daily with a meal. 06/07/14  Yes Olugbemiga E Hyman HopesJegede, MD  Insulin Syringe-Needle U-100 (SAFETY-GLIDE 0.3CC SYR 29GX1/2) 29G X 1/2" 0.3 ML MISC Use as directed. 05/18/14  Yes Elease EtienneAnand D Hongalgi, MD  oxyCODONE-acetaminophen (PERCOCET) 5-325 MG tablet Take 1-2 tablets by mouth every 6 (six) hours as needed for severe pain. 07/15/15  Yes Danelle BerryLeisa Tapia, PA-C  atorvastatin (LIPITOR) 40 MG tablet Take 1 tablet (40 mg total) by mouth daily. Patient not taking: Reported on 07/18/2015 05/24/14   Dessa PhiJosalyn Funches, MD  ibuprofen (ADVIL,MOTRIN) 800 MG tablet Take 1 tablet (800 mg total) by mouth 3 (three) times daily. Patient not taking: Reported on 07/18/2015 07/15/15   Danelle BerryLeisa Tapia, PA-C  metFORMIN (GLUCOPHAGE XR) 500 MG 24 hr tablet Take 2 tablets (1,000 mg total) by mouth daily with supper. Patient not taking: Reported on 07/18/2015 05/23/14   Dessa PhiJosalyn Funches, MD  omeprazole (PRILOSEC OTC) 20 MG tablet Take 1 tablet (20 mg total) by mouth daily. Patient not taking: Reported on 10/04/2014 05/18/14   Elease EtienneAnand D Hongalgi, MD  penicillin v potassium (VEETID) 500 MG tablet Take 1 tablet (500 mg total) by mouth 4 (four) times daily. 07/15/15 07/22/15  Danelle BerryLeisa Tapia, PA-C  sertraline (ZOLOFT) 50 MG tablet Take 1  tablet (50 mg total) by mouth daily. Patient not taking: Reported on 10/04/2014 05/14/14   Shuvon B Rankin, NP   BP 119/61 mmHg  Pulse 81  Temp(Src) 97.6 F (36.4 C) (Oral)  Resp 20  SpO2 99% Physical Exam  Constitutional: He is oriented to person, place, and time. He appears well-developed and well-nourished.  Eyes closed, uncomfortable  HENT:  Head: Normocephalic and atraumatic.  Dry mucous membranes, poor dentition w/ multiple caries and broken teeth  Eyes: Conjunctivae are normal. Pupils are  equal, round, and reactive to light.  Neck: Neck supple.  Cardiovascular: Normal rate, regular rhythm and normal heart sounds.   No murmur heard. Pulmonary/Chest: Effort normal and breath sounds normal.  Abdominal: Soft. Bowel sounds are normal. He exhibits no distension.  Generalized TTP w/ no rebound or guarding  Musculoskeletal: He exhibits no edema.  Neurological: He is alert and oriented to person, place, and time.  Fluent speech  Skin: Skin is warm and dry.  Psychiatric: He has a normal mood and affect. Judgment normal.  Nursing note and vitals reviewed.   ED Course  .Critical Care Performed by: Laurence Spates Authorized by: Laurence Spates Total critical care time: 30 minutes Critical care time was exclusive of separately billable procedures and treating other patients. Critical care was necessary to treat or prevent imminent or life-threatening deterioration of the following conditions: endocrine crisis. Critical care was time spent personally by me on the following activities: development of treatment plan with patient or surrogate, discussions with consultants, discussions with primary provider, evaluation of patient's response to treatment, examination of patient, obtaining history from patient or surrogate, ordering and performing treatments and interventions, ordering and review of laboratory studies, ordering and review of radiographic studies and re-evaluation of patient's condition.   (including critical care time) Labs Review Labs Reviewed  BASIC METABOLIC PANEL - Abnormal; Notable for the following:    Potassium 5.4 (*)    CO2 15 (*)    Glucose, Bld 416 (*)    BUN 30 (*)    Creatinine, Ser 1.52 (*)    Calcium 8.5 (*)    GFR calc non Af Amer 54 (*)    Anion gap 17 (*)    All other components within normal limits  CBC - Abnormal; Notable for the following:    WBC 13.0 (*)    All other components within normal limits  HEPATIC FUNCTION PANEL -  Abnormal; Notable for the following:    Total Protein 5.6 (*)    Albumin 3.4 (*)    Total Bilirubin 1.9 (*)    Indirect Bilirubin 1.6 (*)    All other components within normal limits  CBG MONITORING, ED - Abnormal; Notable for the following:    Glucose-Capillary 401 (*)    All other components within normal limits  CBG MONITORING, ED - Abnormal; Notable for the following:    Glucose-Capillary 420 (*)    All other components within normal limits  CBG MONITORING, ED - Abnormal; Notable for the following:    Glucose-Capillary 283 (*)    All other components within normal limits  LIPASE, BLOOD  URINALYSIS, ROUTINE W REFLEX MICROSCOPIC (NOT AT [facility])  CBG MONITORING, ED  CBG MONITORING, ED    Imaging Review Dg Chest 2 View  07/18/2015  CLINICAL DATA:  Recently seen with [place] for abscess tooth. Began having N/V/D starting yesterday continuing into today. States rash to abdomin that itches and is recurrent for him. Dry heaving with EMS, vomitus in  trash can on their arrival to sce.*comment was truncated* EXAM: CHEST  2 VIEW COMPARISON:  Acute abdomen series of 05/14/2014. FINDINGS: Midline trachea. Normal heart size and mediastinal contours. No pleural effusion or pneumothorax. Clear lungs. IMPRESSION: No acute cardiopulmonary disease. Electronically Signed   By: Jeronimo Greaves M.D.   On: 07/18/2015 14:47   I have personally reviewed and evaluated these lab results as part of my medical decision-making.   EKG Interpretation   Date/Time:  Wednesday July 18 2015 13:12:13 EST Ventricular Rate:  65 PR Interval:  150 QRS Duration: 103 QT Interval:  417 QTC Calculation: 434 R Axis:   86 Text Interpretation:  Sinus rhythm RSR' in V1 or V2, probably normal  variant Probable left ventricular hypertrophy ST elevation suggests acute  pericarditis Baseline wander in lead(s) I III aVL aVF V3 V4 peaked T waves  in precordial leads suggests hyperkalemia Confirmed by Montoya Brandel MD, Hervey Wedig  [phone]) on  07/18/2015 1:16:58 PM     Medications  0.9 %  sodium chloride infusion (0 mLs Intravenous Stopped 07/18/15 1455)    Followed by  0.9 %  sodium chloride infusion (0 mLs Intravenous Stopped 07/18/15 1551)    Followed by  0.9 %  sodium chloride infusion (1,000 mLs Intravenous New Bag/Given 07/18/15 1552)  insulin regular (NOVOLIN R,HUMULIN R) 250 Units in sodium chloride 0.9 % 250 mL (1 Units/mL) infusion (not administered)  dextrose 5 %-0.45 % sodium chloride infusion (not administered)  ondansetron (ZOFRAN) injection 4 mg (4 mg Intravenous Given 07/18/15 1409)  insulin aspart (novoLOG) injection 7 Units (7 Units Intravenous Given 07/18/15 1455)    MDM   Final diagnoses:  Type 1 diabetes mellitus with ketoacidosis without coma (HCC)  Vomiting Diarrhea Acute kidney injury  Patient with insulin-dependent diabetes who presents with vomiting and diarrhea as well as generalized abdominal pain that began overnight. Patient uncomfortable but in no acute distress at presentation. Vital signs stable. Mild generalized tenderness to palpation of abdomen with no rebound. Obtained above labs and gave the patient an IV fluid bolus and Zofran. EKG showed peaked T waves suggestive of hyperkalemia. Chest x-ray negative acute.  Labs notable for initial glucose of 416, potassium 5.4, bicarbonate 15, creatinine 1.52, anion gap 17, WBC 13,000. Labs consistent with DKA, AKI. Gave the patient 2 more liters of fluid and started on maintenance fluids. Gave 7 units regular insulin bolus. Discussed the patient with [facility] hospitalist and initiated insulin drip. Repeat BMP sent. Patient admitted to general medicine for further care.   Laurence Spates, MD 07/18/15 [phone]

## 2015-07-18 NOTE — ED Notes (Addendum)
Recently seen with us for abscess tooth. Began having N/V/D starting yesterday continuing into today. States rash to abdomin that itches and is recurrent for him. Dry heaving with EMS, vomitus in trash can on their arrival to scene. Pt complaining of SOB, generalized abdominal pain, weakness, and has a CBG of 420 in triage.

## 2015-07-18 NOTE — ED Notes (Signed)
PT CAN GO TO FLOOR AT 1738 

## 2015-07-18 NOTE — ED Notes (Signed)
Note:  Saline IV bolus is running at 97299ml/hour.

## 2015-07-18 NOTE — H&P (Signed)
History and Physical  John Cox NGE:[id number] DOB: 1971/04/09 DOA: 07/18/2015  Referring physician: Dr. Frederick Peers, EDP PCP: No primary care provider on file.  Outpatient Specialists:  1. None  Chief Complaint: Intractable nausea, vomiting and some diarrhea.  HPI: DAMETRI OZBURN is a 44 y.o. male , single, lives with his mother, independent, history of type II DM diagnosed at age 1, with peripheral neuropathy, IDDM, HLD, depression, tobacco and marijuana abuse, dental caries, [facility] ED on 07/18/15 with complaints of intractable nausea, vomiting and some diarrhea. He was seen in ED here on 07/15/15 and started on oral penicillin for dental caries/pain. Since yesterday afternoon, he started having several episodes of nausea and vomiting-states that he has had up to 30 episodes. Initially where food and drinks that he had consumed and at times he saw some blood discolored emesis but no frank coffee-ground material or clots. He had 2 episodes of soft stools this morning but did not notice color of stools for melena or blood. He complains of diffuse abdominal pain-moderate in intensity without radiation. He denies eating out or eating anything unusual. No sickly contacts. No fever or chills. Dental pain actually has significantly improved. He has not taken his insulin since 11/14 a.m. He does not have a PCP and states that he goes to [place] and they fill up his prescriptions. In the ED, he was found to be in mild DKA and was bolused with IV normal saline. CBGs have improved from 420 mg to 259. Insulin drip per DKA protocol has been initiated. He was also mildly hyperkalemic with potassium of 5.4 and acute kidney injury with creatinine of 1.52. Hospitalist admission was requested. Patient states that he has not vomited for a couple of hours and has tolerated 2 glasses of water. He is feeling much better.   Review of Systems: All systems reviewed and apart from  history of presenting illness, are negative.  Past Medical History  Diagnosis Date  . Suicidal ideation   . Nausea & vomiting   . Depression 2000  . Diabetes mellitus 1995  . Polysubstance abuse 2012    relapse 2 wk ago    History reviewed. No pertinent past surgical history. Social History:  reports that he has been smoking.  He does not have any smokeless tobacco history on file. He reports that he drinks alcohol. He reports that he uses illicit drugs (Marijuana) about 3 times per week. Patient states that he smokes up to a pack of cigarettes daily. Periodically uses marijuana. States that he drinks up to 3 times per week, maybe 1-2 beers each time. Denies hard liquor. Denies other drug abuse.  No Known Allergies  Family History  Problem Relation Age of Onset  . Diabetes Mother   . COPD Mother   . Heart disease Mother     Prior to Admission medications   Medication Sig Start Date End Date Taking? Authorizing Provider  acetaminophen (TYLENOL) 325 MG tablet Take 2 tablets (650 mg total) by mouth every 6 (six) hours as needed for mild pain, moderate pain, fever or headache. 05/18/14  Yes Elease Etienne, MD  insulin NPH-regular Human (NOVOLIN 70/30) (70-30) 100 UNIT/ML injection Inject 25 Units into the skin 2 (two) times daily with a meal. 06/07/14  Yes Olugbemiga E Hyman Hopes, MD  Insulin Syringe-Needle U-100 (SAFETY-GLIDE 0.3CC SYR 29GX1/2) 29G X 1/2" 0.3 ML MISC Use as directed. 05/18/14  Yes Elease Etienne, MD  oxyCODONE-acetaminophen (PERCOCET)  5-325 MG tablet Take 1-2 tablets by mouth every 6 (six) hours as needed for severe pain. 07/15/15  Yes Danelle Berry, PA-C  atorvastatin (LIPITOR) 40 MG tablet Take 1 tablet (40 mg total) by mouth daily. Patient not taking: Reported on 07/18/2015 05/24/14   Dessa Phi, MD  penicillin v potassium (VEETID) 500 MG tablet Take 1 tablet (500 mg total) by mouth 4 (four) times daily. 07/15/15 07/22/15  Danelle Berry, PA-C   Physical Exam: Filed  Vitals:   07/18/15 1410 07/18/15 1455 07/18/15 1556 07/18/15 1631  BP: 121/50 113/58 123/54 119/61  Pulse: 69 70 77 81  Temp:      TempSrc:      Resp: SpO2: 97% 95% 99% 99%   temperature 97.35F.   General exam: Moderately built and nourished patient, lying comfortably supine on the gurney in no obvious distress. Does not look septic or toxic.  Head, eyes and ENT: Nontraumatic and normocephalic. Pupils equally reacting to light and accommodation. Oral mucosa dry. Several missing teeth. Patient at least has 3 teeth with advanced caries but no other acute findings.  Neck: Supple. No JVD, carotid bruit or thyromegaly.  Lymphatics: No lymphadenopathy.  Respiratory system: Clear to auscultation. No increased work of breathing.  Cardiovascular system: S1 and S2 heard, RRR. No JVD, murmurs, gallops, clicks or pedal edema.  Gastrointestinal system: Abdomen is nondistended, soft and nontender. Normal bowel sounds heard. No organomegaly or masses appreciated.  Central nervous system: Alert and oriented. No focal neurological deficits.  Extremities: Symmetric 5 x 5 power. Peripheral pulses symmetrically felt.   Skin: No rashes or acute findings.  Musculoskeletal system: Negative exam.  Psychiatry: Pleasant and cooperative.   Labs on Admission:  Basic Metabolic Panel:  Recent Labs Lab 07/18/15 1353  NA 137  K 5.4*  CL 105  CO2 15*  GLUCOSE 416*  BUN 30*  CREATININE 1.52*  CALCIUM 8.5*   Liver Function Tests:  Recent Labs Lab 07/18/15 1404  AST 35  ALT 35  ALKPHOS 55  BILITOT 1.9*  PROT 5.6*  ALBUMIN 3.4*    Recent Labs Lab 07/18/15 1404  LIPASE 19   No results for input(s): AMMONIA in the last 168 hours. CBC:  Recent Labs Lab 07/18/15 1353  WBC 13.0*  HGB 15.2  HCT 47.1  MCV 86.7  PLT 284   Cardiac Enzymes: No results for input(s): CKTOTAL, CKMB, CKMBINDEX, TROPONINI in the last 168 hours.  BNP (last 3 results) No results for  input(s): PROBNP in the last 8760 hours. CBG:  Recent Labs Lab 07/18/15 1308 07/18/15 1330 07/18/15 1549 07/18/15 1732  GLUCAP 420* 401* 283* 259*    Radiological Exams on Admission: Dg Chest 2 View  07/18/2015  CLINICAL DATA:  Recently seen with [place] for abscess tooth. Began having N/V/D starting yesterday continuing into today. States rash to abdomin that itches and is recurrent for him. Dry heaving with EMS, vomitus in trash can on their arrival to sce.*comment was truncated* EXAM: CHEST  2 VIEW COMPARISON:  Acute abdomen series of 05/14/2014. FINDINGS: Midline trachea. Normal heart size and mediastinal contours. No pleural effusion or pneumothorax. Clear lungs. IMPRESSION: No acute cardiopulmonary disease. Electronically Signed   By: Jeronimo Greaves M.D.   On: 07/18/2015 14:47    EKG: Independently reviewed. Sinus rhythm at 65 bpm, normal axis, RSR in V1-2 probably a normal variant and not new, possible LVH, peaked T waves in leads V2-5 and QTC 434 ms.  Assessment/Plan Principal Problem:  DKA, type 2, not at goal [facility](HCC) Active Problems:   TOBACCO ABUSE   Major depression, recurrent (HCC)   Hyperlipidemia associated with type 2 diabetes mellitus (HCC)   Hyperkalemia   AKI (acute kidney injury) (HCC)   Nausea vomiting and diarrhea   Marijuana abuse   Dehydration   Dental caries    DKA, type 2, not at goal - May have been precipitated by intractable nausea, vomiting and dehydration - Treat per DKA protocol with aggressive IV fluid hydration, insulin drip and close monitoring of BMP. - Once DKA resolved, transition to home dose of 70/30 insulin. - Last A1c 05/16/14:10.3. We'll repeat A1c.  Intractable nausea, vomiting and some diarrhea - DD: Acute viral GE versus diabetic gastroparesis versus secondary to DKA - Patient may have had some blood in emesis but does not seem like a significant upper GI bleed. This may have been from Mallory-Weiss tear versus gastritis. - Supportive  treatment with bowel rest, IV fluids, IV PPI and antiemetics - If patient has worsened upper GI bleed symptoms, consider GI consultation. If not, gradually advance diet as tolerated  Dehydration - IV fluids  Acute kidney injury - Secondary to dehydration. IV fluids and follow BMP  Mild hyperkalemia - Secondary to acute kidney injury. Expected to improve with IV fluid hydration. BMP now. Will repeat EKG as well (had some changes on initial EKG)  Tobacco abuse - Cessation counseled. Nicotine patch per patient's request.  Marijuana abuse - Cessation counseled.  Dental caries - Continue oral penicillin. Counseled that he will need to see a dentist at some point for multiple teeth extractions.  Hyperlipidemia - Has not taken medications for a while  Major depression - No suicidal or homicidal ideations reported    DVT prophylaxis: SCDs Code Status: Full  Family Communication: None at bedside  Disposition Plan: DC home possibly in 48 hours.   Time spent: 60 minutes  Hazelle Woollard, MD, FACP, FHM. [facility] Pager [phone]  If 7PM-7AM, please contact night-coverage [email or web address] Password [email or web address] 07/18/2015, 5:38 PM

## 2015-07-19 DIAGNOSIS — E440 Moderate protein-calorie malnutrition: Secondary | ICD-10-CM | POA: Insufficient documentation

## 2015-07-19 DIAGNOSIS — E1169 Type 2 diabetes mellitus with other specified complication: Secondary | ICD-10-CM

## 2015-07-19 DIAGNOSIS — E785 Hyperlipidemia, unspecified: Secondary | ICD-10-CM

## 2015-07-19 LAB — BASIC METABOLIC PANEL
ANION GAP: 6 (ref 5–15)
Anion gap: 6 (ref 5–15)
BUN: 26 mg/dL — ABNORMAL HIGH (ref 6–20)
BUN: 25 mg/dL — ABNORMAL HIGH (ref 6–20)
CALCIUM: 7.9 mg/dL — AB (ref 8.9–10.3)
CO2: 24 mmol/L (ref 22–32)
CO2: 24 mmol/L (ref 22–32)
Calcium: 8.1 mg/dL — ABNORMAL LOW (ref 8.9–10.3)
Chloride: 110 mmol/L (ref 101–111)
Chloride: 110 mmol/L (ref 101–111)
Creatinine, Ser: 1.25 mg/dL — ABNORMAL HIGH (ref 0.61–1.24)
Creatinine, Ser: 1.28 mg/dL — ABNORMAL HIGH (ref 0.61–1.24)
GFR calc Af Amer: >60 mL/min (ref >60)
GFR calc Af Amer: >60 mL/min (ref >60)
GFR calc non Af Amer: >60 mL/min (ref >60)
Glucose, Bld: 109 mg/dL — ABNORMAL HIGH (ref 65–99)
Glucose, Bld: 74 mg/dL (ref 65–99)
POTASSIUM: 4 mmol/L (ref 3.5–5.1)
Potassium: 3.5 mmol/L (ref 3.5–5.1)
SODIUM: 140 mmol/L (ref 135–145)
Sodium: 140 mmol/L (ref 135–145)

## 2015-07-19 LAB — CBC
HCT: 38.8 % — ABNORMAL LOW (ref 39.0–52.0)
Hemoglobin: 12.8 g/dL — ABNORMAL LOW (ref 13.0–17.0)
MCH: 28.3 pg (ref 26.0–34.0)
MCHC: 33 g/dL (ref 30.0–36.0)
MCV: 85.8 fL (ref 78.0–100.0)
Platelets: 272 10*3/uL (ref 150–400)
RBC: 4.52 MIL/uL (ref 4.22–5.81)
RDW: 13.6 % (ref 11.5–15.5)
WBC: 10.3 10*3/uL (ref 4.0–10.5)

## 2015-07-19 LAB — GLUCOSE, CAPILLARY
GLUCOSE-CAPILLARY: 70 mg/dL (ref 65–99)
GLUCOSE-CAPILLARY: 126 mg/dL — AB (ref 65–99)
GLUCOSE-CAPILLARY: 98 mg/dL (ref 65–99)
Glucose-Capillary: 111 mg/dL — ABNORMAL HIGH (ref 65–99)
Glucose-Capillary: 196 mg/dL — ABNORMAL HIGH (ref 65–99)
Glucose-Capillary: 91 mg/dL (ref 65–99)

## 2015-07-19 MED ORDER — INSULIN NPH ISOPHANE & REGULAR (70-30) 100 UNIT/ML ~~LOC~~ SUSP: 25.0000 [IU] | Freq: Two times a day (BID) | SUBCUTANEOUS | Status: DC

## 2015-07-19 MED ORDER — SODIUM CHLORIDE 0.9 % IV SOLN
INTRAVENOUS | Status: DC
  Administered 2015-07-19: 06:00:00 via INTRAVENOUS

## 2015-07-19 MED ORDER — OMEPRAZOLE 20 MG PO CPDR: 20.0000 mg | DELAYED_RELEASE_CAPSULE | Freq: Two times a day (BID) | ORAL | Status: DC

## 2015-07-19 MED ORDER — ACETAMINOPHEN 325 MG PO TABS: 650.0000 mg | ORAL_TABLET | Freq: Four times a day (QID) | ORAL | Status: DC | PRN

## 2015-07-19 MED ORDER — ATORVASTATIN CALCIUM 40 MG PO TABS: 40.0000 mg | ORAL_TABLET | Freq: Every day | ORAL | Status: DC

## 2015-07-19 MED ORDER — INSULIN ASPART 100 UNIT/ML ~~LOC~~ SOLN
0.0000 [IU] | Freq: Three times a day (TID) | SUBCUTANEOUS | Status: DC
Start: 2015-07-19 — End: 2015-07-19
  Administered 2015-07-19: 3 [IU] via SUBCUTANEOUS

## 2015-07-19 MED ORDER — INSULIN ASPART PROT & ASPART (70-30 MIX) 100 UNIT/ML ~~LOC~~ SUSP
25.0000 [IU] | Freq: Two times a day (BID) | SUBCUTANEOUS | Status: DC
  Administered 2015-07-19: 25 [IU] via SUBCUTANEOUS

## 2015-07-19 NOTE — Progress Notes (Signed)
Pt asked for a list dentists in the area that would take uninsured patients.  Explained to pt that there is a charge with all dentists there is no free clinics.  List given to pt. Also offered MATCH program to pt. Pt states that he will be able to purchase his medication. [facility] was closed for lunch until 2PM and after calling, no appointments, [facility] called for an appointment.

## 2015-07-19 NOTE — Discharge Instructions (Signed)
Diabetic Ketoacidosis °Diabetic ketoacidosis is a life-threatening complication of diabetes. If it is not treated, it can cause severe dehydration and organ damage and can lead to a coma or death. °CAUSES °This condition develops when there is not enough of the hormone insulin in the body. Insulin helps the body to break down sugar for energy. Without insulin, the body cannot break down sugar, so it breaks down fats instead. This leads to the production of acids that are called ketones. Ketones are poisonous at high levels. °This condition can be triggered by: °· Stress on the body that is brought on by an illness. °· Medicines that raise blood glucose levels. °· Not taking diabetes medicine. °SYMPTOMS °Symptoms of this condition include: °· Fatigue. °· Weight loss. °· Excessive thirst. °· Light-headedness. °· Fruity or sweet-smelling breath. °· Excessive urination. °· Vision changes. °· Confusion or irritability. °· Nausea. °· Vomiting. °· Rapid breathing. °· Abdominal pain. °· Feeling flushed. °DIAGNOSIS °This condition is diagnosed based on a medical history, a physical exam, and blood tests. You may also have a urine test that checks for ketones. °TREATMENT °This condition may be treated with: °· Fluid replacement. This may be done to correct dehydration. °· Insulin injections. These may be given through the skin or through an IV tube. °· Electrolyte replacement. Electrolytes, such as potassium and sodium, may be given in pill form or through an IV tube. °· Antibiotic medicines. These may be prescribed if your condition was caused by an infection. °HOME CARE INSTRUCTIONS °Eating and Drinking °· Drink enough fluids to keep your urine clear or pale yellow. °· If you cannot eat, alternate between drinking fluids with sugar (such as juice) and salty fluids (such as broth or bouillon). °· If you can eat, follow your usual diet and drink sugar-free liquids, such as water. °Other Instructions °· Take insulin as  directed by your health care provider. Do not skip insulin injections. Do not use expired insulin. °· If your blood sugar is over 240 mg/dL, monitor your urine ketones every 4-6 hours. °· If you were prescribed an antibiotic medicine, finish all of it even if you start to feel better. °· Rest and exercise only as directed by your health care provider. °· If you get sick, call your health care provider and begin treatment quickly. Your body often needs extra insulin to fight an illness. °· Check your blood glucose levels regularly. If your blood glucose is high, drink plenty of fluids. This helps to flush out ketones. °SEEK MEDICAL CARE IF: °· Your blood glucose level is too high or too low. °· You have ketones in your urine. °· You have a fever. °· You cannot eat. °· You cannot tolerate fluids. °· You have been vomiting for more than 2 hours. °· You continue to have symptoms of this condition. °· You develop new symptoms. °SEEK IMMEDIATE MEDICAL CARE IF: °· Your blood glucose levels continue to be high (elevated). °· Your monitor reads "high" even when you are taking insulin. °· You faint. °· You have chest pain. °· You have trouble breathing. °· You have a sudden, severe headache. °· You have sudden weakness in one arm or one leg. °· You have sudden trouble speaking or swallowing. °· You have vomiting or diarrhea that gets worse after 3 hours. °· You feel severely fatigued. °· You have trouble thinking. °· You have abdominal pain. °· You are severely dehydrated. Symptoms of severe dehydration include: °¨ Extreme thirst. °¨ Dry mouth. °¨ Blue lips. °¨   Cold hands and feet.  Rapid breathing.   This information is not intended to replace advice given to you by your health care provider. Make sure you discuss any questions you have with your health care provider.   Document Released: 08/15/2000 Document Revised: 01/02/2015 Document Reviewed: 07/26/2014 Elsevier Interactive Patient Education 2016 [employer]  Viral Gastroenteritis Viral gastroenteritis is also known as stomach flu. This condition affects the stomach and intestinal tract. It can cause sudden diarrhea and vomiting. The illness typically lasts 3 to 8 days. Most people develop an immune response that eventually gets rid of the virus. While this natural response develops, the virus can make you quite ill. CAUSES  Many different viruses can cause gastroenteritis, such as rotavirus or noroviruses. You can catch one of these viruses by consuming contaminated food or water. You may also catch a virus by sharing utensils or other personal items with an infected person or by touching a contaminated surface. SYMPTOMS  The most common symptoms are diarrhea and vomiting. These problems can cause a severe loss of body fluids (dehydration) and a body salt (electrolyte) imbalance. Other symptoms may include:  Fever.  Headache.  Fatigue.  Abdominal pain. DIAGNOSIS  Your caregiver can usually diagnose viral gastroenteritis based on your symptoms and a physical exam. A stool sample may also be taken to test for the presence of viruses or other infections. TREATMENT  This illness typically goes away on its own. Treatments are aimed at rehydration. The most serious cases of viral gastroenteritis involve vomiting so severely that you are not able to keep fluids down. In these cases, fluids must be given through an intravenous line (IV). HOME CARE INSTRUCTIONS   Drink enough fluids to keep your urine clear or pale yellow. Drink small amounts of fluids frequently and increase the amounts as tolerated.  Ask your caregiver for specific rehydration instructions.  Avoid:  Foods high in sugar.  Alcohol.  Carbonated drinks.  Tobacco.  Juice.  Caffeine drinks.  Extremely hot or cold fluids.  Fatty, greasy foods.  Too much intake of anything at one time.  Dairy products until 24 to 48 hours after diarrhea stops.  You may consume  probiotics. Probiotics are active cultures of beneficial bacteria. They may lessen the amount and number of diarrheal stools in adults. Probiotics can be found in yogurt with active cultures and in supplements.  Wash your hands well to avoid spreading the virus.  Only take over-the-counter or prescription medicines for pain, discomfort, or fever as directed by your caregiver. Do not give aspirin to children. Antidiarrheal medicines are not recommended.  Ask your caregiver if you should continue to take your regular prescribed and over-the-counter medicines.  Keep all follow-up appointments as directed by your caregiver. SEEK IMMEDIATE MEDICAL CARE IF:   You are unable to keep fluids down.  You do not urinate at least once every 6 to 8 hours.  You develop shortness of breath.  You notice blood in your stool or vomit. This may look like coffee grounds.  You have abdominal pain that increases or is concentrated in one small area (localized).  You have persistent vomiting or diarrhea.  You have a fever.  The patient is a child younger than 3 months, and he or she has a fever.  The patient is a child older than 3 months, and he or she has a fever and persistent symptoms.  The patient is a child older than 3 months, and he or she has a  fever and symptoms suddenly get worse.  The patient is a baby, and he or she has no tears when crying. MAKE SURE YOU:   Understand these instructions.  Will watch your condition.  Will get help right away if you are not doing well or get worse.   This information is not intended to replace advice given to you by your health care provider. Make sure you discuss any questions you have with your health care provider.   Document Released: 08/18/2005 Document Revised: 11/10/2011 Document Reviewed: 06/04/2011 Elsevier Interactive Patient Education 2016 [employer]  Pain Medicine Instructions  HOW CAN PAIN MEDICINE AFFECT ME?    You were given a  prescription for pain medicine. This medicine may make you tired or drowsy and may affect your ability to think clearly. Pain medicine may also affect your ability to drive or perform certain physical activities. It may not be possible to make all of your pain go away, but you should be comfortable enough to move, breathe, and take care of yourself.  HOW OFTEN SHOULD I TAKE PAIN MEDICINE AND HOW MUCH SHOULD I TAKE?  Take pain medicine only as directed by your health care provider and only as needed for pain.  You do not need to take pain medicine if you are not having pain, unless directed by your health care provider.  You can take less than the prescribed dose if you find that a smaller amount of medicine controls your pain. WHAT RESTRICTIONS DO I HAVE WHILE TAKING PAIN MEDICINE?  Follow these instructions after you start taking pain medicine, while you are taking the medicine, and for 8 hours after you stop taking the medicine:  Do not drive.  Do not operate machinery.  Do not operate power tools.  Do not sign legal documents.  Do not drink alcohol.  Do not take sleeping pills.  Do not supervise children by yourself.  Do not participate in activities that require climbing or being in high places.  Do not enter a body of water--such as a lake, river, ocean, spa, or swimming pool--without an adult nearby who can monitor and help you. HOW CAN I KEEP OTHERS SAFE WHILE I AM TAKING PAIN MEDICINE?  Store your pain medicine as directed by your health care provider. Make sure that it is placed where children and pets cannot reach it.  Never share your pain medicine with anyone.  Do not save any leftover pills. If you have any leftover pain medicine, get rid of it or destroy it as directed by your health care provider. WHAT ELSE DO I NEED TO KNOW ABOUT TAKING PAIN MEDICINE?  Use a stool softener if you become constipated from your pain medicine. Increasing your intake of fruits and vegetables will also  help with constipation.  Write down the times when you take your pain medicine. Look at the times before you take your next dose of medicine. It is easy to become confused while on pain medicine. Recording the times helps you to avoid an overdose.  If your pain is severe, do not try to treat it yourself by taking more pills than instructed on your prescription. Contact your health care provider for help.  You may have been prescribed a pain medicine that contains acetaminophen. Do not take any other acetaminophen while taking this medicine. An overdose of acetaminophen can result in severe liver damage. Acetaminophen is found in many over-the-counter (OTC) and prescription medicines. If you are taking any medicines in addition to your  pain medicine, check the active ingredients on those medicines to see if acetaminophen is listed. WHEN SHOULD I CALL MY HEALTH CARE PROVIDER?  Your medicine is not helping to make the pain go away.  You vomit or have diarrhea shortly after taking the medicine.  You develop new pain in areas that did not hurt before.  You have an allergic reaction to your medicine. This may include:  Itchiness.  Swelling.  Dizziness.  Developing a new rash. WHEN SHOULD I CALL 911 OR GO TO THE EMERGENCY ROOM?  You feel dizzy or you faint.  You are very confused or disoriented.  You repeatedly vomit.  Your skin or lips turn pale or bluish in color.  You have shortness of breath or you are breathing much more slowly than usual.  You have a severe allergic reaction to your medicine. This includes:  Developing tongue swelling.  Having difficulty breathing. This information is not intended to replace advice given to you by your health care provider. Make sure you discuss any questions you have with your health care provider.  Document Released: 11/24/2000 Document Revised: 01/02/2015 Document Reviewed: 06/22/2014  Elsevier Interactive Patient Education [employer]

## 2015-07-19 NOTE — Progress Notes (Signed)
Initial Nutrition Assessment  DOCUMENTATION CODES:   Non-severe (moderate) malnutrition in context of acute illness/injury  INTERVENTION:  - Continue Carb Modified diet  - RD will continue to monitor for needs  NUTRITION DIAGNOSIS:   Inadequate oral intake related to acute illness, nausea, vomiting as evidenced by per patient/family report.  GOAL:   Patient will meet greater than or equal to 90% of their needs  MONITOR:   PO intake, Weight trends, Labs, I & O's  REASON FOR ASSESSMENT:   Malnutrition Screening Tool  ASSESSMENT:   44 y.o. male , single, lives with his mother, independent, history of type II DM diagnosed at age 44, with peripheral neuropathy, IDDM, HLD, depression, tobacco and marijuana abuse, dental caries, [facility]18[facility]15 with complaints of intractable nausea, vomiting and some diarrhea. He was seen in ED here on 07/15/15 and started on oral penicillin for dental caries/pain. Since yesterday afternoon, he started having several episodes of nausea and vomiting-states that he has had up to 30 episodes. Initially where food and drinks that he had consumed and at times he saw some blood discolored emesis but no frank coffee-ground material or clots. He had 2 episodes of soft stools this morning but did not notice color of stools for melena or blood. He complains of diffuse abdominal pain-moderate in intensity without radiation. He denies eating out or eating anything unusual. No sickly contacts. No fever or chills. Dental pain actually has significantly improved. He has not taken his insulin since 11/14 a.m.  Pt seen for MST. BMI indicates normal weight status. Per chart, pt consumed 75% of breakfast which pt reports was cereal and bacon and that he did not eat much of his eggs. He states that for the past 2 weeks he has been experiencing ongoing tooth pain related to tooth infection and that this has affected what he is able to eat. He  also states that starting 07/17/15 he was having nausea with vomiting and abdominal pain. No discomfort following breakfast this AM and he states last episode of emesis was yesterday evening.  Pt states his UBW is 169 lbs and that he knows for certain that he last weighed this 2-3 weeks ago. This would indicate 11 lb weight loss (6.5% body weight) in the past 2-3 weeks which is significant for time frame. No muscle or fat wasting noted at this time.   Pt not meeting needs PTA. Expect him to do better with improvement in N/V and abdominal pain and access to softer foods to decrease pain with chewing. Medications reviewed. Labs reviewed;  CBGs: 170-182 mg/dL, BUN/creatinine elevated, Ca: 8.1 mg/dL.   Diet Order:  Diet Carb Modified Fluid consistency:: Thin; Room service appropriate?: Yes  Skin:  Reviewed, no issues  Last BM:  11/16  Height:   Ht Readings from Last 1 Encounters:  07/18/15 5\' 11"  (1.803 m)    Weight:   Wt Readings from Last 1 Encounters:  07/19/15 158 lb 6.4 oz (71.85 kg)    Ideal Body Weight:  75.45 kg (kg)  BMI:  Body mass index is 22.1 kg/(m^2).  Estimated Nutritional Needs:   Kcal:  1800-2000  Protein:  65-75 grams  Fluid:  2.2-2.5 L/day  EDUCATION NEEDS:   No education needs identified at this time     Trenton GammonJessica Darrold Bezek, RD, LDN Inpatient Clinical Dietitian Pager # [phone] After hours/weekend pager # [phone]

## 2015-07-19 NOTE — Discharge Summary (Addendum)
Physician Discharge Summary  John Cox WUJ:[id number] DOB: 10/02/1970 DOA: 07/18/2015  PCP: No primary care provider on file.  Admit date: 07/18/2015 Discharge date: 07/19/2015  Time spent: Less than 30 minutes  Recommendations for Outpatient Follow-up:  1. PCP in 1 week with repeat labs (CBC & BMP). Case management as consulted to assist patient with finding new PCP and possible medication needs. Please follow A1c that was sent from the hospital.  Discharge Diagnoses:  Principal Problem:   DKA, type 2, not at goal [facility]) Active Problems:   TOBACCO ABUSE   Major depression, recurrent (HCC)   Hyperlipidemia associated with type 2 diabetes mellitus (HCC)   Hyperkalemia   AKI (acute kidney injury) (HCC)   Nausea vomiting and diarrhea   Marijuana abuse   Dehydration   Dental caries   Malnutrition of moderate degree   Discharge Condition: Improved & Stable  Diet recommendation: Heart healthy and diabetic diet.  Filed Weights   07/18/15 1700 07/19/15 0418  Weight: 71.759 kg (158 lb 3.2 oz) 71.85 kg (158 lb 6.4 oz)    History of present illness:  44 y.o. male , single, lives with his mother, independent, history of type II DM diagnosed at age 69, with peripheral neuropathy, IDDM, HLD, depression, tobacco and marijuana abuse, dental caries, presented to the [facility] ED on 07/18/15 with complaints of intractable nausea, vomiting and some diarrhea. He was seen in ED here on 07/15/15 and started on oral penicillin for dental caries/pain. Since day prior to admission, he started having several episodes of nausea, vomiting which initially was food and drinks that he had consumed but later on noticed some blood-tinged emesis without clots or coffee-ground. He had 2 episodes of loose stools on the morning of admission. He complained of diffuse abdominal pain. He denied eating out or eating anything unusual. He denied sick contacts with similar symptoms. No fevers or chills.  Tooth pain had actually improved. He had not taken insulin for 2 days prior to admission. In the ED, patient was found to be in mild DKA, blood sugar of 420, acute kidney injury and potassium of 5.4. He was admitted for further evaluation and management.  Hospital Course:   DKA, type 2, not at goal - May have been precipitated by intractable nausea, vomiting and dehydration - Treat per DKA protocol with aggressive IV fluid hydration, insulin drip and close monitoring of BMP. - Once DKA resolved, transition to home dose of 70/30 insulin. - Last A1c 05/16/14:10.3. We will repeat A1c-to be followed as outpatient. It is expected to be high. - DKA resolved overnight and patient was transitioned to home dose of 70/30 insulin which he states that he buys from [employer].  Intractable nausea, vomiting and some diarrhea - DD: Acute viral GE versus diabetic gastroparesis versus secondary to DKA - Patient may have had some blood in emesis but does not seem like a significant upper GI bleed. This may have been from Mallory-Weiss tear versus gastritis. - Supportive treatment with bowel rest, IV fluids, IV PPI and antiemetics - All symptoms resolved. Tolerating diet. Has not had any emesis or BM since admission. Abdomen mildly sore but improving. - He will be discharged on twice a day PPI for a month. He is advised that if he has recurrence of emesis of blood or coffee-ground, he will need to return to the hospital for further evaluation which will include GI consultation and EGD. Tobacco cessation counseled.  Dehydration - IV fluids and resolved  Acute kidney injury - Secondary to dehydration.  Admitted with creatinine of 1.52. Creatinine has improved and plateaued in the 1.2 range. 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 He was counseled regarding avoiding nephrotoxic medications such as NSAIDs and good diabetes control to prevent progression. He verbalized understanding.  Mild hyperkalemia -  Secondary to acute kidney injury. Resolved. Initial EKGs had suggested peaked T waves. Follow-up EKG shows LVH.  Tobacco abuse - Cessation counseled.   Marijuana abuse - Cessation counseled.  Dental caries - Continue oral penicillin. Counseled that he will need to see a dentist at some point soon for multiple teeth extractions. Patient expressed financial difficulties.  Hyperlipidemia - Has not taken medications for a while. Resume statins at DC.  Major depression - No suicidal or homicidal ideations reported. Not on medications prior to admission.  Non-severe (moderate) malnutrition in context of acute illness/injury - Management per dietitian consultation.   Consultations:  None  Procedures:  None    Discharge Exam:  Complaints:  Feels much better. No vomiting or BM since admission. Tolerating diet. Abdomen mildly sore from vomiting that he had prior to admission but improving.  Filed Vitals:   07/18/15 1700 07/18/15 1814 07/18/15 2117 07/19/15 0418  BP: 112/52  139/50 102/47  Pulse: 82  81 68  Temp: 98.6 F (37 C)  99 F (37.2 C) 98.1 F (36.7 C)  TempSrc: Oral  Oral Oral  Resp: 18  18 18   Height: 5\' 10"  (1.778 m) 5\' 11"  (1.803 m)    Weight: 71.759 kg (158 lb 3.2 oz)   71.85 kg (158 lb 6.4 oz)  SpO2: 98%  98% 98%    General exam: Pleasant young male lying comfortably supine in bed. Looks much compared to visit in ED on admission. Respiratory system: Clear. No increased work of breathing. Cardiovascular system: S1 & S2 heard, RRR. No JVD, murmurs, gallops, clicks or pedal edema. Telemetry: Sinus rhythm. Gastrointestinal system: Abdomen is nondistended, soft and nontender. Normal bowel sounds heard. Central nervous system: Alert and oriented. No focal neurological deficits. Extremities: Symmetric 5 x 5 power.  Discharge Instructions      Discharge Instructions    Call MD for:  difficulty breathing, headache or visual disturbances    Complete by:  As  directed      Call MD for:  extreme fatigue    Complete by:  As directed      Call MD for:  persistant dizziness or light-headedness    Complete by:  As directed      Call MD for:  persistant nausea and vomiting    Complete by:  As directed      Call MD for:  severe uncontrolled pain    Complete by:  As directed      Call MD for:    Complete by:  As directed   Vomiting blood or black material or having black stools.     Diet - low sodium heart healthy    Complete by:  As directed      Diet Carb Modified    Complete by:  As directed      Increase activity slowly    Complete by:  As directed             Medication List    TAKE these medications        acetaminophen 325 MG tablet  Commonly known as:  TYLENOL  Take 2 tablets (650 mg total) by mouth every 6 (six) hours  as needed for mild pain, moderate pain, fever or headache. Total Tylenol/acetaminophen dose from all sources not to exceed 4 g per day.     atorvastatin 40 MG tablet  Commonly known as:  LIPITOR  Take 1 tablet (40 mg total) by mouth daily.     insulin NPH-regular Human (70-30) 100 UNIT/ML injection  Commonly known as:  NOVOLIN 70/30  Inject 25 Units into the skin 2 (two) times daily with a meal.     Insulin Syringe-Needle U-100 29G X 1/2" 0.3 ML Misc  Commonly known as:  SAFETY-GLIDE 0.3CC SYR 29GX1/2  Use as directed.     omeprazole 20 MG capsule  Commonly known as:  PRILOSEC  Take 1 capsule (20 mg total) by mouth 2 (two) times daily before a meal.     oxyCODONE-acetaminophen 5-325 MG tablet  Commonly known as:  PERCOCET  Take 1-2 tablets by mouth every 6 (six) hours as needed for severe pain.     penicillin v potassium 500 MG tablet  Commonly known as:  VEETID  Take 1 tablet (500 mg total) by mouth 4 (four) times daily.       Follow-up Information    Follow up with Please use the resources provided to you in emergency room by case manager to assist with doctor for follow up .   Why:  A referral  for you has been sent to [facility] if you have not received a call in 3 days you may contact them Call Scherry Ran at [phone] Tuesday-Friday [email or web address]   Contact information:   These [place] uninsured resources provide possible primary care providers, resources for discounted medications, housing, dental resources, affordable care act information, plus other resources for [employer]        Follow up with [facility] optician. Schedule an appointment as soon as possible for a visit in 1 week.   Why:  To be seen with repeat labs (CBC & BMP).       The results of significant diagnostics from this hospitalization (including imaging, microbiology, ancillary and laboratory) are listed below for reference.    Significant Diagnostic Studies: Dg Chest 2 View  07/18/2015  CLINICAL DATA:  Recently seen with [place] for abscess tooth. Began having N/V/D starting yesterday continuing into today. States rash to abdomin that itches and is recurrent for him. Dry heaving with EMS, vomitus in trash can on their arrival to sce.*comment was truncated* EXAM: CHEST  2 VIEW COMPARISON:  Acute abdomen series of 05/14/2014. FINDINGS: Midline trachea. Normal heart size and mediastinal contours. No pleural effusion or pneumothorax. Clear lungs. IMPRESSION: No acute cardiopulmonary disease. Electronically Signed   By: Jeronimo Greaves M.D.   On: 07/18/2015 14:47    Microbiology: No results found for this or any previous visit (from the past 240 hour(s)).   Labs: Basic Metabolic Panel:  Recent Labs Lab 07/18/15 1353 07/18/15 1725 07/18/15 2100 07/19/15 0040 07/19/15 0501  NA 137 140 141 140 140  K 5.4* 5.0 4.4 4.0 3.5  CL 105 110 111 110 110  CO2 15* 16* GLUCOSE 416* 285* 179* 109* 74  BUN 30* 30* 28* 26* 25*  CREATININE 1.52* 1.38* 1.29* 1.25* 1.28*  CALCIUM 8.5* 8.2* 8.1* 7.9* 8.1*   Liver Function Tests:  Recent Labs Lab  07/18/15 1404  AST 35  ALT 35  ALKPHOS 55  BILITOT 1.9*  PROT 5.6*  ALBUMIN 3.4*    Recent Labs Lab 07/18/15  1404  LIPASE 19   No results for input(s): AMMONIA in the last 168 hours. CBC:  Recent Labs Lab 07/18/15 1353 07/18/15 1840 07/19/15 0500  WBC 13.0* 13.2* 10.3  HGB 15.2 14.2 12.8*  HCT 47.1 43.5 38.8*  MCV 86.7 86.0 85.8  PLT 284 277 272   Cardiac Enzymes: No results for input(s): CKTOTAL, CKMB, CKMBINDEX, TROPONINI in the last 168 hours. BNP: BNP (last 3 results) No results for input(s): BNP in the last 8760 hours.  ProBNP (last 3 results) No results for input(s): PROBNP in the last 8760 hours.  CBG:  Recent Labs Lab 07/19/15 0146 07/19/15 0315 07/19/15 0601 07/19/15 0725 07/19/15 1159  GLUCAP 91 70 126* 98 196*      Signed:  Marcellus Scott, MD, FACP, FHM. [facility] Pager [phone]  If 7PM-7AM, please contact night-coverage [email or web address] Password [email or web address] 07/19/2015, 1:05 PM

## 2015-07-19 NOTE — Progress Notes (Signed)
[facility] at 2:15 PM.

## 2015-07-20 LAB — HEMOGLOBIN A1C
Hgb A1c MFr Bld: 11 % — ABNORMAL HIGH (ref 4.8–5.6)
Mean Plasma Glucose: 269 mg/dL

## 2015-08-10 ENCOUNTER — Ambulatory Visit: Payer: Self-pay | Admitting: Family Medicine

## 2016-03-21 ENCOUNTER — Encounter (HOSPITAL_COMMUNITY): Payer: Self-pay | Admitting: Emergency Medicine

## 2016-03-21 DIAGNOSIS — F17200 Nicotine dependence, unspecified, uncomplicated: Secondary | ICD-10-CM | POA: Insufficient documentation

## 2016-03-21 DIAGNOSIS — Z794 Long term (current) use of insulin: Secondary | ICD-10-CM | POA: Insufficient documentation

## 2016-03-21 DIAGNOSIS — E119 Type 2 diabetes mellitus without complications: Secondary | ICD-10-CM | POA: Insufficient documentation

## 2016-03-21 DIAGNOSIS — Z79899 Other long term (current) drug therapy: Secondary | ICD-10-CM | POA: Insufficient documentation

## 2016-03-21 DIAGNOSIS — R1031 Right lower quadrant pain: Secondary | ICD-10-CM | POA: Insufficient documentation

## 2016-03-21 LAB — COMPREHENSIVE METABOLIC PANEL
ALT: 34 U/L (ref 17–63)
AST: 23 U/L (ref 15–41)
Albumin: 4.7 g/dL (ref 3.5–5.0)
Alkaline Phosphatase: 76 U/L (ref 38–126)
Anion gap: 15 (ref 5–15)
BILIRUBIN TOTAL: 1.1 mg/dL (ref 0.3–1.2)
BUN: 24 mg/dL — AB (ref 6–20)
CALCIUM: 10 mg/dL (ref 8.9–10.3)
CO2: 24 mmol/L (ref 22–32)
CREATININE: 1.49 mg/dL — AB (ref 0.61–1.24)
Chloride: 102 mmol/L (ref 101–111)
GFR, EST NON AFRICAN AMERICAN: 55 mL/min — AB (ref >60)
Glucose, Bld: 406 mg/dL — ABNORMAL HIGH (ref 65–99)
Potassium: 4.3 mmol/L (ref 3.5–5.1)
Sodium: 141 mmol/L (ref 135–145)
TOTAL PROTEIN: 7.6 g/dL (ref 6.5–8.1)

## 2016-03-21 LAB — URINALYSIS, ROUTINE W REFLEX MICROSCOPIC
Bilirubin Urine: NEGATIVE
Ketones, ur: 40 mg/dL — AB
LEUKOCYTES UA: NEGATIVE
NITRITE: NEGATIVE
Protein, ur: NEGATIVE mg/dL
Specific Gravity, Urine: 1.015 (ref 1.005–1.030)
pH: 5.5 (ref 5.0–8.0)

## 2016-03-21 LAB — CBC
HCT: 43.8 % (ref 39.0–52.0)
Hemoglobin: 14.7 g/dL (ref 13.0–17.0)
MCH: 28.7 pg (ref 26.0–34.0)
MCHC: 33.6 g/dL (ref 30.0–36.0)
MCV: 85.5 fL (ref 78.0–100.0)
PLATELETS: 357 10*3/uL (ref 150–400)
RBC: 5.12 MIL/uL (ref 4.22–5.81)
RDW: 14.3 % (ref 11.5–15.5)
WBC: 8.3 10*3/uL (ref 4.0–10.5)

## 2016-03-21 LAB — URINE MICROSCOPIC-ADD ON

## 2016-03-21 LAB — LIPASE, BLOOD: LIPASE: 21 U/L (ref 11–51)

## 2016-03-21 LAB — CBG MONITORING, ED: Glucose-Capillary: 427 mg/dL — ABNORMAL HIGH (ref 65–99)

## 2016-03-21 NOTE — ED Notes (Addendum)
Pt. reports emesis with generalized weakness/fatigue onset today , denies diarrhea or fever . Hyperglycemic at triage .

## 2016-03-22 ENCOUNTER — Emergency Department (HOSPITAL_COMMUNITY): Payer: Self-pay

## 2016-03-22 ENCOUNTER — Emergency Department (HOSPITAL_COMMUNITY)
Admission: EM | Admit: 2016-03-22 | Discharge: 2016-03-22 | Disposition: A | Discharge status: Home / Self Care | Payer: Self-pay | Attending: Emergency Medicine | Admitting: Emergency Medicine

## 2016-03-22 DIAGNOSIS — R1031 Right lower quadrant pain: Secondary | ICD-10-CM

## 2016-03-22 MED ORDER — ONDANSETRON HCL 4 MG/2ML IJ SOLN
4.0000 mg | Freq: Once | INTRAMUSCULAR | Status: AC
  Administered 2016-03-22: 4 mg via INTRAVENOUS
  Filled 2016-03-22: qty 2

## 2016-03-22 MED ORDER — METOCLOPRAMIDE HCL 10 MG PO TABS: 10.0000 mg | ORAL_TABLET | Freq: Four times a day (QID) | ORAL | Status: DC

## 2016-03-22 MED ORDER — IOPAMIDOL (ISOVUE-300) INJECTION 61%
INTRAVENOUS | Status: AC
  Administered 2016-03-22: 100 mL
  Filled 2016-03-22: qty 100

## 2016-03-22 MED ORDER — SODIUM CHLORIDE 0.9 % IV BOLUS (SEPSIS)
1000.0000 mL | Freq: Once | INTRAVENOUS | Status: AC
  Administered 2016-03-22: 1000 mL via INTRAVENOUS

## 2016-03-22 NOTE — ED Provider Notes (Signed)
CSN: [id number]     Arrival date & time 03/21/16  2304 History   By signing my name below, I, Suzan Slick. Elon Spanner, attest that this documentation has been prepared under the direction and in the presence of Melene Plan, DO.  Electronically Signed: Suzan Slick. Elon Spanner, ED Scribe. 03/22/2016. 1:32 AM.   Chief Complaint  Patient presents with  . Emesis   The history is provided by the patient. No language interpreter was used.    HPI Comments: John Cox is a 45 y.o. male with a PMHx of DM who presents to the Emergency Department complaining of intermittent, unchanged vomiting with associated nausea onset 2:00 PM this afternoon. He also reports generalized weakness, fatigue, and lower abdominal pain. No aggravating or alleviating factors reported. No OTC medications or home remedies attempted prior to arrival. No recent fever, chills, chest pain, shortness of breath, or diarrhea. No prior history of abdominal surgeries. No known allergies to medications.  PCP: No primary care provider on file.    Past Medical History  Diagnosis Date  . Suicidal ideation   . Nausea & vomiting   . Depression 2000  . Diabetes mellitus 1995  . Polysubstance abuse 2012    relapse 2 wk ago    History reviewed. No pertinent past surgical history. Family History  Problem Relation Age of Onset  . Diabetes Mother   . COPD Mother   . Heart disease Mother    Social History  Substance Use Topics  . Smoking status: Current Every Day Smoker -- 0.00 packs/day  . Smokeless tobacco: None  . Alcohol Use: Yes    Review of Systems  Constitutional: Positive for fatigue. Negative for fever and chills.  HENT: Negative for congestion and facial swelling.   Eyes: Negative for discharge and visual disturbance.  Respiratory: Negative for shortness of breath.   Cardiovascular: Negative for chest pain and palpitations.  Gastrointestinal: Positive for nausea, vomiting and abdominal pain. Negative for diarrhea.   Musculoskeletal: Negative for myalgias and arthralgias.  Skin: Negative for color change and rash.  Neurological: Positive for weakness (Generalized). Negative for tremors, syncope and headaches.  Psychiatric/Behavioral: Negative for confusion and dysphoric mood.      Allergies  Review of patient's allergies indicates no known allergies.  Home Medications   Prior to Admission medications   Medication Sig Start Date End Date Taking? Authorizing Provider  acetaminophen (TYLENOL) 325 MG tablet Take 2 tablets (650 mg total) by mouth every 6 (six) hours as needed for mild pain, moderate pain, fever or headache. Total Tylenol/acetaminophen dose from all sources not to exceed 4 g per day. 07/19/15   Elease Etienne, MD  atorvastatin (LIPITOR) 40 MG tablet Take 1 tablet (40 mg total) by mouth daily. 07/19/15   Elease Etienne, MD  insulin NPH-regular Human (NOVOLIN 70/30) (70-30) 100 UNIT/ML injection Inject 25 Units into the skin 2 (two) times daily with a meal. 07/19/15   Elease Etienne, MD  Insulin Syringe-Needle U-100 (SAFETY-GLIDE 0.3CC SYR 29GX1/2) 29G X 1/2" 0.3 ML MISC Use as directed. 05/18/14   Elease Etienne, MD  metoCLOPramide (REGLAN) 10 MG tablet Take 1 tablet (10 mg total) by mouth every 6 (six) hours. 03/22/16   Melene Plan, DO  omeprazole (PRILOSEC) 20 MG capsule Take 1 capsule (20 mg total) by mouth 2 (two) times daily before a meal. 07/19/15   Elease Etienne, MD  oxyCODONE-acetaminophen (PERCOCET) 5-325 MG tablet Take 1-2 tablets by mouth every 6 (six) hours as  needed for severe pain. 07/15/15   Danelle Berry, PA-C   Triage Vitals: BP 127/78 mmHg  Pulse 91  Temp(Src) 98.3 F (36.8 C) (Oral)  Resp 16  Ht 5\' 10"  (1.778 m)  Wt 157 lb (71.215 kg)  BMI 22.53 kg/m2  SpO2 97%   Physical Exam  Constitutional: He is oriented to person, place, and time. He appears well-developed and well-nourished.  HENT:  Head: Normocephalic and atraumatic.  Eyes: EOM are normal. Pupils are  equal, round, and reactive to light.  Neck: Normal range of motion. Neck supple. No JVD present.  Cardiovascular: Normal rate and regular rhythm.  Exam reveals no gallop and no friction rub.   No murmur heard. Pulmonary/Chest: No respiratory distress. He has no wheezes.  Abdominal: He exhibits no distension. There is tenderness. There is no rebound and no guarding.  Tenderness to palpation RLQ. Positive Rosvig's sign.  Musculoskeletal: Normal range of motion.  Neurological: He is alert and oriented to person, place, and time.  Skin: No rash noted. No pallor.  Psychiatric: He has a normal mood and affect. His behavior is normal.  Nursing note and vitals reviewed.   ED Course  Procedures (including critical care time)  DIAGNOSTIC STUDIES: Oxygen Saturation is 95% on RA, adequate by my interpretation.    COORDINATION OF CARE: 1:33 AM- Will give Zofran. Will order imaging. Discussed treatment plan with pt at bedside and pt agreed to plan.     Labs Review Labs Reviewed  COMPREHENSIVE METABOLIC PANEL - Abnormal; Notable for the following:    Glucose, Bld 406 (*)    BUN 24 (*)    Creatinine, Ser 1.49 (*)    GFR calc non Af Amer 55 (*)    All other components within normal limits  URINALYSIS, ROUTINE W REFLEX MICROSCOPIC (NOT AT [facility]) - Abnormal; Notable for the following:    Glucose, UA >1000 (*)    Hgb urine dipstick SMALL (*)    Ketones, ur 40 (*)    All other components within normal limits  URINE MICROSCOPIC-ADD ON - Abnormal; Notable for the following:    Squamous Epithelial / LPF 0-5 (*)    Bacteria, UA RARE (*)    Casts HYALINE CASTS (*)    All other components within normal limits  CBG MONITORING, ED - Abnormal; Notable for the following:    Glucose-Capillary 427 (*)    All other components within normal limits  CBC  LIPASE, BLOOD    Imaging Review Ct Abdomen Pelvis W Contrast  03/22/2016  CLINICAL DATA:  Right lower quadrant pain for a few days. EXAM: CT ABDOMEN  AND PELVIS WITH CONTRAST TECHNIQUE: Multidetector CT imaging of the abdomen and pelvis was performed using the standard protocol following bolus administration of intravenous contrast. CONTRAST:  ISOVUE-300 IOPAMIDOL (ISOVUE-300) INJECTION 61% COMPARISON:  05/15/2014 FINDINGS: The visualized lung bases are clear. Diffusely decreased attenuation of the liver is suggestive of mild steatosis. The gallbladder, spleen, adrenal glands, kidneys, and pancreas are unremarkable. There is no evidence of bowel obstruction or inflammation. The appendix is identified in the right lower quadrant is unremarkable. The bladder and prostate are unremarkable. The aorta is normal in caliber with mild atherosclerotic calcification. No free fluid or enlarged lymph nodes are identified. No acute osseous abnormality is seen. IMPRESSION: 1. No acute abnormality identified in the abdomen or pelvis. 2. Aortic atherosclerosis. Electronically Signed   By: Sebastian Ache M.D.   On: 03/22/2016 03:31   I have personally reviewed and evaluated  these images and lab results as part of my medical decision-making.   EKG Interpretation None      MDM   Final diagnoses:  Right lower quadrant abdominal pain    45 yo M with nausea and vomiting. The patient has some significant right lower quadrant tenderness on my exam. CT scan was ordered. Hyperglycemic without DKA. CT scan read as negative by the radiologist. On my review patient has a very large stool burden. Discussed this with the patient and recommended a MiraLAX cleanout. PCP follow up.  I personally performed the services described in this documentation, which was scribed in my presence. The recorded information has been reviewed and is accurate.     I have discussed the diagnosis/risks/treatment options with the patient and believe the pt to be eligible for discharge home to follow-up with PCP. We also discussed returning to the ED immediately if new or worsening sx occur. We  discussed the sx which are most concerning (e.g., sudden worsening pain, fever, inability to tolerate by mouth) that necessitate immediate return. Medications administered to the patient during their visit and any new prescriptions provided to the patient are listed below.  Medications given during this visit Medications  sodium chloride 0.9 % bolus 1,000 mL (0 mLs Intravenous Stopped 03/22/16 0352)  ondansetron (ZOFRAN) injection 4 mg (4 mg Intravenous Given 03/22/16 0145)  iopamidol (ISOVUE-300) 61 % injection (100 mLs  Contrast Given 03/22/16 0242)    Discharge Medication List as of 03/22/2016  4:30 AM    START taking these medications   Details  metoCLOPramide (REGLAN) 10 MG tablet Take 1 tablet (10 mg total) by mouth every 6 (six) hours., Starting 03/22/2016, Until Discontinued, Print        The patient appears reasonably screen and/or stabilized for discharge and I doubt any other medical condition or other [facility] requiring further screening, evaluation, or treatment in the ED at this time prior to discharge.     Melene Plan, DO 03/22/16 [phone]

## 2016-03-22 NOTE — Discharge Instructions (Signed)
Try miralax for constipation.  Abdominal Pain, Adult Many things can cause abdominal pain. Usually, abdominal pain is not caused by a disease and will improve without treatment. It can often be observed and treated at home. Your health care provider will do a physical exam and possibly order blood tests and X-rays to help determine the seriousness of your pain. However, in many cases, more time must pass before a clear cause of the pain can be found. Before that point, your health care provider may not know if you need more testing or further treatment. HOME CARE INSTRUCTIONS Monitor your abdominal pain for any changes. The following actions may help to alleviate any discomfort you are experiencing:  Only take over-the-counter or prescription medicines as directed by your health care provider.  Do not take laxatives unless directed to do so by your health care provider.  Try a clear liquid diet (broth, tea, or water) as directed by your health care provider. Slowly move to a bland diet as tolerated. SEEK MEDICAL CARE IF:  You have unexplained abdominal pain.  You have abdominal pain associated with nausea or diarrhea.  You have pain when you urinate or have a bowel movement.  You experience abdominal pain that wakes you in the night.  You have abdominal pain that is worsened or improved by eating food.  You have abdominal pain that is worsened with eating fatty foods.  You have a fever. SEEK IMMEDIATE MEDICAL CARE IF:  Your pain does not go away within 2 hours.  You keep throwing up (vomiting).  Your pain is felt only in portions of the abdomen, such as the right side or the left lower portion of the abdomen.  You pass bloody or black tarry stools. MAKE SURE YOU:  Understand these instructions.  Will watch your condition.  Will get help right away if you are not doing well or get worse.   This information is not intended to replace advice given to you by your health care  provider. Make sure you discuss any questions you have with your health care provider.   Document Released: 05/28/2005 Document Revised: 05/09/2015 Document Reviewed: 04/27/2013 Elsevier Interactive Patient Education [employer]

## 2016-03-22 NOTE — ED Notes (Signed)
Pt to CT

## 2016-03-22 NOTE — ED Notes (Signed)
Pt called out asking for a drink, water was provided. Will PO challenge and continue to monitor.

## 2016-09-29 ENCOUNTER — Ambulatory Visit (HOSPITAL_COMMUNITY)
Admission: EM | Admit: 2016-09-29 | Discharge: 2016-09-29 | Disposition: A | Discharge status: Home / Self Care | Payer: Self-pay | Attending: Emergency Medicine | Admitting: Emergency Medicine

## 2016-09-29 ENCOUNTER — Encounter (HOSPITAL_COMMUNITY): Payer: Self-pay | Admitting: Emergency Medicine

## 2016-09-29 DIAGNOSIS — M545 Low back pain, unspecified: Secondary | ICD-10-CM

## 2016-09-29 MED ORDER — MELOXICAM 15 MG PO TABS: 15.0000 mg | ORAL_TABLET | Freq: Every day | ORAL | 0 refills | Status: DC

## 2016-09-29 MED ORDER — CYCLOBENZAPRINE HCL 10 MG PO TABS: 10.0000 mg | ORAL_TABLET | Freq: Two times a day (BID) | ORAL | 0 refills | Status: DC | PRN
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
## 2016-09-29 NOTE — Discharge Instructions (Signed)
Your lower back pain is most likely related to muscle spasm. I have sent two medicines to your pharmacy. Flexeril is a muscle relaxant, take 1 tablet twice a day. This medicine may cause drowsiness. Do not drink alcohol or drive while taking this medicine. The other medicine is an antiinflammatory called Mobic. Take 1 tablet daily. You make take tylenol if needed in addition to these medicines every 4 hours, not to exceed 4000 mg a day. Should your pain persist I recommend you follow up with your primary care provider or return to clinic.

## 2017-05-06 ENCOUNTER — Encounter (HOSPITAL_COMMUNITY): Payer: Self-pay | Admitting: *Deleted

## 2017-05-06 ENCOUNTER — Emergency Department (HOSPITAL_COMMUNITY)
Admission: EM | Admit: 2017-05-06 | Discharge: 2017-05-06 | Disposition: A | Discharge status: Home / Self Care | Payer: Self-pay | Attending: Emergency Medicine | Admitting: Emergency Medicine

## 2017-05-06 ENCOUNTER — Emergency Department (HOSPITAL_COMMUNITY): Payer: Self-pay

## 2017-05-06 DIAGNOSIS — E119 Type 2 diabetes mellitus without complications: Secondary | ICD-10-CM | POA: Insufficient documentation

## 2017-05-06 DIAGNOSIS — Z79899 Other long term (current) drug therapy: Secondary | ICD-10-CM | POA: Insufficient documentation

## 2017-05-06 DIAGNOSIS — R079 Chest pain, unspecified: Secondary | ICD-10-CM | POA: Insufficient documentation

## 2017-05-06 DIAGNOSIS — F17200 Nicotine dependence, unspecified, uncomplicated: Secondary | ICD-10-CM | POA: Insufficient documentation

## 2017-05-06 DIAGNOSIS — Z794 Long term (current) use of insulin: Secondary | ICD-10-CM | POA: Insufficient documentation

## 2017-05-06 DIAGNOSIS — R1030 Lower abdominal pain, unspecified: Secondary | ICD-10-CM | POA: Insufficient documentation

## 2017-05-06 LAB — URINALYSIS, ROUTINE W REFLEX MICROSCOPIC
BILIRUBIN URINE: NEGATIVE
KETONES UR: 5 mg/dL — AB
Leukocytes, UA: NEGATIVE
NITRITE: NEGATIVE
PH: 5 (ref 5.0–8.0)
Protein, ur: 100 mg/dL — AB
SPECIFIC GRAVITY, URINE: 1.037 — AB (ref 1.005–1.030)
SQUAMOUS EPITHELIAL / LPF: NONE SEEN

## 2017-05-06 LAB — COMPREHENSIVE METABOLIC PANEL
ALT: 19 U/L (ref 17–63)
AST: 19 U/L (ref 15–41)
Albumin: 3.8 g/dL (ref 3.5–5.0)
Alkaline Phosphatase: 58 U/L (ref 38–126)
Anion gap: 11 (ref 5–15)
BILIRUBIN TOTAL: 1 mg/dL (ref 0.3–1.2)
BUN: 15 mg/dL (ref 6–20)
CO2: 24 mmol/L (ref 22–32)
CREATININE: 1.11 mg/dL (ref 0.61–1.24)
Calcium: 8.5 mg/dL — ABNORMAL LOW (ref 8.9–10.3)
Chloride: 98 mmol/L — ABNORMAL LOW (ref 101–111)
Glucose, Bld: 376 mg/dL — ABNORMAL HIGH (ref 65–99)
Potassium: 3.5 mmol/L (ref 3.5–5.1)
Sodium: 133 mmol/L — ABNORMAL LOW (ref 135–145)
TOTAL PROTEIN: 6.1 g/dL — AB (ref 6.5–8.1)

## 2017-05-06 LAB — I-STAT TROPONIN, ED: Troponin i, poc: 0 ng/mL (ref 0.00–0.08)

## 2017-05-06 LAB — CBG MONITORING, ED: Glucose-Capillary: 339 mg/dL — ABNORMAL HIGH (ref 65–99)

## 2017-05-06 LAB — CBC
HCT: 36.5 % — ABNORMAL LOW (ref 39.0–52.0)
Hemoglobin: 12.1 g/dL — ABNORMAL LOW (ref 13.0–17.0)
MCH: 27.6 pg (ref 26.0–34.0)
MCHC: 33.2 g/dL (ref 30.0–36.0)
MCV: 83.1 fL (ref 78.0–100.0)
PLATELETS: 299 10*3/uL (ref 150–400)
RBC: 4.39 MIL/uL (ref 4.22–5.81)
RDW: 13.3 % (ref 11.5–15.5)
WBC: 4.2 10*3/uL (ref 4.0–10.5)

## 2017-05-06 LAB — LIPASE, BLOOD: LIPASE: 110 U/L — AB (ref 11–51)

## 2017-05-06 MED ORDER — ONDANSETRON 4 MG PO TBDP: 4.0000 mg | ORAL_TABLET | Freq: Three times a day (TID) | ORAL | 0 refills | Status: DC | PRN

## 2017-05-06 MED ORDER — ONDANSETRON HCL 4 MG/2ML IJ SOLN
4.0000 mg | Freq: Once | INTRAMUSCULAR | Status: AC
  Administered 2017-05-06: 4 mg via INTRAVENOUS
  Filled 2017-05-06: qty 2

## 2017-05-06 MED ORDER — MORPHINE SULFATE (PF) 4 MG/ML IV SOLN
4.0000 mg | INTRAVENOUS | Status: DC | PRN
  Administered 2017-05-06 (×2): 4 mg via INTRAVENOUS
  Filled 2017-05-06 (×2): qty 1

## 2017-05-06 MED ORDER — IOPAMIDOL (ISOVUE-370) INJECTION 76%
INTRAVENOUS | Status: AC
  Administered 2017-05-06: 100 mL
  Filled 2017-05-06: qty 100

## 2017-05-06 MED ORDER — SODIUM CHLORIDE 0.9 % IV BOLUS (SEPSIS)
1000.0000 mL | Freq: Once | INTRAVENOUS | Status: AC
  Administered 2017-05-06: 1000 mL via INTRAVENOUS

## 2017-05-06 NOTE — ED Notes (Signed)
Pt verbalized understanding discharge instructions and denies any further needs or questions at this time. VS stable, ambulatory and steady gait.   Pt is calling for ride home

## 2017-05-06 NOTE — ED Notes (Signed)
Patient continues to try to sleep through assessment. RN repeatedly awakens patient to ask questions. Repositioned bed in order to facilitate patient awakening unsuccessfully. When asked pain level, patient states 8 on 0-10 pain scale, and then promptly falls back to sleep.

## 2017-05-06 NOTE — ED Notes (Signed)
Patient sleeping in darkened room. Removed BP cuff. Cuff reapplied to L arm. No distress noted.

## 2017-05-06 NOTE — ED Triage Notes (Signed)
Pt reports abd pain and chest pain with n/v. Pt reports feeling like his cbg is elevated but has not checked it.

## 2017-05-06 NOTE — ED Provider Notes (Signed)
[facility]-EMERGENCY DEPT Provider Note   CSN: [id number] Arrival date & time: 05/06/17  0815  History   Chief Complaint Chief Complaint  Patient presents with  . Abdominal Pain  . Chest Pain   HPI John Cox is a 46 y.o. male.  Patient presents with a 2-3 day history of nausea and vomitting. The patient was poor historian and did not actively participate with interview. He was laying comfortably in bed during encounter and appeared very sleepy. He states that he has been experiencing chest pain and abdominal pain for the past 2-3 days. His chest pain is not exertional and does not radiate anywhere. His abdominal pain is mainly localized to his lower abdomen. He states he finally decided to seek care because he noticed pink blood in his emesis last night. He reports one episode of diarrhea may be 3-5 days ago. Denies blood in his stool. Patient reports that he used cocaine 2-3 days ago and drinks about 1 beer per day.     Past Medical History:  Diagnosis Date  . Depression 2000  . Diabetes mellitus 1995  . Nausea & vomiting   . Polysubstance abuse 2012   relapse 2 wk ago   . Suicidal ideation    Patient Active Problem List   Diagnosis Date Noted  . Malnutrition of moderate degree 07/19/2015  . DKA, type 2, not at goal [facility]) 07/18/2015  . Hyperkalemia 07/18/2015  . AKI (acute kidney injury) (HCC) 07/18/2015  . Nausea vomiting and diarrhea 07/18/2015  . Marijuana abuse 07/18/2015  . Dehydration 07/18/2015  . Dental caries 07/18/2015  . Hyperlipidemia associated with type 2 diabetes mellitus (HCC) 05/24/2014  . Polysubstance abuse 05/13/2014  . Major depression, recurrent (HCC) 05/13/2014  . Suicidal ideations 05/13/2014  . ANTICARDIOLIPIN ANTIBODY SYNDROME 05/12/2007  . DIABETES MELLITUS, TYPE I 04/26/2007  . TOBACCO ABUSE 04/26/2007  . 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 No pertinent surgical history.   Home Medications    Prior to Admission  medications   Medication Sig Start Date End Date Taking? Authorizing Provider  cyclobenzaprine (FLEXERIL) 10 MG tablet Take 1 tablet (10 mg total) by mouth 2 (two) times daily as needed for muscle spasms. 09/29/16   Dorena Bodo, NP  insulin NPH-regular Human (NOVOLIN 70/30) (70-30) 100 UNIT/ML injection Inject 25 Units into the skin 2 (two) times daily with a meal. 07/19/15   Hongalgi, Maximino Greenland, MD  Insulin Syringe-Needle U-100 (SAFETY-GLIDE 0.3CC SYR 29GX1/2) 29G X 1/2" 0.3 ML MISC Use as directed. 05/18/14   Hongalgi, Maximino Greenland, MD  meloxicam (MOBIC) 15 MG tablet Take 1 tablet (15 mg total) by mouth daily. 09/29/16   Dorena Bodo, NP  ondansetron (ZOFRAN-ODT) 4 MG disintegrating tablet Take 1 tablet (4 mg total) by mouth every 8 (eight) hours as needed for nausea or vomiting. 05/06/17   Rozann Lesches, MD   Family History Family History  Problem Relation Age of Onset  . Diabetes Mother   . COPD Mother   . Heart disease Mother    Social History Social History  Substance Use Topics  . Smoking status: Current Every Day Smoker    Packs/day: 0.00  . Smokeless tobacco: Not on file  . Alcohol use Yes    Allergies   Patient has no known allergies.   Review of Systems Review of Systems  Constitutional: Positive for activity change and appetite change. Negative for diaphoresis, fatigue, fever and unexpected weight change.  HENT: Positive for congestion. Negative for ear  pain, facial swelling, rhinorrhea, sinus pain and sinus pressure.   Respiratory: Negative for cough, chest tightness and shortness of breath.   Cardiovascular: Positive for chest pain. Negative for palpitations and leg swelling.  Gastrointestinal: Positive for abdominal pain (lower quadrants), diarrhea (3-5 days ago) and vomiting (2x 24 hours ago). Negative for blood in stool.  Genitourinary: Negative for difficulty urinating, dysuria, flank pain, frequency and hematuria.  Neurological: Positive for headaches.    Hematological: Negative for adenopathy.    Physical Exam Updated Vital Signs BP 133/81   Pulse 74   Resp (!) 0   SpO2 98%   Physical Exam  Constitutional:  Patient laying comfortably in bed with limited participation during exam  HENT:  Mouth/Throat: Oropharynx is clear and moist. No oropharyngeal exudate.  Eyes: Pupils are equal, round, and reactive to light. Conjunctivae and EOM are normal.  Cardiovascular: Normal rate, regular rhythm and intact distal pulses.  Exam reveals no friction rub.   No murmur heard. Pulmonary/Chest: Effort normal. No respiratory distress. He has no wheezes.  Abdominal: Soft. He exhibits no distension and no mass. There is tenderness (with palpation of lower quadrant). There is rebound. There is no guarding.  Musculoskeletal: He exhibits no edema (of lower extremities) or tenderness (of lower extremities).  Lymphadenopathy:    He has no cervical adenopathy.  Skin: Skin is warm and dry. Capillary refill takes less than 2 seconds. No rash noted. No erythema.    ED Treatments / Results  Labs (all labs ordered are listed, but only abnormal results are displayed) Labs Reviewed  LIPASE, BLOOD - Abnormal; Notable for the following:       Result Value   Lipase 110 (*)    All other components within normal limits  COMPREHENSIVE METABOLIC PANEL - Abnormal; Notable for the following:    Sodium 133 (*)    Chloride 98 (*)    Glucose, Bld 376 (*)    Calcium 8.5 (*)    Total Protein 6.1 (*)    All other components within normal limits  CBC - Abnormal; Notable for the following:    Hemoglobin 12.1 (*)    HCT 36.5 (*)    All other components within normal limits  URINALYSIS, ROUTINE W REFLEX MICROSCOPIC - Abnormal; Notable for the following:    Specific Gravity, Urine 1.037 (*)    Glucose, UA >=500 (*)    Hgb urine dipstick SMALL (*)    Ketones, ur 5 (*)    Protein, ur 100 (*)    Bacteria, UA RARE (*)    All other components within normal limits  CBG  MONITORING, ED - Abnormal; Notable for the following:    Glucose-Capillary 339 (*)    All other components within normal limits  I-STAT TROPONIN, ED   EKG  EKG Interpretation  Date/Time:  Wednesday May 06 2017 08:18:24 EDT Ventricular Rate:  94 PR Interval:  150 QRS Duration: 100 QT Interval:  374 QTC Calculation: 467 R Axis:   53 Text Interpretation:  Poor data quality, interpretation may be adversely affected Normal sinus rhythm Possible Left atrial enlargement Incomplete right bundle branch block Left ventricular hypertrophy Abnormal ECG Confirmed by Rolland Porter ([id number]) on 05/06/2017 9:21:29 AM      Radiology Ct Angio Abd/pel W/ And/or W/o  Result Date: 05/06/2017 CLINICAL DATA:  46 year old male with a history of abdominal pain, nausea/ vomiting. Concern for chronic mesenteric ischemia EXAM: CTA ABDOMEN AND PELVIS WITH CONTRAST TECHNIQUE: Multidetector CT imaging of the abdomen and pelvis  was performed using the standard protocol during bolus administration of intravenous contrast. Multiplanar reconstructed images and MIPs were obtained and reviewed to evaluate the vascular anatomy. CONTRAST:  100 cc Isovue 370 COMPARISON:  03/22/2016, 05/15/2014 FINDINGS: VASCULAR Aorta: Unremarkable course, caliber, contour of the abdominal aorta. No dissection, aneurysm, or periaortic fluid. Minimal calcifications of the infrarenal abdominal aorta. Celiac: No significant atherosclerotic changes at the origin of the celiac artery. Typical branch pattern of the celiac artery, with left gastric, common hepatic, and splenic artery identified. SMA: No significant atherosclerotic changes of the superior mesenteric artery. Renals: Renal arteries are patent. Single left and single right renal artery. IMA: Inferior mesenteric artery is patent. Left colic artery patent. Superior rectal artery patent. Right lower extremity: Unremarkable course, caliber, and contour of the right iliac system. No aneurysm,  dissection, or occlusion. Hypogastric artery is patent. Anterior and posterior division patent. Common femoral artery patent. Minimal atherosclerotic changes of the right iliac system. Left lower extremity: Unremarkable course, caliber, and contour of the left iliac system. No aneurysm, dissection, or occlusion. Hypogastric artery is patent. Anterior and posterior division patent. Common femoral artery patent. Minimal atherosclerotic changes of the left iliac system. Veins: Unremarkable appearance of the venous system. Review of the MIP images confirms the above findings. NON-VASCULAR Lower chest: Unremarkable. Hepatobiliary: Unremarkable appearance of the liver. Unremarkable gall bladder. Pancreas: Unremarkable appearance of the pancreas. No pericholecystic fluid or inflammatory changes. Unremarkable ductal system. Spleen: Unremarkable. Adrenals/Urinary Tract: Unremarkable appearance of adrenal glands. Right: No hydronephrosis. Symmetric perfusion to the left. No nephrolithiasis. Unremarkable course of the right ureter. Left: No hydronephrosis. Symmetric perfusion to the right. No nephrolithiasis. Unremarkable course of the left ureter. Unremarkable appearance of the urinary bladder . Stomach/Bowel: Unremarkable appearance of the stomach. Unremarkable appearance of small bowel. No evidence of obstruction. No colonic diverticula. Normal appendix. Lymphatic: Multiple lymph nodes in the para-aortic nodal station, none of which are enlarged. Mesenteric: No free fluid or air. No adenopathy. Reproductive: Unremarkable appearance of the pelvic organs. Other: No hernia. Musculoskeletal: No evidence of acute fracture. No bony canal narrowing. No significant degenerative changes of the hips. IMPRESSION: No acute CT finding to account for the patient's abdominal pain. No significant atherosclerotic changes of the mesenteric vessels, with patency maintained. Aortic Atherosclerosis (ICD10-I70.0). Electronically Signed   By:  Gilmer Mor D.O.   On: 05/06/2017 12:31   Procedures Procedures (including critical care time)  Medications Ordered in ED Medications  morphine 4 MG/ML injection 4 mg (4 mg Intravenous Given 05/06/17 1229)  ondansetron (ZOFRAN) injection 4 mg (4 mg Intravenous Given 05/06/17 1128)  sodium chloride 0.9 % bolus 1,000 mL (0 mLs Intravenous Stopped 05/06/17 1304)  iopamidol (ISOVUE-370) 76 % injection (100 mLs  Contrast Given 05/06/17 1145)    Initial Impression / Assessment and Plan / ED Course  I have reviewed the triage vital signs and the nursing notes.  Pertinent labs & imaging results that were available during my care of the patient were reviewed by me and considered in my medical decision making (see chart for details).  Clinical Course as of May 06 1312  Wed May 06, 2017  1156 Squamous Epithelial / LPF: NONE SEEN [MN]    Clinical Course User Index [MN] Rozann Lesches, MD   Patient is a poor historian with limited participation during interview. He endorses history of nausea and vomiting, with elevated lipase on labs and diffuse lower quadrant pain on PE. These things, along with history of alcohol use, could represent an  atypical presentation of pancreatitis. The patient also has history of venous clots, including portal vein thrombosis, per chart review. The patient does not recall these events or have an idea what caused these events in the past. His recent history of cocaine abuse, which coincides with onset of abdominal pain. This is concerning for mesenteric ischemia, however the patient's PE is rather benign and his VS are stable within normal limits, both entities which are not consistent with this diagnosis. The patient will receive 1 L NS bolus and will obtain CT abdomen and pelvis with contrast to evaluate for the above differential.  Final Clinical Impressions(s) / ED Diagnoses   Final diagnoses:  Lower abdominal pain   Patient's imaging studies do not show evidence of  pancreatitis, hepatic venous thrombosis, or mesenteric ischemia. The patient likely has a viral gastroenteritis which has caused his nausea and acute vomiting. The patient was instructed to continue use of anti-emetic medications and to drink plenty of fluids upon discharge. The patient states that he feels well enough to eat and drink on his own. Plan to discharge with return precautions.  New Prescriptions New Prescriptions   ONDANSETRON (ZOFRAN-ODT) 4 MG DISINTEGRATING TABLET    Take 1 tablet (4 mg total) by mouth every 8 (eight) hours as needed for nausea or vomiting.     Rozann LeschesNedrud, Jaxsyn Catalfamo, MD 05/06/17 1314    Rolland PorterJames, Mark, MD 05/16/17 [id number]

## 2017-11-13 ENCOUNTER — Emergency Department (HOSPITAL_COMMUNITY): Payer: Self-pay

## 2017-11-13 ENCOUNTER — Encounter (HOSPITAL_COMMUNITY): Payer: Self-pay

## 2017-11-13 ENCOUNTER — Observation Stay (HOSPITAL_COMMUNITY)
Admission: EM | Admit: 2017-11-13 | Discharge: 2017-11-15 | Disposition: A | Discharge status: Home / Self Care | Payer: Self-pay | Attending: Internal Medicine | Admitting: Internal Medicine

## 2017-11-13 DIAGNOSIS — E160 Drug-induced hypoglycemia without coma: Secondary | ICD-10-CM | POA: Diagnosis present

## 2017-11-13 DIAGNOSIS — E10649 Type 1 diabetes mellitus with hypoglycemia without coma: Principal | ICD-10-CM | POA: Insufficient documentation

## 2017-11-13 DIAGNOSIS — E440 Moderate protein-calorie malnutrition: Secondary | ICD-10-CM | POA: Diagnosis present

## 2017-11-13 DIAGNOSIS — N179 Acute kidney failure, unspecified: Secondary | ICD-10-CM | POA: Diagnosis present

## 2017-11-13 DIAGNOSIS — E119 Type 2 diabetes mellitus without complications: Secondary | ICD-10-CM

## 2017-11-13 DIAGNOSIS — R262 Difficulty in walking, not elsewhere classified: Secondary | ICD-10-CM | POA: Insufficient documentation

## 2017-11-13 DIAGNOSIS — E785 Hyperlipidemia, unspecified: Secondary | ICD-10-CM | POA: Insufficient documentation

## 2017-11-13 DIAGNOSIS — E109 Type 1 diabetes mellitus without complications: Secondary | ICD-10-CM | POA: Diagnosis present

## 2017-11-13 DIAGNOSIS — F17210 Nicotine dependence, cigarettes, uncomplicated: Secondary | ICD-10-CM | POA: Insufficient documentation

## 2017-11-13 DIAGNOSIS — F339 Major depressive disorder, recurrent, unspecified: Secondary | ICD-10-CM | POA: Diagnosis present

## 2017-11-13 DIAGNOSIS — F1910 Other psychoactive substance abuse, uncomplicated: Secondary | ICD-10-CM | POA: Diagnosis present

## 2017-11-13 DIAGNOSIS — E162 Hypoglycemia, unspecified: Secondary | ICD-10-CM | POA: Diagnosis present

## 2017-11-13 DIAGNOSIS — F17200 Nicotine dependence, unspecified, uncomplicated: Secondary | ICD-10-CM | POA: Diagnosis present

## 2017-11-13 DIAGNOSIS — F1210 Cannabis abuse, uncomplicated: Secondary | ICD-10-CM | POA: Insufficient documentation

## 2017-11-13 DIAGNOSIS — Z79899 Other long term (current) drug therapy: Secondary | ICD-10-CM | POA: Insufficient documentation

## 2017-11-13 DIAGNOSIS — T383X5A Adverse effect of insulin and oral hypoglycemic [antidiabetic] drugs, initial encounter: Secondary | ICD-10-CM

## 2017-11-13 DIAGNOSIS — Z794 Long term (current) use of insulin: Secondary | ICD-10-CM | POA: Insufficient documentation

## 2017-11-13 LAB — COMPREHENSIVE METABOLIC PANEL
ALT: 28 U/L (ref 17–63)
AST: 19 U/L (ref 15–41)
Albumin: 4.8 g/dL (ref 3.5–5.0)
Alkaline Phosphatase: 95 U/L (ref 38–126)
Anion gap: 13 (ref 5–15)
BUN: 30 mg/dL — AB (ref 6–20)
CHLORIDE: 102 mmol/L (ref 101–111)
CO2: 30 mmol/L (ref 22–32)
CREATININE: 1.57 mg/dL — AB (ref 0.61–1.24)
Calcium: 10.6 mg/dL — ABNORMAL HIGH (ref 8.9–10.3)
GFR calc Af Amer: 59 mL/min — ABNORMAL LOW (ref >60)
GFR calc non Af Amer: 51 mL/min — ABNORMAL LOW (ref >60)
Glucose, Bld: 55 mg/dL — ABNORMAL LOW (ref 65–99)
POTASSIUM: 4.3 mmol/L (ref 3.5–5.1)
SODIUM: 145 mmol/L (ref 135–145)
Total Bilirubin: 1 mg/dL (ref 0.3–1.2)
Total Protein: 8 g/dL (ref 6.5–8.1)

## 2017-11-13 LAB — I-STAT CHEM 8, ED
BUN: 33 mg/dL — ABNORMAL HIGH (ref 6–20)
BUN: 33 mg/dL — ABNORMAL HIGH (ref 6–20)
CALCIUM ION: 1.24 mmol/L (ref 1.15–1.40)
CHLORIDE: 105 mmol/L (ref 101–111)
Calcium, Ion: 1.24 mmol/L (ref 1.15–1.40)
Chloride: 101 mmol/L (ref 101–111)
Creatinine, Ser: 1.6 mg/dL — ABNORMAL HIGH (ref 0.61–1.24)
Creatinine, Ser: 1.4 mg/dL — ABNORMAL HIGH (ref 0.61–1.24)
GLUCOSE: 35 mg/dL — AB (ref 65–99)
Glucose, Bld: 57 mg/dL — ABNORMAL LOW (ref 65–99)
HCT: 51 % (ref 39.0–52.0)
HEMATOCRIT: 53 % — AB (ref 39.0–52.0)
Hemoglobin: 18 g/dL — ABNORMAL HIGH (ref 13.0–17.0)
Hemoglobin: 17.3 g/dL — ABNORMAL HIGH (ref 13.0–17.0)
POTASSIUM: 3.4 mmol/L — AB (ref 3.5–5.1)
Potassium: 4.1 mmol/L (ref 3.5–5.1)
SODIUM: 146 mmol/L — AB (ref 135–145)
SODIUM: 146 mmol/L — AB (ref 135–145)
TCO2: 33 mmol/L — AB (ref 22–32)
TCO2: 30 mmol/L (ref 22–32)

## 2017-11-13 LAB — CBG MONITORING, ED
GLUCOSE-CAPILLARY: 51 mg/dL — AB (ref 65–99)
GLUCOSE-CAPILLARY: 70 mg/dL (ref 65–99)
GLUCOSE-CAPILLARY: 75 mg/dL (ref 65–99)
GLUCOSE-CAPILLARY: 87 mg/dL (ref 65–99)
GLUCOSE-CAPILLARY: 136 mg/dL — AB (ref 65–99)
Glucose-Capillary: 306 mg/dL — ABNORMAL HIGH (ref 65–99)
Glucose-Capillary: 77 mg/dL (ref 65–99)
Glucose-Capillary: 147 mg/dL — ABNORMAL HIGH (ref 65–99)
Glucose-Capillary: 51 mg/dL — ABNORMAL LOW (ref 65–99)
Glucose-Capillary: 31 mg/dL — CL (ref 65–99)

## 2017-11-13 LAB — CBC WITH DIFFERENTIAL/PLATELET
BASOS ABS: 0 10*3/uL (ref 0.0–0.1)
BASOS PCT: 0 %
EOS ABS: 0 10*3/uL (ref 0.0–0.7)
EOS PCT: 0 %
HCT: 49.1 % (ref 39.0–52.0)
Hemoglobin: 16.6 g/dL (ref 13.0–17.0)
Lymphocytes Relative: 22 %
Lymphs Abs: 1.3 10*3/uL (ref 0.7–4.0)
MCH: 29 pg (ref 26.0–34.0)
MCHC: 33.8 g/dL (ref 30.0–36.0)
MCV: 85.7 fL (ref 78.0–100.0)
MONO ABS: 0.4 10*3/uL (ref 0.1–1.0)
Monocytes Relative: 6 %
Neutro Abs: 4.3 10*3/uL (ref 1.7–7.7)
Neutrophils Relative %: 72 %
PLATELETS: 278 10*3/uL (ref 150–400)
RBC: 5.73 MIL/uL (ref 4.22–5.81)
RDW: 14.4 % (ref 11.5–15.5)
WBC: 6 10*3/uL (ref 4.0–10.5)

## 2017-11-13 LAB — GLUCOSE, CAPILLARY
GLUCOSE-CAPILLARY: 149 mg/dL — AB (ref 65–99)
GLUCOSE-CAPILLARY: 211 mg/dL — AB (ref 65–99)

## 2017-11-13 LAB — I-STAT CG4 LACTIC ACID, ED
Lactic Acid, Venous: 2.15 mmol/L (ref 0.5–1.9)
Lactic Acid, Venous: 0.9 mmol/L (ref 0.5–1.9)

## 2017-11-13 LAB — SALICYLATE LEVEL: Salicylate Lvl: <7 mg/dL (ref 2.8–30.0)

## 2017-11-13 LAB — I-STAT TROPONIN, ED: Troponin i, poc: 0.02 ng/mL (ref 0.00–0.08)

## 2017-11-13 LAB — ACETAMINOPHEN LEVEL: Acetaminophen (Tylenol), Serum: <10 ug/mL — ABNORMAL LOW (ref 10–30)

## 2017-11-13 LAB — INFLUENZA PANEL BY PCR (TYPE A & B)
Influenza A By PCR: NEGATIVE
Influenza B By PCR: NEGATIVE

## 2017-11-13 LAB — MRSA PCR SCREENING: MRSA BY PCR: NEGATIVE

## 2017-11-13 LAB — ETHANOL

## 2017-11-13 MED ORDER — OCTREOTIDE ACETATE 500 MCG/ML IJ SOLN
50.0000 ug/h | INTRAMUSCULAR | Status: DC
  Administered 2017-11-13 – 2017-11-14 (×2): 50 ug/h via INTRAVENOUS
  Filled 2017-11-13 (×3): qty 1

## 2017-11-13 MED ORDER — SODIUM CHLORIDE 0.9 % IV SOLN: 250.0000 mL | INTRAVENOUS | Status: DC | PRN

## 2017-11-13 MED ORDER — ENSURE ENLIVE PO LIQD
237.0000 mL | Freq: Two times a day (BID) | ORAL | Status: DC
  Administered 2017-11-14: 237 mL via ORAL

## 2017-11-13 MED ORDER — SODIUM CHLORIDE 0.9% FLUSH
3.0000 mL | Freq: Two times a day (BID) | INTRAVENOUS | Status: DC
  Administered 2017-11-14: 3 mL via INTRAVENOUS

## 2017-11-13 MED ORDER — ONDANSETRON HCL 4 MG/2ML IJ SOLN: 4.0000 mg | Freq: Four times a day (QID) | INTRAMUSCULAR | Status: DC | PRN

## 2017-11-13 MED ORDER — DEXTROSE 10 % IV SOLN
100.0000 mL | Freq: Once | INTRAVENOUS | Status: AC
  Administered 2017-11-13: 100 mL via INTRAVENOUS

## 2017-11-13 MED ORDER — ONDANSETRON HCL 4 MG/2ML IJ SOLN
4.0000 mg | Freq: Once | INTRAMUSCULAR | Status: AC
  Administered 2017-11-13: 4 mg via INTRAVENOUS
  Filled 2017-11-13: qty 2

## 2017-11-13 MED ORDER — DEXTROSE 50 % IV SOLN
INTRAVENOUS | Status: AC
  Administered 2017-11-13: 50 mL
  Filled 2017-11-13: qty 50

## 2017-11-13 MED ORDER — NICOTINE 21 MG/24HR TD PT24
21.0000 mg | MEDICATED_PATCH | Freq: Every day | TRANSDERMAL | Status: DC
  Administered 2017-11-14 – 2017-11-15 (×2): 21 mg via TRANSDERMAL
  Filled 2017-11-13 (×2): qty 1

## 2017-11-13 MED ORDER — SODIUM CHLORIDE 0.9% FLUSH: 3.0000 mL | INTRAVENOUS | Status: DC | PRN

## 2017-11-13 MED ORDER — ACETAMINOPHEN 650 MG RE SUPP: 650.0000 mg | Freq: Four times a day (QID) | RECTAL | Status: DC | PRN

## 2017-11-13 MED ORDER — DEXTROSE 50 % IV SOLN
1.0000 | INTRAVENOUS | Status: DC | PRN
  Administered 2017-11-13: 50 mL via INTRAVENOUS
  Filled 2017-11-13: qty 50

## 2017-11-13 MED ORDER — ONDANSETRON HCL 4 MG PO TABS: 4.0000 mg | ORAL_TABLET | Freq: Four times a day (QID) | ORAL | Status: DC | PRN

## 2017-11-13 MED ORDER — OCTREOTIDE LOAD VIA INFUSION
25.0000 ug | Freq: Once | INTRAVENOUS | Status: AC
  Administered 2017-11-13: 25 ug via INTRAVENOUS
  Filled 2017-11-13: qty 13

## 2017-11-13 MED ORDER — DEXTROSE 10 % IV SOLN
100.0000 mL | INTRAVENOUS | Status: DC
  Administered 2017-11-13: 100 mL via INTRAVENOUS

## 2017-11-13 MED ORDER — ACETAMINOPHEN 325 MG PO TABS: 650.0000 mg | ORAL_TABLET | Freq: Four times a day (QID) | ORAL | Status: DC | PRN

## 2017-11-13 MED ORDER — DEXTROSE 50 % IV SOLN
1.0000 | Freq: Once | INTRAVENOUS | Status: AC
  Administered 2017-11-13: 50 mL via INTRAVENOUS
  Filled 2017-11-13: qty 50

## 2017-11-13 MED ORDER — DEXTROSE 10 % IV SOLN
INTRAVENOUS | Status: DC
  Administered 2017-11-13 – 2017-11-14 (×2): via INTRAVENOUS

## 2017-11-13 NOTE — ED Provider Notes (Signed)
[facility] EMERGENCY DEPARTMENT Provider Note  CSN: [id number] Arrival date & time: 11/13/17 1254  Chief Complaint(s) Altered Mental Status  HPI John Cox is a 47 y.o. male here for altered mental status.  Patient states that it due to his sugars.  He is unable to provide specific details due to increased lethargy and confusion.  He was able to state that he called EMS 3-4 days ago for this who evaluated him and did not feel he needed to come into the hospital.  States that he felt like his sugars were low however last blood sugar was 300.  Patient states that he ate breakfast this morning.  Patient was seen in the first look process and found to have a CBG of 51.  Remainder of history, ROS, and physical exam limited due to patient's condition (AMS). Additional information was obtained from EMS.   Level V Caveat.    HPI  Past Medical History Past Medical History:  Diagnosis Date  . Depression 2000  . Diabetes mellitus 1995  . Nausea & vomiting   . Polysubstance abuse (HCC) 2012   relapse 2 wk ago   . Suicidal ideation    Patient Active Problem List   Diagnosis Date Noted  . Malnutrition of moderate degree 07/19/2015  . DKA, type 2, not at goal [facility]) 07/18/2015  . Hyperkalemia 07/18/2015  . AKI (acute kidney injury) (HCC) 07/18/2015  . Nausea vomiting and diarrhea 07/18/2015  . Marijuana abuse 07/18/2015  . Dehydration 07/18/2015  . Dental caries 07/18/2015  . Hyperlipidemia associated with type 2 diabetes mellitus (HCC) 05/24/2014  . Polysubstance abuse (HCC) 05/13/2014  . Major depression, recurrent (HCC) 05/13/2014  . Suicidal ideations 05/13/2014  . ANTICARDIOLIPIN ANTIBODY SYNDROME 05/12/2007  . DIABETES MELLITUS, TYPE I 04/26/2007  . TOBACCO ABUSE 04/26/2007  . 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 Authorizing Provider  cyclobenzaprine  (FLEXERIL) 10 MG tablet Take 1 tablet (10 mg total) by mouth 2 (two) times daily as needed for muscle spasms. 09/29/16   Dorena Bodo, NP  insulin NPH-regular Human (NOVOLIN 70/30) (70-30) 100 UNIT/ML injection Inject 25 Units into the skin 2 (two) times daily with a meal. 07/19/15   Hongalgi, Maximino Greenland, MD  Insulin Syringe-Needle U-100 (SAFETY-GLIDE 0.3CC SYR 29GX1/2) 29G X 1/2" 0.3 ML MISC Use as directed. 05/18/14   Hongalgi, Maximino Greenland, MD  meloxicam (MOBIC) 15 MG tablet Take 1 tablet (15 mg total) by mouth daily. 09/29/16   Dorena Bodo, NP  ondansetron (ZOFRAN-ODT) 4 MG disintegrating tablet Take 1 tablet (4 mg total) by mouth every 8 (eight) hours as needed for nausea or vomiting. 05/06/17   Rozann Lesches, MD  Past Surgical History History reviewed. No pertinent surgical history. Family History Family History  Problem Relation Age of Onset  . Diabetes Mother   . COPD Mother   . Heart disease Mother     Social History Social History   Tobacco Use  . Smoking status: Current Every Day Smoker    Packs/day: 0.00  . Smokeless tobacco: Never Used  Substance Use Topics  . Alcohol use: Yes  . Drug use: Yes    Types: Marijuana   Allergies Patient has no known allergies.  Review of Systems Review of Systems  Unable to perform ROS: Mental status change    Physical Exam Vital Signs  I have reviewed the triage vital signs BP 130/85 (BP Location: Right Arm)   Pulse 90   Temp 98.5 F (36.9 C) (Oral)   Resp 18   SpO2 100%   Physical Exam  Constitutional: He appears well-developed and well-nourished. He appears lethargic. No distress.  HENT:  Head: Normocephalic and atraumatic.  Nose: Nose normal.  Eyes: Conjunctivae and EOM are normal. Pupils are equal, round, and reactive to light. Right eye exhibits no discharge. Left eye exhibits no  discharge. No scleral icterus.  Neck: Normal range of motion. Neck supple.  Cardiovascular: Normal rate and regular rhythm. Exam reveals no gallop and no friction rub.  No murmur heard. Pulmonary/Chest: Effort normal and breath sounds normal. No stridor. No respiratory distress. He has no rales.  Abdominal: Soft. He exhibits no distension. There is no tenderness.  Musculoskeletal: He exhibits no edema or tenderness.  Neurological: He appears lethargic. He is disoriented (oriented to person and place.).  Skin: Skin is warm and dry. No rash noted. He is not diaphoretic. No erythema.  Psychiatric: He has a normal mood and affect.  Vitals reviewed.   ED Results and Treatments Labs (all labs ordered are listed, but only abnormal results are displayed) Labs Reviewed  COMPREHENSIVE METABOLIC PANEL - Abnormal; Notable for the following components:      Result Value   Glucose, Bld 55 (*)    BUN 30 (*)    Creatinine, Ser 1.57 (*)    Calcium 10.6 (*)    GFR calc non Af Amer 51 (*)    GFR calc Af Amer 59 (*)    All other components within normal limits  CBG MONITORING, ED - Abnormal; Notable for the following components:   Glucose-Capillary 51 (*)    All other components within normal limits  I-STAT CHEM 8, ED - Abnormal; Notable for the following components:   Sodium 146 (*)    BUN 33 (*)    Creatinine, Ser 1.60 (*)    Glucose, Bld 57 (*)    TCO2 33 (*)    Hemoglobin 18.0 (*)    HCT 53.0 (*)    All other components within normal limits  I-STAT CG4 LACTIC ACID, ED - Abnormal; Notable for the following components:   Lactic Acid, Venous 2.15 (*)    All other components within normal limits  CBG MONITORING, ED - Abnormal; Notable for the following components:   Glucose-Capillary 306 (*)    All other components within normal limits  CBG MONITORING, ED - Abnormal; Notable for the following components:   Glucose-Capillary 51 (*)    All other components within normal limits  I-STAT CHEM  8, ED - Abnormal; Notable for the following components:   Sodium 146 (*)    Potassium 3.4 (*)    BUN 33 (*)    Creatinine,  Ser 1.40 (*)    Glucose, Bld 35 (*)    Hemoglobin 17.3 (*)    All other components within normal limits  CBC WITH DIFFERENTIAL/PLATELET  ETHANOL  URINALYSIS, ROUTINE W REFLEX MICROSCOPIC  RAPID URINE DRUG SCREEN, HOSP PERFORMED  CBG MONITORING, ED  I-STAT TROPONIN, ED  CBG MONITORING, ED  I-STAT CG4 LACTIC ACID, ED  CBG MONITORING, ED  CBG MONITORING, ED  CBG MONITORING, ED  CBG MONITORING, ED                                                                                                                         EKG  EKG Interpretation  Date/Time:  Friday November 13 2017 14:04:13 EDT Ventricular Rate:  91 PR Interval:  138 QRS Duration: 96 QT Interval:  352 QTC Calculation: 432 R Axis:   80 Text Interpretation:  Normal sinus rhythm Incomplete right bundle branch block Moderate voltage criteria for LVH, may be normal variant Borderline ECG No significant change since last tracing Confirmed by Drema Pry [phone]) on 11/13/2017 4:18:44 PM      Radiology Dg Chest 2 View  Result Date: 11/13/2017 CLINICAL DATA:  Glycemia with cough and shortness of breath EXAM: CHEST - 2 VIEW COMPARISON:  July 18, 2015 FINDINGS: Lungs are clear. Heart size and pulmonary vascularity are normal. No adenopathy. No bone lesions. IMPRESSION: No edema or consolidation. Electronically Signed   By: Bretta Bang III M.D.   On: 11/13/2017 14:44   Ct Head Wo Contrast  Result Date: 11/13/2017 CLINICAL DATA:  Hypoglycemia and confusion EXAM: CT HEAD WITHOUT CONTRAST TECHNIQUE: Contiguous axial images were obtained from the base of the skull through the vertex without intravenous contrast. COMPARISON:  10/31/2006 FINDINGS: BRAIN: The ventricles and sulci are normal. No intraparenchymal hemorrhage, mass effect nor midline shift. No acute large vascular territory infarcts. Grey-white  matter distinction is maintained. The basal ganglia are unremarkable. No abnormal extra-axial fluid collections. Basal cisterns are not effaced and midline. The brainstem and cerebellar hemispheres are without acute abnormalities. VASCULAR: Mild-to-moderate atherosclerosis of the carotid siphons bilaterally. No hyperdense vessel sign. SKULL/SOFT TISSUES: No skull fracture. No significant soft tissue swelling. ORBITS/SINUSES: The included ocular globes and orbital contents are normal.The mastoid air cells are clear. The included paranasal sinuses are well-aerated. OTHER: None. IMPRESSION: No acute intracranial abnormality. 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 Electronically Signed   By: Tollie Eth M.D.   On: 11/13/2017 17:54   Pertinent labs & imaging results that were available during my care of the patient were reviewed by me and considered in my medical decision making (see chart for details).  Medications Ordered in ED Medications  sodium chloride flush (NS) 0.9 % injection 3 mL (not administered)  sodium chloride flush (NS) 0.9 % injection 3 mL (not administered)  0.9 %  sodium chloride infusion (not administered)  dextrose 50 % solution 50 mL (not administered)  octreotide (SANDOSTATIN) 2 mcg/mL load via infusion 25 mcg (not administered)  And  octreotide (SANDOSTATIN) 500 mcg in sodium chloride 0.9 % 250 mL (2 mcg/mL) infusion (not administered)  dextrose 50 % solution 50 mL (50 mLs Intravenous Given 11/13/17 1625)  ondansetron (ZOFRAN) injection 4 mg (4 mg Intravenous Given 11/13/17 1629)  dextrose 10 % infusion (100 mLs Intravenous New Bag/Given 11/13/17 1713)  dextrose 50 % solution (50 mLs  Given 11/13/17 1800)                                                                                                                                    Procedures Procedures CRITICAL CARE Performed by: Amadeo GarnetPedro Eduardo Darris Staiger Total critical care time: 45 minutes Critical care time was exclusive of  separately billable procedures and treating other patients. Critical care was necessary to treat or prevent imminent or life-threatening deterioration. Critical care was time spent personally by me on the following activities: development of treatment plan with patient and/or surrogate as well as nursing, discussions with consultants, evaluation of patient's response to treatment, examination of patient, obtaining history from patient or surrogate, ordering and performing treatments and interventions, ordering and review of laboratory studies, ordering and review of radiographic studies, pulse oximetry and re-evaluation of patient's condition.   (including critical care time)  Medical Decision Making / ED Course I have reviewed the nursing notes for this encounter and the patient's prior records (if available in EHR or on provided paperwork).    Patient noted to be hypoglycemic.  Home regimen includes Novolin.  No long-lasting insulin or oral medication.  Patient required multiple doses of D50 bolus.  Placed on D10 infusion and still required D50 bolus.  Patient placed on octreotide.  Labs revealed mild renal insufficiency.  Otherwise reassuring.  CT head unremarkable.  EKG without acute ischemic changes.  Troponin negative.  Chest x-ray negative.  Does not appear to be any infectious symptoms however will obtain urinalysis to rule out a urinary tract infection.  Patient requires admission for continued management and workup.  Final Clinical Impression(s) / ED Diagnoses Final diagnoses:  Hypoglycemia      This chart was dictated using voice recognition software.  Despite best efforts to proofread,  errors can occur which can change the documentation meaning.   Nira Connardama, Arianis Bowditch Eduardo, MD 11/13/17 [id number]

## 2017-11-13 NOTE — ED Triage Notes (Signed)
Pt states he is confused and feels weak and shaky like when his sugar is low. PT states he checked his sugar but does not know when and it was 300. He reports he had 2 cups of OJ today because he felt like it was low. PT poor historian and gets frustrated when asking for details on symptoms. PT reports this has been going on x 1 week and states he doesn't even know what day it is or what is going on.

## 2017-11-13 NOTE — ED Notes (Signed)
CBG 51 in triage. Pt provided with 2 cups of OJ. Abigail PA at bedside

## 2017-11-13 NOTE — H&P (Addendum)
John Cox ZOX:[id number] DOB: [birth year] DOA: 11/13/2017     PCP: Patient, No Pcp Per  Dr. Armen Pickup in the past Outpatient Specialists:     Patient coming from:    home Lives   With family      Chief Complaint  Patient presents with  . Altered Mental Status    HPI: John Cox is a 47 y.o. male with medical history significant of DM1. Tobacco abuse, depression, polysubstance abuse, HLD    Presented with   confusion feeling jittery like his blood sugar is low he has been drinking all G today but still has persistent sensation his blood sugar be very well with states he called EMS may be 3 days ago for the same complaints patient unable to provide detailed history secondary to confusion reports that his been having trouble his blood sugar being low for at least a week Last week he reports nausea and vomiting reports that he may have had flulike symptoms last week. He called EMS on arrival blood sugar 51 Reportedly at home patient takes NPH 70/30 25 units twice a day Reports being confused for the past few days. When asked when he administered insulin last states he is not sure what day is it. He took 35 units of NPH 70/30 last night. States he has been for the past few years.  Last admission was in 2016 for a mild DKA While in ER: Initially given D D50 with blood sugar continued to drop he was switched to D10 and then started on octreotide drip to maintain blood sugars Significant initial  Findings:  WBC  6.0 Sodium 146 creatinine 1.6 blood sugar initially 57  K 4.3 LA 2.15 Trop 0.02  Hemoglobin & Hematocrit     Component Value Date/Time   HGB 17.3 (H) 11/13/2017 1638   HCT 51.0 11/13/2017 1638     Lab Results  Component Value Date   CREATININE 1.40 (H) 11/13/2017   CREATININE 1.60 (H) 11/13/2017   CREATININE 1.57 (H) 11/13/2017      Chest x-ray showing nonacute CT head nonacute    IN ER:  Temp (24hrs), Avg:98.5 F (36.9 C), Min:98.5 F (36.9 C),  Max:98.5 F (36.9 C)      on arrival  ED Triage Vitals  Enc Vitals Group     BP 11/13/17 1403 130/85     Pulse Rate 11/13/17 1403 90     Resp 11/13/17 1403 18     Temp 11/13/17 1403 98.5 F (36.9 C)     Temp Source 11/13/17 1403 Oral     SpO2 11/13/17 1403 100 %     Weight --      Height --      Head Circumference --      Peak Flow --      Pain Score 11/13/17 1408 8     Pain Loc --      Pain Edu? --      Excl. in GC? --      Latest   Blood pressure (!) 151/88, pulse 87, temperature 98.5 F (36.9 C), temperature source Oral, resp. rate 19, SpO2 100 %.   Following Medications were ordered in ER: Medications  sodium chloride flush (NS) 0.9 % injection 3 mL (not administered)  sodium chloride flush (NS) 0.9 % injection 3 mL (not administered)  0.9 %  sodium chloride infusion (not administered)  dextrose 50 % solution 50 mL (not administered)  octreotide (SANDOSTATIN) 2 mcg/mL load via infusion  25 mcg (25 mcg Intravenous Bolus from Bag 11/13/17 1832)    And  octreotide (SANDOSTATIN) 500 mcg in sodium chloride 0.9 % 250 mL (2 mcg/mL) infusion (50 mcg/hr Intravenous New Bag/Given 11/13/17 1834)  dextrose 50 % solution 50 mL (50 mLs Intravenous Given 11/13/17 1625)  ondansetron (ZOFRAN) injection 4 mg (4 mg Intravenous Given 11/13/17 1629)  dextrose 10 % infusion (100 mLs Intravenous New Bag/Given 11/13/17 1713)  dextrose 50 % solution (50 mLs  Given 11/13/17 1800)      Hospitalist was called for admission for symptomatic hypoglycemia requiring octreotide drip     Review of Systems:    Pertinent positives include:  abdominal pain, nausea, vomiting  Constitutional:  No weight loss, night sweats, Fevers, chills, fatigue, weight loss  HEENT:  No headaches, Difficulty swallowing,Tooth/dental problems,Sore throat,  No sneezing, itching, ear ache, nasal congestion, post nasal drip,  Cardio-vascular:  No chest pain, Orthopnea, PND, anasarca, dizziness, palpitations.no Bilateral  lower extremity swelling  GI:  No heartburn, indigestion,, diarrhea, change in bowel habits, loss of appetite, melena, blood in stool, hematemesis Resp:  no shortness of breath at rest. No dyspnea on exertion, No excess mucus, no productive cough, No non-productive cough, No coughing up of blood.No change in color of mucus.No wheezing. Skin:  no rash or lesions. No jaundice GU:  no dysuria, change in color of urine, no urgency or frequency. No straining to urinate.  No flank pain.  Musculoskeletal:  No joint pain or no joint swelling. No decreased range of motion. No back pain.  Psych:  No change in mood or affect. No depression or anxiety. No memory loss.  Neuro: no localizing neurological complaints, no tingling, no weakness, no double vision, no gait abnormality, no slurred speech, no confusion  As per HPI otherwise 10 point review of systems negative.   Past Medical History: Past Medical History:  Diagnosis Date  . Depression 2000  . Diabetes mellitus 1995  . Nausea & vomiting   . Polysubstance abuse (HCC) 2012   relapse 2 wk ago   . Suicidal ideation    History reviewed. No pertinent surgical history.   Social History:  Ambulatory  Independently     reports that he has been smoking.  He has been smoking about 0.00 packs per day. he has never used smokeless tobacco. He reports that he drinks alcohol. He reports that he uses drugs. Drug: Marijuana.  Allergies:  No Known Allergies     Family History:   Family History  Problem Relation Age of Onset  . Diabetes Mother   . COPD Mother   . Heart disease Mother     Medications: Prior to Admission medications   Medication Sig Start Date End Date Taking? Authorizing Provider  cyclobenzaprine (FLEXERIL) 10 MG tablet Take 1 tablet (10 mg total) by mouth 2 (two) times daily as needed for muscle spasms. 09/29/16  Yes Dorena BodoKennard, Lawrence, NP  insulin NPH-regular Human (NOVOLIN 70/30) (70-30) 100 UNIT/ML injection Inject 25  Units into the skin 2 (two) times daily with a meal. 07/19/15  Yes Hongalgi, Maximino GreenlandAnand D, MD  meloxicam (MOBIC) 15 MG tablet Take 1 tablet (15 mg total) by mouth daily. 09/29/16  Yes Dorena BodoKennard, Lawrence, NP  Insulin Syringe-Needle U-100 (SAFETY-GLIDE 0.3CC SYR 29GX1/2) 29G X 1/2" 0.3 ML MISC Use as directed. 05/18/14   Elease EtienneHongalgi, Anand D, MD    Physical Exam: Patient Vitals for the past 24 hrs:  BP Temp Temp src Pulse Resp SpO2  11/13/17 1815 (!) 151/88 - -  87 19 100 %  11/13/17 1800 (!) 136/102 - - 81 16 96 %  11/13/17 1745 (!) 130/92 - - 81 17 97 %  11/13/17 1730 122/83 - - 82 14 97 %  11/13/17 1715 133/84 - - 80 17 98 %  11/13/17 1700 126/86 - - 87 17 97 %  11/13/17 1645 125/90 - - 83 12 98 %  11/13/17 1630 (!) 141/81 - - 81 17 95 %  11/13/17 1615 (!) 145/87 - - 91 (!) 22 94 %  11/13/17 1403 130/85 98.5 F (36.9 C) Oral 90 18 100 %    1. General:  in No Acute distress   Chronically ill -appearing 2. Psychological:somnolent and  Oriented to situation not place, month, year 3. Head/ENT:    Dry Mucous Membranes                          Head Non traumatic, neck supple                           Poor Dentition 4. SKIN:  decreased Skin turgor,  Skin clean Dry and intact no rash 5. Heart: Regular rate and rhythm no  Murmur, no Rub or gallop 6. Lungs: Clear to auscultation bilaterally, no wheezes or crackles   7. Abdomen: Soft, non-tender, Non distended  bowel sounds present 8. Lower extremities: no clubbing, cyanosis, or edema 9. Neurologically Grossly intact, moving all 4 extremities equally  10. MSK: Normal range of motion   body mass index is unknown because there is no height or weight on file.  Labs on Admission:   Labs on Admission: I have personally reviewed following labs and imaging studies  CBC: Recent Labs  Lab 11/13/17 1413 11/13/17 1450 11/13/17 1638  WBC 6.0  --   --   NEUTROABS 4.3  --   --   HGB 16.6 18.0* 17.3*  HCT 49.1 53.0* 51.0  MCV 85.7  --   --   PLT 278   --   --    Basic Metabolic Panel: Recent Labs  Lab 11/13/17 1413 11/13/17 1450 11/13/17 1638  NA 145 146* 146*  K 4.3 4.1 3.4*  CL 102 101 105  CO2 30  --   --   GLUCOSE 55* 57* 35*  BUN 30* 33* 33*  CREATININE 1.57* 1.60* 1.40*  CALCIUM 10.6*  --   --    GFR: CrCl cannot be calculated (Unknown ideal weight.). Liver Function Tests: Recent Labs  Lab 11/13/17 1413  AST 19  ALT 28  ALKPHOS 95  BILITOT 1.0  PROT 8.0  ALBUMIN 4.8   No results for input(s): LIPASE, AMYLASE in the last 168 hours. No results for input(s): AMMONIA in the last 168 hours. Coagulation Profile: No results for input(s): INR, PROTIME in the last 168 hours. Cardiac Enzymes: No results for input(s): CKTOTAL, CKMB, CKMBINDEX, TROPONINI in the last 168 hours. BNP (last 3 results) No results for input(s): PROBNP in the last 8760 hours. HbA1C: No results for input(s): HGBA1C in the last 72 hours. CBG: Recent Labs  Lab 11/13/17 1508 11/13/17 1626 11/13/17 1701 11/13/17 1758 11/13/17 1815  GLUCAP 70 306* 77 51* 147*   Lipid Profile: No results for input(s): CHOL, HDL, LDLCALC, TRIG, CHOLHDL, LDLDIRECT in the last 72 hours. Thyroid Function Tests: No results for input(s): TSH, T4TOTAL, FREET4, T3FREE, THYROIDAB in the last 72 hours. Anemia Panel: No results for input(s):  VITAMINB12, FOLATE, FERRITIN, TIBC, IRON, RETICCTPCT in the last 72 hours. Urine analysis:  Sepsis Labs: @LABRCNTIP (procalcitonin:4,lacticidven:4) )No results found for this or any previous visit (from the past 240 hour(s)).    UA ordered  Lab Results  Component Value Date   HGBA1C 11.0 (H) 07/19/2015    CrCl cannot be calculated (Unknown ideal weight.).  BNP (last 3 results) No results for input(s): PROBNP in the last 8760 hours.   ECG REPORT  Independently reviewed Rate:91  Rhythm: Complete right bundle branch block ST&T Change:  peak T waves QTC 432  There were no vitals filed for this  visit.   Cultures: No results found for: SDES, SPECREQUEST, CULT, REPTSTATUS   Radiological Exams on Admission: Dg Chest 2 View  Result Date: 11/13/2017 CLINICAL DATA:  Glycemia with cough and shortness of breath EXAM: CHEST - 2 VIEW COMPARISON:  July 18, 2015 FINDINGS: Lungs are clear. Heart size and pulmonary vascularity are normal. No adenopathy. No bone lesions. IMPRESSION: No edema or consolidation. Electronically Signed   By: Bretta Bang III M.D.   On: 11/13/2017 14:44   Ct Head Wo Contrast  Result Date: 11/13/2017 CLINICAL DATA:  Hypoglycemia and confusion EXAM: CT HEAD WITHOUT CONTRAST TECHNIQUE: Contiguous axial images were obtained from the base of the skull through the vertex without intravenous contrast. COMPARISON:  10/31/2006 FINDINGS: BRAIN: The ventricles and sulci are normal. No intraparenchymal hemorrhage, mass effect nor midline shift. No acute large vascular territory infarcts. Grey-white matter distinction is maintained. The basal ganglia are unremarkable. No abnormal extra-axial fluid collections. Basal cisterns are not effaced and midline. The brainstem and cerebellar hemispheres are without acute abnormalities. VASCULAR: Mild-to-moderate atherosclerosis of the carotid siphons bilaterally. No hyperdense vessel sign. SKULL/SOFT TISSUES: No skull fracture. No significant soft tissue swelling. ORBITS/SINUSES: The included ocular globes and orbital contents are normal.The mastoid air cells are clear. The included paranasal sinuses are well-aerated. OTHER: None. IMPRESSION: No acute intracranial abnormality. 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 Electronically Signed   By: Tollie Eth M.D.   On: 11/13/2017 17:54    Chart has been reviewed    Assessment/Plan  48 y.o. male with medical history significant of DM1. Tobacco abuse, depression, polysubstance abuse, HLD Admitted for persistent hypoglycemia  Present on Admission: . Hypoglycemia due to insulin  -most likely  suspect accidental overuse of insulin versus poor insulin clearance secondary to worsening renal function. Will admit for hypoglycemia monitor blood sugar carefully continue D10 unsure if with associated benefit from octreotide or not.  Patient adamantly denies suicidal ideations or attempts to hurt himself with insulin.  Given history of diabetes type 1 at some point insulin will need to be restarted once hypoglycemia have stabilized to avoid DKA perhaps would benefit from initiation of glucose stabilizer until blood sugars are stable. Will check insulin, C-peptide and proinsulin levels Strongly benefit from close follow-up with endocrinology and diabetes coordinator consult . Polysubstance abuse (HCC) . Malnutrition of moderate degree nutritional consult check prealbumin . Major depression, recurrent (HCC) denies current suicidal ideations . Type 1 diabetes mellitus (HCC) -given recurrent severe hypoglycemia of asymptomatic presentation for now hold NPH but will need to restart insulin once hypoglycemia have resolved.  Would benefit from glucose stabilizer at first until blood sugars are stable . AKI (acute kidney injury) (HCC) rehydrate and recheck check urine electrolytes Likely dehydrated with recent possible viral illness will check influenza panel   . TOBACCO ABUSE spoke about quitting nicotine patch ordered  Other plan as per orders.  DVT  prophylaxis:  SCD   Code Status:  FULL CODE     Family Communication:   Family not  at  Bedside    Disposition Plan:   To home once workup is complete and patient is stable                        Diabetes coordinator      consulted                          Consults called: none   Admission status:   inpatient      Level of care     SDU      I have spent a total of on this admission    Versa Craton 11/13/2017, 8:36 PM  [facility]  Pager [phone]   after 2 AM please page floor coverage PA If 7AM-7PM, please contact  the day team taking care of the patient  [email or web address]  Password [email or web address]

## 2017-11-13 NOTE — ED Notes (Signed)
MD aware of low CBG. Ordered amp of D50 and incr of D10 infusion to 13250mL/hr.

## 2017-11-13 NOTE — ED Notes (Signed)
CBG was 31. Marquita PalmsMario, RN notified.

## 2017-11-13 NOTE — ED Provider Notes (Signed)
Patient placed in Quick Look pathway, seen and evaluated   Chief Complaint: altered mental status  HPI:   Patient here for altered mental status.  He is an insulin-dependent diabetic.  He has had flulike symptoms last week including nausea vomiting.  States that he is still confused and unable to provide further history there is a level 5 caveat therefore.  Patient states that his last blood sugar was 300.  Upon arrival it is found to be at 51.  Patient states he did eat to chicken biscuits this morning.  ROS: Confusion abdominal pain and hypoglycemia (one)  Physical Exam:   Gen: No distress  Neuro: Awake and Alert  Skin: Warm    Focused Exam: Abdomen is tender to palpation.  Patient oriented to self and year.  Unable to tell me the day of the week or what hospital he is at   Initiation of care has begun. The patient has been counseled on the process, plan, and necessity for staying for the completion/evaluation, and the remainder of the medical screening examination    Arthor CaptainHarris, Jackson Coffield, PA-C 11/13/17 1424    Rolland PorterJames, Mark, MD 11/15/17 [phone]

## 2017-11-14 ENCOUNTER — Other Ambulatory Visit: Payer: Self-pay

## 2017-11-14 DIAGNOSIS — T383X5A Adverse effect of insulin and oral hypoglycemic [antidiabetic] drugs, initial encounter: Secondary | ICD-10-CM

## 2017-11-14 DIAGNOSIS — E160 Drug-induced hypoglycemia without coma: Secondary | ICD-10-CM

## 2017-11-14 LAB — BASIC METABOLIC PANEL
Anion gap: 6 (ref 5–15)
Anion gap: 11 (ref 5–15)
BUN: 22 mg/dL — AB (ref 6–20)
BUN: 24 mg/dL — AB (ref 6–20)
CALCIUM: 8.6 mg/dL — AB (ref 8.9–10.3)
CHLORIDE: 94 mmol/L — AB (ref 101–111)
CO2: 28 mmol/L (ref 22–32)
CO2: 27 mmol/L (ref 22–32)
CREATININE: 1.66 mg/dL — AB (ref 0.61–1.24)
Calcium: 8.9 mg/dL (ref 8.9–10.3)
Chloride: 92 mmol/L — ABNORMAL LOW (ref 101–111)
Creatinine, Ser: 1.6 mg/dL — ABNORMAL HIGH (ref 0.61–1.24)
GFR calc Af Amer: 58 mL/min — ABNORMAL LOW (ref >60)
GFR calc non Af Amer: 48 mL/min — ABNORMAL LOW (ref >60)
GFR calc non Af Amer: 50 mL/min — ABNORMAL LOW (ref >60)
GFR, EST AFRICAN AMERICAN: 55 mL/min — AB (ref >60)
GLUCOSE: 610 mg/dL — AB (ref 65–99)
GLUCOSE: 420 mg/dL — AB (ref 65–99)
POTASSIUM: 4.2 mmol/L (ref 3.5–5.1)
Potassium: 6.9 mmol/L (ref 3.5–5.1)
Sodium: 126 mmol/L — ABNORMAL LOW (ref 135–145)
Sodium: 132 mmol/L — ABNORMAL LOW (ref 135–145)

## 2017-11-14 LAB — MAGNESIUM: MAGNESIUM: 2.3 mg/dL (ref 1.7–2.4)

## 2017-11-14 LAB — URINALYSIS, ROUTINE W REFLEX MICROSCOPIC
BACTERIA UA: NONE SEEN
BILIRUBIN URINE: NEGATIVE
Glucose, UA: >=500 mg/dL — AB
HGB URINE DIPSTICK: NEGATIVE
Ketones, ur: NEGATIVE mg/dL
LEUKOCYTES UA: NEGATIVE
NITRITE: NEGATIVE
PROTEIN: 100 mg/dL — AB
Specific Gravity, Urine: 1.025 (ref 1.005–1.030)
pH: 6 (ref 5.0–8.0)

## 2017-11-14 LAB — TSH: TSH: 0.15 u[IU]/mL — ABNORMAL LOW (ref 0.350–4.500)

## 2017-11-14 LAB — GLUCOSE, CAPILLARY
GLUCOSE-CAPILLARY: 255 mg/dL — AB (ref 65–99)
GLUCOSE-CAPILLARY: 117 mg/dL — AB (ref 65–99)
GLUCOSE-CAPILLARY: 173 mg/dL — AB (ref 65–99)
GLUCOSE-CAPILLARY: 186 mg/dL — AB (ref 65–99)
Glucose-Capillary: 205 mg/dL — ABNORMAL HIGH (ref 65–99)
Glucose-Capillary: >600 mg/dL (ref 65–99)
Glucose-Capillary: 556 mg/dL (ref 65–99)

## 2017-11-14 LAB — PREALBUMIN: PREALBUMIN: 24.7 mg/dL (ref 18–38)

## 2017-11-14 LAB — HIV ANTIBODY (ROUTINE TESTING W REFLEX): HIV Screen 4th Generation wRfx: NONREACTIVE

## 2017-11-14 LAB — COMPREHENSIVE METABOLIC PANEL
ALK PHOS: 72 U/L (ref 38–126)
ALT: 22 U/L (ref 17–63)
ANION GAP: 10 (ref 5–15)
AST: 24 U/L (ref 15–41)
Albumin: 3.7 g/dL (ref 3.5–5.0)
BILIRUBIN TOTAL: 1.6 mg/dL — AB (ref 0.3–1.2)
BUN: 21 mg/dL — AB (ref 6–20)
CALCIUM: 9.1 mg/dL (ref 8.9–10.3)
CO2: 29 mmol/L (ref 22–32)
Chloride: 100 mmol/L — ABNORMAL LOW (ref 101–111)
Creatinine, Ser: 1.39 mg/dL — ABNORMAL HIGH (ref 0.61–1.24)
GFR calc Af Amer: >60 mL/min (ref >60)
GFR, EST NON AFRICAN AMERICAN: 59 mL/min — AB (ref >60)
GLUCOSE: 235 mg/dL — AB (ref 65–99)
POTASSIUM: 4.1 mmol/L (ref 3.5–5.1)
Sodium: 139 mmol/L (ref 135–145)
TOTAL PROTEIN: 6.3 g/dL — AB (ref 6.5–8.1)

## 2017-11-14 LAB — CBC
HEMATOCRIT: 44.4 % (ref 39.0–52.0)
Hemoglobin: 14.2 g/dL (ref 13.0–17.0)
MCH: 27.8 pg (ref 26.0–34.0)
MCHC: 32 g/dL (ref 30.0–36.0)
MCV: 86.9 fL (ref 78.0–100.0)
Platelets: 228 10*3/uL (ref 150–400)
RBC: 5.11 MIL/uL (ref 4.22–5.81)
RDW: 14.3 % (ref 11.5–15.5)
WBC: 4.3 10*3/uL (ref 4.0–10.5)

## 2017-11-14 LAB — RAPID URINE DRUG SCREEN, HOSP PERFORMED
AMPHETAMINES: NOT DETECTED
BARBITURATES: NOT DETECTED
Benzodiazepines: NOT DETECTED
Cocaine: POSITIVE — AB
Opiates: NOT DETECTED
Tetrahydrocannabinol: POSITIVE — AB

## 2017-11-14 LAB — HEMOGLOBIN A1C
Hgb A1c MFr Bld: 10.5 % — ABNORMAL HIGH (ref 4.8–5.6)
MEAN PLASMA GLUCOSE: 254.65 mg/dL

## 2017-11-14 LAB — SODIUM, URINE, RANDOM: SODIUM UR: 45 mmol/L

## 2017-11-14 LAB — PHOSPHORUS: PHOSPHORUS: 4.1 mg/dL (ref 2.5–4.6)

## 2017-11-14 LAB — CREATININE, URINE, RANDOM: CREATININE, URINE: 253.19 mg/dL

## 2017-11-14 MED ORDER — SODIUM CHLORIDE 0.9 % IV SOLN
INTRAVENOUS | Status: DC
  Administered 2017-11-14: 19:00:00 via INTRAVENOUS

## 2017-11-14 MED ORDER — INSULIN ASPART 100 UNIT/ML ~~LOC~~ SOLN
0.0000 [IU] | SUBCUTANEOUS | Status: DC
  Administered 2017-11-14: 2 [IU] via SUBCUTANEOUS
  Administered 2017-11-14 (×2): 3 [IU] via SUBCUTANEOUS

## 2017-11-14 MED ORDER — PREMIER PROTEIN SHAKE
11.0000 [oz_av] | Freq: Two times a day (BID) | ORAL | Status: DC
  Administered 2017-11-14 – 2017-11-15 (×2): 11 [oz_av] via ORAL
  Filled 2017-11-14 (×5): qty 325.31

## 2017-11-14 MED ORDER — INSULIN NPH (HUMAN) (ISOPHANE) 100 UNIT/ML ~~LOC~~ SUSP
12.0000 [IU] | Freq: Two times a day (BID) | SUBCUTANEOUS | Status: DC
  Administered 2017-11-14: 12 [IU] via SUBCUTANEOUS
  Filled 2017-11-14: qty 10

## 2017-11-14 MED ORDER — INSULIN NPH ISOPHANE & REGULAR (70-30) 100 UNIT/ML ~~LOC~~ SUSP: 12.0000 [IU] | Freq: Two times a day (BID) | SUBCUTANEOUS | 0 refills | Status: DC

## 2017-11-14 MED ORDER — INSULIN NPH (HUMAN) (ISOPHANE) 100 UNIT/ML ~~LOC~~ SUSP
12.0000 [IU] | Freq: Two times a day (BID) | SUBCUTANEOUS | Status: DC
  Filled 2017-11-14 (×2): qty 10

## 2017-11-14 MED ORDER — ENSURE ENLIVE PO LIQD: 237.0000 mL | Freq: Two times a day (BID) | ORAL | 12 refills | Status: DC

## 2017-11-14 MED ORDER — INSULIN ASPART 100 UNIT/ML ~~LOC~~ SOLN
0.0000 [IU] | Freq: Three times a day (TID) | SUBCUTANEOUS | Status: DC
  Administered 2017-11-14: 15 [IU] via SUBCUTANEOUS

## 2017-11-14 MED ORDER — INSULIN NPH (HUMAN) (ISOPHANE) 100 UNIT/ML ~~LOC~~ SUSP
12.0000 [IU] | Freq: Two times a day (BID) | SUBCUTANEOUS | Status: DC
  Filled 2017-11-14: qty 10

## 2017-11-14 MED ORDER — SODIUM CHLORIDE 0.9 % IV BOLUS (SEPSIS)
1000.0000 mL | Freq: Once | INTRAVENOUS | Status: AC
  Administered 2017-11-14: 1000 mL via INTRAVENOUS

## 2017-11-14 MED ORDER — INSULIN REGULAR HUMAN 100 UNIT/ML IJ SOLN: INTRAMUSCULAR | 0 refills | Status: DC

## 2017-11-14 MED ORDER — NICOTINE 21 MG/24HR TD PT24: 21.0000 mg | MEDICATED_PATCH | Freq: Every day | TRANSDERMAL | 0 refills | Status: DC

## 2017-11-14 MED ORDER — INSULIN ASPART 100 UNIT/ML ~~LOC~~ SOLN: 0.0000 [IU] | Freq: Every day | SUBCUTANEOUS | Status: DC

## 2017-11-14 MED ORDER — INSULIN ASPART 100 UNIT/ML ~~LOC~~ SOLN
3.0000 [IU] | Freq: Once | SUBCUTANEOUS | Status: AC
  Administered 2017-11-14: 3 [IU] via SUBCUTANEOUS

## 2017-11-14 MED ORDER — INSULIN ASPART 100 UNIT/ML ~~LOC~~ SOLN: 3.0000 [IU] | Freq: Three times a day (TID) | SUBCUTANEOUS | Status: DC

## 2017-11-14 NOTE — Progress Notes (Signed)
Initial Nutrition Assessment  DOCUMENTATION CODES:   Not applicable  INTERVENTION:   Premier Protein BID, each supplement provides 160 kcal and 30 grams of protein.   NUTRITION DIAGNOSIS:   Inadequate oral intake related to acute illness as evidenced by meal completion < 50%.  GOAL:   Patient will meet greater than or equal to 90% of their needs  MONITOR:   PO intake, Supplement acceptance, Labs, Weight trends, I & O's  REASON FOR ASSESSMENT:   Consult Assessment of nutrition requirement/status  ASSESSMENT:   47 y.o. male with medical history significant of DM1. Tobacco abuse, depression, polysubstance abuse, HLD Admitted for persistent hypoglycemia  Met with pt in room today. Pt reports good appetite and oral intake at baseline. Pt reports that he wasn't eating well yesterday r/t hypoglycemia but reports that he ate about 50% of his breakfast this morning. Pt did drink a Glucerna today but would like to switch to [employer]. Pt reports his UBW is around 178lbs. Pt 164lbs on admission; suspect wt loss r/t fluid changes. Pt to possibly discharge later today. RD offered diabetes education to pt but he reports that he has been a diabetic for 20rs and he knows what to do.    Medications reviewed and include: insulin, nicotine  Labs reviewed: Cl 100(L), BUN 21(H), creat 1.39(H), P 4.1 wnl, Mg 2.3 wnl cbgs- 55, 57, 35, 235 x 24 hrs AIC 10.5(H)- 3/16  Nutrition-Focused physical exam completed. Findings are no fat depletion, moderate muscle depletions in BLE, and no edema.   Diet Order:  Diet Carb Modified Fluid consistency: Thin; Room service appropriate? Yes Diet - low sodium heart healthy  EDUCATION NEEDS:   Education needs have been addressed  Skin:  Reviewed RN Assessment  Last BM:  PTA  Height:   Ht Readings from Last 1 Encounters:  11/13/17 5' 11"  (1.803 m)    Weight:   Wt Readings from Last 1 Encounters:  11/13/17 164 lb 3.9 oz (74.5 kg)    Ideal  Body Weight:  78.2 kg  BMI:  Body mass index is 22.91 kg/m.  Estimated Nutritional Needs:   Kcal:  1900-2200kcal/day   Protein:  75-89g/day   Fluid:  >2.2L/day   Koleen Distance MS, RD, LDN Pager #[phone] After Hours Pager: [phone]

## 2017-11-14 NOTE — Evaluation (Signed)
Physical Therapy Evaluation Patient Details Name: John LernerWilliam B Silvio MRN: [id number] DOB: 29-Sep-1970 Today's Date: 11/14/2017   History of Present Illness  Patient is a 47 y/o male who presents with AMS and weakness. Found to have hypoglycemia. PMH includes polysubstance abuse, DM, depression.  Clinical Impression  Patient tolerated transfers, gait training and stair training with only mild drifting and imbalance most likely due to being in bed but no overt LOB noted. Not sure of pt's cognitive baseline but pt does not recall how he got to the hospital and was only able to state it was March due to March madness basketball being on per his report. 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 Seems to be functioning close to baseline per his report. Does not require skilled therapy services. All education completed. Discharge from therapy.    Follow Up Recommendations No PT follow up    Equipment Recommendations  None recommended by PT    Recommendations for Other Services       Precautions / Restrictions Precautions Precautions: None Restrictions Weight Bearing Restrictions: No      Mobility  Bed Mobility Overal bed mobility: Modified Independent             General bed mobility comments: No assist needed. No dizziness.  Transfers Overall transfer level: Modified independent               General transfer comment: Stood from EOB without difficulty. Stretching next to bed reaching for toes without difficulty.   Ambulation/Gait Ambulation/Gait assistance: Supervision Ambulation Distance (Feet): 300 Feet Assistive device: None Gait Pattern/deviations: Step-through pattern;Decreased stride length;Drifts right/left   Gait velocity interpretation: Below normal speed for age/gender General Gait Details: Slow, mostly steady gait with some mild drifting right/left but no overt LOB. VSS.  Stairs Stairs: Yes Stairs assistance: Supervision Stair Management: Alternating  pattern;Step to pattern;One rail Right Number of Stairs: 13 General stair comments: Alternating to ascend steps and step to pattern to descend steps.  Wheelchair Mobility    Modified Rankin (Stroke Patients Only)       Balance Overall balance assessment: Needs assistance Sitting-balance support: Feet supported;No upper extremity supported Sitting balance-Leahy Scale: Good     Standing balance support: During functional activity Standing balance-Leahy Scale: Good Standing balance comment: Able to reach down to touch toes without difficulty or LOB.                             Pertinent Vitals/Pain Pain Assessment: No/denies pain    Home Living Family/patient expects to be discharged to:: Private residence Living Arrangements: Parent Available Help at Discharge: Family;Available 24 hours/day Type of Home: House Home Access: Stairs to enter Entrance Stairs-Rails: Right Entrance Stairs-Number of Steps: 6-7 Home Layout: One level Home Equipment: None      Prior Function Level of Independence: Independent         Comments: Drives, Works 2 jobs- one is at [employer] 3rd shift. Helps take care of 48 y/o mother.     Hand Dominance        Extremity/Trunk Assessment   Upper Extremity Assessment Upper Extremity Assessment: Defer to OT evaluation    Lower Extremity Assessment Lower Extremity Assessment: Overall WFL for tasks assessed    Cervical / Trunk Assessment Cervical / Trunk Assessment: Normal  Communication   Communication: No difficulties  Cognition Arousal/Alertness: Awake/alert Behavior During Therapy: WFL for tasks assessed/performed Overall Cognitive Status: No family/caregiver present to determine  baseline cognitive functioning                                 General Comments: No recollection of how he got to the ED. Only knows it is March due to March madness basketball being on.       General Comments       Exercises     Assessment/Plan    PT Assessment Patent does not need any further PT services  PT Problem List         PT Treatment Interventions      PT Goals (Current goals can be found in the Care Plan section)  Acute Rehab PT Goals Patient Stated Goal: to get home and go to work tonight PT Goal Formulation: All assessment and education complete, DC therapy    Frequency     Barriers to discharge        Co-evaluation               AM-PAC PT "6 Clicks" Daily Activity  Outcome Measure Difficulty turning over in bed (including adjusting bedclothes, sheets and blankets)?: None Difficulty moving from lying on back to sitting on the side of the bed? : None Difficulty sitting down on and standing up from a chair with arms (e.g., wheelchair, bedside commode, etc,.)?: None Help needed moving to and from a bed to chair (including a wheelchair)?: None Help needed walking in hospital room?: None Help needed climbing 3-5 steps with a railing? : A Little 6 Click Score: 23    End of Session Equipment Utilized During Treatment: Gait belt Activity Tolerance: Patient tolerated treatment well Patient left: in bed;with call bell/phone within reach Nurse Communication: Mobility status PT Visit Diagnosis: Muscle weakness (generalized) (M62.81);Difficulty in walking, not elsewhere classified (R26.2)    Time: 1610-9604 PT Time Calculation (min) (ACUTE ONLY): 30 min   Charges:   PT Evaluation $PT Eval Low Complexity: 1 Low PT Treatments $Gait Training: 8-22 mins   PT G Codes:        Mylo Red, PT, DPT [phone]    Blake Divine A Tyrese Capriotti 11/14/2017, 12:26 PM

## 2017-11-14 NOTE — Progress Notes (Signed)
CSW acknowledging consult for patient with "no PCP and no insurance". RNCM will follow up with patient to discuss finding a PCP and any appropriate charity services that may be available to assist the patient with discharge needs. Financial counseling will also automatically reach out to the patient to discuss possibility of patient being appropriate for Medicaid, or to discuss hospital billing needs with the patient.  No CSW needs at this time. Please consult if CSW need arises in the future.  Blenda NicelyElizabeth Little [place], [place] Clinical Social Worker [phone]

## 2017-11-14 NOTE — Progress Notes (Signed)
[facility] Progress Note  Patient: John Cox ZOX:[id number]   PCP: Patient, No Pcp Per DOB: 1971-04-13   DOA: 11/13/2017   DOS: 11/14/2017   Date of Service: the patient was seen and examined on 11/14/2017  Subjective: Feeling better, no nausea no vomiting.  Brief hospital course: Pt. with PMH of type I DM; admitted on 11/13/2017, presented with complaint of jitteriness and fatigue, was found to have hypoglycemia. Currently further plan is correction of uncontrolled diabetes.  Assessment and Plan: 1.  Type 1 insulin-dependent uncontrolled diabetes. Hypoglycemia as well as hyperglycemia. Initially presented with hypoglycemia patient was started on D10 infusion as well as octreotide infusion. On stopping the infusion and allowing the patient to eat patient's sugar increased. At present we will put the patient on moderate sensitivity sliding scale, 12 units of NPH pre-meal coverage. Monitor overnight for adequate control.  2.  Polysubstance abuse. UDS is positive for cocaine and marijuana. Patient was counseled to stop using this medication.  3.  Mildly KI. Patient was given IV hydration renal function back to normal.  4.  Active smoker. Nicotine patch.  Diet: Carb modified diet DVT Prophylaxis: subcutaneous Heparin  Advance goals of care discussion: full code  Family Communication: no family was present at bedside, at the time of interview.  Disposition:  Discharge to home tomorrow.  Consultants: none Procedures: none  Antibiotics: Anti-infectives (From admission, onward)   None       Objective: Physical Exam: Vitals:   11/14/17 0441 11/14/17 0940 11/14/17 1209 11/14/17 1542  BP: 115/73 (!) 97/52 112/66 (!) 106/53  Pulse: 66 71 67 96  Resp: 18 18 16 19   Temp: 98 F (36.7 C) 98.5 F (36.9 C) 97.8 F (36.6 C) 98.3 F (36.8 C)  TempSrc: Oral Oral Oral Oral  SpO2: 97% 97% 98% 99%  Weight:      Height:        Intake/Output Summary (Last 24 hours) at  11/14/2017 1608 Last data filed at 11/14/2017 1549 Gross per 24 hour  Intake 2399.16 ml  Output 1256 ml  Net 1143.16 ml   Filed Weights   11/13/17 2144  Weight: 74.5 kg (164 lb 3.9 oz)   General: Alert, Awake and Oriented to Time, Place and Person. Appear in mild distress, affect appropriate Eyes: PERRL, Conjunctiva normal ENT: Oral Mucosa clear moist. Neck: no JVD, no Abnormal Mass Or lumps Cardiovascular: S1 and S2 Present, no Murmur, Peripheral Pulses Present Respiratory: normal respiratory effort, Bilateral Air entry equal and Decreased, no use of accessory muscle, Clear to Auscultation, no Crackles, no wheezes Abdomen: Bowel Sound present, Soft and no tenderness, no hernia Skin: no redness, no Rash, no induration Extremities: no Pedal edema, no calf tenderness Neurologic: Grossly no focal neuro deficit. Bilaterally Equal motor strength  Data Reviewed: CBC: Recent Labs  Lab 11/13/17 1413 11/13/17 1450 11/13/17 1638 11/14/17 0246  WBC 6.0  --   --  4.3  NEUTROABS 4.3  --   --   --   HGB 16.6 18.0* 17.3* 14.2  HCT 49.1 53.0* 51.0 44.4  MCV 85.7  --   --  86.9  PLT 278  --   --  228   Basic Metabolic Panel: Recent Labs  Lab 11/13/17 1413 11/13/17 1450 11/13/17 1638 11/14/17 0246  NA 145 146* 146* 139  K 4.3 4.1 3.4* 4.1  CL 102 101 105 100*  CO2 30  --   --  29  GLUCOSE 55* 57* 35* 235*  BUN 30*  33* 33* 21*  CREATININE 1.57* 1.60* 1.40* 1.39*  CALCIUM 10.6*  --   --  9.1  MG  --   --   --  2.3  PHOS  --   --   --  4.1    Liver Function Tests: Recent Labs  Lab 11/13/17 1413 11/14/17 0246  AST 19 24  ALT 28 22  ALKPHOS 95 72  BILITOT 1.0 1.6*  PROT 8.0 6.3*  ALBUMIN 4.8 3.7   No results for input(s): LIPASE, AMYLASE in the last 168 hours. No results for input(s): AMMONIA in the last 168 hours. Coagulation Profile: No results for input(s): INR, PROTIME in the last 168 hours. Cardiac Enzymes: No results for input(s): CKTOTAL, CKMB, CKMBINDEX,  TROPONINI in the last 168 hours. BNP (last 3 results) No results for input(s): PROBNP in the last 8760 hours. CBG: Recent Labs  Lab 11/14/17 0437 11/14/17 0752 11/14/17 1119 11/14/17 1449 11/14/17 1548  GLUCAP 205* 117* 173* >600* 556*   Studies: Ct Head Wo Contrast  Result Date: 11/13/2017 CLINICAL DATA:  Hypoglycemia and confusion EXAM: CT HEAD WITHOUT CONTRAST TECHNIQUE: Contiguous axial images were obtained from the base of the skull through the vertex without intravenous contrast. COMPARISON:  10/31/2006 FINDINGS: BRAIN: The ventricles and sulci are normal. No intraparenchymal hemorrhage, mass effect nor midline shift. No acute large vascular territory infarcts. Grey-white matter distinction is maintained. The basal ganglia are unremarkable. No abnormal extra-axial fluid collections. Basal cisterns are not effaced and midline. The brainstem and cerebellar hemispheres are without acute abnormalities. VASCULAR: Mild-to-moderate atherosclerosis of the carotid siphons bilaterally. No hyperdense vessel sign. SKULL/SOFT TISSUES: No skull fracture. No significant soft tissue swelling. ORBITS/SINUSES: The included ocular globes and orbital contents are normal.The mastoid air cells are clear. The included paranasal sinuses are well-aerated. OTHER: None. IMPRESSION: No acute intracranial abnormality. 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 Electronically Signed   By: Tollie Eth M.D.   On: 11/13/2017 17:54    Scheduled Meds: . insulin aspart  0-15 Units Subcutaneous TID WC  . insulin aspart  0-5 Units Subcutaneous QHS  . insulin aspart  3 Units Subcutaneous Once  . insulin NPH Human  12 Units Subcutaneous BID AC  . nicotine  21 mg Transdermal Daily  . protein supplement shake  11 oz Oral BID BM   Continuous Infusions: PRN Meds: acetaminophen **OR** acetaminophen, dextrose, ondansetron **OR** ondansetron (ZOFRAN) IV  Time spent: 35 minutes  Author: Lynden Oxford, MD [facility] Pager:  [phone] 11/14/2017 4:08 PM  If 7PM-7AM, please contact night-coverage at [email or web address], password [facility]

## 2017-11-14 NOTE — Discharge Instructions (Signed)
Get help right away if:  You still have symptoms after you eat or drink something sugary.  Your blood sugar is at or below 54 mg/dL (3 mmol/L).  You have jerky movements that you cannot control.  You pass out. These symptoms may be an emergency. Do not wait to see if the symptoms will go away. Get medical help right away. Call your local emergency services (911 in the [place] Do not drive yourself to the hospital. This information is not intended to replace advice given to you by your health care provider. Make sure you discuss any questions you have with your health care provider. Document Released: 11/12/2009 Document Revised: 01/24/2016 Document Reviewed: 09/21/2015 Elsevier Interactive Patient Education  2018 [place].  Correction Insulin Correction insulin, also called corrective insulin or a supplemental dose, is a small amount of insulin that can be used to lower your blood sugar (glucose) if it is too high. You may be instructed to check your blood glucose at certain times of the day and to use correction insulin as needed to lower your blood glucose to your target range. Correction insulin is primarily used as part of diabetes management. It may also be prescribed for people who do not have diabetes. What is a correction scale? A correction scale, also called a sliding scale, is prescribed by your health care provider to help you determine when you need correction insulin. Your correction scale is based on your individual treatment goals, and it has two parts:  Ranges of blood glucose levels.  How much correction insulin to give yourself if your blood sugar falls within a certain range.  If your blood glucose is in your desired range, you will not need correction insulin and you should take your normal insulin dose. What type of insulin do I need? Your health care provider may prescribe rapid-acting or short-acting insulin for you to use as correction insulin. Rapid-acting  insulin:  Starts working quickly, in as little as 5 minutes.  Can last for 3-6 hours.  Works well when taken right before a meal to quickly lower blood glucose. Short-acting insulin:  Starts working in about 30 minutes.  Can last for 6-8 hours.  Should be taken about 30 minutes before you start eating a meal. Talk with your health care provider or pharmacist about which type of correction insulin to take and when to take it. If you use insulin to control your diabetes, you should use correction insulin in addition to the longer-acting (basal) insulin that you normally use. How do I manage my blood glucose with correction insulin? Giving a correction dose  Check your blood glucose as directed by your health care provider.  Use your correction scale to find the range that your blood glucose is in.  Identify the units of insulin that match your blood glucose range.  Make sure you have food available that you can eat in the next 15-30 minutes, after your correction dose.  Give yourself the dose of correction insulin that your health care provider has prescribed in your correction scale. Always make sure you are using the right type of insulin. ? If your correction insulin is rapid-acting, start eating a meal within 15 minutes after your correction dose to keep your blood glucose from getting too low. ? If your correction insulin is short-acting, start eating a meal within 30 minutes after your correction dose to keep your blood glucose from getting too low. Keeping a blood glucose log  Write down your  blood glucose test results and the amount of insulin that you give yourself. Do this every time you check blood glucose or take insulin. Bring this log with you to your medical visits. This information will help your health care provider to manage your medicines.  Note anything that may affect your blood glucose, such as: ? Changes in normal exercise or activity. ? Changes in your normal  schedule, such as changes in your sleep routine, going on vacation, changing your diet, or holidays. ? New over-the-counter or prescription medicines. ? Illness, stress, or anxiety. ? Changes in the time that you took your medicine or insulin. ? Changes in your meals, such as skipping a meal, having a late meal, or dining out. ? Eating things that may affect blood glucose, such as snacks, meal portions that are larger than normal, drinks that contain sugar, or eating less than usual. What do I need to know about hyperglycemia and hypoglycemia? What is hyperglycemia? Hyperglycemia, also called high blood glucose, occurs when blood glucose is too high. Make sure you know the early signs of hyperglycemia, such as:  Increased thirst.  Hunger.  Feeling very tired.  Needing to urinate more often than usual.  Blurry vision.  What is hypoglycemia? Hypoglycemia is also called low blood glucose. Be aware of stacking your insulin doses. This happens when you correct a high blood glucose by giving yourself extra insulin too soon after a previous correction dose or mealtime dose. This may cause you to have too much insulin in your body and may put you at risk for hypoglycemia. Hypoglycemia occurs with a blood glucose level at or below 70 mg/dL (3.9 mmol/L). It is important to know the symptoms of hypoglycemia and treat it right away. Always have a 15-gram rapid-acting carbohydrate snack with you to treat low blood glucose. Family members and close friends should also know the symptoms and should understand how to treat hypoglycemia, in case you are not able to treat yourself. What are the symptoms of hypoglycemia? Hypoglycemia symptoms can include:  Hunger.  Anxiety.  Sweating and feeling clammy.  Confusion.  Dizziness or light-headedness.  Sleepiness.  Nausea.  Increased heart rate.  Headache.  Blurry vision.  Jerky movements that you cannot control  (seizure).  Nightmares.  Tingling or numbness around the mouth, lips, or tongue.  A change in speech.  Decreased ability to concentrate.  A change in coordination.  Restless sleep.  Tremors or shakes.  Fainting.  Irritability.  How do I treat hypoglycemia? If you are alert and able to swallow safely, follow the 15:15 rule:  Take 15 grams of a rapid-acting carbohydrate. Rapid-acting options include: ? 1 tube of glucose gel. ? 3 glucose pills. ? 6-8 pieces of hard candy. ? 4 oz (120 mL) of fruit juice. ? 4 oz (120 mL) of regular (not diet) soda.  Check your blood glucose 15 minutes after you take the carbohydrate. ? If the repeat blood glucose level is still at or below 70 mg/dL (3.9 mmol/L), take 15 grams of a carbohydrate again. ? If your blood glucose level does not increase above 70 mg/dL (3.9 mmol/L) after 3 tries, seek emergency medical care.  After your blood glucose level returns to normal, eat a meal or a snack within 1 hour.  How do I treat severe hypoglycemia? Severe hypoglycemia is when your blood glucose level is at or below 54 mg/dL (3 mmol/L). Severe hypoglycemia is an emergency. Do not wait to see if the symptoms will go  away. Get medical help right away. Call your local emergency services (911 in the [place] Do not drive yourself to the hospital. If you have severe hypoglycemia and you cannot eat or drink, you may need an injection of glucagon. A family member or close friend should learn how to check your blood glucose and how to give you a glucagon injection. Ask your health care provider if you need to have an emergency glucagon injection kit available. Severe hypoglycemia may need to be treated in a hospital. The treatment may include getting glucose through an IV tube. You may also need treatment for the cause of your hypoglycemia. Why do I need correction insulin if I do not have diabetes? If you do not have diabetes, your health care provider may  prescribe insulin because:  Keeping your blood glucose in the target range is important for your overall health.  You are taking medicines that cause your blood glucose to be higher than normal.  Contact a health care provider if:  You develop low blood glucose that you are not able to treat yourself.  You have high blood glucose that you are not able to correct with correction insulin.  Your blood glucose is often too low.  You used emergency glucagon to treat low blood glucose. Get help right away if:  You become unresponsive. If this happens, someone else should call emergency services (911 in the [place] right away.  Your blood glucose is lower than 54 mg/dL (3.0 mmol/L).  You become confused or you have trouble thinking clearly.  You have difficulty breathing. Summary  Correction insulin is a small amount of insulin that can be used to lower your blood sugar (glucose) if it is too high.  Talk with your health care provider or pharmacist about which type of correction insulin to take and when to take it. If you use insulin to control your diabetes, you should use correction insulin in addition to the longer-acting (basal) insulin that you normally use.  You may be instructed to check your blood glucose at certain times of the day and to use correction insulin as needed to lower your blood glucose to your target range. Always keep a log of your blood glucose values and the amount of insulin that you used.  It is important to know the symptoms of hypoglycemia and treat it right away. Always have a 15-gram rapid-acting carbohydrate snack with you to treat low blood glucose. Family members and close friends should also know the symptoms and should understand how to treat hypoglycemia, in case you are not able to treat yourself. This information is not intended to replace advice given to you by your health care provider. Make sure you discuss any questions you have with your health care  provider. Document Released: 01/09/2011 Document Revised: 05/16/2016 Document Reviewed: 05/16/2016 Elsevier Interactive Patient Education  [employer].

## 2017-11-15 DIAGNOSIS — N179 Acute kidney failure, unspecified: Secondary | ICD-10-CM

## 2017-11-15 LAB — C-PEPTIDE

## 2017-11-15 LAB — GLUCOSE, CAPILLARY
Glucose-Capillary: 238 mg/dL — ABNORMAL HIGH (ref 65–99)
Glucose-Capillary: 322 mg/dL — ABNORMAL HIGH (ref 65–99)

## 2017-11-15 MED ORDER — INSULIN ASPART 100 UNIT/ML ~~LOC~~ SOLN
0.0000 [IU] | Freq: Three times a day (TID) | SUBCUTANEOUS | Status: DC
  Administered 2017-11-15: 11 [IU] via SUBCUTANEOUS
  Administered 2017-11-15: 5 [IU] via SUBCUTANEOUS

## 2017-11-15 MED ORDER — INSULIN NPH ISOPHANE & REGULAR (70-30) 100 UNIT/ML ~~LOC~~ SUSP: 20.0000 [IU] | Freq: Two times a day (BID) | SUBCUTANEOUS | 0 refills | Status: DC

## 2017-11-15 MED ORDER — PREMIER PROTEIN SHAKE: 11.0000 [oz_av] | Freq: Two times a day (BID) | ORAL | 0 refills | Status: DC

## 2017-11-15 MED ORDER — INSULIN NPH (HUMAN) (ISOPHANE) 100 UNIT/ML ~~LOC~~ SUSP
15.0000 [IU] | Freq: Two times a day (BID) | SUBCUTANEOUS | Status: DC
  Administered 2017-11-15: 15 [IU] via SUBCUTANEOUS
  Filled 2017-11-15: qty 10

## 2017-11-15 MED ORDER — INSULIN NPH (HUMAN) (ISOPHANE) 100 UNIT/ML ~~LOC~~ SUSP
15.0000 [IU] | Freq: Two times a day (BID) | SUBCUTANEOUS | Status: DC
  Filled 2017-11-15: qty 10

## 2017-11-15 MED ORDER — INSULIN NPH ISOPHANE & REGULAR (70-30) 100 UNIT/ML ~~LOC~~ SUSP: 15.0000 [IU] | Freq: Two times a day (BID) | SUBCUTANEOUS | 0 refills | Status: DC

## 2017-11-15 NOTE — Progress Notes (Signed)
Inpatient Diabetes Program Recommendations  AACE/ADA: New Consensus Statement on Inpatient Glycemic Control (2015)  Target Ranges:  Prepandial:   less than 140 mg/dL      Peak postprandial:   less than 180 mg/dL (1-2 hours)      Critically ill patients:  140 - 180 mg/dL   Lab Results  Component Value Date   GLUCAP 238 (H) 11/15/2017   HGBA1C 10.5 (H) 11/14/2017    Review of Glycemic Control  Diabetes history: DM1 Outpatient Diabetes medications: Novolin 70/30 insulin mix bid Current orders for Inpatient glycemic control: NPH 15 units bid + Novolog moderate correction tid + hs 0-5 units  Inpatient Diabetes Program Recommendations:    -Decrease Novolog correction to sensitive tid + hs -Add Novolog 3-4 units tid meal coverage if eats 50%  Noted patient is type 1 and currently does not have insurance. [facility] Spoke with patient by phone and reviewed diabetes management prior to admission. 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 Patient has been eating approx. 2 am and taking his insulin and again @ 10 am ac breakfast and then goes to bed. Patient does not take insulin again until 2 am but has noticed his has had more low blood sugars in the evening since he has started working night shift and taking his insulin @ different times. Patient states he does not currently have a PCP but will have insurance starting in approx. 1 month. Will follow in am.  Thank you, John FischerJudy E. Samay Delcarlo, RN, MSN, CDE  Diabetes Coordinator Inpatient Glycemic Control Team Team Pager [phone] (8am-5pm) 11/15/2017 9:46 AM

## 2017-11-15 NOTE — Care Management (Signed)
Information for clinics and [facility] explained to patient and placed on AVS.  MATCH letter given for prescriptions given today.  Patient states he usually pays out of pocket for insulin without prescription and he hasn't had a PCP in 10-12 years.

## 2017-11-15 NOTE — Plan of Care (Signed)
Discussed with patient plan of care for the evening, pain management and answered questions about his diabetes with some teach back displayed

## 2017-11-15 NOTE — Progress Notes (Signed)
Discharge instructions given and understanding verbalized.  All medications reviewed and explained in detail.  All questions answered and understanding verbalized.  IV's removed with cannula intact.  Pt left ambulatory with staff guidance for discharge to POV to home.

## 2017-11-16 NOTE — Discharge Summary (Signed)
[facility] Discharge Summary   Patient: John Cox WUJ:[id number]   PCP: Patient, No Pcp Per DOB: [birth year]   Date of admission: 11/13/2017   Date of discharge: 11/15/2017   Discharge Diagnoses:  Active Problems:   Type 1 diabetes mellitus (HCC)   TOBACCO ABUSE   Polysubstance abuse (HCC)   Major depression, recurrent (HCC)   AKI (acute kidney injury) (HCC)   Malnutrition of moderate degree   Hypoglycemia due to insulin   Hypoglycemia   Admitted From: home Disposition:  home  Recommendations for Outpatient Follow-up:  1. Please call [place] [facility] to arrange an appointment as an outpatient PCP  Follow-up Information    PCP. Schedule an appointment as soon as possible for a visit in 1 week(s).        [facility] Follow up.   Why:  Please call and make an appointment.  You can use the pharmacy at this location if you become a clinic patient at any clinic.   Contact information: [address] [place] [phone]       [facility] .   Contact information: [address] [place] [phone]       [facility] Follow up.   Specialty:  Internal Medicine Contact information: [address] [place] [id number] [phone]         Diet recommendation: Carb modified diet  Activity: The patient is advised to gradually reintroduce usual activities.  Discharge Condition: good  Code Status: Full code  History of present illness: As per the H and P dictated on admission, "John LernerWilliam B Siple is a 47 y.o. male with medical history significant of DM1. Tobacco abuse, depression, polysubstance abuse, HLD    Presented with   confusion feeling jittery like his blood sugar is low he has been drinking all G today but still has persistent sensation his blood sugar be very well with states he called EMS may be 3 days ago  for the same complaints patient unable to provide detailed history secondary to confusion reports that his been having trouble his blood sugar being low for at least a week Last week he reports nausea and vomiting reports that he may have had flulike symptoms last week. He called EMS on arrival blood sugar 51 Reportedly at home patient takes NPH 70/30 25 units twice a day Reports being confused for the past few days. When asked when he administered insulin last states he is not sure what day is it. He took 35 units of NPH 70/30 last night. States he has been for the past few years.  Last admission was in 2016 for a mild DKA"  Hospital Course:  Summary of his active problems in the hospital is as following. 1.  Type 1 insulin-dependent uncontrolled diabetes. Hypoglycemia as well as hyperglycemia. Initially presented with hypoglycemia patient was started on D10 infusion as well as octreotide infusion. At present we will put the patient on moderate sensitivity sliding scale, 20 units of NPH pre-meal coverage twice daily.  2.  Polysubstance abuse. UDS is positive for cocaine and marijuana. Patient was counseled to stop using this drug.  3.  Mildly KI. Patient was given IV hydration renal function back to normal.  4.  Active smoker. Nicotine patch.  All other chronic medical condition were stable during the hospitalization.  Patient was seen by physical therapy, who recommended no PT follow up  needed. Case manager assisted the patient providing information for outpatient follow-up with PCP  On the day of the discharge the patient's vitals were stable, and no other acute medical condition were reported by patient. the patient was felt safe to be discharge at home with family.  Procedures and Results:  none   Consultations:  none  DISCHARGE MEDICATION: Allergies as of 11/15/2017   No Known Allergies     Medication List    TAKE these medications   cyclobenzaprine 10 MG  tablet Commonly known as:  FLEXERIL Take 1 tablet (10 mg total) by mouth 2 (two) times daily as needed for muscle spasms.   feeding supplement (ENSURE ENLIVE) Liqd Take 237 mLs by mouth 2 (two) times daily between meals.   insulin NPH-regular Human (70-30) 100 UNIT/ML injection Commonly known as:  NOVOLIN 70/30 Inject 20 Units into the skin 2 (two) times daily with a meal. What changed:  how much to take   insulin regular 100 units/mL injection Commonly known as:  NOVOLIN R,HUMULIN R If BG 70 - 120: use 0 units, BG 121-150: 2 units,  BG 151-200: 3 units, BG 201-250: 5 units, BG 251-300: 8 units, BG 301-350: 11 units, BG 351 - 400: 15 units.   Insulin Syringe-Needle U-100 29G X 1/2" 0.3 ML Misc Commonly known as:  SAFETY-GLIDE 0.3CC SYR 29GX1/2 Use as directed.   meloxicam 15 MG tablet Commonly known as:  MOBIC Take 1 tablet (15 mg total) by mouth daily.   nicotine 21 mg/24hr patch Commonly known as:  NICODERM CQ - dosed in mg/24 hours Place 1 patch (21 mg total) onto the skin daily.   protein supplement shake Liqd Commonly known as:  PREMIER PROTEIN Take 325 mLs (11 oz total) by mouth 2 (two) times daily between meals.      No Known Allergies Discharge Instructions    Diet - low sodium heart healthy   Complete by:  As directed    Discharge instructions   Complete by:  As directed    It is important that you read following instructions as well as go over your medication list with RN to help you understand your care after this hospitalization.  Discharge Instructions: Please follow-up with PCP in one week  Please request your primary care physician to go over all Hospital Tests and Procedure/Radiological results at the follow up,  Please get all Hospital records sent to your PCP by signing hospital release before you go home.   Do not take more than prescribed Pain, Sleep and Anxiety Medications. You were cared for by a hospitalist during your hospital stay. If you have  any questions about your discharge medications or the care you received while you were in the hospital after you are discharged, you can call the unit and ask to speak with the hospitalist on call if the hospitalist that took care of you is not available.  Once you are discharged, your primary care physician will handle any further medical issues. Please note that NO REFILLS for any discharge medications will be authorized once you are discharged, as it is imperative that you return to your primary care physician (or establish a relationship with a primary care physician if you do not have one) for your aftercare needs so that they can reassess your need for medications and monitor your lab values. You Must read complete instructions/literature along with all the possible adverse reactions/side effects for all the Medicines you take and that have been prescribed to you.  Take any new Medicines after you have completely understood and accept all the possible adverse reactions/side effects. Wear Seat belts while driving. If you have smoked or chewed Tobacco in the last 2 yrs please stop smoking and/or stop any Recreational drug use.   Increase activity slowly   Complete by:  As directed      Discharge Exam: Filed Weights   11/13/17 2144  Weight: 74.5 kg (164 lb 3.9 oz)   Vitals:   11/14/17 2057 11/15/17 1151  BP: 107/69 130/75  Pulse: 79 83  Resp: 16 17  Temp: 98.4 F (36.9 C) 98.2 F (36.8 C)  SpO2: 97% 100%   General: Appear in no distress, no Rash; Oral Mucosa moist. Cardiovascular: S1 and S2 Present, no Murmur, no JVD Respiratory: Bilateral Air entry present and Clear to Auscultation, no Crackles, no wheezes Abdomen: Bowel Sound present, Soft and no tenderness Extremities: no Pedal edema, no calf tenderness Neurology: Grossly no focal neuro deficit.  The results of significant diagnostics from this hospitalization (including imaging, microbiology, ancillary and laboratory) are listed  below for reference.    Significant Diagnostic Studies: Dg Chest 2 View  Result Date: 11/13/2017 CLINICAL DATA:  Glycemia with cough and shortness of breath EXAM: CHEST - 2 VIEW COMPARISON:  July 18, 2015 FINDINGS: Lungs are clear. Heart size and pulmonary vascularity are normal. No adenopathy. No bone lesions. IMPRESSION: No edema or consolidation. Electronically Signed   By: Bretta Bang III M.D.   On: 11/13/2017 14:44   Ct Head Wo Contrast  Result Date: 11/13/2017 CLINICAL DATA:  Hypoglycemia and confusion EXAM: CT HEAD WITHOUT CONTRAST TECHNIQUE: Contiguous axial images were obtained from the base of the skull through the vertex without intravenous contrast. COMPARISON:  10/31/2006 FINDINGS: BRAIN: The ventricles and sulci are normal. No intraparenchymal hemorrhage, mass effect nor midline shift. No acute large vascular territory infarcts. Grey-white matter distinction is maintained. The basal ganglia are unremarkable. No abnormal extra-axial fluid collections. Basal cisterns are not effaced and midline. The brainstem and cerebellar hemispheres are without acute abnormalities. VASCULAR: Mild-to-moderate atherosclerosis of the carotid siphons bilaterally. No hyperdense vessel sign. SKULL/SOFT TISSUES: No skull fracture. No significant soft tissue swelling. ORBITS/SINUSES: The included ocular globes and orbital contents are normal.The mastoid air cells are clear. The included paranasal sinuses are well-aerated. OTHER: None. IMPRESSION: No acute intracranial abnormality. 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 Electronically Signed   By: Tollie Eth M.D.   On: 11/13/2017 17:54    Microbiology: Recent Results (from the past 240 hour(s))  MRSA PCR Screening     Status: None   Collection Time: 11/13/17  9:46 PM  Result Value Ref Range Status   MRSA by PCR NEGATIVE NEGATIVE Final    Comment:        The GeneXpert MRSA Assay (FDA approved for NASAL specimens only), is one component of  a comprehensive MRSA colonization surveillance program. It is not intended to diagnose MRSA infection nor to guide or monitor treatment for MRSA infections. Performed at [facility], [address], [place], [place] [address]      Labs: CBC: Recent Labs  Lab 11/13/17 1413 11/13/17 1450 11/13/17 1638 11/14/17 0246  WBC 6.0  --   --  4.3  NEUTROABS 4.3  --   --   --   HGB 16.6 18.0* 17.3* 14.2  HCT 49.1 53.0* 51.0 44.4  MCV 85.7  --   --  86.9  PLT 278  --   --  228  Basic Metabolic Panel: Recent Labs  Lab 11/13/17 1413 11/13/17 1450 11/13/17 1638 11/14/17 0246 11/14/17 1522 11/14/17 1717  NA 145 146* 146* 139 126* 132*  K 4.3 4.1 3.4* 4.1 6.9* 4.2  CL 102 101 105 100* 92* 94*  CO2 30  --   --  29 28 27   GLUCOSE 55* 57* 35* 235* 610* 420*  BUN 30* 33* 33* 21* 22* 24*  CREATININE 1.57* 1.60* 1.40* 1.39* 1.66* 1.60*  CALCIUM 10.6*  --   --  9.1 8.6* 8.9  MG  --   --   --  2.3  --   --   PHOS  --   --   --  4.1  --   --    Liver Function Tests: Recent Labs  Lab 11/13/17 1413 11/14/17 0246  AST 19 24  ALT 28 22  ALKPHOS 95 72  BILITOT 1.0 1.6*  PROT 8.0 6.3*  ALBUMIN 4.8 3.7   No results for input(s): LIPASE, AMYLASE in the last 168 hours. No results for input(s): AMMONIA in the last 168 hours. Cardiac Enzymes: No results for input(s): CKTOTAL, CKMB, CKMBINDEX, TROPONINI in the last 168 hours. BNP (last 3 results) No results for input(s): BNP in the last 8760 hours. CBG: Recent Labs  Lab 11/14/17 1449 11/14/17 1548 11/14/17 2120 11/15/17 0734 11/15/17 1153  GLUCAP >600* 556* 186* 238* 322*   Time spent: 35 minutes  Signed:  Lynden Oxford  [facility] 11/15/2017 , 8:01 AM

## 2017-12-01 LAB — PROINSULIN/INSULIN RATIO
Insulin: 52 u[IU]/mL — ABNORMAL HIGH
PROINSULIN/INSULIN RATIO: 2 %
Proinsulin: 5.4 pmol/L

## 2018-01-24 ENCOUNTER — Encounter (HOSPITAL_COMMUNITY): Payer: Self-pay | Admitting: Emergency Medicine

## 2018-01-24 ENCOUNTER — Observation Stay (HOSPITAL_COMMUNITY)
Admission: EM | Admit: 2018-01-24 | Discharge: 2018-01-26 | Disposition: A | Discharge status: Home / Self Care | Payer: Self-pay | Attending: Internal Medicine | Admitting: Internal Medicine

## 2018-01-24 ENCOUNTER — Emergency Department (HOSPITAL_COMMUNITY): Payer: Self-pay

## 2018-01-24 DIAGNOSIS — E1110 Type 2 diabetes mellitus with ketoacidosis without coma: Secondary | ICD-10-CM | POA: Diagnosis present

## 2018-01-24 DIAGNOSIS — J189 Pneumonia, unspecified organism: Principal | ICD-10-CM | POA: Insufficient documentation

## 2018-01-24 DIAGNOSIS — R739 Hyperglycemia, unspecified: Secondary | ICD-10-CM

## 2018-01-24 DIAGNOSIS — Z9114 Patient's other noncompliance with medication regimen: Secondary | ICD-10-CM | POA: Insufficient documentation

## 2018-01-24 DIAGNOSIS — F1410 Cocaine abuse, uncomplicated: Secondary | ICD-10-CM | POA: Insufficient documentation

## 2018-01-24 DIAGNOSIS — E109 Type 1 diabetes mellitus without complications: Secondary | ICD-10-CM | POA: Diagnosis present

## 2018-01-24 DIAGNOSIS — E875 Hyperkalemia: Secondary | ICD-10-CM | POA: Insufficient documentation

## 2018-01-24 DIAGNOSIS — E860 Dehydration: Secondary | ICD-10-CM | POA: Insufficient documentation

## 2018-01-24 DIAGNOSIS — Z794 Long term (current) use of insulin: Secondary | ICD-10-CM | POA: Insufficient documentation

## 2018-01-24 DIAGNOSIS — E1010 Type 1 diabetes mellitus with ketoacidosis without coma: Secondary | ICD-10-CM | POA: Insufficient documentation

## 2018-01-24 DIAGNOSIS — R000 Tachycardia, unspecified: Secondary | ICD-10-CM | POA: Insufficient documentation

## 2018-01-24 DIAGNOSIS — N179 Acute kidney failure, unspecified: Secondary | ICD-10-CM | POA: Insufficient documentation

## 2018-01-24 DIAGNOSIS — Z79899 Other long term (current) drug therapy: Secondary | ICD-10-CM | POA: Insufficient documentation

## 2018-01-24 DIAGNOSIS — E119 Type 2 diabetes mellitus without complications: Secondary | ICD-10-CM

## 2018-01-24 DIAGNOSIS — F17200 Nicotine dependence, unspecified, uncomplicated: Secondary | ICD-10-CM | POA: Insufficient documentation

## 2018-01-24 DIAGNOSIS — R911 Solitary pulmonary nodule: Secondary | ICD-10-CM | POA: Insufficient documentation

## 2018-01-24 HISTORY — DX: Acute kidney failure, unspecified: N17.9

## 2018-01-24 HISTORY — DX: Hyperglycemia, unspecified: R73.9

## 2018-01-24 LAB — URINALYSIS, ROUTINE W REFLEX MICROSCOPIC
BACTERIA UA: NONE SEEN
BILIRUBIN URINE: NEGATIVE
Glucose, UA: >=500 mg/dL — AB
Hgb urine dipstick: NEGATIVE
KETONES UR: 20 mg/dL — AB
LEUKOCYTES UA: NEGATIVE
NITRITE: NEGATIVE
Protein, ur: NEGATIVE mg/dL
Specific Gravity, Urine: 1.024 (ref 1.005–1.030)
pH: 5 (ref 5.0–8.0)

## 2018-01-24 LAB — CBC
HEMATOCRIT: 44.5 % (ref 39.0–52.0)
HEMOGLOBIN: 13.9 g/dL (ref 13.0–17.0)
MCH: 27.7 pg (ref 26.0–34.0)
MCHC: 31.2 g/dL (ref 30.0–36.0)
MCV: 88.6 fL (ref 78.0–100.0)
Platelets: 327 10*3/uL (ref 150–400)
RBC: 5.02 MIL/uL (ref 4.22–5.81)
RDW: 14.6 % (ref 11.5–15.5)
WBC: 8.4 10*3/uL (ref 4.0–10.5)

## 2018-01-24 LAB — CBG MONITORING, ED

## 2018-01-24 MED ORDER — SODIUM CHLORIDE 0.9 % IV BOLUS
2000.0000 mL | Freq: Once | INTRAVENOUS | Status: AC
  Administered 2018-01-25: 2000 mL via INTRAVENOUS

## 2018-01-24 MED ORDER — ALBUTEROL SULFATE HFA 108 (90 BASE) MCG/ACT IN AERS
2.0000 | INHALATION_SPRAY | RESPIRATORY_TRACT | Status: DC
  Filled 2018-01-24: qty 6.7

## 2018-01-24 MED ORDER — SODIUM CHLORIDE 0.9 % IV SOLN
1.0000 g | Freq: Once | INTRAVENOUS | Status: AC
  Administered 2018-01-25: 1 g via INTRAVENOUS
  Filled 2018-01-24: qty 10

## 2018-01-24 MED ORDER — INSULIN ASPART 100 UNIT/ML ~~LOC~~ SOLN
14.0000 [IU] | Freq: Once | SUBCUTANEOUS | Status: AC
  Administered 2018-01-25: 14 [IU] via INTRAVENOUS
  Filled 2018-01-24: qty 1

## 2018-01-24 NOTE — ED Triage Notes (Signed)
Pt c/o productive cough with greens sputum x 1 week, +n/v. Has not checked BS x 1 week and has been out of insulin for several days.

## 2018-01-25 ENCOUNTER — Other Ambulatory Visit: Payer: Self-pay

## 2018-01-25 ENCOUNTER — Encounter (HOSPITAL_COMMUNITY): Payer: Self-pay | Admitting: Internal Medicine

## 2018-01-25 ENCOUNTER — Inpatient Hospital Stay (HOSPITAL_COMMUNITY): Payer: Self-pay

## 2018-01-25 DIAGNOSIS — E1010 Type 1 diabetes mellitus with ketoacidosis without coma: Secondary | ICD-10-CM

## 2018-01-25 DIAGNOSIS — R739 Hyperglycemia, unspecified: Secondary | ICD-10-CM

## 2018-01-25 DIAGNOSIS — N179 Acute kidney failure, unspecified: Secondary | ICD-10-CM

## 2018-01-25 DIAGNOSIS — J189 Pneumonia, unspecified organism: Secondary | ICD-10-CM | POA: Diagnosis present

## 2018-01-25 DIAGNOSIS — R000 Tachycardia, unspecified: Secondary | ICD-10-CM

## 2018-01-25 DIAGNOSIS — E875 Hyperkalemia: Secondary | ICD-10-CM

## 2018-01-25 HISTORY — DX: Acute kidney failure, unspecified: N17.9

## 2018-01-25 HISTORY — DX: Hyperglycemia, unspecified: R73.9

## 2018-01-25 LAB — CBG MONITORING, ED
GLUCOSE-CAPILLARY: 364 mg/dL — AB (ref 65–99)
GLUCOSE-CAPILLARY: 328 mg/dL — AB (ref 65–99)
GLUCOSE-CAPILLARY: 160 mg/dL — AB (ref 65–99)
GLUCOSE-CAPILLARY: 98 mg/dL (ref 65–99)
Glucose-Capillary: 458 mg/dL — ABNORMAL HIGH (ref 65–99)
Glucose-Capillary: 203 mg/dL — ABNORMAL HIGH (ref 65–99)
Glucose-Capillary: 169 mg/dL — ABNORMAL HIGH (ref 65–99)
Glucose-Capillary: 191 mg/dL — ABNORMAL HIGH (ref 65–99)
Glucose-Capillary: 206 mg/dL — ABNORMAL HIGH (ref 65–99)

## 2018-01-25 LAB — BASIC METABOLIC PANEL
ANION GAP: 11 (ref 5–15)
ANION GAP: 8 (ref 5–15)
ANION GAP: 21 — AB (ref 5–15)
Anion gap: 20 — ABNORMAL HIGH (ref 5–15)
Anion gap: 16 — ABNORMAL HIGH (ref 5–15)
BUN: 18 mg/dL (ref 6–20)
BUN: 22 mg/dL — AB (ref 6–20)
BUN: 30 mg/dL — AB (ref 6–20)
BUN: 36 mg/dL — AB (ref 6–20)
BUN: 33 mg/dL — AB (ref 6–20)
CHLORIDE: 107 mmol/L (ref 101–111)
CHLORIDE: 94 mmol/L — AB (ref 101–111)
CHLORIDE: 85 mmol/L — AB (ref 101–111)
CO2: 16 mmol/L — ABNORMAL LOW (ref 22–32)
CO2: 18 mmol/L — ABNORMAL LOW (ref 22–32)
CO2: 23 mmol/L (ref 22–32)
CO2: 26 mmol/L (ref 22–32)
CO2: 29 mmol/L (ref 22–32)
CREATININE: 2.42 mg/dL — AB (ref 0.61–1.24)
Calcium: 8.4 mg/dL — ABNORMAL LOW (ref 8.9–10.3)
Calcium: 8.9 mg/dL (ref 8.9–10.3)
Calcium: 9 mg/dL (ref 8.9–10.3)
Calcium: 9.1 mg/dL (ref 8.9–10.3)
Calcium: 9.2 mg/dL (ref 8.9–10.3)
Chloride: 100 mmol/L — ABNORMAL LOW (ref 101–111)
Chloride: 105 mmol/L (ref 101–111)
Creatinine, Ser: 2.62 mg/dL — ABNORMAL HIGH (ref 0.61–1.24)
Creatinine, Ser: 1.97 mg/dL — ABNORMAL HIGH (ref 0.61–1.24)
Creatinine, Ser: 1.46 mg/dL — ABNORMAL HIGH (ref 0.61–1.24)
Creatinine, Ser: 1.08 mg/dL (ref 0.61–1.24)
GFR calc Af Amer: 32 mL/min — ABNORMAL LOW (ref >60)
GFR calc Af Amer: 35 mL/min — ABNORMAL LOW (ref >60)
GFR calc Af Amer: >60 mL/min (ref >60)
GFR calc Af Amer: >60 mL/min (ref >60)
GFR calc non Af Amer: 27 mL/min — ABNORMAL LOW (ref >60)
GFR calc non Af Amer: 56 mL/min — ABNORMAL LOW (ref >60)
GFR calc non Af Amer: >60 mL/min (ref >60)
GFR, EST AFRICAN AMERICAN: 45 mL/min — AB (ref >60)
GFR, EST NON AFRICAN AMERICAN: 39 mL/min — AB (ref >60)
GFR, EST NON AFRICAN AMERICAN: 30 mL/min — AB (ref >60)
GLUCOSE: 490 mg/dL — AB (ref 65–99)
GLUCOSE: 124 mg/dL — AB (ref 65–99)
GLUCOSE: 208 mg/dL — AB (ref 65–99)
Glucose, Bld: 1000 mg/dL (ref 65–99)
Glucose, Bld: 1113 mg/dL (ref 65–99)
POTASSIUM: 3.8 mmol/L (ref 3.5–5.1)
POTASSIUM: 4.1 mmol/L (ref 3.5–5.1)
POTASSIUM: 4.3 mmol/L (ref 3.5–5.1)
POTASSIUM: 4.8 mmol/L (ref 3.5–5.1)
POTASSIUM: 6.8 mmol/L — AB (ref 3.5–5.1)
SODIUM: 132 mmol/L — AB (ref 135–145)
SODIUM: 139 mmol/L (ref 135–145)
SODIUM: 122 mmol/L — AB (ref 135–145)
Sodium: 145 mmol/L (ref 135–145)
Sodium: 141 mmol/L (ref 135–145)

## 2018-01-25 LAB — I-STAT CG4 LACTIC ACID, ED: LACTIC ACID, VENOUS: 2.03 mmol/L — AB (ref 0.5–1.9)

## 2018-01-25 LAB — CBC WITH DIFFERENTIAL/PLATELET
ABS IMMATURE GRANULOCYTES: 0.1 10*3/uL (ref 0.0–0.1)
BASOS ABS: 0 10*3/uL (ref 0.0–0.1)
Basophils Relative: 0 %
Eosinophils Absolute: 0 10*3/uL (ref 0.0–0.7)
Eosinophils Relative: 0 %
HEMATOCRIT: 39.9 % (ref 39.0–52.0)
HEMOGLOBIN: 13 g/dL (ref 13.0–17.0)
IMMATURE GRANULOCYTES: 1 %
LYMPHS ABS: 1.2 10*3/uL (ref 0.7–4.0)
LYMPHS PCT: 13 %
MCH: 27.6 pg (ref 26.0–34.0)
MCHC: 32.6 g/dL (ref 30.0–36.0)
MCV: 84.7 fL (ref 78.0–100.0)
Monocytes Absolute: 0.9 10*3/uL (ref 0.1–1.0)
Monocytes Relative: 10 %
NEUTROS ABS: 6.9 10*3/uL (ref 1.7–7.7)
NEUTROS PCT: 76 %
Platelets: 331 10*3/uL (ref 150–400)
RBC: 4.71 MIL/uL (ref 4.22–5.81)
RDW: 14.1 % (ref 11.5–15.5)
WBC: 9.1 10*3/uL (ref 4.0–10.5)

## 2018-01-25 LAB — TROPONIN I
Troponin I: <0.03 ng/mL (ref <0.03)
Troponin I: <0.03 ng/mL (ref <0.03)

## 2018-01-25 LAB — I-STAT VENOUS BLOOD GAS, ED
ACID-BASE DEFICIT: 9 mmol/L — AB (ref 0.0–2.0)
Bicarbonate: 18.2 mmol/L — ABNORMAL LOW (ref 20.0–28.0)
O2 SAT: 76 %
TCO2: 19 mmol/L — ABNORMAL LOW (ref 22–32)
pCO2, Ven: 41.8 mmHg — ABNORMAL LOW (ref 44.0–60.0)
pH, Ven: 7.248 — ABNORMAL LOW (ref 7.250–7.430)
pO2, Ven: 47 mmHg — ABNORMAL HIGH (ref 32.0–45.0)

## 2018-01-25 LAB — GLUCOSE, CAPILLARY
Glucose-Capillary: 222 mg/dL — ABNORMAL HIGH (ref 65–99)
Glucose-Capillary: 285 mg/dL — ABNORMAL HIGH (ref 65–99)

## 2018-01-25 LAB — STREP PNEUMONIAE URINARY ANTIGEN: Strep Pneumo Urinary Antigen: NEGATIVE

## 2018-01-25 LAB — HEMOGLOBIN A1C
Hgb A1c MFr Bld: 11.4 % — ABNORMAL HIGH (ref 4.8–5.6)
MEAN PLASMA GLUCOSE: 280.48 mg/dL

## 2018-01-25 MED ORDER — INSULIN ASPART 100 UNIT/ML ~~LOC~~ SOLN
0.0000 [IU] | Freq: Three times a day (TID) | SUBCUTANEOUS | Status: DC
  Administered 2018-01-25 (×2): 5 [IU] via SUBCUTANEOUS
  Administered 2018-01-26: 2 [IU] via SUBCUTANEOUS
  Filled 2018-01-25 (×2): qty 1

## 2018-01-25 MED ORDER — LACTATED RINGERS IV SOLN
INTRAVENOUS | Status: AC
  Administered 2018-01-25: 75 mL/h via INTRAVENOUS
  Administered 2018-01-26: 06:00:00 via INTRAVENOUS

## 2018-01-25 MED ORDER — IPRATROPIUM-ALBUTEROL 0.5-2.5 (3) MG/3ML IN SOLN
3.0000 mL | Freq: Four times a day (QID) | RESPIRATORY_TRACT | Status: DC
  Administered 2018-01-25 (×3): 3 mL via RESPIRATORY_TRACT
  Filled 2018-01-25 (×2): qty 3

## 2018-01-25 MED ORDER — SODIUM CHLORIDE 0.9 % IV SOLN
INTRAVENOUS | Status: DC
  Filled 2018-01-25: qty 1

## 2018-01-25 MED ORDER — IPRATROPIUM-ALBUTEROL 0.5-2.5 (3) MG/3ML IN SOLN
3.0000 mL | Freq: Two times a day (BID) | RESPIRATORY_TRACT | Status: DC
  Filled 2018-01-25: qty 3

## 2018-01-25 MED ORDER — LEVOFLOXACIN 500 MG PO TABS: 500.0000 mg | ORAL_TABLET | ORAL | Status: DC

## 2018-01-25 MED ORDER — INSULIN ASPART PROT & ASPART (70-30 MIX) 100 UNIT/ML ~~LOC~~ SUSP
18.0000 [IU] | Freq: Two times a day (BID) | SUBCUTANEOUS | Status: DC
  Administered 2018-01-25 – 2018-01-26 (×3): 18 [IU] via SUBCUTANEOUS
  Filled 2018-01-25 (×2): qty 10

## 2018-01-25 MED ORDER — DEXTROSE-NACL 5-0.45 % IV SOLN: INTRAVENOUS | Status: DC

## 2018-01-25 MED ORDER — SODIUM CHLORIDE 0.9 % IV SOLN: INTRAVENOUS | Status: DC

## 2018-01-25 MED ORDER — MAGNESIUM SULFATE IN D5W 1-5 GM/100ML-% IV SOLN
1.0000 g | Freq: Once | INTRAVENOUS | Status: AC
  Administered 2018-01-25: 1 g via INTRAVENOUS
  Filled 2018-01-25: qty 100

## 2018-01-25 MED ORDER — ACETAMINOPHEN 500 MG PO TABS
500.0000 mg | ORAL_TABLET | Freq: Four times a day (QID) | ORAL | Status: DC | PRN
  Administered 2018-01-25 – 2018-01-26 (×3): 500 mg via ORAL
  Filled 2018-01-25 (×3): qty 1

## 2018-01-25 MED ORDER — ENSURE ENLIVE PO LIQD
237.0000 mL | Freq: Two times a day (BID) | ORAL | Status: DC
  Filled 2018-01-25 (×2): qty 237

## 2018-01-25 MED ORDER — SODIUM CHLORIDE 0.9 % IV SOLN
1.0000 g | INTRAVENOUS | Status: DC
  Administered 2018-01-25: 1 g via INTRAVENOUS
  Filled 2018-01-25: qty 10

## 2018-01-25 MED ORDER — DEXTROSE 50 % IV SOLN: 25.0000 mL | INTRAVENOUS | Status: DC | PRN

## 2018-01-25 MED ORDER — SODIUM CHLORIDE 0.9 % IV SOLN
INTRAVENOUS | Status: AC
  Administered 2018-01-25: 5.4 [IU]/h via INTRAVENOUS
  Filled 2018-01-25: qty 1

## 2018-01-25 MED ORDER — VANCOMYCIN HCL 10 G IV SOLR
1250.0000 mg | Freq: Once | INTRAVENOUS | Status: AC
  Administered 2018-01-25: 1250 mg via INTRAVENOUS
  Filled 2018-01-25: qty 1250

## 2018-01-25 MED ORDER — DEXTROSE-NACL 5-0.45 % IV SOLN
INTRAVENOUS | Status: AC
  Administered 2018-01-25: 08:00:00 via INTRAVENOUS

## 2018-01-25 MED ORDER — ENOXAPARIN SODIUM 40 MG/0.4ML ~~LOC~~ SOLN
40.0000 mg | Freq: Every day | SUBCUTANEOUS | Status: DC
  Administered 2018-01-26: 40 mg via SUBCUTANEOUS
  Filled 2018-01-25: qty 0.4

## 2018-01-25 MED ORDER — SODIUM CHLORIDE 0.9 % IV SOLN
INTRAVENOUS | Status: DC
  Administered 2018-01-25: 01:00:00 via INTRAVENOUS

## 2018-01-25 MED ORDER — SODIUM CHLORIDE 0.9 % IV SOLN
1.0000 g | Freq: Two times a day (BID) | INTRAVENOUS | Status: DC
  Filled 2018-01-25: qty 1

## 2018-01-25 MED ORDER — IPRATROPIUM-ALBUTEROL 0.5-2.5 (3) MG/3ML IN SOLN
RESPIRATORY_TRACT | Status: AC
  Filled 2018-01-25: qty 3

## 2018-01-25 MED ORDER — SODIUM CHLORIDE 0.9 % IV SOLN
500.0000 mg | INTRAVENOUS | Status: DC
  Administered 2018-01-25: 500 mg via INTRAVENOUS
  Filled 2018-01-25: qty 500

## 2018-01-25 MED ORDER — SODIUM CHLORIDE 0.9 % IV SOLN: 1250.0000 mg | INTRAVENOUS | Status: DC

## 2018-01-25 MED ORDER — SODIUM POLYSTYRENE SULFONATE 15 GM/60ML PO SUSP: 15.0000 g | Freq: Once | ORAL | Status: DC

## 2018-01-25 MED ORDER — ACETAMINOPHEN 325 MG PO TABS
650.0000 mg | ORAL_TABLET | Freq: Once | ORAL | Status: AC
  Administered 2018-01-25: 650 mg via ORAL
  Filled 2018-01-25: qty 2

## 2018-01-25 MED ORDER — LEVOFLOXACIN 500 MG PO TABS
500.0000 mg | ORAL_TABLET | Freq: Every day | ORAL | Status: DC
  Administered 2018-01-25: 500 mg via ORAL
  Filled 2018-01-25 (×2): qty 1

## 2018-01-25 MED ORDER — IBUPROFEN 400 MG PO TABS
400.0000 mg | ORAL_TABLET | Freq: Once | ORAL | Status: AC
Start: 2018-01-25 — End: 2018-01-25
  Administered 2018-01-25: 400 mg via ORAL
  Filled 2018-01-25: qty 1

## 2018-01-25 MED ORDER — INSULIN ASPART 100 UNIT/ML ~~LOC~~ SOLN
0.0000 [IU] | Freq: Every day | SUBCUTANEOUS | Status: DC
  Administered 2018-01-25: 3 [IU] via SUBCUTANEOUS

## 2018-01-25 MED ORDER — ENOXAPARIN SODIUM 30 MG/0.3ML ~~LOC~~ SOLN
30.0000 mg | Freq: Every day | SUBCUTANEOUS | Status: DC
  Administered 2018-01-25: 30 mg via SUBCUTANEOUS
  Filled 2018-01-25: qty 0.3

## 2018-01-25 MED ORDER — CYCLOBENZAPRINE HCL 10 MG PO TABS
10.0000 mg | ORAL_TABLET | Freq: Two times a day (BID) | ORAL | Status: DC | PRN
Start: 2018-01-25 — End: 2018-01-26

## 2018-01-25 MED ORDER — SODIUM BICARBONATE 8.4 % IV SOLN
50.0000 meq | Freq: Once | INTRAVENOUS | Status: AC
  Administered 2018-01-25: 50 meq via INTRAVENOUS
  Filled 2018-01-25: qty 50

## 2018-01-25 NOTE — Progress Notes (Signed)
 @        PROGRESS NOTE                                                                                                                                                                                                             Patient Demographics:    John Cox, is a 47 y.o. male, DOB - [birth year], ZOX:[id number]  Admit date - 01/24/2018   Admitting Physician No admitting provider for patient encounter.  Outpatient Primary MD for the patient is Patient, No Pcp Per  LOS - 0  Chief Complaint  Patient presents with  . Cough       Brief Narrative  John Cox  is a 47 y.o. male, w dm1, depression apparently presents with c/o cough w green sputum for the past several days.  Slight dyspnea, + n/v . Pt denies fever, chills, cp, palp, diarrhea, brbpr, black stool.  He stopped taking his insulin 2 days ago, he admits to heavy smoking, occasional alcohol intake, he did use cocaine a few days ago.  Was found to be in DKA along with committee acquired pneumonia and admitted to the hospital.   Subjective:    John Cox today has, No headache, No chest pain, No abdominal pain - No Nausea, No new weakness tingling or numbness, mild productive cough but no shortness of breath.   Assessment  & Plan :     1.  DKA.  In a patient with history of type 1 diabetes mellitus.  Noncompliant with insulin.  Counseled, treated with DKA protocol, gap is closed, stop insulin drip, home dose 7030 has been given and sliding scale added.  Will check A1c.  Counseled on compliance.  2.  CAP.  Follow cultures.  Clinically appears nontoxic, oral Levaquin and monitor.  3.  Smoking, cocaine abuse.  Consult to quit both.  4.  ARF.  Due to DKA resolved with hydration.  Diet :  Diet Order           Diet heart healthy/carb modified Room service appropriate? Yes; Fluid consistency: Thin  Diet effective now           Family Communication  :  None  Code Status :  Full  Disposition Plan  :  Home in  am  Consults  :  None  Procedures  :    CT chest - 1. No pulmonary nodules identified that would correspond to the left lung finding on chest radiograph. This was likely artifactual. 2. Patchy nodular airspace infiltrates are demonstrated in  the right middle lung and in the left lower lung consistent with multifocal pneumonia. 3. Mild aortic atherosclerosis. Aortic Atherosclerosis.  DVT Prophylaxis  :  Lovenox   Lab Results  Component Value Date   PLT 331 01/25/2018    Inpatient Medications  Scheduled Meds: . enoxaparin (LOVENOX) injection  30 mg Subcutaneous Daily  . feeding supplement (ENSURE ENLIVE)  237 mL Oral BID BM  . insulin aspart  0-15 Units Subcutaneous TID WC  . insulin aspart  0-5 Units Subcutaneous QHS  . insulin aspart protamine- aspart  18 Units Subcutaneous BID WC  . ipratropium-albuterol  3 mL Nebulization Q6H  . ipratropium-albuterol      . levofloxacin  500 mg Oral Q48H  . sodium polystyrene  15 g Oral Once   Continuous Infusions: . lactated ringers 75 mL/hr (01/25/18 1156)   PRN Meds:.cyclobenzaprine  Antibiotics  :    Anti-infectives (From admission, onward)   Start     Dose/Rate Route Frequency Ordered Stop   01/26/18 0600  vancomycin (VANCOCIN) 1,250 mg in sodium chloride 0.9 % 250 mL IVPB  Status:  Discontinued     1,250 mg 166.7 mL/hr over 90 Minutes Intravenous Every 24 hours 01/25/18 0657 01/25/18 0731   01/25/18 2200  levofloxacin (LEVAQUIN) tablet 500 mg     500 mg Oral Every 48 hours 01/25/18 0731     01/25/18 0730  vancomycin (VANCOCIN) 1,250 mg in sodium chloride 0.9 % 250 mL IVPB     1,250 mg 166.7 mL/hr over 90 Minutes Intravenous  Once 01/25/18 0657 01/25/18 0942   01/25/18 0730  ceFEPIme (MAXIPIME) 1 g in sodium chloride 0.9 % 100 mL IVPB  Status:  Discontinued     1 g 200 mL/hr over 30 Minutes Intravenous Every 12 hours 01/25/18 0657 01/25/18 0731   01/25/18 0130  cefTRIAXone (ROCEPHIN) 1 g in sodium chloride 0.9 % 100 mL IVPB   Status:  Discontinued     1 g 200 mL/hr over 30 Minutes Intravenous Every 24 hours 01/25/18 0121 01/25/18 0644   01/25/18 0130  azithromycin (ZITHROMAX) 500 mg in sodium chloride 0.9 % 250 mL IVPB  Status:  Discontinued     500 mg 250 mL/hr over 60 Minutes Intravenous Every 24 hours 01/25/18 0121 01/25/18 0644         Objective:   Vitals:   01/25/18 1130 01/25/18 1138 01/25/18 1200 01/25/18 1230  BP: 114/61  (!) 106/54 (!) 110/52  Pulse: 77  77 75  Resp: 20  (!) 22 (!) 21  Temp:      TempSrc:      SpO2: 96% 98% 97% 94%  Weight:      Height:        Wt Readings from Last 3 Encounters:  01/24/18 72.6 kg (160 lb)  11/13/17 74.5 kg (164 lb 3.9 oz)  03/21/16 71.2 kg (157 lb)     Intake/Output Summary (Last 24 hours) at 01/25/2018 1305 Last data filed at 01/25/2018 0942 Gross per 24 hour  Intake 2710 ml  Output 1675 ml  Net 1035 ml     Physical Exam  Awake Alert, Oriented X 3, No new F.N deficits, Normal affect [place].AT,PERRAL Supple Neck,No JVD, No cervical lymphadenopathy appriciated.  Symmetrical Chest wall movement, Good air movement bilaterally, CTAB RRR,No Gallops,Rubs or new Murmurs, No Parasternal Heave +ve B.Sounds, Abd Soft, No tenderness, No organomegaly appriciated, No rebound - guarding or rigidity. No Cyanosis, Clubbing or edema, No new Rash or bruise  Data Review:    CBC Recent Labs  Lab 01/24/18 2333 01/25/18 0428  WBC 8.4 9.1  HGB 13.9 13.0  HCT 44.5 39.9  PLT 327 331  MCV 88.6 84.7  MCH 27.7 27.6  MCHC 31.2 32.6  RDW 14.6 14.1  LYMPHSABS  --  1.2  MONOABS  --  0.9  EOSABS  --  0.0  BASOSABS  --  0.0    Chemistries  Recent Labs  Lab 01/24/18 2333 01/25/18 0137 01/25/18 0428 01/25/18 0901  NA 122* 132* 139 145  K 6.8* 4.8 4.1 3.8  CL 85* 94* 100* 105  CO2 16* 18* 23 29  GLUCOSE 1,113* 1,000* 490* 124*  BUN 33* 36* 30* 22*  CREATININE 2.42* 2.62* 1.97* 1.46*  CALCIUM 8.9 9.0 9.2 9.1    ------------------------------------------------------------------------------------------------------------------ No results for input(s): CHOL, HDL, LDLCALC, TRIG, CHOLHDL, LDLDIRECT in the last 72 hours.  Lab Results  Component Value Date   HGBA1C 11.4 (H) 01/25/2018   ------------------------------------------------------------------------------------------------------------------ No results for input(s): TSH, T4TOTAL, T3FREE, THYROIDAB in the last 72 hours.  Invalid input(s): FREET3 ------------------------------------------------------------------------------------------------------------------ No results for input(s): VITAMINB12, FOLATE, FERRITIN, TIBC, IRON, RETICCTPCT in the last 72 hours.  Coagulation profile No results for input(s): INR, PROTIME in the last 168 hours.  No results for input(s): DDIMER in the last 72 hours.  Cardiac Enzymes Recent Labs  Lab 01/25/18 0137 01/25/18 0428 01/25/18 0901  TROPONINI <0.03 <0.03 <0.03   ------------------------------------------------------------------------------------------------------------------ No results found for: BNP  Micro Results No results found for this or any previous visit (from the past 240 hour(s)).  Radiology Reports Dg Chest 2 View  Result Date: 01/25/2018 CLINICAL DATA:  Acute onset of productive cough and lethargy. EXAM: CHEST - 2 VIEW COMPARISON:  Chest radiograph performed 11/13/2017 FINDINGS: The lungs are well-aerated.  Mild peribronchial thickening is noted. There is question of a 1.5 cm nodule at the left midlung zone. There is no evidence of pleural effusion or pneumothorax. The heart is normal in size; the mediastinal contour is within normal limits. No acute osseous abnormalities are seen. IMPRESSION: 1. Question of 1.5 cm nodule at the left midlung zone, not well seen in March. CT of the chest would be helpful for further evaluation, when and as deemed clinically appropriate. 2. Mild  peribronchial thickening noted. Electronically Signed   By: Roanna Raider M.D.   On: 01/25/2018 00:02   Ct Chest Wo Contrast  Result Date: 01/25/2018 CLINICAL DATA:  Cough. Chest radiograph 01/24/2018 indicated possible left mid lung nodule. EXAM: CT CHEST WITHOUT CONTRAST TECHNIQUE: Multidetector CT imaging of the chest was performed following the standard protocol without IV contrast. COMPARISON:  Chest radiograph 01/24/2018 FINDINGS: Cardiovascular: Evaluation of vascular structures is limited without IV contrast material. Heart size is normal. No pericardial effusion. Calcification in the aorta. No aortic aneurysm. Mediastinum/Nodes: Thyroid gland is unremarkable. Esophagus is decompressed. Scattered lymph nodes in the mediastinum without pathologic enlargement. No mediastinal fluid collections. Lungs/Pleura: Patchy nodular airspace infiltrates demonstrated in the right middle lung and in the left lung base consistent with multifocal pneumonia. No specific nodules identified in the left mid lung that would correspond to the chest x-ray abnormality. The radiographic appearance was likely due to artifact. No pleural effusions. No pneumothorax. Airways are patent. Upper Abdomen: No acute abnormality. Musculoskeletal: No chest wall mass or suspicious bone lesions identified. IMPRESSION: 1. No pulmonary nodules identified that would correspond to the left lung finding on chest radiograph. This was likely artifactual. 2. Patchy nodular airspace infiltrates are  demonstrated in the right middle lung and in the left lower lung consistent with multifocal pneumonia. 3. Mild aortic atherosclerosis. Aortic Atherosclerosis (ICD10-I70.0). Electronically Signed   By: Burman Nieves M.D.   On: 01/25/2018 03:24    Time Spent in minutes  30   Susa Raring M.D on 01/25/2018 at 1:05 PM  Between 7am to 7pm - Pager - [phone] ( page via [email or web address], text pages only, please mention full 10 digit call back number).  After 7pm go to [email or web address] - password [facility]

## 2018-01-25 NOTE — ED Notes (Signed)
Received report from Nurse Maggie. Per Thedore Mins MD phone call, plans are to discontinue insulin drip and dextrose at 12 pm and then begin the LR at that time.

## 2018-01-25 NOTE — ED Provider Notes (Signed)
[facility] EMERGENCY DEPARTMENT Provider Note   CSN: [id number] Arrival date & time: 01/24/18  2255     History   Chief Complaint Chief Complaint  Patient presents with  . Cough    HPI John Cox is a 47 y.o. male.  HPI Patient is a 47 year old male presents the emergency department productive cough with green sputum over the past week.  He reports positive nausea vomiting.  He has not had his diabetic meds for over a week.  He presents with a blood sugar greater than 600.  He feels weak and tired.  He presents with a heart rate of 120.  No fevers or chills.  No significant shortness of breath.  Reports he feels weak and drained at this time.  Symptoms are moderate to severe in severity   Past Medical History:  Diagnosis Date  . Depression 2000  . Diabetes mellitus 1995  . Nausea & vomiting   . Polysubstance abuse (HCC) 2012   relapse 2 wk ago   . Suicidal ideation     Patient Active Problem List   Diagnosis Date Noted  . Hypoglycemia due to insulin 11/13/2017  . Hypoglycemia 11/13/2017  . Malnutrition of moderate degree 07/19/2015  . DKA, type 2, not at goal [facility]) 07/18/2015  . Hyperkalemia 07/18/2015  . AKI (acute kidney injury) (HCC) 07/18/2015  . Nausea vomiting and diarrhea 07/18/2015  . Marijuana abuse 07/18/2015  . Dehydration 07/18/2015  . Dental caries 07/18/2015  . Hyperlipidemia associated with type 2 diabetes mellitus (HCC) 05/24/2014  . Polysubstance abuse (HCC) 05/13/2014  . Major depression, recurrent (HCC) 05/13/2014  . Suicidal ideations 05/13/2014  . ANTICARDIOLIPIN ANTIBODY SYNDROME 05/12/2007  . Type 1 diabetes mellitus (HCC) 04/26/2007  . TOBACCO ABUSE 04/26/2007  . 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 No pertinent surgical history.      Home Medications    Prior to Admission medications   Medication Sig Start Date End Date Taking? Authorizing Provider  cyclobenzaprine (FLEXERIL) 10 MG  tablet Take 1 tablet (10 mg total) by mouth 2 (two) times daily as needed for muscle spasms. 09/29/16   Dorena Bodo, NP  feeding supplement, ENSURE ENLIVE, (ENSURE ENLIVE) LIQD Take 237 mLs by mouth 2 (two) times daily between meals. 11/14/17   Rolly Salter, MD  insulin NPH-regular Human (NOVOLIN 70/30) (70-30) 100 UNIT/ML injection Inject 20 Units into the skin 2 (two) times daily with a meal. 11/15/17   Rolly Salter, MD  insulin regular (NOVOLIN R,HUMULIN R) 100 units/mL injection If BG 70 - 120: use 0 units, BG 121-150: 2 units,  BG 151-200: 3 units, BG 201-250: 5 units, BG 251-300: 8 units, BG 301-350: 11 units, BG 351 - 400: 15 units. 11/14/17 11/14/18  Rolly Salter, MD  Insulin Syringe-Needle U-100 (SAFETY-GLIDE 0.3CC SYR 29GX1/2) 29G X 1/2" 0.3 ML MISC Use as directed. 05/18/14   Hongalgi, Maximino Greenland, MD  meloxicam (MOBIC) 15 MG tablet Take 1 tablet (15 mg total) by mouth daily. 09/29/16   Dorena Bodo, NP  nicotine (NICODERM CQ - DOSED IN MG/24 HOURS) 21 mg/24hr patch Place 1 patch (21 mg total) onto the skin daily. 11/15/17   Rolly Salter, MD  protein supplement shake (PREMIER PROTEIN) LIQD Take 325 mLs (11 oz total) by mouth 2 (two) times daily between meals. 11/15/17   Rolly Salter, MD    Family History Family History  Problem Relation Age of Onset  . Diabetes Mother   .  COPD Mother   . Heart disease Mother     Social History Social History   Tobacco Use  . Smoking status: Current Every Day Smoker    Packs/day: 0.00  . Smokeless tobacco: Never Used  Substance Use Topics  . Alcohol use: Yes  . Drug use: Yes    Types: Marijuana, Cocaine     Allergies   Patient has no known allergies.   Review of Systems Review of Systems  All other systems reviewed and are negative.    Physical Exam Updated Vital Signs BP (!) 156/119   Pulse (!) 120   Temp 98.3 F (36.8 C) (Oral)   Resp (!) 22   Ht  (1.778 m)   Wt 72.6 kg (160 lb)   SpO2 94%   BMI 22.96  kg/m   Physical Exam  Constitutional: He is oriented to person, place, and time. He appears well-developed and well-nourished.  HENT:  Head: Normocephalic and atraumatic.  Eyes: EOM are normal.  Neck: Normal range of motion.  Cardiovascular: Regular rhythm, normal heart sounds and intact distal pulses.  Tachycardia  Pulmonary/Chest: Effort normal and breath sounds normal. No respiratory distress.  Abdominal: Soft. He exhibits no distension. There is no tenderness.  Musculoskeletal: Normal range of motion.  Neurological: He is alert and oriented to person, place, and time.  Skin: Skin is warm and dry.  Psychiatric: He has a normal mood and affect. Judgment normal.  Nursing note and vitals reviewed.    ED Treatments / Results  Labs (all labs ordered are listed, but only abnormal results are displayed) Labs Reviewed  BASIC METABOLIC PANEL - Abnormal; Notable for the following components:      Result Value   Sodium 122 (*)    Potassium 6.8 (*)    Chloride 85 (*)    CO2 16 (*)    Glucose, Bld 1,113 (*)    BUN 33 (*)    Creatinine, Ser 2.42 (*)    GFR calc non Af Amer 30 (*)    GFR calc Af Amer 35 (*)    Anion gap 21 (*)    All other components within normal limits  URINALYSIS, ROUTINE W REFLEX MICROSCOPIC - Abnormal; Notable for the following components:   Color, Urine STRAW (*)    Glucose, UA >=500 (*)    Ketones, ur 20 (*)    All other components within normal limits  CBG MONITORING, ED - Abnormal; Notable for the following components:   Glucose-Capillary >600 (*)    All other components within normal limits  I-STAT CG4 LACTIC ACID, ED - Abnormal; Notable for the following components:   Lactic Acid, Venous 2.03 (*)    All other components within normal limits  I-STAT VENOUS BLOOD GAS, ED - Abnormal; Notable for the following components:   pH, Ven 7.248 (*)    pCO2, Ven 41.8 (*)    pO2, Ven 47.0 (*)    Bicarbonate 18.2 (*)    TCO2 19 (*)    Acid-base deficit 9.0 (*)     All other components within normal limits  CBC    EKG EKG Interpretation  Date/Time:  Sunday Jan 24 2018 23:11:01 EDT Ventricular Rate:  122 PR Interval:  134 QRS Duration: 92 QT Interval:  308 QTC Calculation: 438 R Axis:   63 Text Interpretation:  Sinus tachycardia Right atrial enlargement Left ventricular hypertrophy Abnormal ECG peaking T waves throughout, worse than prior ecg Confirmed by Azalia Bilis ([id number]) on 01/24/2018 11:55:29 PM  Radiology Dg Chest 2 View  Result Date: 01/25/2018 CLINICAL DATA:  Acute onset of productive cough and lethargy. EXAM: CHEST - 2 VIEW COMPARISON:  Chest radiograph performed 11/13/2017 FINDINGS: The lungs are well-aerated.  Mild peribronchial thickening is noted. There is question of a 1.5 cm nodule at the left midlung zone. There is no evidence of pleural effusion or pneumothorax. The heart is normal in size; the mediastinal contour is within normal limits. No acute osseous abnormalities are seen. IMPRESSION: 1. Question of 1.5 cm nodule at the left midlung zone, not well seen in March. CT of the chest would be helpful for further evaluation, when and as deemed clinically appropriate. 2. Mild peribronchial thickening noted. Electronically Signed   By: Roanna Raider M.D.   On: 01/25/2018 00:02    Procedures .Critical Care Performed by: Azalia Bilis, MD Authorized by: Azalia Bilis, MD     CRITICAL CARE Performed by: Azalia Bilis Total critical care time: 35 minutes Critical care time was exclusive of separately billable procedures and treating other patients. Critical care was necessary to treat or prevent imminent or life-threatening deterioration. Critical care was time spent personally by me on the following activities: development of treatment plan with patient and/or surrogate as well as nursing, discussions with consultants, evaluation of patient's response to treatment, examination of patient, obtaining history from patient or  surrogate, ordering and performing treatments and interventions, ordering and review of laboratory studies, ordering and review of radiographic studies, pulse oximetry and re-evaluation of patient's condition.   Medications Ordered in ED Medications  insulin aspart (novoLOG) injection 14 Units (has no administration in time range)  sodium chloride 0.9 % bolus 2,000 mL (has no administration in time range)  albuterol (PROVENTIL HFA;VENTOLIN HFA) 108 (90 Base) MCG/ACT inhaler 2 puff (has no administration in time range)  calcium gluconate 1 g in sodium chloride 0.9 % 100 mL IVPB (has no administration in time range)  dextrose 5 %-0.45 % sodium chloride infusion (has no administration in time range)  insulin regular (NOVOLIN R,HUMULIN R) 100 Units in sodium chloride 0.9 % 100 mL (1 Units/mL) infusion (has no administration in time range)  dextrose 50 % solution 25 mL (has no administration in time range)  0.9 %  sodium chloride infusion (has no administration in time range)     Initial Impression / Assessment and Plan / ED Course  I have reviewed the triage vital signs and the nursing notes.  Pertinent labs & imaging results that were available during my care of the patient were reviewed by me and considered in my medical decision making (see chart for details).     Patient ill-appearing on arrival.  Peak T waves concerning for hyperkalemia in the setting of dehydration.  Early IV calcium.  Blood sugar found to be greater than 600.  K returned at 6.8.  Will be started on insulin drip at this time.  Has Artie received calcium.  2 L IV fluids.  Blood sugar greater than 1000.  Acute kidney injury present.  Pseudohyponatremia.  Chest x-ray without obvious pneumonia.  Patient will be admitted to the stepdown unit.  BUN  Date Value Ref Range Status  01/24/2018 33 (H) 6 - 20 mg/dL Final  40/98/1191 24 (H) 6 - 20 mg/dL Final  47/82/9562 22 (H) 6 - 20 mg/dL Final  13/04/6577 21 (H) 6 - 20 mg/dL  Final   Creatinine, Ser  Date Value Ref Range Status  01/24/2018 2.42 (H) 0.61 - 1.24 mg/dL Final  11/14/2017 1.60 (H) 0.61 - 1.24 mg/dL Final  16/06/9603 5.40 (H) 0.61 - 1.24 mg/dL Final  98/07/9146 8.29 (H) 0.61 - 1.24 mg/dL Final      Final Clinical Impressions(s) / ED Diagnoses   Final diagnoses:  Acute kidney injury [facility])  Hyperkalemia  Hyperglycemia    ED Discharge Orders    None       Azalia Bilis, MD 01/25/18 [phone]

## 2018-01-25 NOTE — Progress Notes (Signed)
Paged Dr. Thedore Mins, pt has throbbing headache 8/10.  Pt was given 500 mg tylenol in ED ineffective.  May he something for headache?

## 2018-01-25 NOTE — Progress Notes (Signed)
Dr. Thedore Mins returned page.  T/O give tylenol 650 mg po X 1 dose, give motrin 400 mg po  X 1 dose.

## 2018-01-25 NOTE — Progress Notes (Signed)
Pharmacy Antibiotic Note  John Cox is a 47 y.o. male admitted on 01/24/2018 with pneumonia.  Pharmacy has been consulted for Vancomycin and Cefepime dosing.  Plan: Vancomycin 1250 mg IV q24h Cefepime 1 g IV q12h  Height:  (177.8 cm) Weight: 160 lb (72.6 kg) IBW/kg (Calculated) : 73  Temp (24hrs), Avg:98.5 F (36.9 C), Min:98.3 F (36.8 C), Max:98.7 F (37.1 C)  Recent Labs  Lab 01/24/18 2333 01/25/18 0012 01/25/18 0137 01/25/18 0428  WBC 8.4  --   --  9.1  CREATININE 2.42*  --  2.62* 1.97*  LATICACIDVEN  --  2.03*  --   --     Estimated Creatinine Clearance: 47.6 mL/min (A) (by C-G formula based on SCr of 1.97 mg/dL (H)).    No Known Allergies   [name] 01/25/2018 6:51 AM

## 2018-01-25 NOTE — ED Notes (Signed)
Singh paged for medication for patients headache.

## 2018-01-25 NOTE — Progress Notes (Signed)
Pt admitted to room 06 via stretcher from ED.  Pt alert oriented x 4.  VSS.  Pt complained of headache but was given Tylenol in ED.  Pleasant.  Tele SR.

## 2018-01-25 NOTE — H&P (Addendum)
TRH H&P   Patient Demographics:    John Cox, is a 47 y.o. male  MRN: [id number]   DOB - 09/02/1970  Admit Date - 01/24/2018  Outpatient Primary MD for the patient is Patient, No Pcp Per  Referring MD/NP/PA: Azalia Bilis  Outpatient Specialists:     Patient coming from: home  Chief Complaint  Patient presents with  . Cough      HPI:    John Cox  is a 47 y.o. male, w dm1, depression apparently presents with c/o cough w green sputum for the past several days.  Slight dyspnea, + n/v . Pt denies fever, chills, cp, palp, diarrhea, brbpr, black stool.      In Ed,  CXR=> IMPRESSION: 1. Question of 1.5 cm nodule at the left midlung zone, not well seen in March. CT of the chest would be helpful for further evaluation, when and as deemed clinically appropriate. 2. Mild peribronchial thickening noted.  PH 7.248 pCo2 41.8 Lactic acid 2.03  Urinalysis + ketones  Wbc 8.4, Hgb 13.9, Plt 327  Na 122, K 6.8, Bun 33, Creatinine 2.42  Glucose 1,113 AG =21, Hco3=16  Pt will be admitted for DKA, ARF, hyperkalemia and pseudohyponatremia.              Review of systems:    In addition to the HPI above,  No Fever-chills, No Headache, No changes with Vision or hearing, No problems swallowing food or Liquids, No Chest pain, No Abdominal pain, Bowel movements are regular, No Blood in stool or Urine, No dysuria, No new skin rashes or bruises, No new joints pains-aches,  No new weakness, tingling, numbness in any extremity, No recent weight gain or loss,  No significant Mental Stressors.  A full 10 point Review of Systems was done, except as stated above, all other Review of Systems were negative.   With Past History of the following :    Past Medical History:  Diagnosis Date  . Depression 2000  . Diabetes mellitus 1995  . Nausea & vomiting     . Polysubstance abuse (HCC) 2012   relapse 2 wk ago   . Suicidal ideation       History reviewed. No pertinent surgical history. None   Social History:     Social History   Tobacco Use  . Smoking status: Current Every Day Smoker    Packs/day: 0.00  . Smokeless tobacco: Never Used  Substance Use Topics  . Alcohol use: Yes     Lives - at home  Mobility - walks by self   Family History :     Family History  Problem Relation Age of Onset  . Diabetes Mother   . COPD Mother   . Heart disease Mother        Home Medications:   Prior to Admission medications   Medication Sig Start Date End Date  Taking? Authorizing Provider  cyclobenzaprine (FLEXERIL) 10 MG tablet Take 1 tablet (10 mg total) by mouth 2 (two) times daily as needed for muscle spasms. 09/29/16   Dorena Bodo, NP  feeding supplement, ENSURE ENLIVE, (ENSURE ENLIVE) LIQD Take 237 mLs by mouth 2 (two) times daily between meals. 11/14/17   Rolly Salter, MD  insulin NPH-regular Human (NOVOLIN 70/30) (70-30) 100 UNIT/ML injection Inject 20 Units into the skin 2 (two) times daily with a meal. 11/15/17   Rolly Salter, MD  insulin regular (NOVOLIN R,HUMULIN R) 100 units/mL injection If BG 70 - 120: use 0 units, BG 121-150: 2 units,  BG 151-200: 3 units, BG 201-250: 5 units, BG 251-300: 8 units, BG 301-350: 11 units, BG 351 - 400: 15 units. 11/14/17 11/14/18  Rolly Salter, MD  Insulin Syringe-Needle U-100 (SAFETY-GLIDE 0.3CC SYR 29GX1/2) 29G X 1/2" 0.3 ML MISC Use as directed. 05/18/14   Hongalgi, Maximino Greenland, MD  meloxicam (MOBIC) 15 MG tablet Take 1 tablet (15 mg total) by mouth daily. 09/29/16   Dorena Bodo, NP  nicotine (NICODERM CQ - DOSED IN MG/24 HOURS) 21 mg/24hr patch Place 1 patch (21 mg total) onto the skin daily. 11/15/17   Rolly Salter, MD  protein supplement shake (PREMIER PROTEIN) LIQD Take 325 mLs (11 oz total) by mouth 2 (two) times daily between meals. 11/15/17   Rolly Salter, MD      Allergies:    No Known Allergies   Physical Exam:   Vitals  Blood pressure (!) 156/119, pulse (!) 120, temperature 98.3 F (36.8 C), temperature source Oral, resp. rate (!) 22, height  (1.778 m), weight 72.6 kg (160 lb), SpO2 94 %.   1. General lying in bed in NAD,   2. Normal affect and insight, Not Suicidal or Homicidal, Awake Alert, Oriented X 3.  3. No F.N deficits, ALL C.Nerves Intact, Strength 5/5 all 4 extremities, Sensation intact all 4 extremities, Plantars down going.  4. Ears and Eyes appear Normal, Conjunctivae clear, PERRLA. Moist Oral Mucosa.  5. Supple Neck, No JVD, No cervical lymphadenopathy appriciated, No Carotid Bruits.  6. Symmetrical Chest wall movement, Good air movement bilaterally, CTAB.  7. RRR, No Gallops, Rubs or Murmurs, No Parasternal Heave.  8. Positive Bowel Sounds, Abdomen Soft, No tenderness, No organomegaly appriciated,No rebound -guarding or rigidity.  9.  No Cyanosis, Normal Skin Turgor, No Skin Rash or Bruise.  10. Good muscle tone,  joints appear normal , no effusions, Normal ROM.  11. No Palpable Lymph Nodes in Neck or Axillae      Data Review:    CBC Recent Labs  Lab 01/24/18 2333  WBC 8.4  HGB 13.9  HCT 44.5  PLT 327  MCV 88.6  MCH 27.7  MCHC 31.2  RDW 14.6   ------------------------------------------------------------------------------------------------------------------  Chemistries  Recent Labs  Lab 01/24/18 2333  NA 122*  K 6.8*  CL 85*  CO2 16*  GLUCOSE 1,113*  BUN 33*  CREATININE 2.42*  CALCIUM 8.9   ------------------------------------------------------------------------------------------------------------------ estimated creatinine clearance is 38.8 mL/min (A) (by C-G formula based on SCr of 2.42 mg/dL (H)). ------------------------------------------------------------------------------------------------------------------ No results for input(s): TSH, T4TOTAL, T3FREE, THYROIDAB in the last 72  hours.  Invalid input(s): FREET3  Coagulation profile No results for input(s): INR, PROTIME in the last 168 hours. ------------------------------------------------------------------------------------------------------------------- No results for input(s): DDIMER in the last 72 hours. -------------------------------------------------------------------------------------------------------------------  Cardiac Enzymes No results for input(s): CKMB, TROPONINI, MYOGLOBIN in the last 168 hours.  Invalid input(s):  CK ------------------------------------------------------------------------------------------------------------------ No results found for: BNP   ---------------------------------------------------------------------------------------------------------------  Urinalysis    Component Value Date/Time   COLORURINE STRAW (A) 01/24/2018 2341   APPEARANCEUR CLEAR 01/24/2018 2341   LABSPEC 1.024 01/24/2018 2341   PHURINE 5.0 01/24/2018 2341   GLUCOSEU >=500 (A) 01/24/2018 2341   HGBUR NEGATIVE 01/24/2018 2341   HGBUR negative 05/12/2007 1355   BILIRUBINUR NEGATIVE 01/24/2018 2341   BILIRUBINUR negative 05/23/2014 1052   KETONESUR 20 (A) 01/24/2018 2341   PROTEINUR NEGATIVE 01/24/2018 2341   UROBILINOGEN 0.2 10/04/2014 1749   NITRITE NEGATIVE 01/24/2018 2341   LEUKOCYTESUR NEGATIVE 01/24/2018 2341    ----------------------------------------------------------------------------------------------------------------   Imaging Results:    Dg Chest 2 View  Result Date: 01/25/2018 CLINICAL DATA:  Acute onset of productive cough and lethargy. EXAM: CHEST - 2 VIEW COMPARISON:  Chest radiograph performed 11/13/2017 FINDINGS: The lungs are well-aerated.  Mild peribronchial thickening is noted. There is question of a 1.5 cm nodule at the left midlung zone. There is no evidence of pleural effusion or pneumothorax. The heart is normal in size; the mediastinal contour is within normal limits.  No acute osseous abnormalities are seen. IMPRESSION: 1. Question of 1.5 cm nodule at the left midlung zone, not well seen in March. CT of the chest would be helpful for further evaluation, when and as deemed clinically appropriate. 2. Mild peribronchial thickening noted. Electronically Signed   By: Roanna Raider M.D.   On: 01/25/2018 00:02      Assessment & Plan:    Principal Problem:   DKA (diabetic ketoacidoses) (HCC) Active Problems:   Type 1 diabetes mellitus (HCC)   Hyperkalemia   AKI (acute kidney injury) (HCC)   Tachycardia    DKA Ns iv Insulin iv Bmp q4h x5  Hyperkalemia Sodium bicarb iv x1 calclium gluconate 1 amp iv x1 Kayexalate 15gm po x1 Check bmp in 4 hours  ARF Hydrate with ns iv Pt instructed to Avoid NSAIDS and IV Dye  STOP MELOXICAM Check cmp in am  Tachycardia , likely due to dehydration Trop I q4hx3 Consider cardiac echo if persistent  Lung nodule CT chest noncontrast  Cough Rocephin 1gm iv qday Zithromax  iv qday  DVT Prophylaxis Lovenox - SCDs  AM Labs Ordered, also please review Full Orders  Family Communication: Admission, patients condition and plan of care including tests being ordered have been discussed with the patient who indicate understanding and agree with the plan and Code Status.  Code Status FULL CODE  Likely DC to  home  Condition GUARDED    Consults called: none  Admission status: inpatient   Time spent in minutes : critical care 45 minutes   Pearson Grippe M.D on 01/25/2018 at 12:57 AM  Between 7am to 7pm - Pager - [phone] After 7pm go to [email or web address] - password [facility]  [facility] - Office  [phone]

## 2018-01-25 NOTE — ED Notes (Signed)
Will hold kayexalate at this time as pt reports feeling dizzy.  Pt states he would prefer not to take medication at this time as he does not want to have a BM in the bed and he is too dizzy to continuously get up to the bedside commode.  Will inform MD.

## 2018-01-26 DIAGNOSIS — J181 Lobar pneumonia, unspecified organism: Secondary | ICD-10-CM

## 2018-01-26 LAB — BASIC METABOLIC PANEL
ANION GAP: 6 (ref 5–15)
BUN: 15 mg/dL (ref 6–20)
CALCIUM: 8.8 mg/dL — AB (ref 8.9–10.3)
CO2: 32 mmol/L (ref 22–32)
Chloride: 105 mmol/L (ref 101–111)
Creatinine, Ser: 1.16 mg/dL (ref 0.61–1.24)
GFR calc non Af Amer: >60 mL/min (ref >60)
Glucose, Bld: 112 mg/dL — ABNORMAL HIGH (ref 65–99)
Potassium: 3.7 mmol/L (ref 3.5–5.1)
Sodium: 143 mmol/L (ref 135–145)

## 2018-01-26 LAB — GLUCOSE, CAPILLARY: GLUCOSE-CAPILLARY: 143 mg/dL — AB (ref 65–99)

## 2018-01-26 LAB — LEGIONELLA PNEUMOPHILA SEROGP 1 UR AG: L. PNEUMOPHILA SEROGP 1 UR AG: NEGATIVE

## 2018-01-26 LAB — MAGNESIUM: Magnesium: 1.8 mg/dL (ref 1.7–2.4)

## 2018-01-26 MED ORDER — LEVOFLOXACIN 750 MG PO TABS: 750.0000 mg | ORAL_TABLET | Freq: Every day | ORAL | 0 refills | Status: DC

## 2018-01-26 MED ORDER — INSULIN REGULAR HUMAN 100 UNIT/ML IJ SOLN: INTRAMUSCULAR | 0 refills | Status: DC

## 2018-01-26 MED ORDER — INSULIN NPH ISOPHANE & REGULAR (70-30) 100 UNIT/ML ~~LOC~~ SUSP: 15.0000 [IU] | Freq: Two times a day (BID) | SUBCUTANEOUS | 0 refills | Status: DC

## 2018-01-26 NOTE — Progress Notes (Signed)
John Cox to be D/C'd home per MD order.  Discussed prescriptions and follow up appointments with the patient. Prescriptions given to patient, medication list explained in detail. Pt verbalized understanding.  Allergies as of 01/26/2018   No Known Allergies     Medication List    TAKE these medications   cyclobenzaprine 10 MG tablet Commonly known as:  FLEXERIL Take 1 tablet (10 mg total) by mouth 2 (two) times daily as needed for muscle spasms.   feeding supplement (ENSURE ENLIVE) Liqd Take 237 mLs by mouth 2 (two) times daily between meals.   insulin NPH-regular Human (70-30) 100 UNIT/ML injection Commonly known as:  NOVOLIN 70/30 Inject 15 Units into the skin 2 (two) times daily with a meal. Kindly dispense 1 month supply either while with needles and syringes or insulin pen if qualifies or cheaper What changed:    how much to take  additional instructions   insulin regular 100 units/mL injection Commonly known as:  NOVOLIN R,HUMULIN R If BG 70 - 120: use 0 units, BG 121-150: 2 units,  BG 151-200: 3 units, BG 201-250: 5 units, BG 251-300: 8 units, BG 301-350: 11 units, BG 351 - 400: 15 units.  Kindly dispense one month supply, syringes and needles as needed, pen if qualifies are cheaper. What changed:  additional instructions   Insulin Syringe-Needle U-100 29G X 1/2" 0.3 ML Misc Commonly known as:  SAFETY-GLIDE 0.3CC SYR 29GX1/2 Use as directed.   levofloxacin 750 MG tablet Commonly known as:  LEVAQUIN Take 1 tablet (750 mg total) by mouth daily.   meloxicam 15 MG tablet Commonly known as:  MOBIC Take 1 tablet (15 mg total) by mouth daily.   nicotine 21 mg/24hr patch Commonly known as:  NICODERM CQ - dosed in mg/24 hours Place 1 patch (21 mg total) onto the skin daily.   protein supplement shake Liqd Commonly known as:  PREMIER PROTEIN Take 325 mLs (11 oz total) by mouth 2 (two) times daily between meals.       Vitals:   01/26/18 0907 01/26/18 1031   BP:  (!) 103/59  Pulse: 63 75  Resp: 18 16  Temp:  98.5 F (36.9 C)  SpO2: 94% 100%    Skin clean, dry and intact without evidence of skin break down, no evidence of skin tears noted. IV catheter discontinued intact. Site without signs and symptoms of complications. Dressing and pressure applied. Pt denies pain at this time. No complaints noted.  An After Visit Summary was printed and given to the patient. Patient escorted via WC, and D/C home via private auto.

## 2018-01-26 NOTE — Discharge Instructions (Signed)
Follow with Primary MD in 7 days   Get CBC, CMP, 2 view Chest X ray checked  by Primary MD  in 5-7 days   Activity: As tolerated with Full fall precautions use walker/cane & assistance as needed  Disposition Home   Diet:   Low carbohydrate  Accuchecks 4 times/day, Once in AM empty stomach and then before each meal. Log in all results and show them to your Prim.MD in 3 days. If any glucose reading is under 80 or above 300 call your Prim MD immidiately. Follow Low glucose instructions for glucose under 80 as instructed.  Special Instructions: If you have smoked or chewed Tobacco  in the last 2 yrs please stop smoking, stop any regular Alcohol  and or any Recreational drug use.  On your next visit with your primary care physician please Get Medicines reviewed and adjusted.  Please request your Prim.MD to go over all Hospital Tests and Procedure/Radiological results at the follow up, please get all Hospital records sent to your Prim MD by signing hospital release before you go home.  If you experience worsening of your admission symptoms, develop shortness of breath, life threatening emergency, suicidal or homicidal thoughts you must seek medical attention immediately by calling 911 or calling your MD immediately  if symptoms less severe.  You Must read complete instructions/literature along with all the possible adverse reactions/side effects for all the Medicines you take and that have been prescribed to you. Take any new Medicines after you have completely understood and accpet all the possible adverse reactions/side effects.   Do not drive, operate heavy machinery, perform activities at heights, swimming or participation in water activities or provide baby sitting services if your were admitted for syncope or siezures until you have seen by Primary MD or a Neurologist and advised to do so again.  Do not drive when taking Pain medications.    Do not take more than prescribed Pain, Sleep  and Anxiety Medications  Wear Seat belts while driving.   Please note  You were cared for by a hospitalist during your hospital stay. If you have any questions about your discharge medications or the care you received while you were in the hospital after you are discharged, you can call the unit and asked to speak with the hospitalist on call if the hospitalist that took care of you is not available. Once you are discharged, your primary care physician will handle any further medical issues. Please note that NO REFILLS for any discharge medications will be authorized once you are discharged, as it is imperative that you return to your primary care physician (or establish a relationship with a primary care physician if you do not have one) for your aftercare needs so that they can reassess your need for medications and monitor your lab values.

## 2018-01-26 NOTE — Care Management Note (Addendum)
Case Management Note  Patient Details  Name: John Cox MRN: [id number] Date of Birth: Nov 07, 1970  Subjective/Objective:    Admitted for Diabetic Ketoacidosis. No PCP noted.         Action/Plan: Patient with discharge orders home today.  NCM received referral for medication assistance, patient uninsured.  MATCH Program Letter provided to patient and explained Match program policy of usage once every 12 months.  Patient verbalized understanding.  Provider for uninsured patients to establish Primary Care Provider provided for patient, see AVS.  Expected Discharge Date:    01/26/2018              Expected Discharge Plan:  Home/Self Care  In-House Referral:   N/A  Discharge planning Services  CM Consult, Medication Assistance, MATCH Program, [facility]  Status of Service:  Completed, signed off  Yancey Flemings, [place] 01/26/2018, 9:40 AM

## 2018-01-26 NOTE — Discharge Summary (Signed)
[name]:[id number] DOB: November 25, 1970 DOA: 01/24/2018  PCP: Patient, No Pcp Per  Admit date: 01/24/2018  Discharge date: 01/26/2018  Admitted From: Home   Disposition:  Home   Recommendations for Outpatient Follow-up:   Follow up with PCP in 1-2 weeks  PCP Please obtain BMP/CBC, 2 view CXR in 1week,  (see Discharge instructions)   PCP Please follow up on the following pending results:    Home Health: None   Equipment/Devices: None  Consultations: None Discharge Condition: Stable   CODE STATUS: Full   Diet Recommendation: Low carbohydrate   Chief Complaint  Patient presents with  . Cough     Brief history of present illness from the day of admission and additional interim summary    WilliamMooreis a46 y.o.male,w dm1, depression apparently presents withc/o cough w green sputum for the past several days. Slight dyspnea, + n/v . Pt denies fever, chills, cp, palp, diarrhea, brbpr, black stool.  He stopped taking his insulin 2 days ago, he admits to heavy smoking, occasional alcohol intake, he did use cocaine a few days ago.  Was found to be in DKA along with committee acquired pneumonia and admitted to the hospital.                                                                 Hospital Course    1.  DKA.  In a patient with history of type 1 diabetes mellitus.  Noncompliant with insulin.  A1c greater than 11, Counseled, treated with DKA protocol, gap closed nicely and he was stable on home dose insulin regimen, refills provided and case management requested to assist the patient for the same, again counseled on diet and medication compliance.  2.  CAP.  Far cultures negative, clinically stable on room air, still has mild cough but largely nonproductive, will give 4 more days of oral Levaquin  upon discharge PCP to check CBC, BMP and a two-view chest x-ray next visit.  3.  Smoking, cocaine abuse.  Consult to quit both.  4.  ARF.  Due to DKA resolved with hydration.     Discharge diagnosis     Principal Problem:   DKA (diabetic ketoacidoses) (HCC) Active Problems:   Type 1 diabetes mellitus (HCC)   Hyperkalemia   Acute kidney injury (HCC)   Tachycardia   CAP (community acquired pneumonia)    Discharge instructions    Discharge Instructions    Discharge instructions   Complete by:  As directed    Follow with Primary MD in 7 days   Get CBC, CMP, 2 view Chest X ray checked  by Primary MD  in 5-7 days   Activity: As tolerated with Full fall precautions use walker/cane & assistance as needed  Disposition Home   Diet:   Low carbohydrate  Accuchecks 4 times/day, Once in AM empty stomach and then before each meal. Log in all results and show them to your Prim.MD in 3 days. If any glucose reading is under 80 or above 300 call your Prim MD immidiately. Follow Low glucose instructions for glucose under 80 as instructed.  Special Instructions: If you have smoked or chewed Tobacco  in the last 2 yrs please stop smoking, stop any regular Alcohol  and or any Recreational drug use.  On your next visit with your primary care physician please Get Medicines reviewed and adjusted.  Please request your Prim.MD to go over all Hospital Tests and Procedure/Radiological results at the follow up, please get all Hospital records sent to your Prim MD by signing hospital release before you go home.  If you experience worsening of your admission symptoms, develop shortness of breath, life threatening emergency, suicidal or homicidal thoughts you must seek medical attention immediately by calling 911 or calling your MD immediately  if symptoms less severe.  You Must read complete instructions/literature along with all the possible adverse reactions/side effects for all the Medicines  you take and that have been prescribed to you. Take any new Medicines after you have completely understood and accpet all the possible adverse reactions/side effects.   Do not drive, operate heavy machinery, perform activities at heights, swimming or participation in water activities or provide baby sitting services if your were admitted for syncope or siezures until you have seen by Primary MD or a Neurologist and advised to do so again.  Do not drive when taking Pain medications.    Do not take more than prescribed Pain, Sleep and Anxiety Medications  Wear Seat belts while driving.   Please note  You were cared for by a hospitalist during your hospital stay. If you have any questions about your discharge medications or the care you received while you were in the hospital after you are discharged, you can call the unit and asked to speak with the hospitalist on call if the hospitalist that took care of you is not available. Once you are discharged, your primary care physician will handle any further medical issues. Please note that NO REFILLS for any discharge medications will be authorized once you are discharged, as it is imperative that you return to your primary care physician (or establish a relationship with a primary care physician if you do not have one) for your aftercare needs so that they can reassess your need for medications and monitor your lab values.   Increase activity slowly   Complete by:  As directed       Discharge Medications   Allergies as of 01/26/2018   No Known Allergies     Medication List    TAKE these medications   cyclobenzaprine 10 MG tablet Commonly known as:  FLEXERIL Take 1 tablet (10 mg total) by mouth 2 (two) times daily as needed for muscle spasms.   feeding supplement (ENSURE ENLIVE) Liqd Take 237 mLs by mouth 2 (two) times daily between meals.   insulin NPH-regular Human (70-30) 100 UNIT/ML injection Commonly known as:  NOVOLIN 70/30 Inject  15 Units into the skin 2 (two) times daily with a meal. Kindly dispense 1 month supply either while with needles and syringes or insulin pen if qualifies or cheaper What changed:    how much to take  additional instructions   insulin regular 100 units/mL injection Commonly known as:  NOVOLIN R,HUMULIN R If BG 70 - 120: use  0 units, BG 121-150: 2 units,  BG 151-200: 3 units, BG 201-250: 5 units, BG 251-300: 8 units, BG 301-350: 11 units, BG 351 - 400: 15 units.  Kindly dispense one month supply, syringes and needles as needed, pen if qualifies are cheaper. What changed:  additional instructions   Insulin Syringe-Needle U-100 29G X 1/2" 0.3 ML Misc Commonly known as:  SAFETY-GLIDE 0.3CC SYR 29GX1/2 Use as directed.   levofloxacin 750 MG tablet Commonly known as:  LEVAQUIN Take 1 tablet (750 mg total) by mouth daily.   meloxicam 15 MG tablet Commonly known as:  MOBIC Take 1 tablet (15 mg total) by mouth daily.   nicotine 21 mg/24hr patch Commonly known as:  NICODERM CQ - dosed in mg/24 hours Place 1 patch (21 mg total) onto the skin daily.   protein supplement shake Liqd Commonly known as:  PREMIER PROTEIN Take 325 mLs (11 oz total) by mouth 2 (two) times daily between meals.       Follow-up Information    [facility] Follow up.   Why:  You can go here without insurance to establish Primary Care Provider Contact information: [address] [place] [id number] [phone]          Major procedures and Radiology Reports - PLEASE review detailed and final reports thoroughly  -        Dg Chest 2 View  Result Date: 01/25/2018 CLINICAL DATA:  Acute onset of productive cough and lethargy. EXAM: CHEST - 2 VIEW COMPARISON:  Chest radiograph performed 11/13/2017 FINDINGS: The lungs are well-aerated.  Mild peribronchial thickening is noted. There is question of a 1.5 cm nodule at the left midlung zone. There is no  evidence of pleural effusion or pneumothorax. The heart is normal in size; the mediastinal contour is within normal limits. No acute osseous abnormalities are seen. IMPRESSION: 1. Question of 1.5 cm nodule at the left midlung zone, not well seen in March. CT of the chest would be helpful for further evaluation, when and as deemed clinically appropriate. 2. Mild peribronchial thickening noted. Electronically Signed   By: Roanna Raider M.D.   On: 01/25/2018 00:02   Ct Chest Wo Contrast  Result Date: 01/25/2018 CLINICAL DATA:  Cough. Chest radiograph 01/24/2018 indicated possible left mid lung nodule. EXAM: CT CHEST WITHOUT CONTRAST TECHNIQUE: Multidetector CT imaging of the chest was performed following the standard protocol without IV contrast. COMPARISON:  Chest radiograph 01/24/2018 FINDINGS: Cardiovascular: Evaluation of vascular structures is limited without IV contrast material. Heart size is normal. No pericardial effusion. Calcification in the aorta. No aortic aneurysm. Mediastinum/Nodes: Thyroid gland is unremarkable. Esophagus is decompressed. Scattered lymph nodes in the mediastinum without pathologic enlargement. No mediastinal fluid collections. Lungs/Pleura: Patchy nodular airspace infiltrates demonstrated in the right middle lung and in the left lung base consistent with multifocal pneumonia. No specific nodules identified in the left mid lung that would correspond to the chest x-ray abnormality. The radiographic appearance was likely due to artifact. No pleural effusions. No pneumothorax. Airways are patent. Upper Abdomen: No acute abnormality. Musculoskeletal: No chest wall mass or suspicious bone lesions identified. IMPRESSION: 1. No pulmonary nodules identified that would correspond to the left lung finding on chest radiograph. This was likely artifactual. 2. Patchy nodular airspace infiltrates are demonstrated in the right middle lung and in the left lower lung consistent with multifocal  pneumonia. 3. Mild aortic atherosclerosis. Aortic Atherosclerosis (ICD10-I70.0). Electronically Signed   By: Marisa Cyphers.D.  On: 01/25/2018 03:24    Micro Results     No results found for this or any previous visit (from the past 240 hour(s)).  Today   Subjective    Graylin Shiver today has no headache,no chest abdominal pain,no new weakness tingling or numbness, feels much better wants to go home today.     Objective   Blood pressure (!) 113/58, pulse 63, temperature 97.8 F (36.6 C), temperature source Oral, resp. rate 18, height  (1.778 m), weight 68.1 kg (150 lb 1.6 oz), SpO2 94 %.   Intake/Output Summary (Last 24 hours) at 01/26/2018 0946 Last data filed at 01/26/2018 0600 Gross per 24 hour  Intake 1670 ml  Output 625 ml  Net 1045 ml    Exam Awake Alert, Oriented x 3, No new F.N deficits, Normal affect [place].AT,PERRAL Supple Neck,No JVD, No cervical lymphadenopathy appriciated.  Symmetrical Chest wall movement, Good air movement bilaterally, CTAB RRR,No Gallops,Rubs or new Murmurs, No Parasternal Heave +ve B.Sounds, Abd Soft, Non tender, No organomegaly appriciated, No rebound -guarding or rigidity. No Cyanosis, Clubbing or edema, No new Rash or bruise   Data Review   CBC w Diff:  Lab Results  Component Value Date   WBC 9.1 01/25/2018   HGB 13.0 01/25/2018   HCT 39.9 01/25/2018   PLT 331 01/25/2018   LYMPHOPCT 13 01/25/2018   MONOPCT 10 01/25/2018   EOSPCT 0 01/25/2018   BASOPCT 0 01/25/2018    CMP:  Lab Results  Component Value Date   NA 143 01/26/2018   K 3.7 01/26/2018   CL 105 01/26/2018   CO2 32 01/26/2018   BUN 15 01/26/2018   CREATININE 1.16 01/26/2018   PROT 6.3 (L) 11/14/2017   ALBUMIN 3.7 11/14/2017   BILITOT 1.6 (H) 11/14/2017   ALKPHOS 72 11/14/2017   AST 24 11/14/2017   ALT 22 11/14/2017  . Lab Results  Component Value Date   HGBA1C 11.4 (H) 01/25/2018   CBG (last 3)  Recent Labs    01/25/18 1613 01/25/18 2125  01/26/18 0815  GLUCAP 222* 285* 143*     Total Time in preparing paper work, data evaluation and todays exam - 35 minutes  Susa Raring M.D on 01/26/2018 at 9:46 AM  [facility]   Office  [phone]

## 2018-01-30 LAB — CULTURE, BLOOD (ROUTINE X 2)
CULTURE: NO GROWTH
Culture: NO GROWTH
SPECIAL REQUESTS: ADEQUATE
Special Requests: ADEQUATE

## 2018-02-22 ENCOUNTER — Emergency Department (HOSPITAL_COMMUNITY)
Admission: EM | Admit: 2018-02-22 | Discharge: 2018-02-22 | Disposition: A | Discharge status: Home / Self Care | Payer: Self-pay | Attending: Emergency Medicine | Admitting: Emergency Medicine

## 2018-02-22 ENCOUNTER — Encounter (HOSPITAL_COMMUNITY): Payer: Self-pay | Admitting: *Deleted

## 2018-02-22 ENCOUNTER — Other Ambulatory Visit: Payer: Self-pay

## 2018-02-22 ENCOUNTER — Emergency Department (HOSPITAL_COMMUNITY): Payer: Self-pay

## 2018-02-22 ENCOUNTER — Inpatient Hospital Stay (HOSPITAL_COMMUNITY)
Admission: RE | Admit: 2018-02-22 | Discharge: 2018-02-23 | DRG: 885 | Disposition: A | Discharge status: Other Acute Inpatient Hospital | Payer: Federal, State, Local not specified - Other | Attending: Internal Medicine | Admitting: Internal Medicine

## 2018-02-22 ENCOUNTER — Encounter (HOSPITAL_COMMUNITY): Payer: Self-pay

## 2018-02-22 ENCOUNTER — Encounter (HOSPITAL_COMMUNITY): Payer: Self-pay | Admitting: Emergency Medicine

## 2018-02-22 DIAGNOSIS — R739 Hyperglycemia, unspecified: Secondary | ICD-10-CM | POA: Diagnosis present

## 2018-02-22 DIAGNOSIS — F419 Anxiety disorder, unspecified: Secondary | ICD-10-CM | POA: Diagnosis present

## 2018-02-22 DIAGNOSIS — F17210 Nicotine dependence, cigarettes, uncomplicated: Secondary | ICD-10-CM | POA: Diagnosis present

## 2018-02-22 DIAGNOSIS — Z560 Unemployment, unspecified: Secondary | ICD-10-CM

## 2018-02-22 DIAGNOSIS — Z791 Long term (current) use of non-steroidal anti-inflammatories (NSAID): Secondary | ICD-10-CM | POA: Diagnosis not present

## 2018-02-22 DIAGNOSIS — G4700 Insomnia, unspecified: Secondary | ICD-10-CM | POA: Diagnosis present

## 2018-02-22 DIAGNOSIS — E1069 Type 1 diabetes mellitus with other specified complication: Secondary | ICD-10-CM | POA: Diagnosis present

## 2018-02-22 DIAGNOSIS — E1169 Type 2 diabetes mellitus with other specified complication: Secondary | ICD-10-CM | POA: Diagnosis present

## 2018-02-22 DIAGNOSIS — F332 Major depressive disorder, recurrent severe without psychotic features: Principal | ICD-10-CM | POA: Diagnosis present

## 2018-02-22 DIAGNOSIS — Z915 Personal history of self-harm: Secondary | ICD-10-CM | POA: Diagnosis not present

## 2018-02-22 DIAGNOSIS — Z818 Family history of other mental and behavioral disorders: Secondary | ICD-10-CM | POA: Diagnosis not present

## 2018-02-22 DIAGNOSIS — Z79899 Other long term (current) drug therapy: Secondary | ICD-10-CM | POA: Insufficient documentation

## 2018-02-22 DIAGNOSIS — F1424 Cocaine dependence with cocaine-induced mood disorder: Secondary | ICD-10-CM | POA: Diagnosis present

## 2018-02-22 DIAGNOSIS — N179 Acute kidney failure, unspecified: Secondary | ICD-10-CM | POA: Diagnosis present

## 2018-02-22 DIAGNOSIS — E785 Hyperlipidemia, unspecified: Secondary | ICD-10-CM | POA: Diagnosis present

## 2018-02-22 DIAGNOSIS — R45851 Suicidal ideations: Secondary | ICD-10-CM | POA: Diagnosis present

## 2018-02-22 DIAGNOSIS — F1410 Cocaine abuse, uncomplicated: Secondary | ICD-10-CM | POA: Diagnosis present

## 2018-02-22 DIAGNOSIS — R9431 Abnormal electrocardiogram [ECG] [EKG]: Secondary | ICD-10-CM | POA: Insufficient documentation

## 2018-02-22 DIAGNOSIS — I10 Essential (primary) hypertension: Secondary | ICD-10-CM | POA: Diagnosis present

## 2018-02-22 DIAGNOSIS — R1013 Epigastric pain: Secondary | ICD-10-CM | POA: Insufficient documentation

## 2018-02-22 DIAGNOSIS — F17200 Nicotine dependence, unspecified, uncomplicated: Secondary | ICD-10-CM | POA: Diagnosis present

## 2018-02-22 DIAGNOSIS — E1165 Type 2 diabetes mellitus with hyperglycemia: Secondary | ICD-10-CM | POA: Insufficient documentation

## 2018-02-22 DIAGNOSIS — E1065 Type 1 diabetes mellitus with hyperglycemia: Secondary | ICD-10-CM | POA: Diagnosis present

## 2018-02-22 DIAGNOSIS — Z794 Long term (current) use of insulin: Secondary | ICD-10-CM | POA: Insufficient documentation

## 2018-02-22 HISTORY — DX: Bipolar disorder, unspecified: F31.9

## 2018-02-22 HISTORY — DX: Anxiety disorder, unspecified: F41.9

## 2018-02-22 LAB — CBC
HCT: 35.6 % — ABNORMAL LOW (ref 39.0–52.0)
HEMATOCRIT: 37 % — AB (ref 39.0–52.0)
HEMOGLOBIN: 12.3 g/dL — AB (ref 13.0–17.0)
HEMOGLOBIN: 11.6 g/dL — AB (ref 13.0–17.0)
MCH: 28.5 pg (ref 26.0–34.0)
MCH: 28.4 pg (ref 26.0–34.0)
MCHC: 33.2 g/dL (ref 30.0–36.0)
MCHC: 32.6 g/dL (ref 30.0–36.0)
MCV: 85.6 fL (ref 78.0–100.0)
MCV: 87.3 fL (ref 78.0–100.0)
PLATELETS: 327 10*3/uL (ref 150–400)
Platelets: 344 10*3/uL (ref 150–400)
RBC: 4.32 MIL/uL (ref 4.22–5.81)
RBC: 4.08 MIL/uL — ABNORMAL LOW (ref 4.22–5.81)
RDW: 15.6 % — ABNORMAL HIGH (ref 11.5–15.5)
RDW: 15.8 % — AB (ref 11.5–15.5)
WBC: 4.6 10*3/uL (ref 4.0–10.5)
WBC: 5.2 10*3/uL (ref 4.0–10.5)

## 2018-02-22 LAB — I-STAT CHEM 8, ED
BUN: 28 mg/dL — AB (ref 6–20)
CREATININE: 1.1 mg/dL (ref 0.61–1.24)
Calcium, Ion: 1.03 mmol/L — ABNORMAL LOW (ref 1.15–1.40)
Chloride: 103 mmol/L (ref 101–111)
Glucose, Bld: 160 mg/dL — ABNORMAL HIGH (ref 65–99)
HEMATOCRIT: 34 % — AB (ref 39.0–52.0)
HEMOGLOBIN: 11.6 g/dL — AB (ref 13.0–17.0)
Potassium: 4.3 mmol/L (ref 3.5–5.1)
SODIUM: 135 mmol/L (ref 135–145)
TCO2: 28 mmol/L (ref 22–32)

## 2018-02-22 LAB — BASIC METABOLIC PANEL
ANION GAP: 13 (ref 5–15)
ANION GAP: 11 (ref 5–15)
BUN: 24 mg/dL — ABNORMAL HIGH (ref 6–20)
BUN: 25 mg/dL — AB (ref 6–20)
CALCIUM: 8.2 mg/dL — AB (ref 8.9–10.3)
CALCIUM: 8.8 mg/dL — AB (ref 8.9–10.3)
CO2: 23 mmol/L (ref 22–32)
CO2: 26 mmol/L (ref 22–32)
CREATININE: 1.44 mg/dL — AB (ref 0.61–1.24)
CREATININE: 1.51 mg/dL — AB (ref 0.61–1.24)
Chloride: 105 mmol/L (ref 101–111)
Chloride: 99 mmol/L — ABNORMAL LOW (ref 101–111)
GFR calc Af Amer: >60 mL/min (ref >60)
GFR calc non Af Amer: 57 mL/min — ABNORMAL LOW (ref >60)
GFR, EST NON AFRICAN AMERICAN: 53 mL/min — AB (ref >60)
GLUCOSE: 417 mg/dL — AB (ref 65–99)
Glucose, Bld: 259 mg/dL — ABNORMAL HIGH (ref 65–99)
Potassium: 3.9 mmol/L (ref 3.5–5.1)
Potassium: 4.6 mmol/L (ref 3.5–5.1)
Sodium: 141 mmol/L (ref 135–145)
Sodium: 136 mmol/L (ref 135–145)

## 2018-02-22 LAB — URINALYSIS, ROUTINE W REFLEX MICROSCOPIC
BACTERIA UA: NONE SEEN
Bilirubin Urine: NEGATIVE
Hgb urine dipstick: NEGATIVE
KETONES UR: 80 mg/dL — AB
Leukocytes, UA: NEGATIVE
NITRITE: NEGATIVE
PROTEIN: NEGATIVE mg/dL
Specific Gravity, Urine: 1.026 (ref 1.005–1.030)
pH: 5 (ref 5.0–8.0)

## 2018-02-22 LAB — CBG MONITORING, ED
GLUCOSE-CAPILLARY: 254 mg/dL — AB (ref 65–99)
GLUCOSE-CAPILLARY: 399 mg/dL — AB (ref 65–99)
Glucose-Capillary: 133 mg/dL — ABNORMAL HIGH (ref 65–99)
Glucose-Capillary: 163 mg/dL — ABNORMAL HIGH (ref 65–99)
Glucose-Capillary: 439 mg/dL — ABNORMAL HIGH (ref 65–99)
Glucose-Capillary: 498 mg/dL — ABNORMAL HIGH (ref 65–99)

## 2018-02-22 LAB — COMPREHENSIVE METABOLIC PANEL
ALBUMIN: 3.5 g/dL (ref 3.5–5.0)
ALT: 54 U/L (ref 17–63)
ANION GAP: 16 — AB (ref 5–15)
AST: 24 U/L (ref 15–41)
Alkaline Phosphatase: 73 U/L (ref 38–126)
BUN: 24 mg/dL — ABNORMAL HIGH (ref 6–20)
CALCIUM: 9.1 mg/dL (ref 8.9–10.3)
CHLORIDE: 94 mmol/L — AB (ref 101–111)
CO2: 27 mmol/L (ref 22–32)
Creatinine, Ser: 1.42 mg/dL — ABNORMAL HIGH (ref 0.61–1.24)
GFR calc Af Amer: >60 mL/min (ref >60)
GFR calc non Af Amer: 58 mL/min — ABNORMAL LOW (ref >60)
GLUCOSE: 384 mg/dL — AB (ref 65–99)
POTASSIUM: 4.9 mmol/L (ref 3.5–5.1)
SODIUM: 137 mmol/L (ref 135–145)
TOTAL PROTEIN: 6.3 g/dL — AB (ref 6.5–8.1)
Total Bilirubin: 1.8 mg/dL — ABNORMAL HIGH (ref 0.3–1.2)

## 2018-02-22 LAB — ETHANOL: Alcohol, Ethyl (B): <10 mg/dL (ref <10)

## 2018-02-22 LAB — RAPID URINE DRUG SCREEN, HOSP PERFORMED
Amphetamines: NOT DETECTED
BENZODIAZEPINES: NOT DETECTED
COCAINE: POSITIVE — AB
Opiates: NOT DETECTED
Tetrahydrocannabinol: POSITIVE — AB

## 2018-02-22 LAB — GLUCOSE, CAPILLARY
GLUCOSE-CAPILLARY: 528 mg/dL — AB (ref 65–99)
Glucose-Capillary: >600 mg/dL (ref 65–99)
Glucose-Capillary: >600 mg/dL (ref 65–99)

## 2018-02-22 LAB — I-STAT TROPONIN, ED: Troponin i, poc: 0 ng/mL (ref 0.00–0.08)

## 2018-02-22 LAB — LIPASE, BLOOD: LIPASE: 20 U/L (ref 11–51)

## 2018-02-22 MED ORDER — INSULIN ASPART 100 UNIT/ML ~~LOC~~ SOLN
10.0000 [IU] | Freq: Once | SUBCUTANEOUS | Status: AC
Start: 2018-02-22 — End: 2018-02-22
  Administered 2018-02-22: 10 [IU] via INTRAVENOUS
  Filled 2018-02-22: qty 1

## 2018-02-22 MED ORDER — ACETAMINOPHEN 325 MG PO TABS: 650.0000 mg | ORAL_TABLET | Freq: Four times a day (QID) | ORAL | Status: DC | PRN

## 2018-02-22 MED ORDER — INSULIN ASPART 100 UNIT/ML ~~LOC~~ SOLN
12.0000 [IU] | SUBCUTANEOUS | Status: AC
  Administered 2018-02-22: 12 [IU] via SUBCUTANEOUS

## 2018-02-22 MED ORDER — SODIUM CHLORIDE 0.9 % IV BOLUS
1000.0000 mL | Freq: Once | INTRAVENOUS | Status: AC
  Administered 2018-02-22: 1000 mL via INTRAVENOUS

## 2018-02-22 MED ORDER — TRAZODONE HCL 50 MG PO TABS: 50.0000 mg | ORAL_TABLET | Freq: Every evening | ORAL | Status: DC | PRN

## 2018-02-22 MED ORDER — OMEPRAZOLE 20 MG PO CPDR: 20.0000 mg | DELAYED_RELEASE_CAPSULE | Freq: Every day | ORAL | 0 refills | Status: DC

## 2018-02-22 MED ORDER — INSULIN ASPART PROT & ASPART (70-30 MIX) 100 UNIT/ML ~~LOC~~ SUSP: 25.0000 [IU] | Freq: Two times a day (BID) | SUBCUTANEOUS | Status: DC

## 2018-02-22 MED ORDER — GI COCKTAIL ~~LOC~~
30.0000 mL | Freq: Once | ORAL | Status: AC
  Administered 2018-02-22: 30 mL via ORAL
  Filled 2018-02-22: qty 30

## 2018-02-22 MED ORDER — INSULIN ASPART 100 UNIT/ML ~~LOC~~ SOLN: 4.0000 [IU] | Freq: Three times a day (TID) | SUBCUTANEOUS | Status: DC

## 2018-02-22 MED ORDER — FAMOTIDINE IN NACL 20-0.9 MG/50ML-% IV SOLN
20.0000 mg | INTRAVENOUS | Status: AC
  Administered 2018-02-22: 20 mg via INTRAVENOUS
  Filled 2018-02-22: qty 50

## 2018-02-22 MED ORDER — NICOTINE 21 MG/24HR TD PT24
21.0000 mg | MEDICATED_PATCH | Freq: Every day | TRANSDERMAL | Status: DC
  Filled 2018-02-22 (×4): qty 1

## 2018-02-22 MED ORDER — MAGNESIUM HYDROXIDE 400 MG/5ML PO SUSP: 30.0000 mL | Freq: Every day | ORAL | Status: DC | PRN

## 2018-02-22 MED ORDER — MORPHINE SULFATE (PF) 4 MG/ML IV SOLN
4.0000 mg | Freq: Once | INTRAVENOUS | Status: DC
  Filled 2018-02-22: qty 1

## 2018-02-22 MED ORDER — SUCRALFATE 1 G PO TABS: 1.0000 g | ORAL_TABLET | Freq: Three times a day (TID) | ORAL | 0 refills | Status: DC

## 2018-02-22 MED ORDER — INSULIN ASPART 100 UNIT/ML ~~LOC~~ SOLN
0.0000 [IU] | Freq: Three times a day (TID) | SUBCUTANEOUS | Status: DC
  Administered 2018-02-23 (×2): 15 [IU] via SUBCUTANEOUS
  Administered 2018-02-23: 8 [IU] via SUBCUTANEOUS

## 2018-02-22 MED ORDER — INSULIN ASPART 100 UNIT/ML ~~LOC~~ SOLN: 0.0000 [IU] | Freq: Every day | SUBCUTANEOUS | Status: DC

## 2018-02-22 MED ORDER — ONDANSETRON HCL 4 MG/2ML IJ SOLN
4.0000 mg | Freq: Once | INTRAMUSCULAR | Status: AC
  Administered 2018-02-22: 4 mg via INTRAVENOUS
  Filled 2018-02-22: qty 2

## 2018-02-22 MED ORDER — INSULIN ASPART PROT & ASPART (70-30 MIX) 100 UNIT/ML ~~LOC~~ SUSP
20.0000 [IU] | Freq: Two times a day (BID) | SUBCUTANEOUS | Status: DC
Start: 2018-02-22 — End: 2018-02-23
  Administered 2018-02-22: 20 [IU] via SUBCUTANEOUS

## 2018-02-22 MED ORDER — PNEUMOCOCCAL VAC POLYVALENT 25 MCG/0.5ML IJ INJ: 0.5000 mL | INJECTION | INTRAMUSCULAR | Status: DC

## 2018-02-22 MED ORDER — INSULIN ASPART 100 UNIT/ML ~~LOC~~ SOLN
10.0000 [IU] | Freq: Once | SUBCUTANEOUS | Status: AC
  Administered 2018-02-22: 10 [IU] via SUBCUTANEOUS
  Filled 2018-02-22: qty 1

## 2018-02-22 MED ORDER — ALUM & MAG HYDROXIDE-SIMETH 200-200-20 MG/5ML PO SUSP: 30.0000 mL | ORAL | Status: DC | PRN

## 2018-02-22 NOTE — ED Provider Notes (Signed)
[facility]-EMERGENCY DEPT Provider Note   CSN: [id number] Arrival date & time: 02/22/18  0145     History   Chief Complaint Chief Complaint  Patient presents with  . Abdominal Pain    HPI John Cox is a 47 y.o. male.  Patient presents to the emergency department with a chief complaint of abdominal pain and hematemesis.  He states that he has had the symptoms for the past 3 days.  He reports that his pain is in his epigastrium.  He reports marijuana and cocaine use.  Denies any alcohol use.  He denies any blood in his stool.  Denies any chest pain or shortness of breath.  He states that he is felt like this in the past before.  He has not taken anything for his symptoms.  He denies any history of stomach ulcer.  The history is provided by the patient. No language interpreter was used.    Past Medical History:  Diagnosis Date  . AKI (acute kidney injury) (HCC) 01/25/2018  . Depression 2000  . Diabetes mellitus 1995  . Hyperglycemia 01/25/2018  . Nausea & vomiting   . Polysubstance abuse (HCC) 2012   relapse 2 wk ago   . Suicidal ideation     Patient Active Problem List   Diagnosis Date Noted  . Tachycardia 01/25/2018  . CAP (community acquired pneumonia) 01/25/2018  . Hypoglycemia due to insulin 11/13/2017  . Hypoglycemia 11/13/2017  . Malnutrition of moderate degree 07/19/2015  . DKA (diabetic ketoacidoses) (HCC) 07/18/2015  . DKA, type 2, not at goal [facility]) 07/18/2015  . Hyperkalemia 07/18/2015  . Acute kidney injury (HCC) 07/18/2015  . Nausea vomiting and diarrhea 07/18/2015  . Marijuana abuse 07/18/2015  . Dehydration 07/18/2015  . Dental caries 07/18/2015  . Hyperlipidemia associated with type 2 diabetes mellitus (HCC) 05/24/2014  . Polysubstance abuse (HCC) 05/13/2014  . Major depression, recurrent (HCC) 05/13/2014  . Suicidal ideations 05/13/2014  . ANTICARDIOLIPIN ANTIBODY SYNDROME 05/12/2007  . Type 1 diabetes mellitus (HCC)  04/26/2007  . TOBACCO ABUSE 04/26/2007  . 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 NO PAST SURGERIES          Home Medications    Prior to Admission medications   Medication Sig Start Date End Date Taking? Authorizing Provider  cyclobenzaprine (FLEXERIL) 10 MG tablet Take 1 tablet (10 mg total) by mouth 2 (two) times daily as needed for muscle spasms. Patient not taking: Reported on 01/25/2018 09/29/16   Dorena Bodo, NP  feeding supplement, ENSURE ENLIVE, (ENSURE ENLIVE) LIQD Take 237 mLs by mouth 2 (two) times daily between meals. 11/14/17   Rolly Salter, MD  insulin NPH-regular Human (NOVOLIN 70/30) (70-30) 100 UNIT/ML injection Inject 15 Units into the skin 2 (two) times daily with a meal. Kindly dispense 1 month supply either while with needles and syringes or insulin pen if qualifies or cheaper 01/26/18   Leroy Sea, MD  insulin regular (NOVOLIN R,HUMULIN R) 100 units/mL injection If BG 70 - 120: use 0 units, BG 121-150: 2 units,  BG 151-200: 3 units, BG 201-250: 5 units, BG 251-300: 8 units, BG 301-350: 11 units, BG 351 - 400: 15 units.  Kindly dispense one month supply, syringes and needles as needed, pen if qualifies are cheaper. 01/26/18 01/26/19  Leroy Sea, MD  Insulin Syringe-Needle U-100 (SAFETY-GLIDE 0.3CC SYR 29GX1/2) 29G X 1/2" 0.3 ML MISC Use as directed. 05/18/14  Elease EtienneHongalgi, Anand D, MD  levofloxacin (LEVAQUIN) 750 MG tablet Take 1 tablet (750 mg total) by mouth daily. 01/26/18   Leroy SeaSingh, Prashant K, MD  meloxicam (MOBIC) 15 MG tablet Take 1 tablet (15 mg total) by mouth daily. Patient not taking: Reported on 01/25/2018 09/29/16   Dorena BodoKennard, Lawrence, NP  nicotine (NICODERM CQ - DOSED IN MG/24 HOURS) 21 mg/24hr patch Place 1 patch (21 mg total) onto the skin daily. Patient not taking: Reported on 01/25/2018 11/15/17   Rolly SalterPatel, Pranav M, MD  protein supplement shake (PREMIER PROTEIN) LIQD Take 325 mLs (11 oz total)  by mouth 2 (two) times daily between meals. Patient not taking: Reported on 01/25/2018 11/15/17   Rolly SalterPatel, Pranav M, MD    Family History Family History  Problem Relation Age of Onset  . Diabetes Mother   . COPD Mother   . Heart disease Mother     Social History Social History   Tobacco Use  . Smoking status: Current Every Day Smoker    Packs/day: 2.00    Years: 40.00    Pack years: 80.00    Types: Cigarettes  . Smokeless tobacco: Never Used  Substance Use Topics  . Alcohol use: Yes  . Drug use: Yes    Types: Marijuana, Cocaine     Allergies   Patient has no known allergies.   Review of Systems Review of Systems  All other systems reviewed and are negative.    Physical Exam Updated Vital Signs BP (!) 142/80 (BP Location: Right Arm)   Pulse 82   Temp 98.6 F (37 C) (Oral)   Resp 18   Ht 5\' 11"  (1.803 m)   Wt 68 kg (150 lb)   SpO2 100%   BMI 20.92 kg/m   Physical Exam  Constitutional: He is oriented to person, place, and time. He appears well-developed and well-nourished.  HENT:  Head: Normocephalic and atraumatic.  Eyes: Pupils are equal, round, and reactive to light. Conjunctivae and EOM are normal. Right eye exhibits no discharge. Left eye exhibits no discharge. No scleral icterus.  Neck: Normal range of motion. Neck supple. No JVD present.  Cardiovascular: Normal rate, regular rhythm and normal heart sounds. Exam reveals no gallop and no friction rub.  No murmur heard. Pulmonary/Chest: Effort normal and breath sounds normal. No respiratory distress. He has no wheezes. He has no rales. He exhibits no tenderness.  Abdominal: Soft. He exhibits no distension and no mass. There is tenderness in the epigastric area. There is no rebound and no guarding.  Musculoskeletal: Normal range of motion. He exhibits no edema or tenderness.  Neurological: He is alert and oriented to person, place, and time.  Skin: Skin is warm and dry.  Psychiatric: He has a normal mood  and affect. His behavior is normal. Judgment and thought content normal.  Nursing note and vitals reviewed.    ED Treatments / Results  Labs (all labs ordered are listed, but only abnormal results are displayed) Labs Reviewed  COMPREHENSIVE METABOLIC PANEL - Abnormal; Notable for the following components:      Result Value   Chloride 94 (*)    Glucose, Bld 384 (*)    BUN 24 (*)    Creatinine, Ser 1.42 (*)    Total Protein 6.3 (*)    Total Bilirubin 1.8 (*)    GFR calc non Af Amer 58 (*)    Anion gap 16 (*)    All other components within normal limits  CBC - Abnormal; Notable for  the following components:   Hemoglobin 12.3 (*)    HCT 37.0 (*)    RDW 15.6 (*)    All other components within normal limits  CBG MONITORING, ED - Abnormal; Notable for the following components:   Glucose-Capillary 439 (*)    All other components within normal limits  CBG MONITORING, ED - Abnormal; Notable for the following components:   Glucose-Capillary 498 (*)    All other components within normal limits  LIPASE, BLOOD  ETHANOL  URINALYSIS, ROUTINE W REFLEX MICROSCOPIC  RAPID URINE DRUG SCREEN, HOSP PERFORMED  CBG MONITORING, ED    EKG None  Radiology No results found.  Procedures Procedures (including critical care time)  Medications Ordered in ED Medications  gi cocktail (Maalox,Lidocaine,Donnatal) (has no administration in time range)  famotidine (PEPCID) IVPB 20 mg premix (has no administration in time range)  morphine 4 MG/ML injection 4 mg (has no administration in time range)  ondansetron (ZOFRAN) injection 4 mg (has no administration in time range)     Initial Impression / Assessment and Plan / ED Course  I have reviewed the triage vital signs and the nursing notes.  Pertinent labs & imaging results that were available during my care of the patient were reviewed by me and considered in my medical decision making (see chart for details).     With epigastric abdominal  pain.  Reports some nausea and vomiting for the past 2 days.  Also states that he had an episode of hematemesis.  This was described as streaky blood in his vomit.  He has been resting comfortably all night.  He is in no acute distress.  His abdomen is soft and nontender.  He feels improved after Pepcid.  Anion gap is 16, and glucose is elevated at 384.  Clinically he does not appear to be in DKA.  We will give fluids and insulin.  Will reassess.  Plan for discharge after repeat CBG to ensure downtrending glucose.    Patient signed out to [place], [place], who will continue care.  Final Clinical Impressions(s) / ED Diagnoses   Final diagnoses:  None    ED Discharge Orders    None       Roxy Horseman, PA-C 02/22/18 [id number]    Molpus, Jonny Ruiz, MD 02/22/18 [phone]

## 2018-02-22 NOTE — Progress Notes (Signed)
Inpatient Diabetes Program Recommendations  AACE/ADA: New Consensus Statement on Inpatient Glycemic Control (2015)  Target Ranges:  Prepandial:   less than 140 mg/dL      Peak postprandial:   less than 180 mg/dL (1-2 hours)      Critically ill patients:  140 - 180 mg/dL   Lab Results  Component Value Date   GLUCAP 254 (H) 02/22/2018   HGBA1C 11.4 (H) 01/25/2018    Review of Glycemic Control  Diabetes history: DM1 Outpatient Diabetes medications: Novolin 70//30 25 units bid, R 0-15 units tidwc Current orders for Inpatient glycemic control: Novolog 0-15 units tidwc and hs + 4 units tidwc.  Type 1 and needs basal insulin. Inpatient Diabetes Program Recommendations :     Novolin NPH 15 units QAM and HS.  Will call RN regarding recs.   Thank you. John Cox, RD, LDN, CDE Inpatient Diabetes Coordinator [phone]
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
## 2018-02-22 NOTE — ED Triage Notes (Signed)
PT reports N/V and abdominal pain x2 days. Endorses hematemesis today. Hx diabetes. Endorses cocaine and marijuana use. CBG 360.

## 2018-02-22 NOTE — ED Notes (Addendum)
Pt from [facility] with a CBG reading of high.

## 2018-02-22 NOTE — Plan of Care (Signed)
Nurse discussed depression, anxiety, coping skills with patient.  

## 2018-02-22 NOTE — ED Notes (Signed)
Bed: [id number] Expected date:  Expected time:  Means of arrival:  Comments: 4647 M Hematemesis

## 2018-02-22 NOTE — ED Notes (Signed)
Patient upset because he wanted 2 sandwiches. Writer explained we were out of all sandwiches and they have not been replenished yet. Pt was offered and given ravioli, cheese sticks and a drink. Pt dissatisfied and walked out. Pt stopped, turned around and said "I need a wheelchair" writer asked pt to have a seat and I will go get you one. Pt did not wait, walked out on his own, ambulating without difficulty.
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
## 2018-02-22 NOTE — ED Provider Notes (Signed)
Physical Exam  BP 131/71   Pulse 80   Temp 98.6 F (37 C) (Oral)   Resp 18   Ht 5\' 11"  (1.803 m)   Wt 68 kg (150 lb)   SpO2 98%   BMI 20.92 kg/m   Assumed care from [employer], PA-C at 0700. Briefly, the patient is a 47 y.o. male with PMHx of  has a past medical history of AKI (acute kidney injury) ([facility]) (01/25/2018), Depression (2000), Diabetes mellitus (1995), Hyperglycemia (01/25/2018), Nausea & vomiting, Polysubstance abuse ([facility]) (2012), and Suicidal ideation. here with epigastric pain and vomiting.  Patient also reporting an episode of scant hematemesis at home.  Patient has diabetes which she takes insulin for, but reports that he is not followed by a primary care provider.  Labs Reviewed  COMPREHENSIVE METABOLIC PANEL - Abnormal; Notable for the following components:      Result Value   Chloride 94 (*)    Glucose, Bld 384 (*)    BUN 24 (*)    Creatinine, Ser 1.42 (*)    Total Protein 6.3 (*)    Total Bilirubin 1.8 (*)    GFR calc non Af Amer 58 (*)    Anion gap 16 (*)    All other components within normal limits  CBC - Abnormal; Notable for the following components:   Hemoglobin 12.3 (*)    HCT 37.0 (*)    RDW 15.6 (*)    All other components within normal limits  URINALYSIS, ROUTINE W REFLEX MICROSCOPIC - Abnormal; Notable for the following components:   Color, Urine STRAW (*)    Glucose, UA >=500 (*)    Ketones, ur 80 (*)    All other components within normal limits  RAPID URINE DRUG SCREEN, HOSP PERFORMED - Abnormal; Notable for the following components:   Cocaine POSITIVE (*)    Tetrahydrocannabinol POSITIVE (*)    Barbiturates   (*)    Value: Result not available. Reagent lot number recalled by manufacturer.   All other components within normal limits  BASIC METABOLIC PANEL - Abnormal; Notable for the following components:   Glucose, Bld 259 (*)    BUN 24 (*)    Creatinine, Ser 1.44 (*)    Calcium 8.2 (*)    GFR calc non Af Amer 57 (*)    All other  components within normal limits  CBG MONITORING, ED - Abnormal; Notable for the following components:   Glucose-Capillary 439 (*)    All other components within normal limits  CBG MONITORING, ED - Abnormal; Notable for the following components:   Glucose-Capillary 498 (*)    All other components within normal limits  CBG MONITORING, ED - Abnormal; Notable for the following components:   Glucose-Capillary 254 (*)    All other components within normal limits  LIPASE, BLOOD  ETHANOL  I-STAT TROPONIN, ED    Course of Care:   Physical Exam  Constitutional: He appears well-developed and well-nourished. No distress.  Sitting comfortably in bed.  HENT:  Head: Normocephalic and atraumatic.  Eyes: Conjunctivae are normal. Right eye exhibits no discharge. Left eye exhibits no discharge.  EOMs normal to gross examination.  Neck: Normal range of motion.  Cardiovascular: Normal rate, regular rhythm and normal heart sounds.  No murmur heard. Pulmonary/Chest:  Normal respiratory effort. Patient converses comfortably. No audible wheeze or stridor.  Abdominal: Bowel sounds are normal. He exhibits no distension.  Patient has diffuse discomfort to palpation of the abdomen without focality.  No guarding or  rebound.  Musculoskeletal: Normal range of motion.  Neurological: He is alert.  Cranial nerves intact to gross observation. Patient moves extremities without difficulty.  Skin: Skin is warm and dry. He is not diaphoretic.  Psychiatric: He has a normal mood and affect. His behavior is normal. Judgment and thought content normal.  Nursing note and vitals reviewed.   ED Course/Procedures   Clinical Course as of Feb 23 1747  Mon Feb 22, 2018  0916 Normal anion gap noted. Glucose now <300.  Protein: NEGATIVE [AM]    Clinical Course User Index [AM] Elisha PonderMurray, Alyssa B, PA-C    Procedures  MDM    Per discussion with PA Dahlia ClientBrowning, patient to receive repeat BMP after fluids and insulin, and  if improved and patient feeling well performing p.o. challenge, patient is able to follow-up outpatient.  On my examination, patient had benign abdomen, no vomiting, and no hematemesis.  Given hemodynamic stability, lack of hematemesis or vomiting the emergency department, doubt acute GI bleed.  Repeat BMP demonstrated no anion gap.  Glucose lowered to less than 300.  Clinically, patient does not appear to be in DKA.  I stressed with patient the importance of establishing primary care, and case management consulted to provide these resources to patient.  Return precautions given to patient for any worsening abdominal pain, hematemesis, fever with abdominal pain, or any new or worsening symptoms.     Delia ChimesMurray, Alyssa B, PA-C 02/22/18 1750    Paula LibraMolpus, John, MD 02/22/18 2131

## 2018-02-22 NOTE — Care Management Note (Signed)
Case Management Note  CM consulted for no pcp and no ins with need for DM follow up.  CM placed all 3 clinics with pharmacy and financial counseling information on AVS for pt to schedule an appointment.  Elnoria HowardUpdated Murray, PA.  No further CM needs noted at this time.  Alba Perillo, Lynnae SandhoffAngela N, RN 02/22/2018, 9:53 AM

## 2018-02-22 NOTE — ED Notes (Signed)
BH tech with patient in room very upset with pt's care. Reported that pt has been here for a while and has not received great care. Discussed timeline with patient, apologizing for wait time.

## 2018-02-22 NOTE — Discharge Instructions (Addendum)
Your lab work is reassuring today.  Your sugar came down with the treatments we provided.  Please return to the emergency department if you develop any worsening abdominal pain, inability to keep anything down by mouth, repeatedly vomiting blood, new or worsening symptoms.  It is very important that you find a primary care provider.  We listed some on your paperwork.  Please call all 3 to set up an appointment.  You are prescribed Carafate.  This medication can be taken up to 4 times daily, with meals and at bedtime.  This is a Band-Aid to coat the lining of the stomach.  You are prescribed omeprazole, which reduces acid in the stomach to help with inflammation.  Your EKG demonstrates some signs that you may have some ongoing enlargement of the heart muscle. Please follow up with your primary doctor I listed to discuss managing heart risk factors.

## 2018-02-22 NOTE — Discharge Instructions (Addendum)
Please check sugar after pt eats this evening and supplement with short acting insulin based on sliding scale. Restart insulin tomorrow as patients regular dosage.

## 2018-02-22 NOTE — ED Provider Notes (Signed)
[facility]-EMERGENCY DEPT Provider Note   CSN: [id number] Arrival date & time: 02/22/18  2148     History   Chief Complaint Chief Complaint  Patient presents with  . Hyperglycemia    HPI John Cox is a 47 y.o. male.  HPI John Cox is a 47 y.o. male with history of bipolar disorder, depression, diabetes, polysubstance abuse, history of kidney injury, returns to emergency department with complaint of elevated blood sugar.  Patient is currently behavioral health.  He was seen yesterday and admitted to [facility].  He states when he was here in emergency department waiting to go over there, and he was denied any food.  He states when he got over there today, he ate a sandwich, and when they checked his blood sugar it was greater than 600.  He states he did not get any insulin earlier today.  They gave him 12 units of NovoLog at 1726.  When they rechecked his blood sugar again at 6 PM, it was still greater than 600.  Patient states he did not feel well and was sent here.  Upon arrival to emergency department his blood glucose was 399, at 1930.  I just saw the patient, the current time is 2230, his sugar is now 160.   Past Medical History:  Diagnosis Date  . AKI (acute kidney injury) (HCC) 01/25/2018  . Anxiety   . Bipolar disorder (HCC)   . Depression 2000  . Diabetes mellitus 1995  . Hyperglycemia 01/25/2018  . Nausea & vomiting   . Polysubstance abuse (HCC) 2012   relapse 2 wk ago   . Suicidal ideation     Patient Active Problem List   Diagnosis Date Noted  . MDD (major depressive disorder), recurrent episode, severe (HCC) 02/22/2018  . Tachycardia 01/25/2018  . CAP (community acquired pneumonia) 01/25/2018  . Hypoglycemia due to insulin 11/13/2017  . Hypoglycemia 11/13/2017  . Malnutrition of moderate degree 07/19/2015  . DKA (diabetic ketoacidoses) (HCC) 07/18/2015  . DKA, type 2, not at goal [facility]) 07/18/2015  . Hyperkalemia 07/18/2015  . Acute  kidney injury (HCC) 07/18/2015  . Nausea vomiting and diarrhea 07/18/2015  . Marijuana abuse 07/18/2015  . Dehydration 07/18/2015  . Dental caries 07/18/2015  . Hyperlipidemia associated with type 2 diabetes mellitus (HCC) 05/24/2014  . Polysubstance abuse (HCC) 05/13/2014  . Major depression, recurrent (HCC) 05/13/2014  . Suicidal ideations 05/13/2014  . ANTICARDIOLIPIN ANTIBODY SYNDROME 05/12/2007  . Type 1 diabetes mellitus (HCC) 04/26/2007  . TOBACCO ABUSE 04/26/2007  . 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 NO PAST SURGERIES          Home Medications    Prior to Admission medications   Medication Sig Start Date End Date Taking? Authorizing Provider  cyclobenzaprine (FLEXERIL) 10 MG tablet Take 1 tablet (10 mg total) by mouth 2 (two) times daily as needed for muscle spasms. Patient not taking: Reported on 01/25/2018 09/29/16   Dorena Bodo, NP  feeding supplement, ENSURE ENLIVE, (ENSURE ENLIVE) LIQD Take 237 mLs by mouth 2 (two) times daily between meals. Patient not taking: Reported on 02/22/2018 11/14/17   Rolly Salter, MD  insulin NPH-regular Human (NOVOLIN 70/30) (70-30) 100 UNIT/ML injection Inject 15 Units into the skin 2 (two) times daily with a meal. Kindly dispense 1 month supply either while with needles and syringes or insulin pen if qualifies or cheaper Patient taking differently: Inject 25 Units into the  skin 2 (two) times daily with a meal. Kindly dispense 1 month supply either while with needles and syringes or insulin pen if qualifies or cheaper 01/26/18   Leroy Sea, MD  insulin regular (NOVOLIN R,HUMULIN R) 100 units/mL injection If BG 70 - 120: use 0 units, BG 121-150: 2 units,  BG 151-200: 3 units, BG 201-250: 5 units, BG 251-300: 8 units, BG 301-350: 11 units, BG 351 - 400: 15 units.  Kindly dispense one month supply, syringes and needles as needed, pen if qualifies are cheaper. 01/26/18 01/26/19   Leroy Sea, MD  Insulin Syringe-Needle U-100 (SAFETY-GLIDE 0.3CC SYR 29GX1/2) 29G X 1/2" 0.3 ML MISC Use as directed. 05/18/14   Hongalgi, Maximino Greenland, MD  levofloxacin (LEVAQUIN) 750 MG tablet Take 1 tablet (750 mg total) by mouth daily. Patient not taking: Reported on 02/22/2018 01/26/18   Leroy Sea, MD  meloxicam (MOBIC) 15 MG tablet Take 1 tablet (15 mg total) by mouth daily. Patient not taking: Reported on 01/25/2018 09/29/16   Dorena Bodo, NP  nicotine (NICODERM CQ - DOSED IN MG/24 HOURS) 21 mg/24hr patch Place 1 patch (21 mg total) onto the skin daily. Patient not taking: Reported on 01/25/2018 11/15/17   Rolly Salter, MD  omeprazole (PRILOSEC) 20 MG capsule Take 1 capsule (20 mg total) by mouth daily. 02/22/18   Roxy Horseman, PA-C  protein supplement shake (PREMIER PROTEIN) LIQD Take 325 mLs (11 oz total) by mouth 2 (two) times daily between meals. Patient not taking: Reported on 01/25/2018 11/15/17   Rolly Salter, MD  sucralfate (CARAFATE) 1 g tablet Take 1 tablet (1 g total) by mouth 4 (four) times daily -  with meals and at bedtime. 02/22/18   Roxy Horseman, PA-C    Family History Family History  Problem Relation Age of Onset  . Diabetes Mother   . COPD Mother   . Heart disease Mother     Social History Social History   Tobacco Use  . Smoking status: Current Every Day Smoker    Packs/day: 1.00    Years: 25.00    Pack years: 25.00    Types: Cigarettes  . Smokeless tobacco: Never Used  Substance Use Topics  . Alcohol use: Yes    Alcohol/week: 0.6 oz    Types: 1 Cans of beer per week    Comment: one beer weekly  . Drug use: Yes    Types: Marijuana, Cocaine    Comment: THC daily, Cocaine daily     Allergies   Patient has no known allergies.   Review of Systems Review of Systems  Constitutional: Positive for fatigue. Negative for chills and fever.  Respiratory: Negative for cough, chest tightness and shortness of breath.   Cardiovascular:  Negative for chest pain, palpitations and leg swelling.  Gastrointestinal: Negative for abdominal distention, abdominal pain, diarrhea, nausea and vomiting.  Genitourinary: Negative for dysuria, frequency, hematuria and urgency.  Musculoskeletal: Negative for arthralgias, myalgias, neck pain and neck stiffness.  Skin: Negative for rash.  Allergic/Immunologic: Negative for immunocompromised state.  Neurological: Positive for weakness. Negative for dizziness, light-headedness, numbness and headaches.  All other systems reviewed and are negative.    Physical Exam Updated Vital Signs BP 121/72   Pulse 72   Temp 97.7 F (36.5 C) (Oral)   Resp 18   Ht 5\' 10"  (1.778 m)   Wt 66.7 kg (147 lb)   SpO2 100%   BMI 21.09 kg/m   Physical Exam  Constitutional: He appears  well-developed and well-nourished. No distress.  HENT:  Head: Normocephalic and atraumatic.  Eyes: Conjunctivae are normal.  Neck: Neck supple.  Cardiovascular: Normal rate, regular rhythm and normal heart sounds.  Pulmonary/Chest: Effort normal. No respiratory distress. He has no wheezes. He has no rales.  Abdominal: Soft. Bowel sounds are normal. He exhibits no distension. There is no tenderness. There is no rebound.  Musculoskeletal: He exhibits no edema.  Neurological: He is alert.  Skin: Skin is warm and dry.  Nursing note and vitals reviewed.    ED Treatments / Results  Labs (all labs ordered are listed, but only abnormal results are displayed) Labs Reviewed  GLUCOSE, CAPILLARY - Abnormal; Notable for the following components:      Result Value   Glucose-Capillary >600 (*)    All other components within normal limits  GLUCOSE, CAPILLARY - Abnormal; Notable for the following components:   Glucose-Capillary >600 (*)    All other components within normal limits  GLUCOSE, CAPILLARY - Abnormal; Notable for the following components:   Glucose-Capillary 528 (*)    All other components within normal limits  CBG  MONITORING, ED - Abnormal; Notable for the following components:   Glucose-Capillary 163 (*)    All other components within normal limits  I-STAT CHEM 8, ED    EKG None  Radiology Dg Abd Acute W/chest  Result Date: 02/22/2018 CLINICAL DATA:  Nausea, vomiting and abdominal pain x2 days. EXAM: DG ABDOMEN ACUTE W/ 1V CHEST COMPARISON:  CXR 11/13/2017, CT AP 05/06/2017 FINDINGS: There is no evidence of dilated bowel loops or free intraperitoneal air. No radiopaque calculi or other significant radiographic abnormality is seen. Pelvic phleboliths are present bilaterally. Heart size and mediastinal contours are within normal limits. Both lungs are clear. IMPRESSION: Negative abdominal radiographs.  No acute cardiopulmonary disease. Electronically Signed   By: Tollie Eth M.D.   On: 02/22/2018 02:58    Procedures Procedures (including critical care time)  Medications Ordered in ED Medications  acetaminophen (TYLENOL) tablet 650 mg (has no administration in time range)  alum & mag hydroxide-simeth (MAALOX/MYLANTA) 200-200-20 MG/5ML suspension 30 mL (has no administration in time range)  magnesium hydroxide (MILK OF MAGNESIA) suspension 30 mL (has no administration in time range)  traZODone (DESYREL) tablet 50 mg (has no administration in time range)  insulin aspart (novoLOG) injection 0-15 Units (0 Units Subcutaneous Not Given 02/22/18 1728)  insulin aspart (novoLOG) injection 0-5 Units (0 Units Subcutaneous Not Given 02/22/18 2226)  pneumococcal 23 valent vaccine (PNU-IMMUNE) injection 0.5 mL (has no administration in time range)  nicotine (NICODERM CQ - dosed in mg/24 hours) patch 21 mg (21 mg Transdermal Not Given 02/22/18 1730)  insulin aspart protamine- aspart (NOVOLOG MIX 70/30) injection 20 Units (20 Units Subcutaneous Given 02/22/18 1726)  insulin aspart (novoLOG) injection 12 Units (12 Units Subcutaneous Given 02/22/18 1706)     Initial Impression / Assessment and Plan / ED Course  I  have reviewed the triage vital signs and the nursing notes.  Pertinent labs & imaging results that were available during my care of the patient were reviewed by me and considered in my medical decision making (see chart for details).     Patient sent to the ER for blood glucose greater than 600.  Sounds like they did give him 12 units of insulin, but the blood sugar did not come down accordingly, so he was sent here.  His current blood sugar is 163.  Patient states he is very very hungry and eats  food right now.  I will give him a sandwich.  I will check Chem-8.  11:56 PM BUN slightly elevated, otherwise normal electrolytes.  No concern for DKA.  Patient ate some snacks here in emergency department.  He is going to go back to behavioral health, and states that he will eat a salad when he gets there.  I instructed them to check his sugar after he eats the salad and supplement with his short acting insulin depending on what his sugars at that time.  Since he already had his long-acting insulin this evening, I do not think he needs another dose of that.  Restart insulin as he is normally does tomorrow morning.   Vitals:   02/22/18 1410 02/22/18 1531 02/22/18 1536  BP: 121/72 121/72 121/72  Pulse: 72 72 72  Resp: 18    Temp: 97.7 F (36.5 C)    TempSrc: Oral    SpO2: 100%    Weight: 66.7 kg (147 lb)    Height: 5\' 10"  (1.778 m)       Final Clinical Impressions(s) / ED Diagnoses   Final diagnoses:  Severe episode of recurrent major depressive disorder, without psychotic features [facility](HCC)    ED Discharge Orders    None       Jaynie CrumbleKirichenko, [place] Perry, PA-C 02/22/18 2359    Linwood DibblesKnapp, Jon, MD 02/23/18 [phone]

## 2018-02-22 NOTE — Progress Notes (Signed)
Patient ID: Rhae LernerWilliam B Utz, male   DOB: 12-Dec-1970, 47 y.o.   MRN: [id number]  Pt CBG <600 at 1653. Pt reports feeling tired, given a large cup of ice water. MD notified, to give one time NOW dose of 12U Novolog and recheck blood sugar in one hour. Novolog 70/30 also ordered per pt PTA medications. Given at 1726, tolerated well. To bring patient low carb meal for dinner. Pt lying down. In no current distress.   Update: 6:05 PM Upon recheck pt CBG <600. MD notified, will recheck in one hour and notify MD. Per Lillia AbedLindsay, [facility] and protocol if patient blood sugar is over 400 pt should be sent to ED. Pt in no current distress.   Update: 6:58 PM Pt unable to eat dinner. States "I picked a couple of things off and threw the rest away." Pt reports malaise. Pt blood sugar 528. Per MD orders, will send to ED for further evaluation.

## 2018-02-22 NOTE — ED Notes (Signed)
Arrived here approx 1930, seen and triaged at that time. [facility].

## 2018-02-22 NOTE — ED Notes (Signed)
Pt given urinal in order to provide urine specimen, and he is aware we would like a sample.

## 2018-02-22 NOTE — ED Notes (Signed)
Patient transported to X-ray 
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
## 2018-02-22 NOTE — ED Notes (Signed)
Bed: WA01 Expected date:  Expected time:  Means of arrival:  Comments: Triage 1 

## 2018-02-22 NOTE — ED Triage Notes (Addendum)
[facility]. CBG 399 in triage.

## 2018-02-22 NOTE — Tx Team (Signed)
Initial Treatment Plan 02/22/2018 4:23 PM John LernerWilliam B Behrman [id number]    PATIENT STRESSORS: Financial difficulties Health problems Medication change or noncompliance Occupational concerns Substance abuse   PATIENT STRENGTHS: Ability for insight Average or above average intelligence Capable of independent living Communication skills General fund of knowledge Motivation for treatment/growth Physical Health Supportive family/friends Work skills   PATIENT IDENTIFIED PROBLEMS: "depression"  "anxiety"  "substance abuse"                 DISCHARGE CRITERIA:  Ability to meet basic life and health needs Adequate post-discharge living arrangements Improved stabilization in mood, thinking, and/or behavior Medical problems require only outpatient monitoring Motivation to continue treatment in a less acute level of care Need for constant or close observation no longer present Reduction of life-threatening or endangering symptoms to within safe limits Safe-care adequate arrangements made Verbal commitment to aftercare and medication compliance Withdrawal symptoms are absent or subacute and managed without 24-hour nursing intervention  PRELIMINARY DISCHARGE PLAN: Attend aftercare/continuing care group Attend PHP/IOP Attend 12-step recovery group Outpatient therapy Participate in family therapy Return to previous living arrangement  PATIENT/FAMILY INVOLVEMENT: This treatment plan has been presented to and reviewed with the patient, John LernerWilliam B Cox  .  The patient and family have been given the opportunity to ask questions and make suggestions.  Quintella ReichertKnight, Annastasia Haskins Wallingford CenterShephard, [place]RN 02/22/2018, 4:23 PM

## 2018-02-23 ENCOUNTER — Emergency Department (HOSPITAL_COMMUNITY)
Admission: EM | Admit: 2018-02-23 | Discharge: 2018-02-23 | Disposition: A | Discharge status: Other Acute Inpatient Hospital | Payer: Federal, State, Local not specified - Other | Source: Home / Self Care

## 2018-02-23 ENCOUNTER — Encounter (HOSPITAL_COMMUNITY): Payer: Self-pay | Admitting: Behavioral Health

## 2018-02-23 DIAGNOSIS — E1065 Type 1 diabetes mellitus with hyperglycemia: Secondary | ICD-10-CM

## 2018-02-23 DIAGNOSIS — Z818 Family history of other mental and behavioral disorders: Secondary | ICD-10-CM

## 2018-02-23 DIAGNOSIS — R739 Hyperglycemia, unspecified: Secondary | ICD-10-CM

## 2018-02-23 DIAGNOSIS — F1424 Cocaine dependence with cocaine-induced mood disorder: Secondary | ICD-10-CM

## 2018-02-23 DIAGNOSIS — F17210 Nicotine dependence, cigarettes, uncomplicated: Secondary | ICD-10-CM

## 2018-02-23 DIAGNOSIS — F332 Major depressive disorder, recurrent severe without psychotic features: Principal | ICD-10-CM

## 2018-02-23 DIAGNOSIS — G4700 Insomnia, unspecified: Secondary | ICD-10-CM

## 2018-02-23 DIAGNOSIS — Z814 Family history of other substance abuse and dependence: Secondary | ICD-10-CM

## 2018-02-23 DIAGNOSIS — R45851 Suicidal ideations: Secondary | ICD-10-CM

## 2018-02-23 DIAGNOSIS — R450 Nervousness: Secondary | ICD-10-CM

## 2018-02-23 LAB — CBC WITH DIFFERENTIAL/PLATELET
BASOS PCT: 0 %
Basophils Absolute: 0 10*3/uL (ref 0.0–0.1)
EOS ABS: 0.1 10*3/uL (ref 0.0–0.7)
Eosinophils Relative: 1 %
HCT: 34.1 % — ABNORMAL LOW (ref 39.0–52.0)
HEMOGLOBIN: 11.1 g/dL — AB (ref 13.0–17.0)
Lymphocytes Relative: 19 %
Lymphs Abs: 1 10*3/uL (ref 0.7–4.0)
MCH: 28 pg (ref 26.0–34.0)
MCHC: 32.6 g/dL (ref 30.0–36.0)
MCV: 85.9 fL (ref 78.0–100.0)
Monocytes Absolute: 0.3 10*3/uL (ref 0.1–1.0)
Monocytes Relative: 6 %
NEUTROS PCT: 74 %
Neutro Abs: 3.8 10*3/uL (ref 1.7–7.7)
Platelets: 307 10*3/uL (ref 150–400)
RBC: 3.97 MIL/uL — AB (ref 4.22–5.81)
RDW: 15.2 % (ref 11.5–15.5)
WBC: 5.2 10*3/uL (ref 4.0–10.5)

## 2018-02-23 LAB — BLOOD GAS, VENOUS
Acid-base deficit: 3.4 mmol/L — ABNORMAL HIGH (ref 0.0–2.0)
BICARBONATE: 21.2 mmol/L (ref 20.0–28.0)
O2 Saturation: 77.7 %
PH VEN: 7.356 (ref 7.250–7.430)
PO2 VEN: 46.4 mmHg — AB (ref 32.0–45.0)
Patient temperature: 98.6
pCO2, Ven: 38.9 mmHg — ABNORMAL LOW (ref 44.0–60.0)

## 2018-02-23 LAB — CBG MONITORING, ED: GLUCOSE-CAPILLARY: 197 mg/dL — AB (ref 70–99)

## 2018-02-23 LAB — GLUCOSE, CAPILLARY
GLUCOSE-CAPILLARY: 165 mg/dL — AB (ref 70–99)
GLUCOSE-CAPILLARY: 268 mg/dL — AB (ref 70–99)
GLUCOSE-CAPILLARY: 526 mg/dL — AB (ref 70–99)
GLUCOSE-CAPILLARY: 502 mg/dL — AB (ref 70–99)
Glucose-Capillary: 495 mg/dL — ABNORMAL HIGH (ref 70–99)
Glucose-Capillary: 510 mg/dL (ref 70–99)
Glucose-Capillary: 539 mg/dL (ref 70–99)

## 2018-02-23 LAB — BASIC METABOLIC PANEL
Anion gap: 13 (ref 5–15)
BUN: 26 mg/dL — ABNORMAL HIGH (ref 6–20)
CHLORIDE: 101 mmol/L (ref 98–111)
CO2: 23 mmol/L (ref 22–32)
CREATININE: 1.47 mg/dL — AB (ref 0.61–1.24)
Calcium: 8.5 mg/dL — ABNORMAL LOW (ref 8.9–10.3)
GFR calc non Af Amer: 55 mL/min — ABNORMAL LOW (ref >60)
Glucose, Bld: 407 mg/dL — ABNORMAL HIGH (ref 70–99)
POTASSIUM: 4 mmol/L (ref 3.5–5.1)
SODIUM: 137 mmol/L (ref 135–145)

## 2018-02-23 MED ORDER — GLUCERNA SHAKE PO LIQD
237.0000 mL | Freq: Two times a day (BID) | ORAL | Status: DC
  Administered 2018-02-23: 237 mL via ORAL

## 2018-02-23 MED ORDER — SERTRALINE HCL 50 MG PO TABS
50.0000 mg | ORAL_TABLET | Freq: Every day | ORAL | Status: DC
  Administered 2018-02-23: 50 mg via ORAL
  Filled 2018-02-23 (×4): qty 1

## 2018-02-23 MED ORDER — INSULIN ASPART 100 UNIT/ML ~~LOC~~ SOLN: 2.0000 [IU] | Freq: Once | SUBCUTANEOUS | Status: DC

## 2018-02-23 MED ORDER — ONDANSETRON 4 MG PO TBDP
4.0000 mg | ORAL_TABLET | Freq: Four times a day (QID) | ORAL | Status: DC | PRN
Start: 2018-02-23 — End: 2018-02-23
  Administered 2018-02-23: 4 mg via ORAL

## 2018-02-23 MED ORDER — SODIUM CHLORIDE 0.9 % IV SOLN
INTRAVENOUS | Status: DC
  Filled 2018-02-23: qty 1

## 2018-02-23 MED ORDER — INSULIN ASPART PROT & ASPART (70-30 MIX) 100 UNIT/ML ~~LOC~~ SUSP
25.0000 [IU] | Freq: Two times a day (BID) | SUBCUTANEOUS | Status: DC
  Administered 2018-02-23: 25 [IU] via SUBCUTANEOUS

## 2018-02-23 MED ORDER — DEXTROSE-NACL 5-0.45 % IV SOLN: INTRAVENOUS | Status: DC

## 2018-02-23 MED ORDER — ADULT MULTIVITAMIN W/MINERALS CH
1.0000 | ORAL_TABLET | Freq: Every day | ORAL | Status: DC
  Filled 2018-02-23 (×3): qty 1

## 2018-02-23 MED ORDER — ONDANSETRON 4 MG PO TBDP
ORAL_TABLET | ORAL | Status: AC
  Administered 2018-02-23: 4 mg via ORAL
  Filled 2018-02-23: qty 1

## 2018-02-23 MED ORDER — SODIUM CHLORIDE 0.9 % IV SOLN
INTRAVENOUS | Status: DC
  Administered 2018-02-23: 19:00:00 via INTRAVENOUS

## 2018-02-23 MED ORDER — SODIUM CHLORIDE 0.9 % IV BOLUS
1000.0000 mL | Freq: Once | INTRAVENOUS | Status: AC
Start: 2018-02-23 — End: 2018-02-23
  Administered 2018-02-23: 1000 mL via INTRAVENOUS

## 2018-02-23 NOTE — Progress Notes (Signed)
NUTRITION ASSESSMENT  Pt identified as at risk on the Malnutrition Screen Tool  INTERVENTION: 1. Educated patient on the importance of nutrition and encouraged intake of food and beverages. 2. Discussed weight goals. 3. Supplements:  - will order Glucerna Shake BID, each supplement provides 220 kcal and 10 grams of protein. - will order daily multivitamin with minerals.   NUTRITION DIAGNOSIS: Unintentional weight loss related to sub-optimal intake as evidenced by pt report.   Goal: Pt to meet >/= 90% of their estimated nutrition needs.  Monitor:  PO intake  Assessment:  Patient with hx of bipolar disorder, depression, DM, polysubstance abuse, and kidney injury. [facility] with elevated CBGs. He initially presented to the ED on 6/24 for N/V and abdominal pain x2 days and reported daily marijuana and cocaine use. Notes state that UDS was positive for cocaine. [facility] and SI.  Patient reports that he has lost weight over the past 5 months. Based on chart review, he has lost 17 lbs (10% body weight) in the past 3.5 months. This is significant for time frame.  Will order items as outlined above. Continue to encourage PO intakes of meals, supplements, and snacks.    47 y.o. male  Height: Ht Readings from Last 1 Encounters:  02/22/18 5\' 10"  (1.778 m)    Weight: Wt Readings from Last 1 Encounters:  02/22/18 147 lb (66.7 kg)    Weight Hx: Wt Readings from Last 10 Encounters:  02/22/18 147 lb (66.7 kg)  02/22/18 150 lb (68 kg)  01/25/18 150 lb 1.6 oz (68.1 kg)  11/13/17 164 lb 3.9 oz (74.5 kg)  03/21/16 157 lb (71.2 kg)  07/19/15 158 lb 6.4 oz (71.8 kg)  05/23/14 167 lb 12.8 oz (76.1 kg)  05/17/14 157 lb 13.6 oz (71.6 kg)  04/18/13 174 lb (78.9 kg)  08/27/11 178 lb (80.7 kg)    BMI:  Body mass index is 21.09 kg/m. Pt meets criteria for normal weight based on current BMI.  Estimated Nutritional  Needs: Kcal: 25-30 kcal/kg Protein: > 1 gram protein/kg Fluid: 1 ml/kcal  Diet Order:  Diet Order           Diet Carb Modified Fluid consistency: Thin; Room service appropriate? Yes  Diet effective now         Pt is also offered choice of unit snacks mid-morning and mid-afternoon.  Pt is eating as desired.   Lab results and medications reviewed.     Trenton GammonJessica Jaleel Allen, MS, RD, LDN, [facility] Inpatient Clinical Dietitian Pager # [phone] After hours/weekend pager # [phone]

## 2018-02-23 NOTE — ED Notes (Signed)
Gave report to Brett CanalesSteve, Charity fundraiserN at [facility]. Left name and number in case he had additional questions.
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
## 2018-02-23 NOTE — H&P (Deleted)
  The note originally documented on this encounter has been moved the the encounter in which it belongs.  

## 2018-02-23 NOTE — ED Provider Notes (Signed)
[facility]-EMERGENCY DEPT Provider Note   CSN: [id number] Arrival date & time: 02/22/18  2148     History   Chief Complaint Chief Complaint  Patient presents with  . Hyperglycemia    HPI John Cox is a 47 y.o. male.  47 year old male with history of diabetes who [facility] with [facility] from [facility].  Was seen here last night for similar symptoms and treated with IV fluids and insulin as well as had his long-acting insulin med adjusted.  I received a call today from the psychiatrist he said that his blood sugars have been over 500.  He is also been experiencing nausea and vomiting which was treated with Zofran.  He is had decreased oral intake but yet remained hyperglycemic.  The psychiatrist I consulted with tried hospitalist and it was reviewed and they recommended patient be sent to the ED for work-up for possible DKA and likely admission.  Patient notes abdominal cramping without fever chills.  He was transported here for further management.     Past Medical History:  Diagnosis Date  . AKI (acute kidney injury) (HCC) 01/25/2018  . Anxiety   . Bipolar disorder (HCC)   . Depression 2000  . Diabetes mellitus 1995  . Hyperglycemia 01/25/2018  . Nausea & vomiting   . Polysubstance abuse (HCC) 2012   relapse 2 wk ago   . Suicidal ideation     Patient Active Problem List   Diagnosis Date Noted  . Cocaine dependence with cocaine-induced mood disorder (HCC)   . MDD (major depressive disorder), recurrent severe, without psychosis (HCC) 02/22/2018  . Tachycardia 01/25/2018  . CAP (community acquired pneumonia) 01/25/2018  . Hypoglycemia due to insulin 11/13/2017  . Hypoglycemia 11/13/2017  . Malnutrition of moderate degree 07/19/2015  . DKA (diabetic ketoacidoses) (HCC) 07/18/2015  . DKA, type 2, not at goal [facility](HCC) 07/18/2015  . Hyperkalemia 07/18/2015  . Acute kidney injury (HCC) 07/18/2015  . Nausea vomiting and diarrhea  07/18/2015  . Marijuana abuse 07/18/2015  . Dehydration 07/18/2015  . Dental caries 07/18/2015  . Hyperlipidemia associated with type 2 diabetes mellitus (HCC) 05/24/2014  . Polysubstance abuse (HCC) 05/13/2014  . Major depression, recurrent (HCC) 05/13/2014  . Suicidal ideation 05/13/2014  . ANTICARDIOLIPIN ANTIBODY SYNDROME 05/12/2007  . Type 1 diabetes mellitus (HCC) 04/26/2007  . TOBACCO ABUSE 04/26/2007  . 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 NO PAST SURGERIES          Home Medications    Prior to Admission medications   Medication Sig Start Date End Date Taking? Authorizing Provider  cyclobenzaprine (FLEXERIL) 10 MG tablet Take 1 tablet (10 mg total) by mouth 2 (two) times daily as needed for muscle spasms. Patient not taking: Reported on 01/25/2018 09/29/16   Dorena BodoKennard, Lawrence, NP  feeding supplement, ENSURE ENLIVE, (ENSURE ENLIVE) LIQD Take 237 mLs by mouth 2 (two) times daily between meals. Patient not taking: Reported on 02/22/2018 11/14/17   Rolly SalterPatel, Pranav M, MD  insulin NPH-regular Human (NOVOLIN 70/30) (70-30) 100 UNIT/ML injection Inject 15 Units into the skin 2 (two) times daily with a meal. Kindly dispense 1 month supply either while with needles and syringes or insulin pen if qualifies or cheaper Patient taking differently: Inject 25 Units into the skin 2 (two) times daily with a meal. Kindly dispense 1 month supply either while with needles and syringes or insulin pen if qualifies or cheaper 01/26/18   Susa RaringSingh, Prashant  K, MD  insulin regular (NOVOLIN R,HUMULIN R) 100 units/mL injection If BG 70 - 120: use 0 units, BG 121-150: 2 units,  BG 151-200: 3 units, BG 201-250: 5 units, BG 251-300: 8 units, BG 301-350: 11 units, BG 351 - 400: 15 units.  Kindly dispense one month supply, syringes and needles as needed, pen if qualifies are cheaper. 01/26/18 01/26/19  Leroy Sea, MD  Insulin Syringe-Needle U-100  (SAFETY-GLIDE 0.3CC SYR 29GX1/2) 29G X 1/2" 0.3 ML MISC Use as directed. 05/18/14   Hongalgi, Maximino Greenland, MD  levofloxacin (LEVAQUIN) 750 MG tablet Take 1 tablet (750 mg total) by mouth daily. Patient not taking: Reported on 02/22/2018 01/26/18   Leroy Sea, MD  meloxicam (MOBIC) 15 MG tablet Take 1 tablet (15 mg total) by mouth daily. Patient not taking: Reported on 01/25/2018 09/29/16   Dorena Bodo, NP  nicotine (NICODERM CQ - DOSED IN MG/24 HOURS) 21 mg/24hr patch Place 1 patch (21 mg total) onto the skin daily. Patient not taking: Reported on 01/25/2018 11/15/17   Rolly Salter, MD  omeprazole (PRILOSEC) 20 MG capsule Take 1 capsule (20 mg total) by mouth daily. 02/22/18   Roxy Horseman, PA-C  protein supplement shake (PREMIER PROTEIN) LIQD Take 325 mLs (11 oz total) by mouth 2 (two) times daily between meals. Patient not taking: Reported on 01/25/2018 11/15/17   Rolly Salter, MD  sucralfate (CARAFATE) 1 g tablet Take 1 tablet (1 g total) by mouth 4 (four) times daily -  with meals and at bedtime. 02/22/18   Roxy Horseman, PA-C    Family History Family History  Problem Relation Age of Onset  . Diabetes Mother   . COPD Mother   . Heart disease Mother     Social History Social History   Tobacco Use  . Smoking status: Current Every Day Smoker    Packs/day: 1.00    Years: 25.00    Pack years: 25.00    Types: Cigarettes  . Smokeless tobacco: Never Used  Substance Use Topics  . Alcohol use: Yes    Alcohol/week: 0.6 oz    Types: 1 Cans of beer per week    Comment: one beer weekly  . Drug use: Yes    Types: Marijuana, Cocaine    Comment: THC daily, Cocaine daily     Allergies   Patient has no known allergies.   Review of Systems Review of Systems  All other systems reviewed and are negative.    Physical Exam Updated Vital Signs BP (!) 153/75 (BP Location: Left Arm)   Pulse 81   Temp 98.9 F (37.2 C) (Oral)   Resp 16   Ht 1.778 m (5\' 10" )   Wt 66.7 kg  (147 lb)   SpO2 99%   BMI 21.09 kg/m   Physical Exam  Constitutional: He is oriented to person, place, and time. He appears well-developed and well-nourished.  Non-toxic appearance. No distress.  HENT:  Head: Normocephalic and atraumatic.  Eyes: Pupils are equal, round, and reactive to light. Conjunctivae, EOM and lids are normal.  Neck: Normal range of motion. Neck supple. No tracheal deviation present. No thyroid mass present.  Cardiovascular: Normal rate, regular rhythm and normal heart sounds. Exam reveals no gallop.  No murmur heard. Pulmonary/Chest: Effort normal and breath sounds normal. No stridor. No respiratory distress. He has no decreased breath sounds. He has no wheezes. He has no rhonchi. He has no rales.  Abdominal: Soft. Normal appearance and bowel sounds are normal.  He exhibits no distension. There is no tenderness. There is no rebound and no CVA tenderness.  Musculoskeletal: Normal range of motion. He exhibits no edema or tenderness.  Neurological: He is alert and oriented to person, place, and time. He has normal strength. No cranial nerve deficit or sensory deficit. GCS eye subscore is 4. GCS verbal subscore is 5. GCS motor subscore is 6.  Skin: Skin is warm and dry. No abrasion and no rash noted.  Psychiatric: His speech is normal and behavior is normal. His affect is blunt.  Nursing note and vitals reviewed.    ED Treatments / Results  Labs (all labs ordered are listed, but only abnormal results are displayed) Labs Reviewed  GLUCOSE, CAPILLARY - Abnormal; Notable for the following components:      Result Value   Glucose-Capillary >600 (*)    All other components within normal limits  GLUCOSE, CAPILLARY - Abnormal; Notable for the following components:   Glucose-Capillary >600 (*)    All other components within normal limits  GLUCOSE, CAPILLARY - Abnormal; Notable for the following components:   Glucose-Capillary 528 (*)    All other components within normal  limits  GLUCOSE, CAPILLARY - Abnormal; Notable for the following components:   Glucose-Capillary 165 (*)    All other components within normal limits  GLUCOSE, CAPILLARY - Abnormal; Notable for the following components:   Glucose-Capillary 268 (*)    All other components within normal limits  GLUCOSE, CAPILLARY - Abnormal; Notable for the following components:   Glucose-Capillary 526 (*)    All other components within normal limits  GLUCOSE, CAPILLARY - Abnormal; Notable for the following components:   Glucose-Capillary 495 (*)    All other components within normal limits  GLUCOSE, CAPILLARY - Abnormal; Notable for the following components:   Glucose-Capillary 539 (*)    All other components within normal limits  GLUCOSE, CAPILLARY - Abnormal; Notable for the following components:   Glucose-Capillary 510 (*)    All other components within normal limits  GLUCOSE, CAPILLARY - Abnormal; Notable for the following components:   Glucose-Capillary 502 (*)    All other components within normal limits  CBG MONITORING, ED - Abnormal; Notable for the following components:   Glucose-Capillary 163 (*)    All other components within normal limits  I-STAT CHEM 8, ED - Abnormal; Notable for the following components:   BUN 28 (*)    Glucose, Bld 160 (*)    Calcium, Ion 1.03 (*)    Hemoglobin 11.6 (*)    HCT 34.0 (*)    All other components within normal limits  CBG MONITORING, ED - Abnormal; Notable for the following components:   Glucose-Capillary 133 (*)    All other components within normal limits  CBC WITH DIFFERENTIAL/PLATELET  BASIC METABOLIC PANEL  URINALYSIS, ROUTINE W REFLEX MICROSCOPIC  BLOOD GAS, VENOUS  T3, FREE  T4, FREE  TSH  COMPREHENSIVE METABOLIC PANEL  LIPID PANEL  CBC WITH DIFFERENTIAL/PLATELET    EKG None  Radiology Dg Abd Acute W/chest  Result Date: 02/22/2018 CLINICAL DATA:  Nausea, vomiting and abdominal pain x2 days. EXAM: DG ABDOMEN ACUTE W/ 1V CHEST  COMPARISON:  CXR 11/13/2017, CT AP 05/06/2017 FINDINGS: There is no evidence of dilated bowel loops or free intraperitoneal air. No radiopaque calculi or other significant radiographic abnormality is seen. Pelvic phleboliths are present bilaterally. Heart size and mediastinal contours are within normal limits. Both lungs are clear. IMPRESSION: Negative abdominal radiographs.  No acute cardiopulmonary disease. Electronically Signed  By: Tollie Eth M.D.   On: 02/22/2018 02:58    Procedures Procedures (including critical care time)  Medications Ordered in ED Medications  acetaminophen (TYLENOL) tablet 650 mg (has no administration in time range)  alum & mag hydroxide-simeth (MAALOX/MYLANTA) 200-200-20 MG/5ML suspension 30 mL (has no administration in time range)  magnesium hydroxide (MILK OF MAGNESIA) suspension 30 mL (has no administration in time range)  traZODone (DESYREL) tablet 50 mg (has no administration in time range)  insulin aspart (novoLOG) injection 0-15 Units (15 Units Subcutaneous Given 02/23/18 1657)  insulin aspart (novoLOG) injection 0-5 Units (0 Units Subcutaneous Not Given 02/22/18 2226)  pneumococcal 23 valent vaccine (PNU-IMMUNE) injection 0.5 mL (has no administration in time range)  nicotine (NICODERM CQ - dosed in mg/24 hours) patch 21 mg (21 mg Transdermal Not Given 02/23/18 0931)  feeding supplement (GLUCERNA SHAKE) (GLUCERNA SHAKE) liquid 237 mL (237 mLs Oral Given 02/23/18 1505)  multivitamin with minerals tablet 1 tablet (1 tablet Oral Not Given 02/23/18 1002)  sertraline (ZOLOFT) tablet 50 mg (50 mg Oral Given 02/23/18 1229)  insulin aspart protamine- aspart (NOVOLOG MIX 70/30) injection 25 Units (25 Units Subcutaneous Given 02/23/18 1657)  ondansetron (ZOFRAN-ODT) disintegrating tablet 4 mg (4 mg Oral Given 02/23/18 1658)  sodium chloride 0.9 % bolus 1,000 mL (1,000 mLs Intravenous New Bag/Given 02/23/18 1833)  0.9 %  sodium chloride infusion ( Intravenous New Bag/Given  02/23/18 1833)  insulin aspart (novoLOG) injection 12 Units (12 Units Subcutaneous Given 02/22/18 1706)     Initial Impression / Assessment and Plan / ED Course  I have reviewed the triage vital signs and the nursing notes.  Pertinent labs & imaging results that were available during my care of the patient were reviewed by me and considered in my medical decision making (see chart for details).     Patient given IV fluids here and placed on glucose stabilizer.  Will be admitted to the hospital for difficult to control hyperglycemia  Final Clinical Impressions(s) / ED Diagnoses   Final diagnoses:  Hyperglycemia    ED Discharge Orders    None       Lorre Nick, MD 02/23/18 1924

## 2018-02-23 NOTE — Progress Notes (Signed)
[facility]  Pt provided with a salad with chicken and sugar free cookies.  Pt is alert and awake and anxious.  Pt sts car in ED was disappointing.  Pt was worried about his insulin coverage. Pt sts he is happy to be back.  Pt denies SI, HI and AVH and verbally contracts for safety.  Pt returns to room and is in bed. Pt remains safe at this time

## 2018-02-23 NOTE — H&P (Signed)
History and Physical    John Cox ZOX:[id number] DOB: [birth year] DOA: 02/22/2018  Referring MD/NP/PA: Dr. Bruce Donath  PCP: Patient, No Pcp Per   Outpatient Specialists: None  Patient coming from: Behavioral health center  Chief Complaint: Hypoglycemia  HPI: John Cox is a 47 y.o. male with medical history significant of cocaine abuse with cocaine induced mood disorder who is currently admitted to the behavioral health center but also has history of diabetes, hypertension and hyperlipidemia.  Patient has insulin-dependent diabetes and has been having significant hypoglycemia.  He was seen in the ER last night with blood sugars above 400.  He was treated with IV insulin plus his subcutaneous 70/30 insulin.  Patient was sent back to behavioral health but brought back again today with blood sugars above 500.  He is not in DKA.  He has no, consistent with honk.  Patient however is being admitted to the hospital to regulate his blood sugar which is now not regulated at behavior health center.  ED Course: Patient's vitals were stable.  Blood sugar is 407 at the moment with creatinine 1.47 hemoglobin 11.1 BUN 26 and CO2 23.  ABG showed pH of 7.348 with normal gap.  Patient is recommended for admission to regulate his blood sugar in-house.  Review of Systems: As per HPI otherwise 10 point review of systems negative.    Past Medical History:  Diagnosis Date  . AKI (acute kidney injury) (HCC) 01/25/2018  . Anxiety   . Bipolar disorder (HCC)   . Depression 2000  . Diabetes mellitus 1995  . Hyperglycemia 01/25/2018  . Nausea & vomiting   . Polysubstance abuse (HCC) 2012   relapse 2 wk ago   . Suicidal ideation     Past Surgical History:  Procedure Laterality Date  . NO PAST SURGERIES       reports that he has been smoking cigarettes.  He has a 25.00 pack-year smoking history. He has never used smokeless tobacco. He reports that he drinks about 0.6 oz of alcohol per week. He  reports that he has current or past drug history. Drugs: Marijuana and Cocaine.  No Known Allergies  Family History  Problem Relation Age of Onset  . Diabetes Mother   . COPD Mother   . Heart disease Mother     Prior to Admission medications   Medication Sig Start Date End Date Taking? Authorizing Provider  cyclobenzaprine (FLEXERIL) 10 MG tablet Take 1 tablet (10 mg total) by mouth 2 (two) times daily as needed for muscle spasms. Patient not taking: Reported on 01/25/2018 09/29/16   Dorena Bodo, NP  feeding supplement, ENSURE ENLIVE, (ENSURE ENLIVE) LIQD Take 237 mLs by mouth 2 (two) times daily between meals. Patient not taking: Reported on 02/22/2018 11/14/17   Rolly Salter, MD  insulin NPH-regular Human (NOVOLIN 70/30) (70-30) 100 UNIT/ML injection Inject 15 Units into the skin 2 (two) times daily with a meal. Kindly dispense 1 month supply either while with needles and syringes or insulin pen if qualifies or cheaper Patient taking differently: Inject 25 Units into the skin 2 (two) times daily with a meal. Kindly dispense 1 month supply either while with needles and syringes or insulin pen if qualifies or cheaper 01/26/18   Leroy Sea, MD  insulin regular (NOVOLIN R,HUMULIN R) 100 units/mL injection If BG 70 - 120: use 0 units, BG 121-150: 2 units,  BG 151-200: 3 units, BG 201-250: 5 units, BG 251-300: 8 units, BG 301-350: 11 units,  BG 351 - 400: 15 units.  Kindly dispense one month supply, syringes and needles as needed, pen if qualifies are cheaper. 01/26/18 01/26/19  Leroy Sea, MD  Insulin Syringe-Needle U-100 (SAFETY-GLIDE 0.3CC SYR 29GX1/2) 29G X 1/2" 0.3 ML MISC Use as directed. 05/18/14   Hongalgi, Maximino Greenland, MD  levofloxacin (LEVAQUIN) 750 MG tablet Take 1 tablet (750 mg total) by mouth daily. Patient not taking: Reported on 02/22/2018 01/26/18   Leroy Sea, MD  meloxicam (MOBIC) 15 MG tablet Take 1 tablet (15 mg total) by mouth daily. Patient not taking:  Reported on 01/25/2018 09/29/16   Dorena Bodo, NP  nicotine (NICODERM CQ - DOSED IN MG/24 HOURS) 21 mg/24hr patch Place 1 patch (21 mg total) onto the skin daily. Patient not taking: Reported on 01/25/2018 11/15/17   Rolly Salter, MD  omeprazole (PRILOSEC) 20 MG capsule Take 1 capsule (20 mg total) by mouth daily. 02/22/18   Roxy Horseman, PA-C  protein supplement shake (PREMIER PROTEIN) LIQD Take 325 mLs (11 oz total) by mouth 2 (two) times daily between meals. Patient not taking: Reported on 01/25/2018 11/15/17   Rolly Salter, MD  sucralfate (CARAFATE) 1 g tablet Take 1 tablet (1 g total) by mouth 4 (four) times daily -  with meals and at bedtime. 02/22/18   Roxy Horseman, PA-C    Physical Exam: Vitals:   02/23/18 1812 02/23/18 1830 02/23/18 1900 02/23/18 1930  BP: (!) 153/75 (!) 162/75 (!) 143/66 134/68  Pulse: 81 89 85 85  Resp: 16 14 14 15   Temp: 98.9 F (37.2 C)     TempSrc: Oral     SpO2: 99% 100% 99% 96%  Weight:      Height:          Constitutional: NAD, calm, comfortable Vitals:   02/23/18 1812 02/23/18 1830 02/23/18 1900 02/23/18 1930  BP: (!) 153/75 (!) 162/75 (!) 143/66 134/68  Pulse: 81 89 85 85  Resp: 16 14 14 15   Temp: 98.9 F (37.2 C)     TempSrc: Oral     SpO2: 99% 100% 99% 96%  Weight:      Height:       Eyes: PERRL, lids and conjunctivae normal ENMT: Mucous membranes are moist. Posterior pharynx clear of any exudate or lesions.Normal dentition.  Neck: normal, supple, no masses, no thyromegaly Respiratory: clear to auscultation bilaterally, no wheezing, no crackles. Normal respiratory effort. No accessory muscle use.  Cardiovascular: Regular rate and rhythm, no murmurs / rubs / gallops. No extremity edema. 2+ pedal pulses. No carotid bruits.  Abdomen: no tenderness, no masses palpated. No hepatosplenomegaly. Bowel sounds positive.  Musculoskeletal: no clubbing / cyanosis. No joint deformity upper and lower extremities. Good ROM, no  contractures. Normal muscle tone.  Skin: no rashes, lesions, ulcers. No induration Neurologic: CN 2-12 grossly intact. Sensation intact, DTR normal. Strength 5/5 in all 4.  Psychiatric:Depressed, Suicide ideation  Labs on Admission: I have personally reviewed following labs and imaging studies  CBC: Recent Labs  Lab 02/22/18 0207 02/22/18 1949 02/22/18 2338 02/23/18 1834  WBC 4.6 5.2  --  5.2  NEUTROABS  --   --   --  3.8  HGB 12.3* 11.6* 11.6* 11.1*  HCT 37.0* 35.6* 34.0* 34.1*  MCV 85.6 87.3  --  85.9  PLT 344 327  --  307   Basic Metabolic Panel: Recent Labs  Lab 02/22/18 0207 02/22/18 0744 02/22/18 1949 02/22/18 2338 02/23/18 1834  NA 137 141 136 135  137  K 4.9 3.9 4.6 4.3 4.0  CL 94* 105 99* 103 101  CO2 27 23 26   --  23  GLUCOSE 384* 259* 417* 160* 407*  BUN 24* 24* 25* 28* 26*  CREATININE 1.42* 1.44* 1.51* 1.10 1.47*  CALCIUM 9.1 8.2* 8.8*  --  8.5*   GFR: Estimated Creatinine Clearance: 58.6 mL/min (A) (by C-G formula based on SCr of 1.47 mg/dL (H)). Liver Function Tests: Recent Labs  Lab 02/22/18 0207  AST 24  ALT 54  ALKPHOS 73  BILITOT 1.8*  PROT 6.3*  ALBUMIN 3.5   Recent Labs  Lab 02/22/18 0207  LIPASE 20   No results for input(s): AMMONIA in the last 168 hours. Coagulation Profile: No results for input(s): INR, PROTIME in the last 168 hours. Cardiac Enzymes: No results for input(s): CKTOTAL, CKMB, CKMBINDEX, TROPONINI in the last 168 hours. BNP (last 3 results) No results for input(s): PROBNP in the last 8760 hours. HbA1C: No results for input(s): HGBA1C in the last 72 hours. CBG: Recent Labs  Lab 02/23/18 1243 02/23/18 1637 02/23/18 1709 02/23/18 1729 02/23/18 1940  GLUCAP 495* 539* 510* 502* 197*   Lipid Profile: No results for input(s): CHOL, HDL, LDLCALC, TRIG, CHOLHDL, LDLDIRECT in the last 72 hours. Thyroid Function Tests: No results for input(s): TSH, T4TOTAL, FREET4, T3FREE, THYROIDAB in the last 72 hours. Anemia  Panel: No results for input(s): VITAMINB12, FOLATE, FERRITIN, TIBC, IRON, RETICCTPCT in the last 72 hours. Urine analysis:    Component Value Date/Time   COLORURINE STRAW (A) 02/22/2018 0743   APPEARANCEUR CLEAR 02/22/2018 0743   LABSPEC 1.026 02/22/2018 0743   PHURINE 5.0 02/22/2018 0743   GLUCOSEU >=500 (A) 02/22/2018 0743   HGBUR NEGATIVE 02/22/2018 0743   HGBUR negative 05/12/2007 1355   BILIRUBINUR NEGATIVE 02/22/2018 0743   BILIRUBINUR negative 05/23/2014 1052   KETONESUR 80 (A) 02/22/2018 0743   PROTEINUR NEGATIVE 02/22/2018 0743   UROBILINOGEN 0.2 10/04/2014 1749   NITRITE NEGATIVE 02/22/2018 0743   LEUKOCYTESUR NEGATIVE 02/22/2018 0743   Sepsis Labs: @LABRCNTIP (procalcitonin:4,lacticidven:4) )No results found for this or any previous visit (from the past 240 hour(s)).   Radiological Exams on Admission: Dg Abd Acute W/chest  Result Date: 02/22/2018 CLINICAL DATA:  Nausea, vomiting and abdominal pain x2 days. EXAM: DG ABDOMEN ACUTE W/ 1V CHEST COMPARISON:  CXR 11/13/2017, CT AP 05/06/2017 FINDINGS: There is no evidence of dilated bowel loops or free intraperitoneal air. No radiopaque calculi or other significant radiographic abnormality is seen. Pelvic phleboliths are present bilaterally. Heart size and mediastinal contours are within normal limits. Both lungs are clear. IMPRESSION: Negative abdominal radiographs.  No acute cardiopulmonary disease. Electronically Signed   By: Tollie Eth M.D.   On: 02/22/2018 02:58     Assessment/Plan Principal Problem:   Hyperglycemia due to type 1 diabetes mellitus (HCC) Active Problems:   TOBACCO ABUSE   Suicidal ideation   Hyperlipidemia associated with type 2 diabetes mellitus (HCC)   MDD (major depressive disorder), recurrent severe, without psychosis (HCC)   Cocaine dependence with cocaine-induced mood disorder (HCC)   Hyperglycemia     #1 hyperglycemia: Due to poorly controlled type 1 diabetes.  Patient on 70/30 insulin 25  units twice a day.  We will admit him an increase 70/30 insulin to 35 units twice a day.  Aggressively hydrate the patient.  Add sliding scale insulin with a resistant scale.  Titrate the 70/30 insulin to achieve better glycemic control.  #2 mood disorder: We will continue with psychiatric  treatment including suicide precaution while in the hospital.  Once blood sugar is regulated patient will be returned to behavioral Health Center.  #3 polysubstance abuse: Including tobacco, cocaine use.  Nicotine patch will be added.  We will defer to psychiatry  #4 acute kidney injury: Hydrate patient on follow renal function closely.   DVT prophylaxis: Heparin Code Status: Full code Family Communication: No family available Disposition Plan: Back to behavioral Health Center Consults called: None Admission status: Inpatient Severity of Illness: The appropriate patient status for this patient is INPATIENT. Inpatient status is judged to be reasonable and necessary in order to provide the required intensity of service to ensure the patient's safety. The patient's presenting symptoms, physical exam findings, and initial radiographic and laboratory data in the context of their chronic comorbidities is felt to place them at high risk for further clinical deterioration. Furthermore, it is not anticipated that the patient will be medically stable for discharge from the hospital within 2 midnights of admission. The following factors support the patient status of inpatient.   " The patient's presenting symptoms include hyperglycemia. " The worrisome physical exam findings include psych with depression. " The initial radiographic and laboratory data are worrisome because of blood sugar of 500. " The chronic co-morbidities include type 1 diabetes.   * I certify that at the point of admission it is my clinical judgment that the patient will require inpatient hospital care spanning beyond 2 midnights from the point of  admission due to high intensity of service, high risk for further deterioration and high frequency of surveillance required.Lonia Blood*    GARBA,LAWAL MD [facility] Pager [phone]  If 7PM-7AM, please contact night-coverage [email or web address] Password [email or web address]  02/23/2018, 7:46 PM

## 2018-02-23 NOTE — Progress Notes (Signed)
Patient's CBG at 1200 was 526.  Patient was given 15 U novolog per MD.  MD was notified of CBG.  Patient would not take his 70/30 this morning.  Patient was given a meal and is encouraged to eat.  He is observed lying on the bed with his arm over his head.  Took another CBG and it has decreased to 495.

## 2018-02-23 NOTE — Progress Notes (Signed)
   Called by Dr. Jama Flavorsobos. We had discussed patient case earlier today. Patient's blood sugar remains elevated in the 500s, now having nausea and vomiting. I fear that patient may be going into DKA. He needs to be evaluated in the ED for workup and fluid resuscitated and receive IV insulin. He may require inpatient admission in stepdown unit once work up is complete.   Noralee StainJennifer Tayten Bergdoll, DO [facility] [email or web address] Password [email or web address] 02/23/2018, 5:30 PM

## 2018-02-23 NOTE — Progress Notes (Signed)
D: Patient is observed lying in bed.  He has refused his medications this morning, including his insulin.  He denies any physical distress or thoughts of self harm.  Patient returned from ED last night due to elevated blood sugar.  Patient presents with flat, blunted affect; he reports minimal withdrawal symptoms.  Will request diabetic consult per MD order.  A: Continue to monitor medication management and MD orders.  Safety checks completed every 15 minutes per protocol.  Offer support and encouragement as needed.  R: Patient has minimal interaction with staff and peers.  He is withdrawn and isolative.

## 2018-02-23 NOTE — Progress Notes (Signed)
   Called by Dr. Jama Flavorsobos regarding patient's elevated blood sugar. Patient has outpatient orders for novolog mix 70/30 15u BID (but pharmacy notes he has been taking it 25u BID) as well as novolog sliding scale insulin. Patient's blood sugar has ranged from 133-495. He is currently on carb modified diet. Currently, he is on novolog mix 70/30 20u BID as well as moderate correction coverage novolog TID with meals and at bedtime.   I recommend that he be increased to novolog mix 70/30 25u BID, continue moderate coverage sliding scale insulin. There is also a nursing note that he declined his 70/30 this morning; his blood sugars will be difficult to control if he refuses his insulin. Diabetic coordinator has been consulted. Changes to medication ordered.    Noralee StainJennifer Oliviana Mcgahee, DO [facility] [email or web address] Password [email or web address] 02/23/2018, 1:02 PM

## 2018-02-23 NOTE — Progress Notes (Signed)
Recreation Therapy Notes  Animal-Assisted Activity (AAA) Program Checklist/Progress Notes Patient Eligibility Criteria Checklist & Daily Group note for Rec Tx Intervention  Date: 6.25.19 Time: 1430 [address]   AAA/T Program Assumption of Risk Form signed by Engineer, productionatient/ or Parent Legal Guardian  YES   Patient is free of allergies or sever asthma  YES  Patient reports no fear of animals YES   Patient reports no history of cruelty to animals YES   Patient understands his/her participation is voluntary YES   Patient washes hands before animal contact YES   Patient washes hands after animal contact YES   Education: Charity fundraiserHand Washing, Appropriate Animal Interaction   Education Outcome: Acknowledges understanding/In group clarification offered/Needs additional education.   Clinical Observations/Feedback: Pt did not attend group.    Caroll RancherMarjette Ari Engelbrecht, LRT/CTRS         Caroll RancherLindsay, Leiyah Maultsby A 02/23/2018 4:03 PM

## 2018-02-23 NOTE — Progress Notes (Signed)
Patient's CBGs have remained elevated all day.  Patient has been bedridden all day; refused his 70/30 this morning.  CBG was over 500 at lunch time.  He was given 15 U novolog.  Patient attempted to eat some of his salad.  Patient started throwing up around 1630.  He also became diaphoretic and lethargic.  Patient was able to tell me where he is, however, doesn't seem as oriented to day/person/time.  His answers are sluggish and he appears confused at times.  The lowest CBG today was 495 at lunch time.  Patient was given 15 U novolog at 1700, along with 25 U 70/30.  Last CBG taken was 508.  Discussed POC with Dr. Jama Flavorsobos.  Dr. Alvino Chapelhoi and Dr. Freida BusmanAllen was consulted.  Dr. Jama Flavorsobos gave verbal order to send patient to ED for assessment and stabilization.  Patient may be admitted to med floor.

## 2018-02-23 NOTE — Progress Notes (Signed)
Report received from nurse Belenda CruiseKristin.  Pt BS controlled to 133 after receiving 20 u of 70/30 and 12 u novolog.  Pt vital signs within normal limits.  [facility]
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
## 2018-02-23 NOTE — ED Notes (Signed)
Bed: WA15 Expected date:  Expected time:  Means of arrival:  Comments: EMS  

## 2018-02-23 NOTE — Progress Notes (Signed)
Pt did not attend goals and orientation group this morning  

## 2018-02-23 NOTE — [facility] Counselor (Signed)
Adult Comprehensive Assessment  Patient ID: John Cox, male   DOB: 08-Dec-1970, 47 y.o.   MRN: [id number]  Information Source: Information source: Patient  Current Stressors:  Patient states their primary concerns and needs for treatment are:: to get help with depression and feeling hopeless. Patient states their goals for this hospitilization and ongoing recovery are:: to sober up and get put on medication to help with my depression Educational / Learning stressors: some college Employment / Job issues: unemployed for several months.  Family Relationships: lives with mother; fair relationship with her. 3 kids-"all grown." Surveyor, quantity / Lack of resources (include bankruptcy): no income; no insurance currently Housing / Lack of housing: lives with mother Physical health (include injuries & life threatening diseases): diabetic CBG 600 on admission. reports extreme weight loss recently Social relationships: poor Substance abuse: crack cocaine every few days; marijuana daily; minimal alcohol use Bereavement / Loss: pt reports he broke up with girlfriend of 13 years about 3 months ago. "Things have really gone downhill since then."   Living/Environment/Situation:  Living Arrangements: Parent Living conditions (as described by patient or guardian): lives in home; [place] Who else lives in the home?: mother's home How long has patient lived in current situation?: on and off all his life.  What is atmosphere in current home: Comfortable, Supportive  Family History:  Marital status: Single Are you sexually active?: No What is your sexual orientation?: heterosexual Has your sexual activity been affected by drugs, alcohol, medication, or emotional stress?: n/a  Does patient have children?: Yes How many children?: 3 How is patient's relationship with their children?: "They are all grown." pt reports that he does not see his children often.   Childhood History:  By whom was/is the patient  raised?: Both parents Additional childhood history information: father was an alcoholic; mother was primary caretaker. parents divorced when pt was a child Description of patient's relationship with caregiver when they were a child: close to mother and father Patient's description of current relationship with people who raised him/her: close to mother; fair relationship with father. "He's in recovery now." How were you disciplined when you got in trouble as a child/adolescent?: n/a Does patient have siblings?: Yes Number of Siblings: 1 Description of patient's current relationship with siblings: pt has a brother who abuses drugs and has a schizophrenia diagnosis  Did patient suffer any verbal/emotional/physical/sexual abuse as a child?: No Did patient suffer from severe childhood neglect?: No Has patient ever been sexually abused/assaulted/raped as an adolescent or adult?: No Was the patient ever a victim of a crime or a disaster?: Yes Patient description of being a victim of a crime or disaster: pt reports that he has witnessed alot of traumatic events; violence. Nightmares and PTSD symtpoms reported  Witnessed domestic violence?: No Has patient been effected by domestic violence as an adult?: No  Education:  Highest grade of school patient has completed: some college Currently a Consulting civil engineer?: No Learning disability?: No  Employment/Work Situation:   Employment situation: Unemployed Patient's job has been impacted by current illness: Yes Describe how patient's job has been impacted: abusing drugs; hard to find motivation for work What is the longest time patient has a held a job?: few months-lost job 3 months ago.  Where was the patient employed at that time?: declined to answer  Did You Receive Any Psychiatric Treatment/Services While in the [employer]?: (no [employer]) Are There Guns or Other Weapons in Your Home?: No Are These Weapons Safely Secured?: (n/a)  Financial Resources:  Financial resources: Support from parents / caregiver Does patient have a Lawyerrepresentative payee or guardian?: No  Alcohol/Substance Abuse:   What has been your use of drugs/alcohol within the last 12 months?: crack cocane every other day and marijuana daily for several months. minimal alcohol consumption.  If attempted suicide, did drugs/alcohol play a role in this?: Yes(two overdose attempts in lifetime. ) Alcohol/Substance Abuse Treatment Hx: Past Tx, Inpatient If yes, describe treatment: "[facility]." he reports no outpatient mental health care.  Has alcohol/substance abuse ever caused legal problems?: No  Social Support System:   Forensic psychologistatient's Community Support System: Poor Describe Community Support System: few friends or supports Type of faith/religion: christian How does patient's faith help to cope with current illness?: n/a   Leisure/Recreation:   Leisure and Hobbies: "nothing."   Strengths/Needs:   What is the patient's perception of their strengths?: "not sure." Patient states they can use these personal strengths during their treatment to contribute to their recovery: "Not sure." Patient states these barriers may affect/interfere with their treatment: feeling suicidal and depressed Patient states these barriers may affect their return to the community: not feeling ready Other important information patient would like considered in planning for their treatment: none identified by pt.   Discharge Plan:   Currently receiving community mental health services: No Patient states concerns and preferences for aftercare planning are: [facility] Patient states they will know when they are safe and ready for discharge when: "I feel less depressed and more motivated."  Does patient have access to transportation?: Yes(bus) Does patient have financial barriers related to discharge medications?: Yes Patient description of barriers related to  discharge medications: no income; no insurance Will patient be returning to same living situation after discharge?: Yes(home with his mother. "I can return there.")  Summary/Recommendations:   Summary and Recommendations (to be completed by the evaluator): Patient is 47yo [place] [facility] with his mother. He presents to the hospital seeking treatment for SI, increased depression/mood lability, cocaine/marijuana abuse, and for medication stabilization. Patient currently denies SI/HI/AVH. He has a diagnosis of MDD and cocaine use disorder. Patient is single, unemployed, lives with his mother currently, and has 3 adult children. He reports recent breakup and job loss as primary stressors. Recommendations for patient include: crisis stabilization, medication management for detox/mood stabilization, group attendance and participation, and development of comprehensive mental wellness/sobriety plan. CSW assessing for appropriate referrals.   Rona RavensHeather S Shelsey Rieth LCSW 02/23/2018 10:50 AM

## 2018-02-23 NOTE — ED Notes (Signed)
Per EMS, [facility]. [facility] PT was given zofran for emesis 1 hour ago, pt no longer complains of nausea. Pt ambulatory, A&O.Marland Kitchen.   BP 154/90 HR 85 O2 100%

## 2018-02-23 NOTE — Progress Notes (Signed)
Inpatient Diabetes Program Recommendations  AACE/ADA: New Consensus Statement on Inpatient Glycemic Control (2015)  Target Ranges:  Prepandial:   less than 140 mg/dL      Peak postprandial:   less than 180 mg/dL (1-2 hours)      Critically ill patients:  140 - 180 mg/dL   Lab Results  Component Value Date   GLUCAP 495 (H) 02/23/2018   HGBA1C 11.4 (H) 01/25/2018    Review of Glycemic Control  Please see progress note dated 02/22/2018. Pt has Type 1 DM and needs basal insulin. Hospitalist consulted on 6/25. HgbA1C - 11.4%.  Ordered 70/30 25 units bid and Novolog 0-15 units tidwc.  Inpatient Diabetes Program Recommendations:     Decrease Novolog to 0-9 units tidwc and hs. Pt is Type 1 and sensitive to insulin.  Will follow.   Thank you. Ailene Ardshonda Osamu Olguin, RD, LDN, CDE Inpatient Diabetes Coordinator [phone]

## 2018-02-23 NOTE — [facility] Suicide Risk Assessment (Signed)
[facility] INPATIENT:  Family/Significant Other Suicide Prevention Education  Suicide Prevention Education:  Education Completed; John CornwallLinda Cox (pt's mother) [phone] has been identified by the patient as the family member/significant other with whom the patient will be residing, and identified as the person(s) who will aid the patient in the event of a mental health crisis (suicidal ideations/suicide attempt).  With written consent from the patient, the family member/significant other has been provided the following suicide prevention education, prior to the and/or following the discharge of the patient.  The suicide prevention education provided includes the following:  Suicide risk factors  Suicide prevention and interventions  National Suicide Hotline telephone number  [facility] assessment telephone number  [facility] Emergency Assistance 911  [facility] and/or Residential Mobile Crisis Unit telephone number  Request made of family/significant other to:  Remove weapons (e.g., guns, rifles, knives), all items previously/currently identified as safety concern.    Remove drugs/medications (over-the-counter, prescriptions, illicit drugs), all items previously/currently identified as a safety concern.  The family member/significant other verbalizes understanding of the suicide prevention education information provided.  The family member/significant other agrees to remove the items of safety concern listed above.  SPE and aftercare reviewed with pt's mother. She states that pt may return home at discharge but "I want him to stay at least a few days to get help for his depression. Sometimes he just comes home and cries. He's very depressed."   John RavensHeather S Lauralye Kinn LCSW 02/23/2018, 12:43 PM

## 2018-02-23 NOTE — ED Notes (Signed)
Called [facility] transportation to [facility]

## 2018-02-23 NOTE — [facility] Group Notes (Signed)
[facility] 02/23/2018 1:15pm  Type of Therapy: Mental Health Association Presentation  Participation Level: Invited. Chose to remain in bed.    Rona RavensHeather S Juanetta Negash, LCSW 02/23/2018 1:53 PM

## 2018-02-24 ENCOUNTER — Inpatient Hospital Stay (HOSPITAL_COMMUNITY)
Admission: EM | Admit: 2018-02-24 | Discharge: 2018-02-28 | DRG: 897 | Disposition: A | Discharge status: Other Acute Inpatient Hospital | Payer: Self-pay | Source: Ambulatory Visit | Attending: Internal Medicine | Admitting: Internal Medicine

## 2018-02-24 ENCOUNTER — Other Ambulatory Visit: Payer: Self-pay

## 2018-02-24 ENCOUNTER — Encounter (HOSPITAL_COMMUNITY): Payer: Self-pay

## 2018-02-24 DIAGNOSIS — K219 Gastro-esophageal reflux disease without esophagitis: Secondary | ICD-10-CM | POA: Diagnosis present

## 2018-02-24 DIAGNOSIS — Z8679 Personal history of other diseases of the circulatory system: Secondary | ICD-10-CM

## 2018-02-24 DIAGNOSIS — R739 Hyperglycemia, unspecified: Secondary | ICD-10-CM | POA: Diagnosis present

## 2018-02-24 DIAGNOSIS — R112 Nausea with vomiting, unspecified: Secondary | ICD-10-CM | POA: Diagnosis present

## 2018-02-24 DIAGNOSIS — E785 Hyperlipidemia, unspecified: Secondary | ICD-10-CM | POA: Diagnosis present

## 2018-02-24 DIAGNOSIS — F1424 Cocaine dependence with cocaine-induced mood disorder: Principal | ICD-10-CM | POA: Diagnosis present

## 2018-02-24 DIAGNOSIS — F332 Major depressive disorder, recurrent severe without psychotic features: Secondary | ICD-10-CM | POA: Diagnosis present

## 2018-02-24 DIAGNOSIS — F1910 Other psychoactive substance abuse, uncomplicated: Secondary | ICD-10-CM | POA: Diagnosis present

## 2018-02-24 DIAGNOSIS — N179 Acute kidney failure, unspecified: Secondary | ICD-10-CM | POA: Diagnosis present

## 2018-02-24 DIAGNOSIS — F17210 Nicotine dependence, cigarettes, uncomplicated: Secondary | ICD-10-CM | POA: Diagnosis present

## 2018-02-24 DIAGNOSIS — E10649 Type 1 diabetes mellitus with hypoglycemia without coma: Secondary | ICD-10-CM | POA: Diagnosis present

## 2018-02-24 DIAGNOSIS — E1169 Type 2 diabetes mellitus with other specified complication: Secondary | ICD-10-CM | POA: Diagnosis present

## 2018-02-24 DIAGNOSIS — E1069 Type 1 diabetes mellitus with other specified complication: Secondary | ICD-10-CM | POA: Diagnosis present

## 2018-02-24 DIAGNOSIS — R51 Headache: Secondary | ICD-10-CM | POA: Diagnosis present

## 2018-02-24 DIAGNOSIS — Z79899 Other long term (current) drug therapy: Secondary | ICD-10-CM

## 2018-02-24 DIAGNOSIS — E1065 Type 1 diabetes mellitus with hyperglycemia: Secondary | ICD-10-CM | POA: Diagnosis present

## 2018-02-24 DIAGNOSIS — R1013 Epigastric pain: Secondary | ICD-10-CM | POA: Diagnosis present

## 2018-02-24 DIAGNOSIS — F17200 Nicotine dependence, unspecified, uncomplicated: Secondary | ICD-10-CM | POA: Diagnosis present

## 2018-02-24 DIAGNOSIS — R45851 Suicidal ideations: Secondary | ICD-10-CM

## 2018-02-24 LAB — CBG MONITORING, ED: Glucose-Capillary: 99 mg/dL (ref 70–99)

## 2018-02-24 LAB — BASIC METABOLIC PANEL
Anion gap: 6 (ref 5–15)
BUN: 14 mg/dL (ref 6–20)
CHLORIDE: 104 mmol/L (ref 98–111)
CO2: 29 mmol/L (ref 22–32)
CREATININE: 1 mg/dL (ref 0.61–1.24)
Calcium: 8.3 mg/dL — ABNORMAL LOW (ref 8.9–10.3)
GFR calc Af Amer: >60 mL/min (ref >60)
GFR calc non Af Amer: >60 mL/min (ref >60)
GLUCOSE: 132 mg/dL — AB (ref 70–99)
POTASSIUM: 3.8 mmol/L (ref 3.5–5.1)
Sodium: 139 mmol/L (ref 135–145)

## 2018-02-24 LAB — CBC
HCT: 31.3 % — ABNORMAL LOW (ref 39.0–52.0)
HEMOGLOBIN: 10.3 g/dL — AB (ref 13.0–17.0)
MCH: 28.1 pg (ref 26.0–34.0)
MCHC: 32.9 g/dL (ref 30.0–36.0)
MCV: 85.5 fL (ref 78.0–100.0)
Platelets: 305 10*3/uL (ref 150–400)
RBC: 3.66 MIL/uL — AB (ref 4.22–5.81)
RDW: 15.3 % (ref 11.5–15.5)
WBC: 3.9 10*3/uL — ABNORMAL LOW (ref 4.0–10.5)

## 2018-02-24 LAB — GLUCOSE, CAPILLARY
GLUCOSE-CAPILLARY: 124 mg/dL — AB (ref 70–99)
GLUCOSE-CAPILLARY: 182 mg/dL — AB (ref 70–99)
GLUCOSE-CAPILLARY: 199 mg/dL — AB (ref 70–99)
GLUCOSE-CAPILLARY: 233 mg/dL — AB (ref 70–99)
GLUCOSE-CAPILLARY: 90 mg/dL (ref 70–99)
Glucose-Capillary: 130 mg/dL — ABNORMAL HIGH (ref 70–99)
Glucose-Capillary: 133 mg/dL — ABNORMAL HIGH (ref 70–99)
Glucose-Capillary: 435 mg/dL — ABNORMAL HIGH (ref 70–99)

## 2018-02-24 MED ORDER — ENOXAPARIN SODIUM 40 MG/0.4ML ~~LOC~~ SOLN
40.0000 mg | SUBCUTANEOUS | Status: DC
  Administered 2018-02-24 – 2018-02-28 (×5): 40 mg via SUBCUTANEOUS
  Filled 2018-02-24 (×5): qty 0.4

## 2018-02-24 MED ORDER — SODIUM CHLORIDE 0.9 % IV SOLN
INTRAVENOUS | Status: DC
  Administered 2018-02-24: 12:00:00 via INTRAVENOUS
  Administered 2018-02-24: 1000 mL via INTRAVENOUS
  Administered 2018-02-25: 04:00:00 via INTRAVENOUS

## 2018-02-24 MED ORDER — ONDANSETRON HCL 4 MG/2ML IJ SOLN: 4.0000 mg | Freq: Four times a day (QID) | INTRAMUSCULAR | Status: DC | PRN

## 2018-02-24 MED ORDER — ACETAMINOPHEN 325 MG PO TABS
650.0000 mg | ORAL_TABLET | Freq: Four times a day (QID) | ORAL | Status: DC | PRN
  Administered 2018-02-27: 650 mg via ORAL
  Filled 2018-02-24: qty 2

## 2018-02-24 MED ORDER — PANTOPRAZOLE SODIUM 40 MG PO TBEC
40.0000 mg | DELAYED_RELEASE_TABLET | Freq: Every day | ORAL | Status: DC
  Administered 2018-02-24 – 2018-02-28 (×5): 40 mg via ORAL
  Filled 2018-02-24 (×5): qty 1

## 2018-02-24 MED ORDER — INSULIN ASPART PROT & ASPART (70-30 MIX) 100 UNIT/ML ~~LOC~~ SUSP
25.0000 [IU] | Freq: Two times a day (BID) | SUBCUTANEOUS | Status: DC
  Administered 2018-02-24: 25 [IU] via SUBCUTANEOUS
  Filled 2018-02-24: qty 10

## 2018-02-24 MED ORDER — INSULIN ASPART 100 UNIT/ML ~~LOC~~ SOLN
10.0000 [IU] | SUBCUTANEOUS | Status: AC
  Administered 2018-02-24: 10 [IU] via SUBCUTANEOUS

## 2018-02-24 MED ORDER — SUCRALFATE 1 G PO TABS
1.0000 g | ORAL_TABLET | Freq: Three times a day (TID) | ORAL | Status: DC
  Administered 2018-02-24 – 2018-02-28 (×17): 1 g via ORAL
  Filled 2018-02-24 (×17): qty 1

## 2018-02-24 MED ORDER — HYDROCODONE-ACETAMINOPHEN 5-325 MG PO TABS
1.0000 | ORAL_TABLET | Freq: Four times a day (QID) | ORAL | Status: DC | PRN
  Administered 2018-02-24: 1 via ORAL
  Administered 2018-02-24 – 2018-02-25 (×2): 2 via ORAL
  Filled 2018-02-24: qty 2
  Filled 2018-02-24: qty 1
  Filled 2018-02-24: qty 2

## 2018-02-24 MED ORDER — INSULIN ASPART PROT & ASPART (70-30 MIX) 100 UNIT/ML ~~LOC~~ SUSP
15.0000 [IU] | Freq: Two times a day (BID) | SUBCUTANEOUS | Status: DC
  Filled 2018-02-24: qty 10

## 2018-02-24 MED ORDER — INSULIN ASPART 100 UNIT/ML ~~LOC~~ SOLN
0.0000 [IU] | SUBCUTANEOUS | Status: DC
  Administered 2018-02-24: 3 [IU] via SUBCUTANEOUS
  Administered 2018-02-24: 2 [IU] via SUBCUTANEOUS
  Administered 2018-02-24: 1 [IU] via SUBCUTANEOUS
  Administered 2018-02-24 – 2018-02-25 (×3): 2 [IU] via SUBCUTANEOUS
  Administered 2018-02-25 – 2018-02-26 (×2): 1 [IU] via SUBCUTANEOUS
  Administered 2018-02-26 (×2): 2 [IU] via SUBCUTANEOUS
  Administered 2018-02-26: 5 [IU] via SUBCUTANEOUS
  Administered 2018-02-26: 2 [IU] via SUBCUTANEOUS
  Administered 2018-02-27: 1 [IU] via SUBCUTANEOUS
  Administered 2018-02-27: 3 [IU] via SUBCUTANEOUS

## 2018-02-24 NOTE — Progress Notes (Signed)
PROGRESS NOTE  John Cox ZOX:[id number] DOB: 1971/06/29 DOA: 02/24/2018 PCP: Patient, No Pcp Per  HPI/Recap of past 24 hours: John Cox is a 47 year old male old male with past medical history significant for type 1 diabetes, cocaine induced mood disorder, major depressive disorder who was initially admitted at the behavioral health center.  Due to uncontrolled hyperglycemia was transferred to [facility] for further assessment and treatment of his uncontrolled hyperglycemia.  02/24/2018: Patient seen and examined at his bedside.  He reports epigastric pain and intermittent nausea.   Lipase negative at 20.  Restarted his home dose PPI and sucralfate.  If persistent may consider consulting GI. Also reports headache 6 out of 10 frontal and dull in nature.   Assessment/Plan: Active Problems:   TOBACCO ABUSE   Suicidal ideation   Hyperlipidemia associated with type 2 diabetes mellitus (HCC)   Hyperglycemia due to type 1 diabetes mellitus (HCC)   Hyperglycemia  Uncontrolled type 1 diabetes Anion gap 6 Appears that on 02/22/2018 anion gap was 16 Restart home dose insulin Continue insulin sliding scale Continue blood sugar monitoring Heart healthy diabetic diet Last A1c 11.4 on 01/25/2018 Monitor anion gap, blood sugar and chemistry bicarb Repeat BMP in the morning  Intractable nausea/epigastric pain Lipase negative at 24 Suspect gastroparesis If persistent may consider consulting GI IV Zofran as needed for nausea Restart home PPI and sucralfate  Headaches Tylenol as needed for mild to moderate pain Headache cocktail as needed  GERD Continue PPI Continue Carafate   Code Status: Full code  Family Communication: None at bedside  Disposition Plan: Back to behavioral health when clinically stable   Consultants:  Diabetes coordinator  Procedures:  None  Antimicrobials:  None  DVT prophylaxis: Subcu Lovenox daily   Objective: Vitals:   02/23/18 2302 02/24/18 0205 02/24/18  0539  BP: (!) 172/68  137/74  Pulse: 85  71  Resp: 16  18  Temp: 99.1 F (37.3 C)  98.6 F (37 C)  TempSrc: Oral  Oral  SpO2: 100%  97%  Weight:  68.4 kg (150 lb 12.7 oz)   Height:  5\' 10"  (1.778 m)     Intake/Output Summary (Last 24 hours) at 02/24/2018 1137 Last data filed at 02/24/2018 1000 Gross per 24 hour  Intake 1056.25 ml  Output 1600 ml  Net -543.75 ml   Filed Weights   02/24/18 0205  Weight: 68.4 kg (150 lb 12.7 oz)    Exam:  . General: 47 y.o. year-old male well developed well nourished in no acute distress.  Alert and oriented x3. . Cardiovascular: Regular rate and rhythm with no rubs or gallops.  No thyromegaly or JVD noted.   Marland Kitchen Respiratory: Clear to auscultation with no wheezes or rales. Good inspiratory effort. . Abdomen: Soft mild tenderness on palpation of epigastric region.  Nondistended with normal bowel sounds x4 quadrants. . Musculoskeletal: No lower extremity edema. 2/4 pulses in all 4 extremities. . Skin: No ulcerative lesions noted or rashes, . Psychiatry: Mood is appropriate for condition and setting   Data Reviewed: CBC: Recent Labs  Lab 02/22/18 0207 02/22/18 1949 02/22/18 2338 02/23/18 1834 02/24/18 1038  WBC 4.6 5.2  --  5.2 3.9*  NEUTROABS  --   --   --  3.8  --   HGB 12.3* 11.6* 11.6* 11.1* 10.3*  HCT 37.0* 35.6* 34.0* 34.1* 31.3*  MCV 85.6 87.3  --  85.9 85.5  PLT 344 327  --  307 305   Basic Metabolic Panel: Recent Labs  Lab 02/22/18 0207 02/22/18 0744 02/22/18 1949 02/22/18 2338 02/23/18 1834 02/24/18 0813  NA 137 141 136 135 137 139  K 4.9 3.9 4.6 4.3 4.0 3.8  CL 94* 105 99* 103 101 104  CO2 27 23 26   --  23 29  GLUCOSE 384* 259* 417* 160* 407* 132*  BUN 24* 24* 25* 28* 26* 14  CREATININE 1.42* 1.44* 1.51* 1.10 1.47* 1.00  CALCIUM 9.1 8.2* 8.8*  --  8.5* 8.3*   GFR: Estimated Creatinine Clearance: 88.4 mL/min (by C-G formula based on SCr of 1 mg/dL). Liver Function Tests: Recent Labs  Lab 02/22/18 0207  AST  24  ALT 54  ALKPHOS 73  BILITOT 1.8*  PROT 6.3*  ALBUMIN 3.5   Recent Labs  Lab 02/22/18 0207  LIPASE 20   No results for input(s): AMMONIA in the last 168 hours. Coagulation Profile: No results for input(s): INR, PROTIME in the last 168 hours. Cardiac Enzymes: No results for input(s): CKTOTAL, CKMB, CKMBINDEX, TROPONINI in the last 168 hours. BNP (last 3 results) No results for input(s): PROBNP in the last 8760 hours. HbA1C: No results for input(s): HGBA1C in the last 72 hours. CBG: Recent Labs  Lab 02/23/18 2220 02/24/18 0154 02/24/18 0405 02/24/18 0558 02/24/18 0810  GLUCAP 99 199* 130* 182* 133*   Lipid Profile: No results for input(s): CHOL, HDL, LDLCALC, TRIG, CHOLHDL, LDLDIRECT in the last 72 hours. Thyroid Function Tests: No results for input(s): TSH, T4TOTAL, FREET4, T3FREE, THYROIDAB in the last 72 hours. Anemia Panel: No results for input(s): VITAMINB12, FOLATE, FERRITIN, TIBC, IRON, RETICCTPCT in the last 72 hours. Urine analysis:    Component Value Date/Time   COLORURINE STRAW (A) 02/22/2018 0743   APPEARANCEUR CLEAR 02/22/2018 0743   LABSPEC 1.026 02/22/2018 0743   PHURINE 5.0 02/22/2018 0743   GLUCOSEU >=500 (A) 02/22/2018 0743   HGBUR NEGATIVE 02/22/2018 0743   HGBUR negative 05/12/2007 1355   BILIRUBINUR NEGATIVE 02/22/2018 0743   BILIRUBINUR negative 05/23/2014 1052   KETONESUR 80 (A) 02/22/2018 0743   PROTEINUR NEGATIVE 02/22/2018 0743   UROBILINOGEN 0.2 10/04/2014 1749   NITRITE NEGATIVE 02/22/2018 0743   LEUKOCYTESUR NEGATIVE 02/22/2018 0743   Sepsis Labs: @LABRCNTIP (procalcitonin:4,lacticidven:4)  )No results found for this or any previous visit (from the past 240 hour(s)).    Studies: No results found.  Scheduled Meds: . enoxaparin (LOVENOX) injection  40 mg Subcutaneous Q24H  . insulin aspart  0-9 Units Subcutaneous Q4H  . insulin aspart protamine- aspart  15 Units Subcutaneous BID WC  . pantoprazole  40 mg Oral Daily  .  sucralfate  1 g Oral TID WC & HS    Continuous Infusions: . sodium chloride 75 mL/hr at 02/24/18 1000     LOS: 0 days     John Droparole N Ether Goebel, MD [facility] Pager [phone]  If 7PM-7AM, please contact night-coverage [email or web address] Password [email or web address] 02/24/2018, 11:37 AM

## 2018-02-24 NOTE — Progress Notes (Signed)
Late entry: Notified MD of cbg of 420 at 11:30. Noticed the machine did not transfer over cbg, it was retaken and the new cbg was 435. Orders were given.  Will continue to monitor.

## 2018-02-25 LAB — GLUCOSE, CAPILLARY
GLUCOSE-CAPILLARY: 162 mg/dL — AB (ref 70–99)
GLUCOSE-CAPILLARY: 156 mg/dL — AB (ref 70–99)
GLUCOSE-CAPILLARY: 158 mg/dL — AB (ref 70–99)
GLUCOSE-CAPILLARY: 120 mg/dL — AB (ref 70–99)
Glucose-Capillary: 38 mg/dL — CL (ref 70–99)
Glucose-Capillary: 153 mg/dL — ABNORMAL HIGH (ref 70–99)
Glucose-Capillary: 123 mg/dL — ABNORMAL HIGH (ref 70–99)

## 2018-02-25 LAB — BASIC METABOLIC PANEL
ANION GAP: 6 (ref 5–15)
BUN: 11 mg/dL (ref 6–20)
CALCIUM: 7.9 mg/dL — AB (ref 8.9–10.3)
CO2: 26 mmol/L (ref 22–32)
Chloride: 107 mmol/L (ref 98–111)
Creatinine, Ser: 0.83 mg/dL (ref 0.61–1.24)
GFR calc Af Amer: >60 mL/min (ref >60)
GFR calc non Af Amer: >60 mL/min (ref >60)
GLUCOSE: 44 mg/dL — AB (ref 70–99)
Potassium: 4.8 mmol/L (ref 3.5–5.1)
Sodium: 139 mmol/L (ref 135–145)

## 2018-02-25 MED ORDER — GI COCKTAIL ~~LOC~~
30.0000 mL | Freq: Once | ORAL | Status: AC
  Administered 2018-02-25: 30 mL via ORAL
  Filled 2018-02-25: qty 30

## 2018-02-25 MED ORDER — INSULIN ASPART PROT & ASPART (70-30 MIX) 100 UNIT/ML ~~LOC~~ SUSP
15.0000 [IU] | Freq: Two times a day (BID) | SUBCUTANEOUS | Status: DC
  Administered 2018-02-25 – 2018-02-28 (×7): 15 [IU] via SUBCUTANEOUS
  Filled 2018-02-25: qty 10

## 2018-02-25 MED ORDER — KETOROLAC TROMETHAMINE 30 MG/ML IJ SOLN
30.0000 mg | Freq: Once | INTRAMUSCULAR | Status: AC
Start: 2018-02-25 — End: 2018-02-25
  Administered 2018-02-25: 30 mg via INTRAVENOUS
  Filled 2018-02-25: qty 1

## 2018-02-25 MED ORDER — METOCLOPRAMIDE HCL 5 MG/ML IJ SOLN
5.0000 mg | Freq: Once | INTRAMUSCULAR | Status: AC
  Administered 2018-02-25: 5 mg via INTRAVENOUS
  Filled 2018-02-25: qty 2

## 2018-02-25 MED ORDER — DEXTROSE 50 % IV SOLN
INTRAVENOUS | Status: AC
  Administered 2018-02-25: 50 mL
  Filled 2018-02-25: qty 50

## 2018-02-25 MED ORDER — DIPHENHYDRAMINE HCL 50 MG/ML IJ SOLN
25.0000 mg | Freq: Once | INTRAMUSCULAR | Status: AC
  Administered 2018-02-25: 25 mg via INTRAVENOUS
  Filled 2018-02-25: qty 1

## 2018-02-25 MED ORDER — HYDROCODONE-ACETAMINOPHEN 5-325 MG PO TABS
1.0000 | ORAL_TABLET | Freq: Four times a day (QID) | ORAL | Status: DC | PRN
  Administered 2018-02-25 – 2018-02-26 (×3): 2 via ORAL
  Administered 2018-02-27: 1 via ORAL
  Administered 2018-02-27: 2 via ORAL
  Administered 2018-02-27: 1 via ORAL
  Administered 2018-02-28: 2 via ORAL
  Filled 2018-02-25 (×6): qty 2
  Filled 2018-02-25: qty 1

## 2018-02-25 NOTE — Progress Notes (Signed)
Pt blood sugar decreased over night to 38. Pt blood sugar 158 this morning MD notified about changing insulin dose. MD decreased dose to 15 units.

## 2018-02-25 NOTE — Progress Notes (Signed)
PROGRESS NOTE  John LernerWilliam B Cox WUJ:[id number] DOB: 18-Jan-1971 DOA: 02/24/2018 PCP: Patient, No Pcp Per  HPI/Recap of past 24 hours: [name] is a 47 year old male with past medical history significant for type 1 diabetes, cocaine induced mood disorder, major depressive disorder who was initially admitted at the behavioral health center.  Due to uncontrolled hyperglycemia [facility] further assessment and treatment of his uncontrolled hyperglycemia.  02/24/2018: Patient seen and examined at his bedside.  He reports epigastric pain and intermittent nausea.   Lipase negative at 20.  Restarted his home dose PPI and sucralfate.  If persistent may consider consulting GI. Also reports headache 6 out of 10 frontal and dull in nature.   02/25/2018: Hypoglycemic overnight with blood sugar of 38.  Reports epigastric pain and headache.  GI cocktail and headache cocktail given with improvement of symptoms.  Assessment/Plan: Active Problems:   TOBACCO ABUSE   Suicidal ideation   Hyperlipidemia associated with type 2 diabetes mellitus (HCC)   Hyperglycemia due to type 1 diabetes mellitus (HCC)   Hyperglycemia  Uncontrolled type 1 diabetes Anion gap 6 Appears that on 02/22/2018 anion gap was 16 Restart home dose insulin Continue insulin sliding scale Continue blood sugar monitoring Heart healthy diabetic diet Last A1c 11.4 on 01/25/2018 Monitor anion gap, blood sugar and chemistry bicarb Repeat BMP in the morning  Hypoglycemia, iatrogenic Insulin dose decreased Continue to monitor closely  Intractable nausea/epigastric pain Lipase negative at 24 Suspect gastroparesis If persistent may consider consulting GI IV Zofran as needed for nausea Restart home PPI and sucralfate GI cocktail given with improvement of symptoms  Headaches Tylenol as needed for mild to moderate pain Headache cocktail given with improvement of symptoms  GERD Continue PPI Continue Carafate   Code  Status: Full code  Family Communication: None at bedside  Disposition Plan: Back to behavioral health when clinically stable   Consultants:  Diabetes coordinator  Procedures:  None  Antimicrobials:  None  DVT prophylaxis: Subcu Lovenox daily   Objective: Vitals:   02/24/18 0539 02/24/18 2101 02/25/18 0519 02/25/18 1408  BP: 137/74 131/71 121/67 118/74  Pulse: 71 73 70 66  Resp: 18 18 18 12   Temp: 98.6 F (37 C) 98.6 F (37 C) 98.4 F (36.9 C) 98.4 F (36.9 C)  TempSrc: Oral Oral Oral Oral  SpO2: 97% 98% 100% 100%  Weight:      Height:        Intake/Output Summary (Last 24 hours) at 02/25/2018 1601 Last data filed at 02/25/2018 1518 Gross per 24 hour  Intake 3432.5 ml  Output 2100 ml  Net 1332.5 ml   Filed Weights   02/24/18 0205  Weight: 68.4 kg (150 lb 12.7 oz)    Exam:  . General: 47 y.o. year-old male well-developed well-nourished no acute distress.  Alert and oriented x3.   . Cardiovascular: Regular rate and rhythm with no rubs or gallops.  No thyromegaly or JVD noted. Marland Kitchen. Respiratory: Clear to auscultation with no wheezes or rales. Good inspiratory effort. . Abdomen: Soft mild tenderness on palpation of epigastric region.  Nondistended with normal bowel sounds x4 quadrants. . Musculoskeletal: No lower extremity edema. 2/4 pulses in all 4 extremities. . Skin: No ulcerative lesions noted or rashes, . Psychiatry: Mood is appropriate for condition and setting   Data Reviewed: CBC: Recent Labs  Lab 02/22/18 0207 02/22/18 1949 02/22/18 2338 02/23/18 1834 02/24/18 1038  WBC 4.6 5.2  --  5.2 3.9*  NEUTROABS  --   --   --  3.8  --   HGB 12.3* 11.6* 11.6* 11.1* 10.3*  HCT 37.0* 35.6* 34.0* 34.1* 31.3*  MCV 85.6 87.3  --  85.9 85.5  PLT 344 327  --  307 305   Basic Metabolic Panel: Recent Labs  Lab 02/22/18 0744 02/22/18 1949 02/22/18 2338 02/23/18 1834 02/24/18 0813 02/25/18 0356  NA 141 136 135 137 139 139  K 3.9 4.6 4.3 4.0 3.8 4.8    CL 105 99* 103 101 104 107  CO2 23 26  --  23 29 26   GLUCOSE 259* 417* 160* 407* 132* 44*  BUN 24* 25* 28* 26* 14 11  CREATININE 1.44* 1.51* 1.10 1.47* 1.00 0.83  CALCIUM 8.2* 8.8*  --  8.5* 8.3* 7.9*   GFR: Estimated Creatinine Clearance: 106.4 mL/min (by C-G formula based on SCr of 0.83 mg/dL). Liver Function Tests: Recent Labs  Lab 02/22/18 0207  AST 24  ALT 54  ALKPHOS 73  BILITOT 1.8*  PROT 6.3*  ALBUMIN 3.5   Recent Labs  Lab 02/22/18 0207  LIPASE 20   No results for input(s): AMMONIA in the last 168 hours. Coagulation Profile: No results for input(s): INR, PROTIME in the last 168 hours. Cardiac Enzymes: No results for input(s): CKTOTAL, CKMB, CKMBINDEX, TROPONINI in the last 168 hours. BNP (last 3 results) No results for input(s): PROBNP in the last 8760 hours. HbA1C: No results for input(s): HGBA1C in the last 72 hours. CBG: Recent Labs  Lab 02/25/18 0405 02/25/18 0423 02/25/18 0633 02/25/18 0730 02/25/18 1202  GLUCAP 38* 162* 156* 158* 153*   Lipid Profile: No results for input(s): CHOL, HDL, LDLCALC, TRIG, CHOLHDL, LDLDIRECT in the last 72 hours. Thyroid Function Tests: No results for input(s): TSH, T4TOTAL, FREET4, T3FREE, THYROIDAB in the last 72 hours. Anemia Panel: No results for input(s): VITAMINB12, FOLATE, FERRITIN, TIBC, IRON, RETICCTPCT in the last 72 hours. Urine analysis:    Component Value Date/Time   COLORURINE STRAW (A) 02/22/2018 0743   APPEARANCEUR CLEAR 02/22/2018 0743   LABSPEC 1.026 02/22/2018 0743   PHURINE 5.0 02/22/2018 0743   GLUCOSEU >=500 (A) 02/22/2018 0743   HGBUR NEGATIVE 02/22/2018 0743   HGBUR negative 05/12/2007 1355   BILIRUBINUR NEGATIVE 02/22/2018 0743   BILIRUBINUR negative 05/23/2014 1052   KETONESUR 80 (A) 02/22/2018 0743   PROTEINUR NEGATIVE 02/22/2018 0743   UROBILINOGEN 0.2 10/04/2014 1749   NITRITE NEGATIVE 02/22/2018 0743   LEUKOCYTESUR NEGATIVE 02/22/2018 0743   Sepsis  Labs: @LABRCNTIP (procalcitonin:4,lacticidven:4)  )No results found for this or any previous visit (from the past 240 hour(s)).    Studies: No results found.  Scheduled Meds: . enoxaparin (LOVENOX) injection  40 mg Subcutaneous Q24H  . insulin aspart  0-9 Units Subcutaneous Q4H  . insulin aspart protamine- aspart  15 Units Subcutaneous BID WC  . pantoprazole  40 mg Oral Daily  . sucralfate  1 g Oral TID WC & HS    Continuous Infusions:    LOS: 1 day     Darlin Drop, MD [facility] Pager [phone]  If 7PM-7AM, please contact night-coverage [email or web address] Password [facility] 02/25/2018, 4:01 PM

## 2018-02-26 LAB — COMPREHENSIVE METABOLIC PANEL
ALBUMIN: 2.7 g/dL — AB (ref 3.5–5.0)
ALK PHOS: 53 U/L (ref 38–126)
ALT: 51 U/L — ABNORMAL HIGH (ref 0–44)
ANION GAP: 3 — AB (ref 5–15)
AST: 73 U/L — ABNORMAL HIGH (ref 15–41)
BUN: 13 mg/dL (ref 6–20)
CHLORIDE: 106 mmol/L (ref 98–111)
CO2: 32 mmol/L (ref 22–32)
Calcium: 8.1 mg/dL — ABNORMAL LOW (ref 8.9–10.3)
Creatinine, Ser: 0.93 mg/dL (ref 0.61–1.24)
GFR calc non Af Amer: >60 mL/min (ref >60)
GLUCOSE: 88 mg/dL (ref 70–99)
Potassium: 3.8 mmol/L (ref 3.5–5.1)
Sodium: 141 mmol/L (ref 135–145)
Total Bilirubin: 0.6 mg/dL (ref 0.3–1.2)
Total Protein: 4.9 g/dL — ABNORMAL LOW (ref 6.5–8.1)

## 2018-02-26 LAB — GLUCOSE, CAPILLARY
GLUCOSE-CAPILLARY: 184 mg/dL — AB (ref 70–99)
Glucose-Capillary: 72 mg/dL (ref 70–99)
Glucose-Capillary: 296 mg/dL — ABNORMAL HIGH (ref 70–99)
Glucose-Capillary: 130 mg/dL — ABNORMAL HIGH (ref 70–99)
Glucose-Capillary: 193 mg/dL — ABNORMAL HIGH (ref 70–99)
Glucose-Capillary: 170 mg/dL — ABNORMAL HIGH (ref 70–99)

## 2018-02-26 LAB — CBC
HCT: 31.5 % — ABNORMAL LOW (ref 39.0–52.0)
HEMOGLOBIN: 10.2 g/dL — AB (ref 13.0–17.0)
MCH: 27.9 pg (ref 26.0–34.0)
MCHC: 32.4 g/dL (ref 30.0–36.0)
MCV: 86.3 fL (ref 78.0–100.0)
PLATELETS: 273 10*3/uL (ref 150–400)
RBC: 3.65 MIL/uL — AB (ref 4.22–5.81)
RDW: 15.2 % (ref 11.5–15.5)
WBC: 3.2 10*3/uL — ABNORMAL LOW (ref 4.0–10.5)

## 2018-02-26 NOTE — Progress Notes (Signed)
PROGRESS NOTE  John Cox:[id number] DOB: Nov 23, 1970 DOA: 02/24/2018 PCP: Patient, No Pcp Per  HPI/Recap of past 24 hours: John Cox is a 47 year old male with past medical history significant for type 1 diabetes, cocaine induced mood disorder, major depressive disorder who was initially admitted at the behavioral health center.  Due to uncontrolled hyperglycemia was transferred to [facility] for further assessment and treatment of his uncontrolled hyperglycemia.  02/24/2018: Patient seen and examined at his bedside.  He reports epigastric pain and intermittent nausea.   Lipase negative at 20.  Restarted his home dose PPI and sucralfate.  If persistent may consider consulting GI. Also reports headache 6 out of 10 frontal and dull in nature.   02/25/2018: Hypoglycemic overnight with blood sugar of 38.  Reports epigastric pain and headache.  GI cocktail and headache cocktail given with improvement of symptoms.  02/26/18: Per nursing labile blood sugar. Continue to titrate insulin to avoid hypoglycemia. No new complaints.  Assessment/Plan: Active Problems:   TOBACCO ABUSE   Suicidal ideation   Hyperlipidemia associated with type 2 diabetes mellitus (HCC)   Hyperglycemia due to type 1 diabetes mellitus (HCC)   Hyperglycemia  Uncontrolled type 1 diabetes Anion gap 6 Appears that on 02/22/2018 anion gap was 16 Restart home dose insulin Continue insulin sliding scale Continue blood sugar monitoring Heart healthy diabetic diet Last A1c 11.4 on 01/25/2018 Monitor anion gap, blood sugar and chemistry bicarb Repeat BMP in the morning  Hypoglycemia with labile blood glucose Insulin dose decreased Continue to monitor closely  Intractable nausea/epigastric pain Lipase negative at 24 Suspect gastroparesis If persistent may consider consulting GI IV Zofran as needed for nausea Restart home PPI and sucralfate GI cocktail given with improvement of symptoms  Headaches Tylenol as needed for  mild to moderate pain Headache cocktail given with improvement of symptoms  GERD Continue PPI Continue Carafate   Code Status: Full code  Family Communication: None at bedside  Disposition Plan: Back to behavioral health when clinically stable and blood sugar is well controlled.   Consultants:  Diabetes coordinator  Procedures:  None  Antimicrobials:  None  DVT prophylaxis: Subcu Lovenox daily   Objective: Vitals:   02/24/18 2101 02/25/18 0519 02/25/18 1408 02/26/18 0534  BP: 131/71 121/67 118/74 130/88  Pulse: 73 70 66 74  Resp: 18 18 12 18   Temp: 98.6 F (37 C) 98.4 F (36.9 C) 98.4 F (36.9 C) 97.9 F (36.6 C)  TempSrc: Oral Oral Oral Oral  SpO2: 98% 100% 100% 99%  Weight:      Height:        Intake/Output Summary (Last 24 hours) at 02/26/2018 1342 Last data filed at 02/26/2018 1013 Gross per 24 hour  Intake 805 ml  Output 2250 ml  Net -1445 ml   Filed Weights   02/24/18 0205  Weight: 68.4 kg (150 lb 12.7 oz)    Exam:  . General: 47 y.o. year-old male WD wN NAd A&O x 3 . Cardiovascular: RRR no rubs or gallops. No JVD or thyromegaly. Marland Kitchen Respiratory: Clear to auscultation with no wheezes or rales. Good inspiratory effort. . Abdomen: Soft mild tenderness on palpation of epigastric region.  Nondistended with normal bowel sounds x4 quadrants. . Musculoskeletal: No lower extremity edema. 2/4 pulses in all 4 extremities. . Skin: No ulcerative lesions noted or rashes, . Psychiatry: Mood is appropriate for condition and setting   Data Reviewed: CBC: Recent Labs  Lab 02/22/18 0207 02/22/18 1949 02/22/18 2338 02/23/18 1834 02/24/18 1038  02/26/18 0443  WBC 4.6 5.2  --  5.2 3.9* 3.2*  NEUTROABS  --   --   --  3.8  --   --   HGB 12.3* 11.6* 11.6* 11.1* 10.3* 10.2*  HCT 37.0* 35.6* 34.0* 34.1* 31.3* 31.5*  MCV 85.6 87.3  --  85.9 85.5 86.3  PLT 344 327  --  307 305 273   Basic Metabolic Panel: Recent Labs  Lab 02/22/18 1949 02/22/18 2338  02/23/18 1834 02/24/18 0813 02/25/18 0356 02/26/18 0443  NA 136 135 137 139 139 141  K 4.6 4.3 4.0 3.8 4.8 3.8  CL 99* 103 101 104 107 106  CO2 26  --  23 29 26  32  GLUCOSE 417* 160* 407* 132* 44* 88  BUN 25* 28* 26* 14 11 13   CREATININE 1.51* 1.10 1.47* 1.00 0.83 0.93  CALCIUM 8.8*  --  8.5* 8.3* 7.9* 8.1*   GFR: Estimated Creatinine Clearance: 95 mL/min (by C-G formula based on SCr of 0.93 mg/dL). Liver Function Tests: Recent Labs  Lab 02/22/18 0207 02/26/18 0443  AST 24 73*  ALT 54 51*  ALKPHOS 73 53  BILITOT 1.8* 0.6  PROT 6.3* 4.9*  ALBUMIN 3.5 2.7*   Recent Labs  Lab 02/22/18 0207  LIPASE 20   No results for input(s): AMMONIA in the last 168 hours. Coagulation Profile: No results for input(s): INR, PROTIME in the last 168 hours. Cardiac Enzymes: No results for input(s): CKTOTAL, CKMB, CKMBINDEX, TROPONINI in the last 168 hours. BNP (last 3 results) No results for input(s): PROBNP in the last 8760 hours. HbA1C: No results for input(s): HGBA1C in the last 72 hours. CBG: Recent Labs  Lab 02/25/18 2014 02/26/18 0010 02/26/18 0400 02/26/18 0734 02/26/18 1132  GLUCAP 120* 184* 72 296* 130*   Lipid Profile: No results for input(s): CHOL, HDL, LDLCALC, TRIG, CHOLHDL, LDLDIRECT in the last 72 hours. Thyroid Function Tests: No results for input(s): TSH, T4TOTAL, FREET4, T3FREE, THYROIDAB in the last 72 hours. Anemia Panel: No results for input(s): VITAMINB12, FOLATE, FERRITIN, TIBC, IRON, RETICCTPCT in the last 72 hours. Urine analysis:    Component Value Date/Time   COLORURINE STRAW (A) 02/22/2018 0743   APPEARANCEUR CLEAR 02/22/2018 0743   LABSPEC 1.026 02/22/2018 0743   PHURINE 5.0 02/22/2018 0743   GLUCOSEU >=500 (A) 02/22/2018 0743   HGBUR NEGATIVE 02/22/2018 0743   HGBUR negative 05/12/2007 1355   BILIRUBINUR NEGATIVE 02/22/2018 0743   BILIRUBINUR negative 05/23/2014 1052   KETONESUR 80 (A) 02/22/2018 0743   PROTEINUR NEGATIVE 02/22/2018 0743     UROBILINOGEN 0.2 10/04/2014 1749   NITRITE NEGATIVE 02/22/2018 0743   LEUKOCYTESUR NEGATIVE 02/22/2018 0743   Sepsis Labs: @LABRCNTIP (procalcitonin:4,lacticidven:4)  )No results found for this or any previous visit (from the past 240 hour(s)).    Studies: No results found.  Scheduled Meds: . enoxaparin (LOVENOX) injection  40 mg Subcutaneous Q24H  . insulin aspart  0-9 Units Subcutaneous Q4H  . insulin aspart protamine- aspart  15 Units Subcutaneous BID WC  . pantoprazole  40 mg Oral Daily  . sucralfate  1 g Oral TID WC & HS    Continuous Infusions:    LOS: 2 days     Darlin Droparole N Keaisha Sublette, MD [facility] Pager [phone]  If 7PM-7AM, please contact night-coverage [email or web address] Password [email or web address] 02/26/2018, 1:42 PM

## 2018-02-26 NOTE — Progress Notes (Signed)
Inpatient Diabetes Program Recommendations  AACE/ADA: New Consensus Statement on Inpatient Glycemic Control (2015)  Target Ranges:  Prepandial:   less than 140 mg/dL      Peak postprandial:   less than 180 mg/dL (1-2 hours)      Critically ill patients:  140 - 180 mg/dL   Lab Results  Component Value Date   GLUCAP 130 (H) 02/26/2018   HGBA1C 11.4 (H) 01/25/2018    Review of Glycemic Control  Diabetes history: DM1 Outpatient Diabetes medications: NPH 25 units bid, R 0-15 units tidwc and hs Current orders for Inpatient glycemic control: 70/30 15 units bid, Novolog 0-9 units Q4H  Inpatient Diabetes Program Recommendations:     Change Novolog to 0-9 units tidwc  Will continue to follow [facility].  Thank you. Ailene Ardshonda Helina Hullum, RD, LDN, CDE Inpatient Diabetes Coordinator [phone]

## 2018-02-27 LAB — GLUCOSE, CAPILLARY
GLUCOSE-CAPILLARY: 147 mg/dL — AB (ref 70–99)
GLUCOSE-CAPILLARY: 162 mg/dL — AB (ref 70–99)
GLUCOSE-CAPILLARY: 197 mg/dL — AB (ref 70–99)
Glucose-Capillary: 106 mg/dL — ABNORMAL HIGH (ref 70–99)
Glucose-Capillary: 114 mg/dL — ABNORMAL HIGH (ref 70–99)
Glucose-Capillary: 196 mg/dL — ABNORMAL HIGH (ref 70–99)
Glucose-Capillary: 232 mg/dL — ABNORMAL HIGH (ref 70–99)

## 2018-02-27 MED ORDER — INSULIN ASPART 100 UNIT/ML ~~LOC~~ SOLN
0.0000 [IU] | Freq: Three times a day (TID) | SUBCUTANEOUS | Status: DC
  Administered 2018-02-27 – 2018-02-28 (×4): 2 [IU] via SUBCUTANEOUS
  Administered 2018-02-28: 9 [IU] via SUBCUTANEOUS

## 2018-02-27 MED ORDER — INSULIN ASPART PROT & ASPART (70-30 MIX) 100 UNIT/ML ~~LOC~~ SUSP: 15.0000 [IU] | Freq: Two times a day (BID) | SUBCUTANEOUS | 0 refills | Status: DC

## 2018-02-27 MED ORDER — INSULIN ASPART 100 UNIT/ML ~~LOC~~ SOLN: 0.0000 [IU] | Freq: Three times a day (TID) | SUBCUTANEOUS | 0 refills | Status: DC

## 2018-02-27 NOTE — Discharge Summary (Addendum)
Discharge Summary  John LernerWilliam B Cox ZOX:[id number] DOB: [birth year]  PCP: Patient, No Pcp Per  Admit date: 02/24/2018 Discharge date: 02/27/2018  Time spent: 25 minutes  UPDATE: Discharge delayed. Patient would have to be evaluated by psych [facility]. Possible discharge tomorrow 02/28/18.  Recommendations for Outpatient Follow-up:  Take your medications as prescribed Follow-up with behavioral health Follow-up with diabetes coordinator at behavioral health  Discharge Diagnoses:  Active Hospital Problems   Diagnosis Date Noted  . Hyperglycemia due to type 1 diabetes mellitus (HCC) 02/23/2018  . Hyperglycemia 02/23/2018  . Hyperlipidemia associated with type 2 diabetes mellitus (HCC) 05/24/2014  . Suicidal ideation 05/13/2014  . TOBACCO ABUSE 04/26/2007    Resolved Hospital Problems  No resolved problems to display.    Discharge Condition: Stable  Diet recommendation: Resume previous diet  Vitals:   02/26/18 2007 02/27/18 0412  BP: 128/72 123/71  Pulse: 79 67  Resp: 18 16  Temp: 98.9 F (37.2 C) 98 F (36.7 C)  SpO2: 100% 97%    History of present illness:  Mr. John ConstantMoore is a 47 year old male with past medical history significant for type 1 diabetes, cocaine induced mood disorder, major depressive disorder who was initially admitted at the behavioral health center.  Due to uncontrolled hyperglycemia [facility] for further assessment and treatment of his uncontrolled type 1 diabetes.  Hospital course complicated by hypoglycemia and labile blood sugar.  Diabetes coordinator consulted.  Blood sugar improving.  02/27/2018: Patient seen and examined at bedside.  He has no new complaints.    On the day of discharge, he was hemodynamically stable.  His blood sugar was well controlled.   Hospital Course:  Active Problems:   TOBACCO ABUSE   Suicidal ideation   Hyperlipidemia associated with type 2 diabetes mellitus (HCC)   Hyperglycemia  due to type 1 diabetes mellitus (HCC)   Hyperglycemia   Uncontrolled type 1 diabetes Heart healthy diabetic diet Last A1c 11.4 on 01/25/2018 Continue 70 3015 units twice daily and NovoLog 0 to 90 units 3 times daily Continue to closely monitor blood sugar  Hypoglycemia with labile blood glucose Hypoglycemia has resolved  Intractable nausea/epigastric pain Lipase negative at 24 Continue PPI and sucralfate  Headaches Tylenol as needed for mild to moderate pain CT head without contrast done on 11/13/2017 was unremarkable for any acute intracranial abnormality.  GERD Continue PPI Continue Carafate  Major depression Continue treatment at the behavioral health  Polysubstance abuse Cessation counseling at bedside Continue treatment at the behavioral health   Procedures:  None  Consultations:  Diabetes coordinator  Discharge Exam: BP 123/71 (BP Location: Right Arm)   Pulse 67   Temp 98 F (36.7 C) (Oral)   Resp 16   Ht 5\' 10"  (1.778 m)   Wt 68.4 kg (150 lb 12.7 oz)   SpO2 97%   BMI 21.64 kg/m  . General: 47 y.o. year-old male well developed well nourished in no acute distress.  Alert and oriented x3. . Cardiovascular: Regular rate and rhythm with no rubs or gallops.  No thyromegaly or JVD noted.   Marland Kitchen. Respiratory: Clear to auscultation with no wheezes or rales. Good inspiratory effort. . Abdomen: Soft nontender nondistended with normal bowel sounds x4 quadrants. . Musculoskeletal: No lower extremity edema. 2/4 pulses in all 4 extremities. . Skin: No ulcerative lesions noted or rashes, . Psychiatry: Mood is appropriate for condition and setting  Discharge Instructions You were cared for by a hospitalist during your hospital  stay. If you have any questions about your discharge medications or the care you received while you were in the hospital after you are discharged, you can call the unit and asked to speak with the hospitalist on call if the hospitalist that took  care of you is not available. Once you are discharged, your primary care physician will handle any further medical issues. Please note that NO REFILLS for any discharge medications will be authorized once you are discharged, as it is imperative that you return to your primary care physician (or establish a relationship with a primary care physician if you do not have one) for your aftercare needs so that they can reassess your need for medications and monitor your lab values.   Allergies as of 02/27/2018   No Known Allergies     Medication List    STOP taking these medications   cyclobenzaprine 10 MG tablet Commonly known as:  FLEXERIL   feeding supplement (ENSURE ENLIVE) Liqd   insulin NPH-regular Human (70-30) 100 UNIT/ML injection Commonly known as:  NOVOLIN 70/30 Replaced by:  insulin aspart protamine- aspart (70-30) 100 UNIT/ML injection   insulin regular 100 units/mL injection Commonly known as:  NOVOLIN R,HUMULIN R   levofloxacin 750 MG tablet Commonly known as:  LEVAQUIN   meloxicam 15 MG tablet Commonly known as:  MOBIC   nicotine 21 mg/24hr patch Commonly known as:  NICODERM CQ - dosed in mg/24 hours   protein supplement shake Liqd Commonly known as:  PREMIER PROTEIN     TAKE these medications   insulin aspart 100 UNIT/ML injection Commonly known as:  novoLOG Inject 0-9 Units into the skin 3 (three) times daily with meals.   insulin aspart protamine- aspart (70-30) 100 UNIT/ML injection Commonly known as:  NOVOLOG MIX 70/30 Inject 0.15 mLs (15 Units total) into the skin 2 (two) times daily with a meal. Replaces:  insulin NPH-regular Human (70-30) 100 UNIT/ML injection   Insulin Syringe-Needle U-100 29G X 1/2" 0.3 ML Misc Commonly known as:  SAFETY-GLIDE 0.3CC SYR 29GX1/2 Use as directed.   omeprazole 20 MG capsule Commonly known as:  PRILOSEC Take 1 capsule (20 mg total) by mouth daily.   sucralfate 1 g tablet Commonly known as:  CARAFATE Take 1 tablet  (1 g total) by mouth 4 (four) times daily -  with meals and at bedtime.      No Known Allergies    The results of significant diagnostics from this hospitalization (including imaging, microbiology, ancillary and laboratory) are listed below for reference.    Significant Diagnostic Studies: Dg Abd Acute W/chest  Result Date: 02/22/2018 CLINICAL DATA:  Nausea, vomiting and abdominal pain x2 days. EXAM: DG ABDOMEN ACUTE W/ 1V CHEST COMPARISON:  CXR 11/13/2017, CT AP 05/06/2017 FINDINGS: There is no evidence of dilated bowel loops or free intraperitoneal air. No radiopaque calculi or other significant radiographic abnormality is seen. Pelvic phleboliths are present bilaterally. Heart size and mediastinal contours are within normal limits. Both lungs are clear. IMPRESSION: Negative abdominal radiographs.  No acute cardiopulmonary disease. Electronically Signed   By: Tollie Eth M.D.   On: 02/22/2018 02:58    Microbiology: No results found for this or any previous visit (from the past 240 hour(s)).   Labs: Basic Metabolic Panel: Recent Labs  Lab 02/22/18 1949 02/22/18 2338 02/23/18 1834 02/24/18 0813 02/25/18 0356 02/26/18 0443  NA 136 135 137 139 139 141  K 4.6 4.3 4.0 3.8 4.8 3.8  CL 99* 103 101 104 107  106  CO2 26  --  23 29 26  32  GLUCOSE 417* 160* 407* 132* 44* 88  BUN 25* 28* 26* 14 11 13   CREATININE 1.51* 1.10 1.47* 1.00 0.83 0.93  CALCIUM 8.8*  --  8.5* 8.3* 7.9* 8.1*   Liver Function Tests: Recent Labs  Lab 02/22/18 0207 02/26/18 0443  AST 24 73*  ALT 54 51*  ALKPHOS 73 53  BILITOT 1.8* 0.6  PROT 6.3* 4.9*  ALBUMIN 3.5 2.7*   Recent Labs  Lab 02/22/18 0207  LIPASE 20   No results for input(s): AMMONIA in the last 168 hours. CBC: Recent Labs  Lab 02/22/18 0207 02/22/18 1949 02/22/18 2338 02/23/18 1834 02/24/18 1038 02/26/18 0443  WBC 4.6 5.2  --  5.2 3.9* 3.2*  NEUTROABS  --   --   --  3.8  --   --   HGB 12.3* 11.6* 11.6* 11.1* 10.3* 10.2*  HCT  37.0* 35.6* 34.0* 34.1* 31.3* 31.5*  MCV 85.6 87.3  --  85.9 85.5 86.3  PLT 344 327  --  307 305 273   Cardiac Enzymes: No results for input(s): CKTOTAL, CKMB, CKMBINDEX, TROPONINI in the last 168 hours. BNP: BNP (last 3 results) No results for input(s): BNP in the last 8760 hours.  ProBNP (last 3 results) No results for input(s): PROBNP in the last 8760 hours.  CBG: Recent Labs  Lab 02/26/18 1548 02/26/18 2005 02/26/18 2358 02/27/18 0410 02/27/18 0735  GLUCAP 193* 170* 147* 232* 196*       Signed:  Darlin Drop, MD [facility] 02/27/2018, 11:18 AM

## 2018-02-27 NOTE — Progress Notes (Signed)
CSW informed by patient's RN that patient has been discharged and [facility].  [facility] to inquire about patient's ability to return. AC reported that patient will need a new psychiatric evaluation before they can accept patient back.   CSW updated patient's attending MD that patient needs a psychiatric consult/evaluation before [facility].   Barriers to discharge Psychiatric consult and evaluation  CSW will continue to follow and assist with discharge planning.  Celso SickleKimberly Japleen Tornow, [place] Clinical Social Worker [facility] Cell#: [phone]

## 2018-02-27 NOTE — Progress Notes (Signed)
Dr. Margo AyeHall notified that psychiatrist consulted to evaluate pt [facility] today unavailable til the am. [facility] 1200 and communicated early to primary nurse would not be returning this afternoon. No other physicians available to evaluate. See new orders in EPIC to cancel dc til tomorrow. Pt aware and reassured.

## 2018-02-27 NOTE — Evaluation (Signed)
Physical Therapy Evaluation Patient Details Name: Rhae LernerWilliam B Fauble MRN: [id number] DOB: 08-08-1971 Today's Date: 02/27/2018   History of Present Illness  [name] is a 47 year old male with past medical history significant for type 1 diabetes, cocaine induced mood disorder, major depressive disorder who was initially admitted at the behavioral health center.  Due to uncontrolled hyperglycemia [facility] assessment and treatment of his uncontrolled hyperglycemia.  Clinical Impression   [name] is a very pleasant 47 y/o admitted with the above listed diagnosis. Prior to admission patient reports independence with all aspects of mobility. Patient today performing transfers at SUP-Mod I level for general safety. No LOB or instability noted with mobility. No further acute PT needs identified. PT to sign off.     Follow Up Recommendations No PT follow up    Equipment Recommendations  None recommended by PT    Recommendations for Other Services       Precautions / Restrictions Precautions Precautions: Fall Restrictions Weight Bearing Restrictions: No      Mobility  Bed Mobility Overal bed mobility: Independent                Transfers Overall transfer level: Modified independent Equipment used: None                Ambulation/Gait Ambulation/Gait assistance: Supervision Gait Distance (Feet): 500 Feet Assistive device: None Gait Pattern/deviations: Step-through pattern;Narrow base of support Gait velocity: normal for age and gender   General Gait Details: no instability or LOB noted  Stairs            Wheelchair Mobility    Modified Rankin (Stroke Patients Only)       Balance Overall balance assessment: Modified Independent                                           Pertinent Vitals/Pain Pain Assessment: No/denies pain    Home Living Family/patient expects to be discharged to:: [facility]                       Prior Function Level of Independence: Independent               Hand Dominance        Extremity/Trunk Assessment   Upper Extremity Assessment Upper Extremity Assessment: Overall WFL for tasks assessed    Lower Extremity Assessment Lower Extremity Assessment: Overall WFL for tasks assessed    Cervical / Trunk Assessment Cervical / Trunk Assessment: Normal  Communication   Communication: No difficulties  Cognition Arousal/Alertness: Awake/alert Behavior During Therapy: WFL for tasks assessed/performed Overall Cognitive Status: Within Functional Limits for tasks assessed                                        General Comments      Exercises     Assessment/Plan    PT Assessment Patent does not need any further PT services  PT Problem List         PT Treatment Interventions      PT Goals (Current goals can be found in the Care Plan section)  Acute Rehab PT Goals Patient Stated Goal: none stated PT Goal Formulation: With patient    Frequency     Barriers to discharge  Co-evaluation               AM-PAC PT "6 Clicks" Daily Activity  Outcome Measure Difficulty turning over in bed (including adjusting bedclothes, sheets and blankets)?: None Difficulty moving from lying on back to sitting on the side of the bed? : None Difficulty sitting down on and standing up from a chair with arms (e.g., wheelchair, bedside commode, etc,.)?: None Help needed moving to and from a bed to chair (including a wheelchair)?: A Little Help needed walking in hospital room?: A Little Help needed climbing 3-5 steps with a railing? : A Little 6 Click Score: 21    End of Session Equipment Utilized During Treatment: Gait belt Activity Tolerance: Patient tolerated treatment well Patient left: in chair;with call bell/phone within reach Nurse Communication: Mobility status PT Visit Diagnosis: Other abnormalities of gait and mobility  (R26.89)    Time: 1610-9604 PT Time Calculation (min) (ACUTE ONLY): 11 min   Charges:   PT Evaluation $PT Eval Low Complexity: 1 Low     PT G Codes:        Kipp Laurence, PT, DPT 02/27/18 12:24 PM

## 2018-02-28 DIAGNOSIS — R739 Hyperglycemia, unspecified: Secondary | ICD-10-CM

## 2018-02-28 DIAGNOSIS — F17200 Nicotine dependence, unspecified, uncomplicated: Secondary | ICD-10-CM

## 2018-02-28 DIAGNOSIS — R45851 Suicidal ideations: Secondary | ICD-10-CM

## 2018-02-28 DIAGNOSIS — E785 Hyperlipidemia, unspecified: Secondary | ICD-10-CM

## 2018-02-28 DIAGNOSIS — F1424 Cocaine dependence with cocaine-induced mood disorder: Principal | ICD-10-CM

## 2018-02-28 DIAGNOSIS — E1169 Type 2 diabetes mellitus with other specified complication: Secondary | ICD-10-CM

## 2018-02-28 DIAGNOSIS — E1065 Type 1 diabetes mellitus with hyperglycemia: Secondary | ICD-10-CM

## 2018-02-28 LAB — GLUCOSE, CAPILLARY
Glucose-Capillary: 308 mg/dL — ABNORMAL HIGH (ref 70–99)
Glucose-Capillary: 394 mg/dL — ABNORMAL HIGH (ref 70–99)
Glucose-Capillary: 172 mg/dL — ABNORMAL HIGH (ref 70–99)

## 2018-02-28 LAB — BASIC METABOLIC PANEL
Anion gap: 7 (ref 5–15)
BUN: 17 mg/dL (ref 6–20)
CALCIUM: 8.7 mg/dL — AB (ref 8.9–10.3)
CO2: 30 mmol/L (ref 22–32)
Chloride: 101 mmol/L (ref 98–111)
Creatinine, Ser: 0.95 mg/dL (ref 0.61–1.24)
GFR calc non Af Amer: >60 mL/min (ref >60)
Glucose, Bld: 311 mg/dL — ABNORMAL HIGH (ref 70–99)
Potassium: 4.6 mmol/L (ref 3.5–5.1)
SODIUM: 138 mmol/L (ref 135–145)

## 2018-02-28 MED ORDER — INSULIN ASPART 100 UNIT/ML ~~LOC~~ SOLN: 0.0000 [IU] | Freq: Every day | SUBCUTANEOUS | 0 refills | Status: DC

## 2018-02-28 MED ORDER — FLUOXETINE HCL 10 MG PO CAPS: 10.0000 mg | ORAL_CAPSULE | Freq: Every day | ORAL | 0 refills | Status: DC

## 2018-02-28 MED ORDER — FLUOXETINE HCL 10 MG PO CAPS
10.0000 mg | ORAL_CAPSULE | Freq: Every day | ORAL | Status: DC
  Administered 2018-02-28: 10 mg via ORAL
  Filled 2018-02-28: qty 1

## 2018-02-28 MED ORDER — INSULIN ASPART PROT & ASPART (70-30 MIX) 100 UNIT/ML ~~LOC~~ SUSP: 15.0000 [IU] | Freq: Two times a day (BID) | SUBCUTANEOUS | 0 refills | Status: DC

## 2018-02-28 MED ORDER — INSULIN ASPART 100 UNIT/ML ~~LOC~~ SOLN: 0.0000 [IU] | Freq: Every day | SUBCUTANEOUS | Status: DC

## 2018-02-28 MED ORDER — INSULIN ASPART 100 UNIT/ML ~~LOC~~ SOLN: 0.0000 [IU] | Freq: Three times a day (TID) | SUBCUTANEOUS | 0 refills | Status: DC

## 2018-02-28 NOTE — Progress Notes (Signed)
Discharge Summary  John Cox:[id number] DOB: 05-02-1971  PCP: Patient, No Pcp Per  Admit date: 02/24/2018 Discharge date: 02/28/2018  Time spent: 25 minutes  UPDATE: Discharge delayed. Patient would have to be evaluated by psych prior to discharge to [facility]. Possible discharge tomorrow 02/28/18.  02/28/2018: Patient seen and examined at his bedside no new complaints.  Awaiting psych evaluation prior to transfer to [facility].  Recommendations for Outpatient Follow-up:  Take your medications as prescribed Follow-up with behavioral health Follow-up with diabetes coordinator at behavioral health  Discharge Diagnoses:  Active Hospital Problems   Diagnosis Date Noted  . Hyperglycemia due to type 1 diabetes mellitus (HCC) 02/23/2018  . Hyperglycemia 02/23/2018  . Hyperlipidemia associated with type 2 diabetes mellitus (HCC) 05/24/2014  . Suicidal ideation 05/13/2014  . TOBACCO ABUSE 04/26/2007    Resolved Hospital Problems  No resolved problems to display.    Discharge Condition: Stable  Diet recommendation: Resume previous diet  Vitals:   02/27/18 2016 02/28/18 0403  BP: 110/65 101/73  Pulse: 76 69  Resp: 18 15  Temp: 98.7 F (37.1 C) 98 F (36.7 C)  SpO2: 100% 100%    History of present illness:  [name] is a 47 year old male with past medical history significant for type 1 diabetes, cocaine induced mood disorder, major depressive disorder who was initially admitted at the behavioral health center.  Due to uncontrolled hyperglycemia was transferred to [facility] for further assessment and treatment of his uncontrolled type 1 diabetes.  Hospital course complicated by hypoglycemia and labile blood sugar.  Diabetes coordinator consulted.  Blood sugar improving.  02/27/2018: Patient seen and examined at bedside.  He has no new complaints.    On the day of discharge, he was hemodynamically stable.  His blood sugar was well controlled.   Hospital Course:  Active  Problems:   TOBACCO ABUSE   Suicidal ideation   Hyperlipidemia associated with type 2 diabetes mellitus (HCC)   Hyperglycemia due to type 1 diabetes mellitus (HCC)   Hyperglycemia   Uncontrolled type 1 diabetes Heart healthy diabetic diet Last A1c 11.4 on 01/25/2018 Continue 70/30 15 units twice daily and NovoLog 0 to 9 units 3 times daily Continue to closely monitor blood sugar  Hypoglycemia with labile blood glucose Hypoglycemia has resolved  Intractable nausea/epigastric pain Lipase negative at 24 Continue PPI and sucralfate  Headaches Tylenol as needed for mild to moderate pain CT head without contrast done on 11/13/2017 was unremarkable for any acute intracranial abnormality.  GERD Continue PPI Continue Carafate  Major depression Continue treatment at the behavioral health  Polysubstance abuse Cessation counseling at bedside Continue treatment at the behavioral health   Procedures:  None  Consultations:  Diabetes coordinator  Discharge Exam: BP 101/73 (BP Location: Left Arm)   Pulse 69   Temp 98 F (36.7 C) (Oral)   Resp 15   Ht 5\' 10"  (1.778 m)   Wt 68.4 kg (150 lb 12.7 oz)   SpO2 100%   BMI 21.64 kg/m  . General: 47 y.o. year-old male well-developed well-nourished in no acute distress.  Alert and oriented x3.   . Cardiovascular: Regular rate and rhythm with no rubs or gallops.  No thyromegaly or JVD noted. Marland Kitchen Respiratory: Clear to auscultation with no wheezes or rales. Good inspiratory effort. . Abdomen: Soft nontender nondistended with normal bowel sounds x4 quadrants. . Musculoskeletal: No lower extremity edema. 2/4 pulses in all 4 extremities. . Skin: No ulcerative lesions noted or rashes, . Psychiatry: Mood  is appropriate for condition and setting  Discharge Instructions You were cared for by a hospitalist during your hospital stay. If you have any questions about your discharge medications or the care you received while you were in the  hospital after you are discharged, you can call the unit and asked to speak with the hospitalist on call if the hospitalist that took care of you is not available. Once you are discharged, your primary care physician will handle any further medical issues. Please note that NO REFILLS for any discharge medications will be authorized once you are discharged, as it is imperative that you return to your primary care physician (or establish a relationship with a primary care physician if you do not have one) for your aftercare needs so that they can reassess your need for medications and monitor your lab values.   Allergies as of 02/28/2018   No Known Allergies     Medication List    STOP taking these medications   cyclobenzaprine 10 MG tablet Commonly known as:  FLEXERIL   feeding supplement (ENSURE ENLIVE) Liqd   insulin NPH-regular Human (70-30) 100 UNIT/ML injection Commonly known as:  NOVOLIN 70/30   insulin regular 100 units/mL injection Commonly known as:  NOVOLIN R,HUMULIN R   levofloxacin 750 MG tablet Commonly known as:  LEVAQUIN   meloxicam 15 MG tablet Commonly known as:  MOBIC   nicotine 21 mg/24hr patch Commonly known as:  NICODERM CQ - dosed in mg/24 hours   protein supplement shake Liqd Commonly known as:  PREMIER PROTEIN     TAKE these medications   insulin aspart 100 UNIT/ML injection Commonly known as:  novoLOG Inject 0-5 Units into the skin at bedtime.   insulin aspart 100 UNIT/ML injection Commonly known as:  novoLOG Inject 0-9 Units into the skin 3 (three) times daily with meals.   insulin aspart protamine- aspart (70-30) 100 UNIT/ML injection Commonly known as:  NOVOLOG MIX 70/30 Inject 0.15 mLs (15 Units total) into the skin 2 (two) times daily with a meal.   Insulin Syringe-Needle U-100 29G X 1/2" 0.3 ML Misc Commonly known as:  SAFETY-GLIDE 0.3CC SYR 29GX1/2 Use as directed.   omeprazole 20 MG capsule Commonly known as:  PRILOSEC Take 1 capsule  (20 mg total) by mouth daily.   sucralfate 1 g tablet Commonly known as:  CARAFATE Take 1 tablet (1 g total) by mouth 4 (four) times daily -  with meals and at bedtime.      No Known Allergies    The results of significant diagnostics from this hospitalization (including imaging, microbiology, ancillary and laboratory) are listed below for reference.    Significant Diagnostic Studies: Dg Abd Acute W/chest  Result Date: 02/22/2018 CLINICAL DATA:  Nausea, vomiting and abdominal pain x2 days. EXAM: DG ABDOMEN ACUTE W/ 1V CHEST COMPARISON:  CXR 11/13/2017, CT AP 05/06/2017 FINDINGS: There is no evidence of dilated bowel loops or free intraperitoneal air. No radiopaque calculi or other significant radiographic abnormality is seen. Pelvic phleboliths are present bilaterally. Heart size and mediastinal contours are within normal limits. Both lungs are clear. IMPRESSION: Negative abdominal radiographs.  No acute cardiopulmonary disease. Electronically Signed   By: Tollie Ethavid  Kwon M.D.   On: 02/22/2018 02:58    Microbiology: No results found for this or any previous visit (from the past 240 hour(s)).   Labs: Basic Metabolic Panel: Recent Labs  Lab 02/23/18 1834 02/24/18 0813 02/25/18 0356 02/26/18 0443 02/28/18 0849  NA 137 139 139 141  138  K 4.0 3.8 4.8 3.8 4.6  CL 101 104 107 106 101  CO2 23 29 26  32 30  GLUCOSE 407* 132* 44* 88 311*  BUN 26* 14 11 13 17   CREATININE 1.47* 1.00 0.83 0.93 0.95  CALCIUM 8.5* 8.3* 7.9* 8.1* 8.7*   Liver Function Tests: Recent Labs  Lab 02/22/18 0207 02/26/18 0443  AST 24 73*  ALT 54 51*  ALKPHOS 73 53  BILITOT 1.8* 0.6  PROT 6.3* 4.9*  ALBUMIN 3.5 2.7*   Recent Labs  Lab 02/22/18 0207  LIPASE 20   No results for input(s): AMMONIA in the last 168 hours. CBC: Recent Labs  Lab 02/22/18 0207 02/22/18 1949 02/22/18 2338 02/23/18 1834 02/24/18 1038 02/26/18 0443  WBC 4.6 5.2  --  5.2 3.9* 3.2*  NEUTROABS  --   --   --  3.8  --   --     HGB 12.3* 11.6* 11.6* 11.1* 10.3* 10.2*  HCT 37.0* 35.6* 34.0* 34.1* 31.3* 31.5*  MCV 85.6 87.3  --  85.9 85.5 86.3  PLT 344 327  --  307 305 273   Cardiac Enzymes: No results for input(s): CKTOTAL, CKMB, CKMBINDEX, TROPONINI in the last 168 hours. BNP: BNP (last 3 results) No results for input(s): BNP in the last 8760 hours.  ProBNP (last 3 results) No results for input(s): PROBNP in the last 8760 hours.  CBG: Recent Labs  Lab 02/27/18 1602 02/27/18 1959 02/27/18 2334 02/28/18 0401 02/28/18 0751  GLUCAP 197* 114* 106* 308* 394*       Signed:  Darlin Drop, MD [facility] 02/28/2018, 11:06 AM

## 2018-02-28 NOTE — Progress Notes (Signed)
CSW provided pt with information regarding [facility] and placed instructions on pt AVS to follow up  CSW signing off  Burna SisJenna H. Garald Rhew, LCSW Clinical Social Worker [phone]

## 2018-02-28 NOTE — Progress Notes (Signed)
Pt rested quietly at long intervals with eyes closed, resp even and unlabored. No further requests voiced. Uneventful night.

## 2018-02-28 NOTE — Progress Notes (Signed)
Assessment unchanged. Pt verbalized understanding of dc instructions through teach back including follow up care and medications to resume. Scripts given as provided by MD. Discharged via foot per request to front entrance accompanied by NT.

## 2018-02-28 NOTE — Consult Note (Signed)
[place] Psychiatry Consult   Reason for Consult: ''uncontrolled diabetes, now medically cleared for transfer to [facility].'' Referring Physician:  Dr. Nevada Crane Patient Identification: John Cox MRN:  [id number] Principal Diagnosis: Cocaine dependence with cocaine-induced mood disorder [facility]) Diagnosis:   Patient Active Problem List   Diagnosis Date Noted  . Hyperglycemia due to type 1 diabetes mellitus ([place]) [E10.65] 02/23/2018  . Hyperglycemia [R73.9] 02/23/2018  . Cocaine dependence with cocaine-induced mood disorder ([place]) [F14.24]   . MDD (major depressive disorder), recurrent severe, without psychosis ([place]) [F33.2] 02/22/2018  . Tachycardia [R00.0] 01/25/2018  . CAP (community acquired pneumonia) [J18.9] 01/25/2018  . Hypoglycemia due to insulin [E16.0, T38.3X5A] 11/13/2017  . Hypoglycemia [E16.2] 11/13/2017  . Malnutrition of moderate degree [E44.0] 07/19/2015  . DKA (diabetic ketoacidoses) ([place]) [E13.10] 07/18/2015  . DKA, type 2, not at goal [facility]) [E11.10] 07/18/2015  . Hyperkalemia [E87.5] 07/18/2015  . Acute kidney injury ([place]) [N17.9] 07/18/2015  . Nausea vomiting and diarrhea [R11.2, R19.7] 07/18/2015  . Marijuana abuse [F12.10] 07/18/2015  . Dehydration [E86.0] 07/18/2015  . Dental caries [K02.9] 07/18/2015  . Hyperlipidemia associated with type 2 diabetes mellitus ([place]) [E11.69, E78.5] 05/24/2014  . Polysubstance abuse ([place]) [F19.10] 05/13/2014  . Major depression, recurrent ([place]) [F33.9] 05/13/2014  . Suicidal ideation [R45.851] 05/13/2014  . ANTICARDIOLIPIN ANTIBODY SYNDROME [R89.4] 05/12/2007  . Type 1 diabetes mellitus ([place]) [E10.9] 04/26/2007  . TOBACCO ABUSE [F17.200] 04/26/2007  . THROMBOSIS, PORTAL VEIN [I81] 11/16/2006    Total Time spent with patient: 45 minutes  Subjective:   John Cox is a 47 y.o. male patient admitted to the medical floor due to uncontrolled diabetes.  HPI:  Patient who reports history of depression, substance abuse and   type 1 diabetes. He states that he was admitted to [facility] few days ago for Cocaine induced mood disorder but was transferred to [facility] due to uncontrolled hyperglycemia. Patient reports occasional depressive mood due to his inability to control his blood sugar. Today, he is calm, cooperative, denies depressive symptoms, psychosis, delusions, SI/HI and requesting to be discharged. He wants a referral for outpatient treatment upon discharge.   Past Psychiatric History: as above  Risk to Self:  denies Risk to Others:  denies Prior Inpatient Therapy:   [facility] Prior Outpatient Therapy:    None Past Medical History:  Past Medical History:  Diagnosis Date  . AKI (acute kidney injury) ([place]) 01/25/2018  . Anxiety   . Bipolar disorder ([place])   . Depression 2000  . Diabetes mellitus 1995  . Hyperglycemia 01/25/2018  . Nausea & vomiting   . Polysubstance abuse ([place]) 2012   relapse 2 wk ago   . Suicidal ideation     Past Surgical History:  Procedure Laterality Date  . NO PAST SURGERIES     Family History:  Family History  Problem Relation Age of Onset  . Diabetes Mother   . COPD Mother   . Heart disease Mother    Family Psychiatric  History:  Social History:  Social History   Substance and Sexual Activity  Alcohol Use Yes  . Alcohol/week: 0.6 oz  . Types: 1 Cans of beer per week   Comment: one beer weekly     Social History   Substance and Sexual Activity  Drug Use Yes  . Types: Marijuana, Cocaine   Comment: THC daily, Cocaine daily    Social History   Socioeconomic History  . Marital status: Single    Spouse name: Not on file  .  Number of children: Not on file  . Years of education: Not on file  . Highest education level: Not on file  Occupational History  . Not on file  Social Needs  . Financial resource strain: Not on file  . Food insecurity:    Worry: Not on file    Inability: Not on file  . Transportation needs:    Medical: Not on file    Non-medical:  Not on file  Tobacco Use  . Smoking status: Current Every Day Smoker    Packs/day: 1.00    Years: 25.00    Pack years: 25.00    Types: Cigarettes  . Smokeless tobacco: Never Used  Substance and Sexual Activity  . Alcohol use: Yes    Alcohol/week: 0.6 oz    Types: 1 Cans of beer per week    Comment: one beer weekly  . Drug use: Yes    Types: Marijuana, Cocaine    Comment: THC daily, Cocaine daily  . Sexual activity: Not Currently  Lifestyle  . Physical activity:    Days per week: Not on file    Minutes per session: Not on file  . Stress: Not on file  Relationships  . Social connections:    Talks on phone: Not on file    Gets together: Not on file    Attends religious service: Not on file    Active member of club or organization: Not on file    Attends meetings of clubs or organizations: Not on file    Relationship status: Not on file  Other Topics Concern  . Not on file  Social History Narrative  . Not on file   Additional Social History:    Allergies:  No Known Allergies  Labs:  Results for orders placed or performed during the hospital encounter of 02/24/18 (from the past 48 hour(s))  Glucose, capillary     Status: Abnormal   Collection Time: 02/26/18 11:32 AM  Result Value Ref Range   Glucose-Capillary 130 (H) 70 - 99 mg/dL  Glucose, capillary     Status: Abnormal   Collection Time: 02/26/18  3:48 PM  Result Value Ref Range   Glucose-Capillary 193 (H) 70 - 99 mg/dL  Glucose, capillary     Status: Abnormal   Collection Time: 02/26/18  8:05 PM  Result Value Ref Range   Glucose-Capillary 170 (H) 70 - 99 mg/dL  Glucose, capillary     Status: Abnormal   Collection Time: 02/26/18 11:58 PM  Result Value Ref Range   Glucose-Capillary 147 (H) 70 - 99 mg/dL  Glucose, capillary     Status: Abnormal   Collection Time: 02/27/18  4:10 AM  Result Value Ref Range   Glucose-Capillary 232 (H) 70 - 99 mg/dL  Glucose, capillary     Status: Abnormal   Collection Time:  02/27/18  7:35 AM  Result Value Ref Range   Glucose-Capillary 196 (H) 70 - 99 mg/dL  Glucose, capillary     Status: Abnormal   Collection Time: 02/27/18 11:52 AM  Result Value Ref Range   Glucose-Capillary 162 (H) 70 - 99 mg/dL  Glucose, capillary     Status: Abnormal   Collection Time: 02/27/18  4:02 PM  Result Value Ref Range   Glucose-Capillary 197 (H) 70 - 99 mg/dL  Glucose, capillary     Status: Abnormal   Collection Time: 02/27/18  7:59 PM  Result Value Ref Range   Glucose-Capillary 114 (H) 70 - 99 mg/dL  Glucose, capillary  Status: Abnormal   Collection Time: 02/27/18 11:34 PM  Result Value Ref Range   Glucose-Capillary 106 (H) 70 - 99 mg/dL  Glucose, capillary     Status: Abnormal   Collection Time: 02/28/18  4:01 AM  Result Value Ref Range   Glucose-Capillary 308 (H) 70 - 99 mg/dL  Glucose, capillary     Status: Abnormal   Collection Time: 02/28/18  7:51 AM  Result Value Ref Range   Glucose-Capillary 394 (H) 70 - 99 mg/dL  Basic metabolic panel     Status: Abnormal   Collection Time: 02/28/18  8:49 AM  Result Value Ref Range   Sodium 138 135 - 145 mmol/L   Potassium 4.6 3.5 - 5.1 mmol/L   Chloride 101 98 - 111 mmol/L    Comment: Please note change in reference range.   CO2 30 22 - 32 mmol/L   Glucose, Bld 311 (H) 70 - 99 mg/dL    Comment: Please note change in reference range.   BUN 17 6 - 20 mg/dL    Comment: Please note change in reference range.   Creatinine, Ser 0.95 0.61 - 1.24 mg/dL   Calcium 8.7 (L) 8.9 - 10.3 mg/dL   GFR calc non Af Amer >60 >60 mL/min   GFR calc Af Amer >60 >60 mL/min    Comment: (NOTE) The eGFR has been calculated using the CKD EPI equation. This calculation has not been validated in all clinical situations. eGFR's persistently <60 mL/min signify possible Chronic Kidney Disease.    Anion gap 7 5 - 15    Comment: Performed at [facility], [place] [address], [place], [place] [address]    Current  Facility-Administered Medications  Medication Dose Route Frequency Provider Last Rate Last Dose  . acetaminophen (TYLENOL) tablet 650 mg  650 mg Oral Q6H PRN Kayleen Memos, DO   650 mg at 02/27/18 0824  . enoxaparin (LOVENOX) injection 40 mg  40 mg Subcutaneous Q24H Gala Romney L, MD   40 mg at 02/27/18 1228  . FLUoxetine (PROZAC) capsule 10 mg  10 mg Oral Daily Sharnika Binney, MD      . HYDROcodone-acetaminophen (NORCO/VICODIN) 5-325 MG per tablet 1-2 tablet  1-2 tablet Oral Q6H PRN Polly Cobia, RPH   2 tablet at 02/28/18 0805  . insulin aspart (novoLOG) injection 0-5 Units  0-5 Units Subcutaneous QHS Hall, Carole N, DO      . insulin aspart (novoLOG) injection 0-9 Units  0-9 Units Subcutaneous TID WC Irene Pap N, DO   9 Units at 02/28/18 0801  . insulin aspart protamine- aspart (NOVOLOG MIX 70/30) injection 15 Units  15 Units Subcutaneous BID WC Irene Pap N, DO   15 Units at 02/28/18 0801  . ondansetron (ZOFRAN) injection 4 mg  4 mg Intravenous Q6H PRN Schorr, Rhetta Mura, NP      . pantoprazole (PROTONIX) EC tablet 40 mg  40 mg Oral Daily Irene Pap N, DO   40 mg at 02/28/18 0805  . sucralfate (CARAFATE) tablet 1 g  1 g Oral TID WC & HS Hall, Carole N, DO   1 g at 02/28/18 0801    Musculoskeletal: Strength & Muscle Tone: within normal limits Gait & Station: normal Patient leans: N/A  Psychiatric Specialty Exam: Physical Exam  Psychiatric: He has a normal mood and affect. His speech is normal and behavior is normal. Judgment and thought content normal. Cognition and memory are normal.    Review of Systems  Constitutional:  Negative.   HENT: Negative.   Eyes: Negative.   Respiratory: Negative.   Cardiovascular: Negative.   Gastrointestinal: Negative.   Genitourinary: Negative.   Musculoskeletal: Negative.   Skin: Negative.   Neurological: Negative.   Endo/Heme/Allergies: Negative.   Psychiatric/Behavioral: Positive for substance abuse.    Blood pressure 101/73,  pulse 69, temperature 98 F (36.7 C), temperature source Oral, resp. rate 15, height _0  (1.778 m), weight 68.4 kg (150 lb 12.7 oz), SpO2 100 %.Body mass index is 21.64 kg/m.  General Appearance: Casual  Eye Contact:  Good  Speech:  Clear and Coherent  Volume:  Normal  Mood:  Euthymic  Affect:  Appropriate  Thought Process:  Coherent and Linear  Orientation:  Full (Time, Place, and Person)  Thought Content:  Logical  Suicidal Thoughts:  No  Homicidal Thoughts:  No  Memory:  Immediate;   Good Recent;   Good Remote;   Good  Judgement:  Intact  Insight:  Good  Psychomotor Activity:  Normal  Concentration:  Concentration: Good and Attention Span: Good  Recall:  Good  Fund of Knowledge:  Good  Language:  Good  Akathisia:  No  Handed:  Right  AIMS (if indicated):     Assets:  Communication Skills Desire for Improvement  ADL's:  Intact  Cognition:  WNL  Sleep:   good     Treatment Plan Summary: Patient with history of Depression, substance abuse who was admitted to [facility] for treatment of uncontrolled blood sugar. Patient seems to be stable and requesting for discharge.  Plan/Recommendations: -Prozac 10 mg po daily for mood. -Needs referral to [facility] for medication management and counseling. -Patient is cleared by psychiatric unit.  Disposition: No evidence of imminent risk to self or others at present.   Patient does not meet criteria for psychiatric inpatient admission. Supportive therapy provided about ongoing stressors. Unit Social worker to assist with psychiatric outpatient referrals.  Corena Pilgrim, MD 02/28/2018 11:31 AM

## 2018-02-28 NOTE — Discharge Summary (Signed)
Discharge Summary  John Cox:[id number] DOB: May 31, 1971  PCP: Patient, No Pcp Per  Admit date: 02/24/2018 Discharge date: 02/28/2018  Time spent: 25 minutes  UPDATE: Discharge delayed. Patient would have to be evaluated by psych prior to discharge to [facility]. Possible discharge tomorrow 02/28/18.  02/28/2018: Patient seen and examined at his bedside no new complaints.  Awaiting psych evaluation prior to transfer to [facility].  Patient is being transferred to [facility] where he will continue his treatment for depression/mood.  Diabetes coordinator will follow there.  On the day of discharge the patient was hemodynamically stable.  Blood sugar 172 with no anion gap or metabolic acidosis.  Recommendations for Outpatient Follow-up:  Take your medications as prescribed Follow-up with behavioral health Follow-up with diabetes coordinator at behavioral health  Discharge Diagnoses:  Active Hospital Problems   Diagnosis Date Noted  . Cocaine dependence with cocaine-induced mood disorder (HCC)   . Hyperglycemia due to type 1 diabetes mellitus (HCC) 02/23/2018  . Hyperglycemia 02/23/2018  . Hyperlipidemia associated with type 2 diabetes mellitus (HCC) 05/24/2014  . Suicidal ideation 05/13/2014  . TOBACCO ABUSE 04/26/2007    Resolved Hospital Problems  No resolved problems to display.    Discharge Condition: Stable  Diet recommendation: Resume previous diet  Vitals:   02/27/18 2016 02/28/18 0403  BP: 110/65 101/73  Pulse: 76 69  Resp: 18 15  Temp: 98.7 F (37.1 C) 98 F (36.7 C)  SpO2: 100% 100%    History of present illness:  John Cox is a 47 year old male with past medical history significant for type 1 diabetes, cocaine induced mood disorder, major depressive disorder who was initially admitted at the behavioral health center.  Due to uncontrolled hyperglycemia was transferred to [facility] for further assessment and treatment of his uncontrolled type 1 diabetes.  Hospital  course complicated by hypoglycemia and labile blood sugar.  Diabetes coordinator consulted.  Blood sugar improving.  02/27/2018: Patient seen and examined at bedside.  He has no new complaints.    On the day of discharge, he was hemodynamically stable.  His blood sugar was well controlled.   Hospital Course:  Principal Problem:   Cocaine dependence with cocaine-induced mood disorder (HCC) Active Problems:   TOBACCO ABUSE   Suicidal ideation   Hyperlipidemia associated with type 2 diabetes mellitus (HCC)   Hyperglycemia due to type 1 diabetes mellitus (HCC)   Hyperglycemia   Uncontrolled type 1 diabetes Heart healthy diabetic diet Last A1c 11.4 on 01/25/2018 Continue 70/30 15 units twice daily and NovoLog 0 to 9 units 3 times daily Continue to closely monitor blood sugar  Hypoglycemia with labile blood glucose Hypoglycemia has resolved  Intractable nausea/epigastric pain Lipase negative at 24 Continue PPI and sucralfate  Headaches Tylenol as needed for mild to moderate pain CT head without contrast done on 11/13/2017 was unremarkable for any acute intracranial abnormality.  GERD Continue PPI Continue Carafate  Major depression Continue treatment at the behavioral health  Polysubstance abuse Cessation counseling at bedside Continue treatment at the behavioral health   Procedures:  None  Consultations:  Diabetes coordinator  Discharge Exam: BP 101/73 (BP Location: Left Arm)   Pulse 69   Temp 98 F (36.7 C) (Oral)   Resp 15   Ht 5\' 10"  (1.778 m)   Wt 68.4 kg (150 lb 12.7 oz)   SpO2 100%   BMI 21.64 kg/m  . General: 47 y.o. year-old male well-developed well-nourished in no acute distress.  Alert and oriented x3.   Marland Kitchen  Cardiovascular: Regular rate and rhythm with no rubs or gallops.  No thyromegaly or JVD noted. Marland Kitchen. Respiratory: Clear to auscultation with no wheezes or rales. Good inspiratory effort. . Abdomen: Soft nontender nondistended with normal bowel  sounds x4 quadrants. . Musculoskeletal: No lower extremity edema. 2/4 pulses in all 4 extremities. . Skin: No ulcerative lesions noted or rashes, . Psychiatry: Mood is appropriate for condition and setting  Discharge Instructions You were cared for by a hospitalist during your hospital stay. If you have any questions about your discharge medications or the care you received while you were in the hospital after you are discharged, you can call the unit and asked to speak with the hospitalist on call if the hospitalist that took care of you is not available. Once you are discharged, your primary care physician will handle any further medical issues. Please note that NO REFILLS for any discharge medications will be authorized once you are discharged, as it is imperative that you return to your primary care physician (or establish a relationship with a primary care physician if you do not have one) for your aftercare needs so that they can reassess your need for medications and monitor your lab values.   Allergies as of 02/28/2018   No Known Allergies     Medication List    STOP taking these medications   cyclobenzaprine 10 MG tablet Commonly known as:  FLEXERIL   feeding supplement (ENSURE ENLIVE) Liqd   insulin NPH-regular Human (70-30) 100 UNIT/ML injection Commonly known as:  NOVOLIN 70/30   insulin regular 100 units/mL injection Commonly known as:  NOVOLIN R,HUMULIN R   levofloxacin 750 MG tablet Commonly known as:  LEVAQUIN   meloxicam 15 MG tablet Commonly known as:  MOBIC   nicotine 21 mg/24hr patch Commonly known as:  NICODERM CQ - dosed in mg/24 hours   protein supplement shake Liqd Commonly known as:  PREMIER PROTEIN     TAKE these medications   FLUoxetine 10 MG capsule Commonly known as:  PROZAC Take 1 capsule (10 mg total) by mouth daily. Start taking on:  03/01/2018   insulin aspart 100 UNIT/ML injection Commonly known as:  novoLOG Inject 0-5 Units into the skin  at bedtime.   insulin aspart 100 UNIT/ML injection Commonly known as:  novoLOG Inject 0-9 Units into the skin 3 (three) times daily with meals.   insulin aspart protamine- aspart (70-30) 100 UNIT/ML injection Commonly known as:  NOVOLOG MIX 70/30 Inject 0.15 mLs (15 Units total) into the skin 2 (two) times daily with a meal.   Insulin Syringe-Needle U-100 29G X 1/2" 0.3 ML Misc Commonly known as:  SAFETY-GLIDE 0.3CC SYR 29GX1/2 Use as directed.   omeprazole 20 MG capsule Commonly known as:  PRILOSEC Take 1 capsule (20 mg total) by mouth daily.   sucralfate 1 g tablet Commonly known as:  CARAFATE Take 1 tablet (1 g total) by mouth 4 (four) times daily -  with meals and at bedtime.      No Known Allergies    The results of significant diagnostics from this hospitalization (including imaging, microbiology, ancillary and laboratory) are listed below for reference.    Significant Diagnostic Studies: Dg Abd Acute W/chest  Result Date: 02/22/2018 CLINICAL DATA:  Nausea, vomiting and abdominal pain x2 days. EXAM: DG ABDOMEN ACUTE W/ 1V CHEST COMPARISON:  CXR 11/13/2017, CT AP 05/06/2017 FINDINGS: There is no evidence of dilated bowel loops or free intraperitoneal air. No radiopaque calculi or other significant radiographic  abnormality is seen. Pelvic phleboliths are present bilaterally. Heart size and mediastinal contours are within normal limits. Both lungs are clear. IMPRESSION: Negative abdominal radiographs.  No acute cardiopulmonary disease. Electronically Signed   By: Tollie Eth M.D.   On: 02/22/2018 02:58    Microbiology: No results found for this or any previous visit (from the past 240 hour(s)).   Labs: Basic Metabolic Panel: Recent Labs  Lab 02/23/18 1834 02/24/18 0813 02/25/18 0356 02/26/18 0443 02/28/18 0849  NA 137 139 139 141 138  K 4.0 3.8 4.8 3.8 4.6  CL 101 104 107 106 101  CO2 23 29 26  32 30  GLUCOSE 407* 132* 44* 88 311*  BUN 26* 14 11 13 17     CREATININE 1.47* 1.00 0.83 0.93 0.95  CALCIUM 8.5* 8.3* 7.9* 8.1* 8.7*   Liver Function Tests: Recent Labs  Lab 02/22/18 0207 02/26/18 0443  AST 24 73*  ALT 54 51*  ALKPHOS 73 53  BILITOT 1.8* 0.6  PROT 6.3* 4.9*  ALBUMIN 3.5 2.7*   Recent Labs  Lab 02/22/18 0207  LIPASE 20   No results for input(s): AMMONIA in the last 168 hours. CBC: Recent Labs  Lab 02/22/18 0207 02/22/18 1949 02/22/18 2338 02/23/18 1834 02/24/18 1038 02/26/18 0443  WBC 4.6 5.2  --  5.2 3.9* 3.2*  NEUTROABS  --   --   --  3.8  --   --   HGB 12.3* 11.6* 11.6* 11.1* 10.3* 10.2*  HCT 37.0* 35.6* 34.0* 34.1* 31.3* 31.5*  MCV 85.6 87.3  --  85.9 85.5 86.3  PLT 344 327  --  307 305 273   Cardiac Enzymes: No results for input(s): CKTOTAL, CKMB, CKMBINDEX, TROPONINI in the last 168 hours. BNP: BNP (last 3 results) No results for input(s): BNP in the last 8760 hours.  ProBNP (last 3 results) No results for input(s): PROBNP in the last 8760 hours.  CBG: Recent Labs  Lab 02/27/18 1959 02/27/18 2334 02/28/18 0401 02/28/18 0751 02/28/18 1133  GLUCAP 114* 106* 308* 394* 172*       Signed:  Darlin Drop, MD [facility] 02/28/2018, 12:14 PM

## 2018-03-30 ENCOUNTER — Inpatient Hospital Stay: Payer: Self-pay

## 2018-03-31 ENCOUNTER — Other Ambulatory Visit: Payer: Self-pay

## 2018-03-31 ENCOUNTER — Encounter (HOSPITAL_COMMUNITY): Payer: Self-pay | Admitting: Obstetrics and Gynecology

## 2018-03-31 ENCOUNTER — Inpatient Hospital Stay (HOSPITAL_COMMUNITY)
Admission: EM | Admit: 2018-03-31 | Discharge: 2018-04-02 | DRG: 638 | Disposition: A | Discharge status: Home / Self Care | Payer: Self-pay | Attending: Internal Medicine | Admitting: Internal Medicine

## 2018-03-31 ENCOUNTER — Emergency Department (HOSPITAL_COMMUNITY): Payer: Self-pay

## 2018-03-31 DIAGNOSIS — Z91120 Patient's intentional underdosing of medication regimen due to financial hardship: Secondary | ICD-10-CM

## 2018-03-31 DIAGNOSIS — E1110 Type 2 diabetes mellitus with ketoacidosis without coma: Secondary | ICD-10-CM

## 2018-03-31 DIAGNOSIS — E109 Type 1 diabetes mellitus without complications: Secondary | ICD-10-CM | POA: Diagnosis present

## 2018-03-31 DIAGNOSIS — F1424 Cocaine dependence with cocaine-induced mood disorder: Secondary | ICD-10-CM | POA: Diagnosis present

## 2018-03-31 DIAGNOSIS — Z79899 Other long term (current) drug therapy: Secondary | ICD-10-CM

## 2018-03-31 DIAGNOSIS — T383X6A Underdosing of insulin and oral hypoglycemic [antidiabetic] drugs, initial encounter: Secondary | ICD-10-CM | POA: Diagnosis present

## 2018-03-31 DIAGNOSIS — R0789 Other chest pain: Secondary | ICD-10-CM | POA: Diagnosis present

## 2018-03-31 DIAGNOSIS — F319 Bipolar disorder, unspecified: Secondary | ICD-10-CM | POA: Diagnosis present

## 2018-03-31 DIAGNOSIS — F17210 Nicotine dependence, cigarettes, uncomplicated: Secondary | ICD-10-CM | POA: Diagnosis present

## 2018-03-31 DIAGNOSIS — E1010 Type 1 diabetes mellitus with ketoacidosis without coma: Principal | ICD-10-CM | POA: Diagnosis present

## 2018-03-31 DIAGNOSIS — N179 Acute kidney failure, unspecified: Secondary | ICD-10-CM | POA: Diagnosis present

## 2018-03-31 LAB — BASIC METABOLIC PANEL
Anion gap: 28 — ABNORMAL HIGH (ref 5–15)
Anion gap: 24 — ABNORMAL HIGH (ref 5–15)
Anion gap: 16 — ABNORMAL HIGH (ref 5–15)
BUN: 30 mg/dL — AB (ref 6–20)
BUN: 30 mg/dL — AB (ref 6–20)
BUN: 28 mg/dL — AB (ref 6–20)
CHLORIDE: 96 mmol/L — AB (ref 98–111)
CHLORIDE: 102 mmol/L (ref 98–111)
CHLORIDE: 105 mmol/L (ref 98–111)
CO2: 18 mmol/L — AB (ref 22–32)
CO2: 16 mmol/L — AB (ref 22–32)
CO2: 23 mmol/L (ref 22–32)
CREATININE: 1.69 mg/dL — AB (ref 0.61–1.24)
CREATININE: 1.77 mg/dL — AB (ref 0.61–1.24)
Calcium: 9.9 mg/dL (ref 8.9–10.3)
Calcium: 8.9 mg/dL (ref 8.9–10.3)
Calcium: 9 mg/dL (ref 8.9–10.3)
Creatinine, Ser: 1.38 mg/dL — ABNORMAL HIGH (ref 0.61–1.24)
GFR calc Af Amer: 54 mL/min — ABNORMAL LOW (ref >60)
GFR calc Af Amer: 51 mL/min — ABNORMAL LOW (ref >60)
GFR calc Af Amer: >60 mL/min (ref >60)
GFR calc non Af Amer: 46 mL/min — ABNORMAL LOW (ref >60)
GFR calc non Af Amer: 44 mL/min — ABNORMAL LOW (ref >60)
GFR calc non Af Amer: 59 mL/min — ABNORMAL LOW (ref >60)
GLUCOSE: 566 mg/dL — AB (ref 70–99)
Glucose, Bld: 386 mg/dL — ABNORMAL HIGH (ref 70–99)
Glucose, Bld: 156 mg/dL — ABNORMAL HIGH (ref 70–99)
POTASSIUM: 4.6 mmol/L (ref 3.5–5.1)
POTASSIUM: 4.2 mmol/L (ref 3.5–5.1)
Potassium: 4.8 mmol/L (ref 3.5–5.1)
SODIUM: 142 mmol/L (ref 135–145)
Sodium: 142 mmol/L (ref 135–145)
Sodium: 144 mmol/L (ref 135–145)

## 2018-03-31 LAB — CBC
HEMATOCRIT: 46.9 % (ref 39.0–52.0)
Hemoglobin: 15.1 g/dL (ref 13.0–17.0)
MCH: 29.3 pg (ref 26.0–34.0)
MCHC: 32.2 g/dL (ref 30.0–36.0)
MCV: 91.1 fL (ref 78.0–100.0)
PLATELETS: 342 10*3/uL (ref 150–400)
RBC: 5.15 MIL/uL (ref 4.22–5.81)
RDW: 15.5 % (ref 11.5–15.5)
WBC: 8.1 10*3/uL (ref 4.0–10.5)

## 2018-03-31 LAB — PHOSPHORUS
PHOSPHORUS: 4.1 mg/dL (ref 2.5–4.6)
Phosphorus: 6 mg/dL — ABNORMAL HIGH (ref 2.5–4.6)
Phosphorus: 5.6 mg/dL — ABNORMAL HIGH (ref 2.5–4.6)

## 2018-03-31 LAB — URINALYSIS, ROUTINE W REFLEX MICROSCOPIC
BILIRUBIN URINE: NEGATIVE
Bacteria, UA: NONE SEEN
Glucose, UA: >=500 mg/dL — AB
Ketones, ur: 80 mg/dL — AB
LEUKOCYTES UA: NEGATIVE
Nitrite: NEGATIVE
Protein, ur: NEGATIVE mg/dL
SPECIFIC GRAVITY, URINE: 1.023 (ref 1.005–1.030)
pH: 5 (ref 5.0–8.0)

## 2018-03-31 LAB — CBG MONITORING, ED
GLUCOSE-CAPILLARY: 503 mg/dL — AB (ref 70–99)
Glucose-Capillary: 540 mg/dL (ref 70–99)

## 2018-03-31 LAB — MRSA PCR SCREENING: MRSA by PCR: NEGATIVE

## 2018-03-31 LAB — GLUCOSE, CAPILLARY
GLUCOSE-CAPILLARY: 243 mg/dL — AB (ref 70–99)
GLUCOSE-CAPILLARY: 149 mg/dL — AB (ref 70–99)
GLUCOSE-CAPILLARY: 136 mg/dL — AB (ref 70–99)
Glucose-Capillary: 126 mg/dL — ABNORMAL HIGH (ref 70–99)
Glucose-Capillary: 311 mg/dL — ABNORMAL HIGH (ref 70–99)
Glucose-Capillary: 530 mg/dL (ref 70–99)

## 2018-03-31 LAB — MAGNESIUM
MAGNESIUM: 2.4 mg/dL (ref 1.7–2.4)
MAGNESIUM: 2.5 mg/dL — AB (ref 1.7–2.4)
Magnesium: 2.6 mg/dL — ABNORMAL HIGH (ref 1.7–2.4)

## 2018-03-31 LAB — RAPID URINE DRUG SCREEN, HOSP PERFORMED
AMPHETAMINES: NOT DETECTED
BARBITURATES: NOT DETECTED
BENZODIAZEPINES: NOT DETECTED
COCAINE: POSITIVE — AB
Opiates: NOT DETECTED
Tetrahydrocannabinol: NOT DETECTED

## 2018-03-31 LAB — LIPASE, BLOOD: LIPASE: 31 U/L (ref 11–51)

## 2018-03-31 LAB — TROPONIN I

## 2018-03-31 MED ORDER — SODIUM CHLORIDE 0.9 % IV SOLN
INTRAVENOUS | Status: DC
  Administered 2018-03-31: 4.4 [IU]/h via INTRAVENOUS
  Filled 2018-03-31: qty 1

## 2018-03-31 MED ORDER — DEXTROSE-NACL 5-0.45 % IV SOLN
INTRAVENOUS | Status: DC
  Administered 2018-03-31: 22:00:00 via INTRAVENOUS

## 2018-03-31 MED ORDER — ONDANSETRON HCL 4 MG/2ML IJ SOLN: 4.0000 mg | Freq: Four times a day (QID) | INTRAMUSCULAR | Status: DC | PRN

## 2018-03-31 MED ORDER — PROMETHAZINE HCL 25 MG/ML IJ SOLN
12.5000 mg | Freq: Four times a day (QID) | INTRAMUSCULAR | Status: DC | PRN
Start: 2018-03-31 — End: 2018-04-02

## 2018-03-31 MED ORDER — SODIUM CHLORIDE 0.9 % IV SOLN
INTRAVENOUS | Status: AC
  Administered 2018-03-31: 17:00:00 via INTRAVENOUS

## 2018-03-31 MED ORDER — SODIUM CHLORIDE 0.9 % IV SOLN
INTRAVENOUS | Status: DC
  Administered 2018-03-31: 17:00:00 via INTRAVENOUS

## 2018-03-31 MED ORDER — HEPARIN SODIUM (PORCINE) 5000 UNIT/ML IJ SOLN
5000.0000 [IU] | Freq: Three times a day (TID) | INTRAMUSCULAR | Status: DC
  Administered 2018-04-01 – 2018-04-02 (×4): 5000 [IU] via SUBCUTANEOUS
  Filled 2018-03-31 (×4): qty 1

## 2018-03-31 MED ORDER — MORPHINE SULFATE (PF) 2 MG/ML IV SOLN
1.0000 mg | INTRAVENOUS | Status: DC | PRN
  Administered 2018-03-31 – 2018-04-02 (×7): 1 mg via INTRAVENOUS
  Filled 2018-03-31 (×7): qty 1

## 2018-03-31 MED ORDER — POTASSIUM CHLORIDE 10 MEQ/100ML IV SOLN
10.0000 meq | INTRAVENOUS | Status: AC
  Administered 2018-03-31 (×2): 10 meq via INTRAVENOUS
  Filled 2018-03-31 (×2): qty 100

## 2018-03-31 NOTE — ED Provider Notes (Signed)
[facility]-EMERGENCY DEPT Provider Note  CSN: [id number] Arrival date & time: 03/31/18  1444   History   Chief Complaint Chief Complaint  Patient presents with  . Hyperglycemia    HPI John Cox is a 47 y.o. male with a medical history of Type 1 DM, substance use and HLD who presented to the ED for hyperglycemia. Patient states he has been without his insulin for 2 days because he ran out of it and has been unable to buy more. Current endorses fatigue, abdominal pain, N/V, SOB, headache and blurred vision. Denies fever, upper respiratory symptoms, chest pain or symptoms that would suggest an infectious etiology.  Additional history obtained by medical chart. Patient last admitted in 01/2018 for DKA. Patient currently does not have a PCP and states he learned how to do sliding scale insulin in the hospital. Currently gets his medications from [facility].  Past Medical History:  Diagnosis Date  . AKI (acute kidney injury) (HCC) 01/25/2018  . Anxiety   . Bipolar disorder (HCC)   . Depression 2000  . Diabetes mellitus 1995  . Hyperglycemia 01/25/2018  . Nausea & vomiting   . Polysubstance abuse (HCC) 2012   relapse 2 wk ago   . Suicidal ideation     Patient Active Problem List   Diagnosis Date Noted  . DKA, type 1 (HCC) 03/31/2018  . Hyperglycemia due to type 1 diabetes mellitus (HCC) 02/23/2018  . Hyperglycemia 02/23/2018  . Cocaine dependence with cocaine-induced mood disorder (HCC)   . MDD (major depressive disorder), recurrent severe, without psychosis (HCC) 02/22/2018  . Tachycardia 01/25/2018  . CAP (community acquired pneumonia) 01/25/2018  . Hypoglycemia due to insulin 11/13/2017  . Hypoglycemia 11/13/2017  . Malnutrition of moderate degree 07/19/2015  . DKA (diabetic ketoacidoses) (HCC) 07/18/2015  . DKA, type 2, not at goal [facility]) 07/18/2015  . Hyperkalemia 07/18/2015  . Acute kidney injury (HCC) 07/18/2015  . Nausea vomiting and diarrhea  07/18/2015  . Marijuana abuse 07/18/2015  . Dehydration 07/18/2015  . Dental caries 07/18/2015  . Hyperlipidemia associated with type 2 diabetes mellitus (HCC) 05/24/2014  . Polysubstance abuse (HCC) 05/13/2014  . Major depression, recurrent (HCC) 05/13/2014  . Suicidal ideation 05/13/2014  . ANTICARDIOLIPIN ANTIBODY SYNDROME 05/12/2007  . Type 1 diabetes mellitus (HCC) 04/26/2007  . TOBACCO ABUSE 04/26/2007  . 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 NO PAST SURGERIES          Home Medications    Prior to Admission medications   Medication Sig Start Date End Date Taking? Authorizing Provider  insulin aspart protamine- aspart (NOVOLOG MIX 70/30) (70-30) 100 UNIT/ML injection Inject 0.15 mLs (15 Units total) into the skin 2 (two) times daily with a meal. 02/28/18  Yes Hall, Carole N, DO  FLUoxetine (PROZAC) 10 MG capsule Take 1 capsule (10 mg total) by mouth daily. Patient not taking: Reported on 03/31/2018 03/01/18   Darlin Drop, DO  insulin aspart (NOVOLOG) 100 UNIT/ML injection Inject 0-5 Units into the skin at bedtime. Patient not taking: Reported on 03/31/2018 02/28/18   Darlin Drop, DO  insulin aspart (NOVOLOG) 100 UNIT/ML injection Inject 0-9 Units into the skin 3 (three) times daily with meals. Patient not taking: Reported on 03/31/2018 02/28/18   Darlin Drop, DO  Insulin Syringe-Needle U-100 (SAFETY-GLIDE 0.3CC SYR 29GX1/2) 29G X 1/2" 0.3 ML MISC Use as directed. 05/18/14   Hongalgi, Maximino Greenland, MD  omeprazole (PRILOSEC) 20  MG capsule Take 1 capsule (20 mg total) by mouth daily. Patient not taking: Reported on 03/31/2018 02/22/18   Roxy Horseman, PA-C  sucralfate (CARAFATE) 1 g tablet Take 1 tablet (1 g total) by mouth 4 (four) times daily -  with meals and at bedtime. Patient not taking: Reported on 03/31/2018 02/22/18   Roxy Horseman, PA-C    Family History Family History  Problem Relation Age of Onset  . Diabetes  Mother   . COPD Mother   . Heart disease Mother     Social History Social History   Tobacco Use  . Smoking status: Current Every Day Smoker    Packs/day: 1.00    Years: 25.00    Pack years: 25.00    Types: Cigarettes  . Smokeless tobacco: Never Used  Substance Use Topics  . Alcohol use: Yes    Alcohol/week: 0.6 oz    Types: 1 Cans of beer per week    Comment: one beer weekly  . Drug use: Yes    Types: Marijuana, Cocaine    Comment: THC daily, Cocaine daily     Allergies   Patient has no known allergies.   Review of Systems Review of Systems  Constitutional: Positive for appetite change and fatigue. Negative for chills and fever.  Respiratory: Positive for chest tightness and shortness of breath.   Cardiovascular: Negative for chest pain, palpitations and leg swelling.  Gastrointestinal: Positive for abdominal pain, nausea and vomiting. Negative for constipation and diarrhea.  Endocrine: Negative.   Genitourinary: Negative.   Skin: Negative.   Allergic/Immunologic: Negative.   Neurological: Positive for headaches. Negative for dizziness, speech difficulty, weakness, light-headedness and numbness.  Hematological: Negative.   Psychiatric/Behavioral: Negative for confusion and decreased concentration.     Physical Exam Updated Vital Signs BP 134/71   Pulse 85   Temp 98.2 F (36.8 C) (Oral)   Resp (!) 22   Ht 5\' 10"  (1.778 m)   Wt 72.6 kg (160 lb)   SpO2 100%   BMI 22.96 kg/m   Physical Exam  Constitutional: He is oriented to person, place, and time.  Underweight. Laying on the bed in discomfort.  HENT:  Head: Normocephalic and atraumatic.  Eyes: Pupils are equal, round, and reactive to light. Conjunctivae, EOM and lids are normal.  Cardiovascular: Normal rate, regular rhythm and normal heart sounds.  Pulmonary/Chest: Effort normal and breath sounds normal.  Endorses chest pain with deep respirations.  Abdominal: Normal appearance and bowel sounds are  normal. There is generalized tenderness. There is guarding.  Abdominal pain with minimal body movements.  Neurological: He is alert and oriented to person, place, and time. No sensory deficit. GCS eye subscore is 4. GCS verbal subscore is 5. GCS motor subscore is 6.  Skin: Skin is warm and intact. Capillary refill takes 2 to 3 seconds. No rash noted.  Nursing note and vitals reviewed.    ED Treatments / Results  Labs (all labs ordered are listed, but only abnormal results are displayed) Labs Reviewed  BASIC METABOLIC PANEL - Abnormal; Notable for the following components:      Result Value   Chloride 96 (*)    CO2 18 (*)    Glucose, Bld 566 (*)    BUN 30 (*)    Creatinine, Ser 1.69 (*)    GFR calc non Af Amer 46 (*)    GFR calc Af Amer 54 (*)    Anion gap 28 (*)    All other components within normal  limits  URINALYSIS, ROUTINE W REFLEX MICROSCOPIC - Abnormal; Notable for the following components:   Color, Urine STRAW (*)    Glucose, UA >=500 (*)    Hgb urine dipstick SMALL (*)    Ketones, ur 80 (*)    All other components within normal limits  PHOSPHORUS - Abnormal; Notable for the following components:   Phosphorus 6.0 (*)    All other components within normal limits  RAPID URINE DRUG SCREEN, HOSP PERFORMED - Abnormal; Notable for the following components:   Cocaine POSITIVE (*)    All other components within normal limits  CBG MONITORING, ED - Abnormal; Notable for the following components:   Glucose-Capillary 540 (*)    All other components within normal limits  CBG MONITORING, ED - Abnormal; Notable for the following components:   Glucose-Capillary 503 (*)    All other components within normal limits  CBC  MAGNESIUM    EKG None  Radiology No results found.  Procedures .Critical Care Performed by: Windy Carina, PA-C Authorized by: Windy Carina, PA-C   Critical care provider statement:    Critical care time (minutes):  25   Critical care start  time:  03/31/2018 4:16 PM   Critical care end time:  03/31/2018 4:41 PM   Critical care time was exclusive of:  Separately billable procedures and treating other patients   Critical care was necessary to treat or prevent imminent or life-threatening deterioration of the following conditions:  Dehydration and endocrine crisis   Critical care was time spent personally by me on the following activities:  Development of treatment plan with patient or surrogate, discussions with consultants, evaluation of patient's response to treatment, ordering and review of laboratory studies and review of old charts   I assumed direction of critical care for this patient from another provider in my specialty: no   Comments:     Patient presented in DKA. Initiated IV fluid, potassium and insulin treatment via DKA order set. Care transferred to [facility] who will admit patient.   (including critical care time)  Medications Ordered in ED Medications  0.9 %  sodium chloride infusion ( Intravenous New Bag/Given 03/31/18 1644)  insulin regular (NOVOLIN R,HUMULIN R) 100 Units in sodium chloride 0.9 % 100 mL (1 Units/mL) infusion (has no administration in time range)  potassium chloride 10 mEq in 100 mL IVPB (has no administration in time range)  dextrose 5 %-0.45 % sodium chloride infusion (has no administration in time range)  0.9 %  sodium chloride infusion (has no administration in time range)     Initial Impression / Assessment and Plan / ED Course  Triage vital signs and the nursing notes have been reviewed.  Pertinent labs & imaging results that were available during care of the patient were reviewed and considered in medical decision making (see chart for details).  Patient presents with hyperglycemia and concerns for DKA. Patient has been without insulin for at least 2 days which could have precipitated this. Denies s/s that would suggest an infectious etiology to this.  Clinical Course as of Mar 31 1644  Wed Mar 31, 2018  1623 Patient's labs consistent with DKA. Case discussed with Dr. Arby Barrette. DKA order set initiated for treatment. Consult placed to [facility] for admission.   [GM]  1627 Case discussed with [facility] who will admit the patient.   [GM]    Clinical Course User Index [GM] Windy Carina, PA-C   Patient requires inpatient hospitalization  for further treatment of his DKA. Would also benefit from social work involvement as this is a recurrent issue for the patient because of his lack of access to PCP and inconsistency with insulin management.  Final Clinical Impressions(s) / ED Diagnoses  1. DKA. 2/2 insulin non-compliance. DKA order set initiated which includes IV fluid, potassium and insulin administration. Case discussed with [facility] who will admit.  Dispo: Admit.  Final diagnoses:  DKA (diabetic ketoacidoses) [facility](HCC)    ED Discharge Orders    None        Reva BoresMortis, Leolia Vinzant I, PA-C 03/31/18 1645    Arby BarrettePfeiffer, Marcy, MD 04/01/18 0040

## 2018-03-31 NOTE — H&P (Signed)
History and Physical    John Cox WGN:[id number] DOB: 1971-01-05 DOA: 03/31/2018  PCP: Patient, No Pcp Per  Patient coming from: Home  Chief Complaint: vomiting, abdominal pain   HPI: John Cox is a 47 y.o. male with medical history significant of DM type 1, mood disorder, bipolar disorder, depression, cocaine use who was recently admitted for hyperglycemia in June 2019 who now presents with vomiting for 1 day.  He states that his vomiting episode and generalized abdominal pain started yesterday.  He has missed 1/2 days of insulin so far due to running out and not being able to afford it.  He also admits to left-sided chest pain which is difficult to describe, which started this morning.  It has been constant since the ambulance ride.  It is reproducible with palpation.  Denies any fevers, shortness of breath, cough.  Had some diarrhea starting as well.  ED Course: Labs revealed blood glucose 566, creatinine 1.69, CO2 18, UDS positive for cocaine, UA with ketones.  Chest x-ray without active disease.  He was started on DKA protocol and admitted for further treatment.  Review of Systems: As per HPI otherwise 10 point review of systems negative.   Past Medical History:  Diagnosis Date  . AKI (acute kidney injury) (HCC) 01/25/2018  . Anxiety   . Bipolar disorder (HCC)   . Depression 2000  . Diabetes mellitus 1995  . Hyperglycemia 01/25/2018  . Nausea & vomiting   . Polysubstance abuse (HCC) 2012   relapse 2 wk ago   . Suicidal ideation     Past Surgical History:  Procedure Laterality Date  . NO PAST SURGERIES       reports that he has been smoking cigarettes.  He has a 25.00 pack-year smoking history. He has never used smokeless tobacco. He reports that he drinks about 0.6 oz of alcohol per week. He reports that he has current or past drug history. Drugs: Marijuana and Cocaine.  No Known Allergies  Family History  Problem Relation Age of Onset  . Diabetes Mother   .  COPD Mother   . Heart disease Mother     Prior to Admission medications   Medication Sig Start Date End Date Taking? Authorizing Provider  FLUoxetine (PROZAC) 10 MG capsule Take 1 capsule (10 mg total) by mouth daily. 03/01/18   Darlin Drop, DO  insulin aspart (NOVOLOG) 100 UNIT/ML injection Inject 0-5 Units into the skin at bedtime. 02/28/18   Darlin Drop, DO  insulin aspart (NOVOLOG) 100 UNIT/ML injection Inject 0-9 Units into the skin 3 (three) times daily with meals. 02/28/18   Darlin Drop, DO  insulin aspart protamine- aspart (NOVOLOG MIX 70/30) (70-30) 100 UNIT/ML injection Inject 0.15 mLs (15 Units total) into the skin 2 (two) times daily with a meal. 02/28/18   Hall, Carole N, DO  Insulin Syringe-Needle U-100 (SAFETY-GLIDE 0.3CC SYR 29GX1/2) 29G X 1/2" 0.3 ML MISC Use as directed. 05/18/14   Hongalgi, Maximino Greenland, MD  omeprazole (PRILOSEC) 20 MG capsule Take 1 capsule (20 mg total) by mouth daily. 02/22/18   Roxy Horseman, PA-C  sucralfate (CARAFATE) 1 g tablet Take 1 tablet (1 g total) by mouth 4 (four) times daily -  with meals and at bedtime. 02/22/18   Roxy Horseman, PA-C    Physical Exam: Vitals:   03/31/18 1453 03/31/18 1456 03/31/18 1633  BP:  140/77 134/71  Pulse:  91 85  Resp:  14 (!) 22  Temp:  98.2  F (36.8 C)   TempSrc:  Oral   SpO2:  98% 100%  Weight: 73.5 kg (162 lb) 72.6 kg (160 lb)   Height: 5\' 10"  (1.778 m) 5\' 10"  (1.778 m)      Constitutional: NAD, calm, uncomfortable Eyes: PERRL, lids and conjunctivae normal ENMT: Mucous membranes are dry. Posterior pharynx clear of any exudate or lesions.Normal dentition.  Neck: normal, supple, no masses, no thyromegaly Respiratory: clear to auscultation bilaterally, no wheezing, no crackles. Normal respiratory effort. No accessory muscle use.  Cardiovascular: Regular rate and rhythm, no murmurs / rubs / gallops. No extremity edema.  Abdomen: Tenderness to palpation diffusely, no voluntary guarding, soft to touch  without distention. No hepatosplenomegaly. Bowel sounds positive.  Musculoskeletal: no clubbing / cyanosis. No joint deformity upper and lower extremities. Normal muscle tone.  Skin: no rashes, lesions, ulcers. No induration Neurologic: CN 2-12 grossly intact. Strength 5/5 in all 4.  Psychiatric: Normal judgment and insight. Alert and oriented x 3. Normal mood.   Labs on Admission: I have personally reviewed following labs and imaging studies  CBC: Recent Labs  Lab 03/31/18 1523  WBC 8.1  HGB 15.1  HCT 46.9  MCV 91.1  PLT 342   Basic Metabolic Panel: Recent Labs  Lab 03/31/18 1523  NA 142  K 4.8  CL 96*  CO2 18*  GLUCOSE 566*  BUN 30*  CREATININE 1.69*  CALCIUM 9.9  MG 2.4  PHOS 6.0*   GFR: Estimated Creatinine Clearance: 54.9 mL/min (A) (by C-G formula based on SCr of 1.69 mg/dL (H)). Liver Function Tests: No results for input(s): AST, ALT, ALKPHOS, BILITOT, PROT, ALBUMIN in the last 168 hours. No results for input(s): LIPASE, AMYLASE in the last 168 hours. No results for input(s): AMMONIA in the last 168 hours. Coagulation Profile: No results for input(s): INR, PROTIME in the last 168 hours. Cardiac Enzymes: No results for input(s): CKTOTAL, CKMB, CKMBINDEX, TROPONINI in the last 168 hours. BNP (last 3 results) No results for input(s): PROBNP in the last 8760 hours. HbA1C: No results for input(s): HGBA1C in the last 72 hours. CBG: Recent Labs  Lab 03/31/18 1453 03/31/18 1632  GLUCAP 540* 503*   Lipid Profile: No results for input(s): CHOL, HDL, LDLCALC, TRIG, CHOLHDL, LDLDIRECT in the last 72 hours. Thyroid Function Tests: No results for input(s): TSH, T4TOTAL, FREET4, T3FREE, THYROIDAB in the last 72 hours. Anemia Panel: No results for input(s): VITAMINB12, FOLATE, FERRITIN, TIBC, IRON, RETICCTPCT in the last 72 hours. Urine analysis:    Component Value Date/Time   COLORURINE STRAW (A) 03/31/2018 1544   APPEARANCEUR CLEAR 03/31/2018 1544   LABSPEC  1.023 03/31/2018 1544   PHURINE 5.0 03/31/2018 1544   GLUCOSEU >=500 (A) 03/31/2018 1544   HGBUR SMALL (A) 03/31/2018 1544   HGBUR negative 05/12/2007 1355   BILIRUBINUR NEGATIVE 03/31/2018 1544   BILIRUBINUR negative 05/23/2014 1052   KETONESUR 80 (A) 03/31/2018 1544   PROTEINUR NEGATIVE 03/31/2018 1544   UROBILINOGEN 0.2 10/04/2014 1749   NITRITE NEGATIVE 03/31/2018 1544   LEUKOCYTESUR NEGATIVE 03/31/2018 1544   Sepsis Labs: !!!!!!!!!!!!!!!!!!!!!!!!!!!!!!!!!!!!!!!!!!!! @LABRCNTIP (procalcitonin:4,lacticidven:4) )No results found for this or any previous visit (from the past 240 hour(s)).   Radiological Exams on Admission: Portable Chest X-ray (1 View)  Result Date: 03/31/2018 CLINICAL DATA:  47 y/o  M; 3 episodes of vomiting. EXAM: PORTABLE CHEST 1 VIEW COMPARISON:  02/22/2018 chest and abdomen radiographs. FINDINGS: Stable heart size and mediastinal contours are within normal limits. Both lungs are clear. The visualized skeletal structures are  unremarkable. IMPRESSION: No active disease. Electronically Signed   By: Mitzi HansenLance  Furusawa-Stratton M.D.   On: 03/31/2018 16:43     Assessment/Plan Principal Problem:   DKA, type 1 (HCC) Active Problems:   Type 1 diabetes mellitus (HCC)   Acute kidney injury (HCC)   Cocaine dependence with cocaine-induced mood disorder (HCC)   DKA -Insulin gtt -IVF -Replace K, Mg aggressively -Trend BMP, Mg, Phos every 4 hours -Case manager, diabetic coordinator consult  Acute kidney injury -IVF  Atypical chest pain -Reproducible with palpation -Trend trop, obtain EKG   Mood disorder -Currently not taking Prozac, cannot afford  Cocaine abuse -SW consult     DVT prophylaxis: Subq hep  Code Status: Full code Family Communication: No family at bedside Disposition Plan: Pending improvement in blood sugar Consults called: None  Admission status: Observation   Severity of Illness: The appropriate patient status for this patient is  OBSERVATION. Observation status is judged to be reasonable and necessary in order to provide the required intensity of service to ensure the patient's safety. The patient's presenting symptoms, physical exam findings, and initial radiographic and laboratory data in the context of their medical condition is felt to place them at decreased risk for further clinical deterioration. Furthermore, it is anticipated that the patient will be medically stable for discharge from the hospital within 2 midnights of admission.   Noralee StainJennifer Lakesha Levinson, DO [facility] [email or web address] Password [email or web address] 03/31/2018, 5:00 PM

## 2018-03-31 NOTE — ED Notes (Signed)
Date and time results received: 03/31/18 16:09  Test: Glucose Critical Value: 566  Name of Provider Notified: Eual FinesNotified Gabrielle, PA via telephone.   Orders Received? Or Actions Taken?: Will continue to monitor and wait for new orders.

## 2018-03-31 NOTE — ED Notes (Signed)
ED TO INPATIENT HANDOFF REPORT  Name/Age/Gender John Cox 47 y.o. male  Code Status Code Status History    Date Active Date Inactive Code Status Order ID Comments User Context   02/22/2018 1442 02/22/2018 1913 Full Code [id number]  Niel Hummer, NP Inpatient   01/25/2018 0105 01/26/2018 1441 Full Code [id number]  Jani Gravel, MD ED   11/13/2017 2146 11/15/2017 1600 Full Code [id number]  Toy Baker, MD Inpatient   07/18/2015 1802 07/19/2015 1919 Full Code [id number]  Modena Jansky, MD Inpatient   05/14/2014 2357 05/18/2014 2018 Full Code [id number]  Berle Mull, MD ED   05/13/2014 1244 05/14/2014 1939 Full Code [id number]  Piepenbrink, [facility] ED      Home/SNF/Other Home  Chief Complaint Hyperglycemia  Level of Care/Admitting Diagnosis ED Disposition    ED Disposition Condition Comment   [facility] [100102]  Level of Care: Stepdown [14]  Admit to SDU based on following criteria: Severe physiological/psychological symptoms:  Any diagnosis requiring assessment & intervention at least every 4 hours on an ongoing basis to obtain desired patient outcomes including stability and rehabilitation  Diagnosis: DKA, type 1 [facility]) [832549]  Admitting Physician: Dessa Phi [phone]  Attending Physician: Dessa Phi [phone]  PT Class (Do Not Modify): Observation [104]  PT Acc Code (Do Not Modify): Observation [10022]       Medical History Past Medical History:  Diagnosis Date  . AKI (acute kidney injury) ([place]) 01/25/2018  . Anxiety   . Bipolar disorder ([place])   . Depression 2000  . Diabetes mellitus 1995  . Hyperglycemia 01/25/2018  . Nausea & vomiting   . Polysubstance abuse ([place]) 2012   relapse 2 wk ago   . Suicidal ideation     Allergies No Known Allergies  IV Location/Drains/Wounds Patient Lines/Drains/Airways Status   Active Line/Drains/Airways    Name:   Placement date:   Placement time:   Site:    Days:   Peripheral IV 03/31/18 Left Antecubital   03/31/18    1643    Antecubital   less than 1          Labs/Imaging Results for orders placed or performed during the hospital encounter of 03/31/18 (from the past 48 hour(s))  CBG monitoring, ED     Status: Abnormal   Collection Time: 03/31/18  2:53 PM  Result Value Ref Range   Glucose-Capillary 540 (HH) 70 - 99 mg/dL   Comment 1 /ADG   Basic metabolic panel     Status: Abnormal   Collection Time: 03/31/18  3:23 PM  Result Value Ref Range   Sodium 142 135 - 145 mmol/L   Potassium 4.8 3.5 - 5.1 mmol/L   Chloride 96 (L) 98 - 111 mmol/L   CO2 18 (L) 22 - 32 mmol/L   Glucose, Bld 566 (HH) 70 - 99 mg/dL    Comment: CRITICAL RESULT CALLED TO, READ BACK BY AND VERIFIED WITH: [facility] RN [id number] [place],N    BUN 30 (H) 6 - 20 mg/dL   Creatinine, Ser 1.69 (H) 0.61 - 1.24 mg/dL   Calcium 9.9 8.9 - 10.3 mg/dL   GFR calc non Af Amer 46 (L) >60 mL/min   GFR calc Af Amer 54 (L) >60 mL/min    Comment: (NOTE) The eGFR has been calculated using the CKD EPI equation. This calculation has not been validated in all clinical situations. eGFR's persistently <60 mL/min signify possible Chronic Kidney Disease.    Anion  gap 28 (H) 5 - 15    Comment: Performed at [facility], [place] [address] [place], [place] [address]  CBC     Status: None   Collection Time: 03/31/18  3:23 PM  Result Value Ref Range   WBC 8.1 4.0 - 10.5 K/uL   RBC 5.15 4.22 - 5.81 MIL/uL   Hemoglobin 15.1 13.0 - 17.0 g/dL   HCT 46.9 39.0 - 52.0 %   MCV 91.1 78.0 - 100.0 fL   MCH 29.3 26.0 - 34.0 pg   MCHC 32.2 30.0 - 36.0 g/dL   RDW 15.5 11.5 - 15.5 %   Platelets 342 150 - 400 K/uL    Comment: Performed at [facility], [place] [address] [place], [place] [address]  Magnesium     Status: None   Collection Time: 03/31/18  3:23 PM  Result Value Ref Range   Magnesium 2.4 1.7 - 2.4 mg/dL    Comment: Performed at [facility], [place] [address] [place], [place] [address]  Phosphorus     Status: Abnormal   Collection Time: 03/31/18  3:23 PM  Result Value Ref Range   Phosphorus 6.0 (H) 2.5 - 4.6 mg/dL    Comment: Performed at [facility], [place] [address], [place], [place] [address]  Urinalysis, Routine w reflex microscopic     Status: Abnormal   Collection Time: 03/31/18  3:44 PM  Result Value Ref Range   Color, Urine STRAW (A) YELLOW   APPearance CLEAR CLEAR   Specific Gravity, Urine 1.023 1.005 - 1.030   pH 5.0 5.0 - 8.0   Glucose, UA >=500 (A) NEGATIVE mg/dL   Hgb urine dipstick SMALL (A) NEGATIVE   Bilirubin Urine NEGATIVE NEGATIVE   Ketones, ur 80 (A) NEGATIVE mg/dL   Protein, ur NEGATIVE NEGATIVE mg/dL   Nitrite NEGATIVE NEGATIVE   Leukocytes, UA NEGATIVE NEGATIVE   RBC / HPF 0-5 0 - 5 RBC/hpf   Bacteria, UA NONE SEEN NONE SEEN    Comment: Performed at [facility], [place] [address] [place], [place] [address]  Urine rapid drug screen (hosp performed)not at [facility]     Status: Abnormal   Collection Time: 03/31/18  3:44 PM  Result Value Ref Range   Opiates NONE DETECTED NONE DETECTED   Cocaine POSITIVE (A) NONE DETECTED   Benzodiazepines NONE DETECTED NONE DETECTED   Amphetamines NONE DETECTED NONE DETECTED   Tetrahydrocannabinol NONE DETECTED NONE DETECTED   Barbiturates NONE DETECTED NONE DETECTED    Comment: (NOTE) DRUG SCREEN FOR MEDICAL PURPOSES ONLY.  IF CONFIRMATION IS NEEDED FOR ANY PURPOSE, NOTIFY LAB WITHIN 5 DAYS. LOWEST DETECTABLE LIMITS FOR URINE DRUG SCREEN Drug Class                     Cutoff (ng/mL) Amphetamine and metabolites    1000 Barbiturate and metabolites    200 Benzodiazepine                 850 Tricyclics and metabolites     300 Opiates and metabolites        300 Cocaine and metabolites        300 THC                            50 Performed at [facility], [place] [address], [place], [place] [address]    CBG monitoring, ED  Status: Abnormal   Collection Time: 03/31/18  4:32 PM  Result Value Ref Range   Glucose-Capillary 503 (HH) 70 - 99 mg/dL   Comment 1 Notify RN    Comment 2 Document in Chart    Portable Chest X-ray (1 View)  Result Date: 03/31/2018 CLINICAL DATA:  47 y/o  M; 3 episodes of vomiting. EXAM: PORTABLE CHEST 1 VIEW COMPARISON:  02/22/2018 chest and abdomen radiographs. FINDINGS: Stable heart size and mediastinal contours are within normal limits. Both lungs are clear. The visualized skeletal structures are unremarkable. IMPRESSION: No active disease. Electronically Signed   By: Kristine Garbe M.D.   On: 03/31/2018 16:43    Pending Labs [employer] (From admission, onward)   Start     Ordered   Signed and Held  CBC  Tomorrow morning,   R     Signed and Held   Signed and Held  Basic metabolic panel  Now then every 4 hours,   R     Signed and Held   Visual merchandiser and Held  Magnesium  Now then every 4 hours,   R     Signed and Held   Visual merchandiser and Held  Phosphorus  Now then every 4 hours,   R     Signed and Held   Signed and Held  Troponin I (q 6hr x 3)  Now then every 6 hours,   R     Signed and Held   Signed and Held  Lipase, blood  Once,   R     Signed and Held      Vitals/Pain Today's Vitals   03/31/18 1453 03/31/18 1456 03/31/18 1633 03/31/18 1715  BP:  140/77 134/71 139/83  Pulse:  91 85 75  Resp:  14 (!) 22 18  Temp:  98.2 F (36.8 C)    TempSrc:  Oral    SpO2:  98% 100% 100%  Weight: 162 lb (73.5 kg) 160 lb (72.6 kg)    Height: _0  (1.778 m) _1  (1.778 m)      Isolation Precautions No active isolations  Medications Medications  0.9 %  sodium chloride infusion ( Intravenous New Bag/Given 03/31/18 1644)  insulin regular (NOVOLIN R,HUMULIN R) 100 Units in sodium chloride 0.9 % 100 mL (1 Units/mL) infusion (4.4 Units/hr Intravenous New Bag/Given 03/31/18 1652)  potassium chloride 10 mEq in 100 mL IVPB (10 mEq Intravenous New Bag/Given  03/31/18 1647)  dextrose 5 %-0.45 % sodium chloride infusion (has no administration in time range)  0.9 %  sodium chloride infusion ( Intravenous New Bag/Given 03/31/18 1646)    Mobility walks

## 2018-03-31 NOTE — ED Notes (Signed)
Delay in calling report due to giving medication to another emergency department patient. Once I called report, ICU staff had to make up their minds about when to accept the patient due to getting two patients at one time. By the end of the conversation, ICU primary nurse reported patient could come up.

## 2018-03-31 NOTE — ED Triage Notes (Signed)
Per EMS: Pt is coming from home.  Pt has vomited x3 today and has had no insulin. On scene CBG 348 Pt reports not having insulin for a few days.

## 2018-04-01 LAB — BASIC METABOLIC PANEL
Anion gap: 13 (ref 5–15)
Anion gap: 18 — ABNORMAL HIGH (ref 5–15)
Anion gap: 13 (ref 5–15)
Anion gap: 7 (ref 5–15)
Anion gap: 11 (ref 5–15)
Anion gap: 8 (ref 5–15)
BUN: 27 mg/dL — AB (ref 6–20)
BUN: 26 mg/dL — ABNORMAL HIGH (ref 6–20)
BUN: 23 mg/dL — ABNORMAL HIGH (ref 6–20)
BUN: 20 mg/dL (ref 6–20)
BUN: 17 mg/dL (ref 6–20)
BUN: 13 mg/dL (ref 6–20)
CALCIUM: 9 mg/dL (ref 8.9–10.3)
CALCIUM: 8.8 mg/dL — AB (ref 8.9–10.3)
CHLORIDE: 105 mmol/L (ref 98–111)
CHLORIDE: 103 mmol/L (ref 98–111)
CO2: 23 mmol/L (ref 22–32)
CO2: 17 mmol/L — AB (ref 22–32)
CO2: 23 mmol/L (ref 22–32)
CO2: 27 mmol/L (ref 22–32)
CO2: 29 mmol/L (ref 22–32)
CO2: 25 mmol/L (ref 22–32)
CREATININE: 1.27 mg/dL — AB (ref 0.61–1.24)
CREATININE: 1.52 mg/dL — AB (ref 0.61–1.24)
CREATININE: 1.34 mg/dL — AB (ref 0.61–1.24)
CREATININE: 1.12 mg/dL (ref 0.61–1.24)
CREATININE: 0.98 mg/dL (ref 0.61–1.24)
Calcium: 8.9 mg/dL (ref 8.9–10.3)
Calcium: 8.8 mg/dL — ABNORMAL LOW (ref 8.9–10.3)
Calcium: 9.2 mg/dL (ref 8.9–10.3)
Calcium: 8.5 mg/dL — ABNORMAL LOW (ref 8.9–10.3)
Chloride: 100 mmol/L (ref 98–111)
Chloride: 103 mmol/L (ref 98–111)
Chloride: 102 mmol/L (ref 98–111)
Chloride: 104 mmol/L (ref 98–111)
Creatinine, Ser: 1.38 mg/dL — ABNORMAL HIGH (ref 0.61–1.24)
GFR calc Af Amer: >60 mL/min (ref >60)
GFR calc Af Amer: >60 mL/min (ref >60)
GFR calc Af Amer: >60 mL/min (ref >60)
GFR calc Af Amer: >60 mL/min (ref >60)
GFR calc non Af Amer: >60 mL/min (ref >60)
GFR calc non Af Amer: 59 mL/min — ABNORMAL LOW (ref >60)
GFR calc non Af Amer: 53 mL/min — ABNORMAL LOW (ref >60)
GFR calc non Af Amer: >60 mL/min (ref >60)
GFR calc non Af Amer: >60 mL/min (ref >60)
GFR calc non Af Amer: >60 mL/min (ref >60)
GLUCOSE: 245 mg/dL — AB (ref 70–99)
GLUCOSE: 89 mg/dL (ref 70–99)
GLUCOSE: 166 mg/dL — AB (ref 70–99)
Glucose, Bld: 140 mg/dL — ABNORMAL HIGH (ref 70–99)
Glucose, Bld: 333 mg/dL — ABNORMAL HIGH (ref 70–99)
Glucose, Bld: 361 mg/dL — ABNORMAL HIGH (ref 70–99)
POTASSIUM: 4.6 mmol/L (ref 3.5–5.1)
POTASSIUM: 5 mmol/L (ref 3.5–5.1)
Potassium: 4.8 mmol/L (ref 3.5–5.1)
Potassium: 3.9 mmol/L (ref 3.5–5.1)
Potassium: 3.3 mmol/L — ABNORMAL LOW (ref 3.5–5.1)
Potassium: 3.4 mmol/L — ABNORMAL LOW (ref 3.5–5.1)
SODIUM: 141 mmol/L (ref 135–145)
SODIUM: 138 mmol/L (ref 135–145)
SODIUM: 136 mmol/L (ref 135–145)
Sodium: 137 mmol/L (ref 135–145)
Sodium: 142 mmol/L (ref 135–145)
Sodium: 137 mmol/L (ref 135–145)

## 2018-04-01 LAB — CBC
HEMATOCRIT: 41.6 % (ref 39.0–52.0)
HEMOGLOBIN: 13.9 g/dL (ref 13.0–17.0)
MCH: 29 pg (ref 26.0–34.0)
MCHC: 33.4 g/dL (ref 30.0–36.0)
MCV: 86.8 fL (ref 78.0–100.0)
Platelets: 327 10*3/uL (ref 150–400)
RBC: 4.79 MIL/uL (ref 4.22–5.81)
RDW: 15.2 % (ref 11.5–15.5)
WBC: 7.1 10*3/uL (ref 4.0–10.5)

## 2018-04-01 LAB — GLUCOSE, CAPILLARY
GLUCOSE-CAPILLARY: 133 mg/dL — AB (ref 70–99)
GLUCOSE-CAPILLARY: 167 mg/dL — AB (ref 70–99)
GLUCOSE-CAPILLARY: 204 mg/dL — AB (ref 70–99)
GLUCOSE-CAPILLARY: 221 mg/dL — AB (ref 70–99)
GLUCOSE-CAPILLARY: 235 mg/dL — AB (ref 70–99)
GLUCOSE-CAPILLARY: 335 mg/dL — AB (ref 70–99)
GLUCOSE-CAPILLARY: 359 mg/dL — AB (ref 70–99)
Glucose-Capillary: 107 mg/dL — ABNORMAL HIGH (ref 70–99)
Glucose-Capillary: 128 mg/dL — ABNORMAL HIGH (ref 70–99)
Glucose-Capillary: 221 mg/dL — ABNORMAL HIGH (ref 70–99)
Glucose-Capillary: 197 mg/dL — ABNORMAL HIGH (ref 70–99)
Glucose-Capillary: 78 mg/dL (ref 70–99)
Glucose-Capillary: 113 mg/dL — ABNORMAL HIGH (ref 70–99)
Glucose-Capillary: 68 mg/dL — ABNORMAL LOW (ref 70–99)
Glucose-Capillary: 118 mg/dL — ABNORMAL HIGH (ref 70–99)
Glucose-Capillary: 232 mg/dL — ABNORMAL HIGH (ref 70–99)
Glucose-Capillary: 278 mg/dL — ABNORMAL HIGH (ref 70–99)
Glucose-Capillary: 125 mg/dL — ABNORMAL HIGH (ref 70–99)

## 2018-04-01 LAB — TROPONIN I

## 2018-04-01 LAB — MAGNESIUM
MAGNESIUM: 2.5 mg/dL — AB (ref 1.7–2.4)
Magnesium: 2.3 mg/dL (ref 1.7–2.4)
Magnesium: 2.2 mg/dL (ref 1.7–2.4)
Magnesium: 2.3 mg/dL (ref 1.7–2.4)
Magnesium: 2.3 mg/dL (ref 1.7–2.4)
Magnesium: 2.1 mg/dL (ref 1.7–2.4)

## 2018-04-01 LAB — PHOSPHORUS
PHOSPHORUS: 3.2 mg/dL (ref 2.5–4.6)
PHOSPHORUS: 1.9 mg/dL — AB (ref 2.5–4.6)
Phosphorus: 3.9 mg/dL (ref 2.5–4.6)
Phosphorus: 2.9 mg/dL (ref 2.5–4.6)
Phosphorus: 2 mg/dL — ABNORMAL LOW (ref 2.5–4.6)
Phosphorus: 1.6 mg/dL — ABNORMAL LOW (ref 2.5–4.6)

## 2018-04-01 MED ORDER — DEXTROSE-NACL 5-0.45 % IV SOLN
INTRAVENOUS | Status: DC
  Administered 2018-04-01 (×2): via INTRAVENOUS

## 2018-04-01 MED ORDER — INSULIN ASPART PROT & ASPART (70-30 MIX) 100 UNIT/ML ~~LOC~~ SUSP
10.0000 [IU] | Freq: Two times a day (BID) | SUBCUTANEOUS | Status: DC
  Administered 2018-04-01: 10 [IU] via SUBCUTANEOUS
  Filled 2018-04-01: qty 10

## 2018-04-01 MED ORDER — INSULIN REGULAR BOLUS VIA INFUSION
0.0000 [IU] | Freq: Three times a day (TID) | INTRAVENOUS | Status: DC
  Filled 2018-04-01: qty 10

## 2018-04-01 MED ORDER — SODIUM CHLORIDE 0.9 % IV SOLN
INTRAVENOUS | Status: DC
  Filled 2018-04-01: qty 1

## 2018-04-01 MED ORDER — INSULIN ASPART 100 UNIT/ML ~~LOC~~ SOLN: 0.0000 [IU] | SUBCUTANEOUS | Status: DC

## 2018-04-01 MED ORDER — DEXTROSE 50 % IV SOLN
25.0000 mL | INTRAVENOUS | Status: DC | PRN
  Filled 2018-04-01: qty 50

## 2018-04-01 MED ORDER — SODIUM CHLORIDE 0.9 % IV SOLN
INTRAVENOUS | Status: DC
  Administered 2018-04-01: 11:00:00 via INTRAVENOUS

## 2018-04-01 NOTE — Progress Notes (Signed)
Inpatient Diabetes Program Recommendations  AACE/ADA: New Consensus Statement on Inpatient Glycemic Control (2015)  Target Ranges:  Prepandial:   less than 140 mg/dL      Peak postprandial:   less than 180 mg/dL (1-2 hours)      Critically ill patients:  140 - 180 mg/dL   Lab Results  Component Value Date   GLUCAP 78 04/01/2018   HGBA1C 11.4 (H) 01/25/2018    Review of Glycemic Control  Diabetes history: DM1 Outpatient Diabetes medications: 70/30 15 units bid Current orders for Inpatient glycemic control: IV insulin  HgbA1C - 11.4% - uncontrolled. This Diabetes Coordinator has seen pt 4 times in past 4-5 months regarding his diabetes care and importance of controlling blood sugars to prevent complications. Has been instructed to call [facility] and make appt and obtain PCP to manage his diabetes. Thus far, pt has not made appt and continually uses ED/Admission to hospital for his primary care.  + cocaine use (every hospital visit since 2008)   Inpatient Diabetes Program Recommendations:     Transition to 70/30 15 units bid when criteria met Novolog 0-9 units tidwc and hs  Will follow closely.  Thank you. Rhonda Vaughan, RD, LDN, CDE Inpatient Diabetes Coordinator [phone]          

## 2018-04-01 NOTE — Progress Notes (Signed)
PROGRESS NOTE    John Cox  ZOX:[id number] DOB: Jan 20, 1971 DOA: 03/31/2018 PCP: Patient, No Pcp Per     Brief Narrative:  John Cox is a 47 y.o. male with medical history significant of DM type 1, mood disorder, bipolar disorder, depression, cocaine use who was recently admitted for hyperglycemia in June 2019 who now presents with vomiting for 1 day.  He states that his vomiting episode and generalized abdominal pain started yesterday.  He has missed 1/2 days of insulin so far due to running out and not being able to afford it.  He also admits to left-sided chest pain which is difficult to describe, which started this morning.  It has been constant since the ambulance ride.  It is reproducible with palpation.  Denies any fevers, shortness of breath, cough.  Had some diarrhea starting as well. Labs revealed blood glucose 566, creatinine 1.69, CO2 18, UDS positive for cocaine, UA with ketones.  Chest x-ray without active disease.  He was started on DKA protocol and admitted for further treatment.  New events last 24 hours / Subjective: Anion gap closed around 2am and he was transitioned to long acting and SSI. This morning labs reveal elevated gap again at 18 and he was resumed on insulin gtt. He states that he continues to have headache and abdominal pain, no further nausea, vomiting.   Assessment & Plan:   Principal Problem:   DKA, type 1 (HCC) Active Problems:   Type 1 diabetes mellitus (HCC)   Acute kidney injury (HCC)   Cocaine dependence with cocaine-induced mood disorder (HCC)   DKA -Resume back on insulin gtt -IVF -Replace K, Mg aggressively -Trend BMP, Mg, Phos every 4 hours -Case manager, diabetic coordinator consult  Acute kidney injury -IVF  Atypical chest pain -Troponin negative   Mood disorder -Currently not taking Prozac, cannot afford  Cocaine abuse -SW consult    DVT prophylaxis: Subq hep Code Status: Full Family Communication: No family at  bedside Disposition Plan: Pending improvement and stabilization of blood sugar, he will remain in hospital another midnight for treatment of DKA. Change status to inpatient today for continued tx in stepdown unit.    Consultants:   none  Procedures:   none  Antimicrobials:  Anti-infectives (From admission, onward)   None       Objective: Vitals:   03/31/18 2355 04/01/18 0000 04/01/18 0400 04/01/18 0800  BP:  138/72 (!) 146/68   Pulse:  84 77   Resp:  17 15   Temp: 98.2 F (36.8 C)   98 F (36.7 C)  TempSrc: Oral     SpO2:  99% 100%   Weight:      Height:        Intake/Output Summary (Last 24 hours) at 04/01/2018 0944 Last data filed at 04/01/2018 0900 Gross per 24 hour  Intake 2784.07 ml  Output 1700 ml  Net 1084.07 ml   Filed Weights   03/31/18 1453 03/31/18 1456  Weight: 73.5 kg (162 lb) 72.6 kg (160 lb)    Examination:  General exam: Appears calm and comfortable  Respiratory system: Clear to auscultation. Respiratory effort normal. Cardiovascular system: S1 & S2 heard, RRR. No JVD, murmurs, rubs, gallops or clicks. No pedal edema. Gastrointestinal system: Abdomen is nondistended, soft and TTP diffusely. No organomegaly or masses felt. Normal bowel sounds heard. Central nervous system: Alert and oriented. No focal neurological deficits. Extremities: Symmetric 5 x 5 power. Skin: No rashes, lesions or ulcers Psychiatry: Judgement and  insight appear normal. Mood & affect appropriate.   Data Reviewed: I have personally reviewed following labs and imaging studies  CBC: Recent Labs  Lab 03/31/18 1523 04/01/18 0203  WBC 8.1 7.1  HGB 15.1 13.9  HCT 46.9 41.6  MCV 91.1 86.8  PLT 342 327   Basic Metabolic Panel: Recent Labs  Lab 03/31/18 1523 03/31/18 1824 03/31/18 2200 04/01/18 0203 04/01/18 0614  NA 142 142 144 141 138  K 4.8 4.6 4.2 4.6 5.0  CL 96* 102 105 105 103  CO2 18* 16* 23 23 17*  GLUCOSE 566* 386* 156* 140* 333*  BUN 30* 30* 28* 27* 26*   CREATININE 1.69* 1.77* 1.38* 1.27* 1.38*  CALCIUM 9.9 8.9 9.0 8.9 8.8*  MG 2.4 2.6* 2.5* 2.5* 2.3  PHOS 6.0* 5.6* 4.1 3.9 3.2   GFR: Estimated Creatinine Clearance: 67.2 mL/min (A) (by C-G formula based on SCr of 1.38 mg/dL (H)). Liver Function Tests: No results for input(s): AST, ALT, ALKPHOS, BILITOT, PROT, ALBUMIN in the last 168 hours. Recent Labs  Lab 03/31/18 1824  LIPASE 31   No results for input(s): AMMONIA in the last 168 hours. Coagulation Profile: No results for input(s): INR, PROTIME in the last 168 hours. Cardiac Enzymes: Recent Labs  Lab 03/31/18 1824 04/01/18 0004 04/01/18 0614  TROPONINI <0.03 <0.03 <0.03   BNP (last 3 results) No results for input(s): PROBNP in the last 8760 hours. HbA1C: No results for input(s): HGBA1C in the last 72 hours. CBG: Recent Labs  Lab 04/01/18 0202 04/01/18 0313 04/01/18 0500 04/01/18 0751 04/01/18 0936  GLUCAP 128* 167* 235* 221* 335*   Lipid Profile: No results for input(s): CHOL, HDL, LDLCALC, TRIG, CHOLHDL, LDLDIRECT in the last 72 hours. Thyroid Function Tests: No results for input(s): TSH, T4TOTAL, FREET4, T3FREE, THYROIDAB in the last 72 hours. Anemia Panel: No results for input(s): VITAMINB12, FOLATE, FERRITIN, TIBC, IRON, RETICCTPCT in the last 72 hours. Sepsis Labs: No results for input(s): PROCALCITON, LATICACIDVEN in the last 168 hours.  Recent Results (from the past 240 hour(s))  MRSA PCR Screening     Status: None   Collection Time: 03/31/18  6:24 PM  Result Value Ref Range Status   MRSA by PCR NEGATIVE NEGATIVE Final    Comment:        The GeneXpert MRSA Assay (FDA approved for NASAL specimens only), is one component of a comprehensive MRSA colonization surveillance program. It is not intended to diagnose MRSA infection nor to guide or monitor treatment for MRSA infections. Performed at [facility], [address] [place], [place] [id number]        Radiology  Studies: Portable Chest X-ray (1 View)  Result Date: 03/31/2018 CLINICAL DATA:  47 y/o  M; 3 episodes of vomiting. EXAM: PORTABLE CHEST 1 VIEW COMPARISON:  02/22/2018 chest and abdomen radiographs. FINDINGS: Stable heart size and mediastinal contours are within normal limits. Both lungs are clear. The visualized skeletal structures are unremarkable. IMPRESSION: No active disease. Electronically Signed   By: Mitzi HansenLance  Furusawa-Stratton M.D.   On: 03/31/2018 16:43      Scheduled Meds: . heparin  5,000 Units Subcutaneous Q8H   Continuous Infusions: . sodium chloride    . dextrose 5 % and 0.45% NaCl 100 mL/hr at 04/01/18 [id number]  . insulin (NOVOLIN-R) infusion 2.8 Units/hr (04/01/18 0943)     LOS: 0 days    Time spent: 25 minutes   Noralee StainJennifer Ashar Lewinski, DO [facility] [email or web address] Password [email or web address] 04/01/2018, 9:44 AM

## 2018-04-02 DIAGNOSIS — E1010 Type 1 diabetes mellitus with ketoacidosis without coma: Principal | ICD-10-CM

## 2018-04-02 LAB — GLUCOSE, CAPILLARY
GLUCOSE-CAPILLARY: 209 mg/dL — AB (ref 70–99)
GLUCOSE-CAPILLARY: 61 mg/dL — AB (ref 70–99)
GLUCOSE-CAPILLARY: 64 mg/dL — AB (ref 70–99)
Glucose-Capillary: 107 mg/dL — ABNORMAL HIGH (ref 70–99)
Glucose-Capillary: 187 mg/dL — ABNORMAL HIGH (ref 70–99)
Glucose-Capillary: 229 mg/dL — ABNORMAL HIGH (ref 70–99)
Glucose-Capillary: 31 mg/dL — CL (ref 70–99)
Glucose-Capillary: 71 mg/dL (ref 70–99)
Glucose-Capillary: 85 mg/dL (ref 70–99)
Glucose-Capillary: 92 mg/dL (ref 70–99)

## 2018-04-02 LAB — BASIC METABOLIC PANEL
Anion gap: 7 (ref 5–15)
BUN: 12 mg/dL (ref 6–20)
CALCIUM: 8.4 mg/dL — AB (ref 8.9–10.3)
CO2: 27 mmol/L (ref 22–32)
CREATININE: 0.97 mg/dL (ref 0.61–1.24)
Chloride: 106 mmol/L (ref 98–111)
GFR calc non Af Amer: >60 mL/min (ref >60)
Glucose, Bld: 95 mg/dL (ref 70–99)
Potassium: 3.3 mmol/L — ABNORMAL LOW (ref 3.5–5.1)
SODIUM: 140 mmol/L (ref 135–145)

## 2018-04-02 LAB — CBC
HCT: 36.6 % — ABNORMAL LOW (ref 39.0–52.0)
Hemoglobin: 12.4 g/dL — ABNORMAL LOW (ref 13.0–17.0)
MCH: 29.2 pg (ref 26.0–34.0)
MCHC: 33.9 g/dL (ref 30.0–36.0)
MCV: 86.3 fL (ref 78.0–100.0)
Platelets: 263 10*3/uL (ref 150–400)
RBC: 4.24 MIL/uL (ref 4.22–5.81)
RDW: 14.8 % (ref 11.5–15.5)
WBC: 3.7 10*3/uL — ABNORMAL LOW (ref 4.0–10.5)

## 2018-04-02 LAB — PHOSPHORUS: PHOSPHORUS: 2.7 mg/dL (ref 2.5–4.6)

## 2018-04-02 LAB — MAGNESIUM: MAGNESIUM: 2.1 mg/dL (ref 1.7–2.4)

## 2018-04-02 MED ORDER — IBUPROFEN 800 MG PO TABS
800.0000 mg | ORAL_TABLET | Freq: Four times a day (QID) | ORAL | Status: DC | PRN
  Administered 2018-04-02: 800 mg via ORAL
  Filled 2018-04-02: qty 1

## 2018-04-02 MED ORDER — INSULIN ASPART 100 UNIT/ML ~~LOC~~ SOLN: 0.0000 [IU] | Freq: Three times a day (TID) | SUBCUTANEOUS | 0 refills | Status: DC

## 2018-04-02 MED ORDER — BLOOD GLUCOSE METER KIT: PACK | 0 refills | Status: DC

## 2018-04-02 MED ORDER — INSULIN ASPART 100 UNIT/ML ~~LOC~~ SOLN
0.0000 [IU] | Freq: Three times a day (TID) | SUBCUTANEOUS | Status: DC
  Administered 2018-04-02: 3 [IU] via SUBCUTANEOUS

## 2018-04-02 MED ORDER — FLUOXETINE HCL 10 MG PO CAPS: 10.0000 mg | ORAL_CAPSULE | Freq: Every day | ORAL | 0 refills | Status: DC

## 2018-04-02 MED ORDER — OMEPRAZOLE 20 MG PO CPDR: 20.0000 mg | DELAYED_RELEASE_CAPSULE | Freq: Every day | ORAL | 0 refills | Status: DC

## 2018-04-02 MED ORDER — "INSULIN SYRINGE-NEEDLE U-100 29G X 1/2"" 0.3 ML MISC": 0 refills | Status: DC

## 2018-04-02 MED ORDER — INSULIN ASPART 100 UNIT/ML ~~LOC~~ SOLN: 0.0000 [IU] | Freq: Every day | SUBCUTANEOUS | Status: DC

## 2018-04-02 MED ORDER — POTASSIUM CHLORIDE CRYS ER 20 MEQ PO TBCR
40.0000 meq | EXTENDED_RELEASE_TABLET | Freq: Once | ORAL | Status: AC
  Administered 2018-04-02: 40 meq via ORAL
  Filled 2018-04-02: qty 2

## 2018-04-02 MED ORDER — INSULIN ASPART PROT & ASPART (70-30 MIX) 100 UNIT/ML ~~LOC~~ SUSP: 15.0000 [IU] | Freq: Two times a day (BID) | SUBCUTANEOUS | 0 refills | Status: DC

## 2018-04-02 MED ORDER — INSULIN ASPART 100 UNIT/ML ~~LOC~~ SOLN: 0.0000 [IU] | SUBCUTANEOUS | Status: DC

## 2018-04-02 MED ORDER — INSULIN DETEMIR 100 UNIT/ML ~~LOC~~ SOLN
15.0000 [IU] | Freq: Every day | SUBCUTANEOUS | Status: DC
  Administered 2018-04-02: 15 [IU] via SUBCUTANEOUS
  Filled 2018-04-02: qty 0.15

## 2018-04-02 MED ORDER — INSULIN ASPART 100 UNIT/ML ~~LOC~~ SOLN: 0.0000 [IU] | Freq: Every day | SUBCUTANEOUS | 0 refills | Status: DC

## 2018-04-02 MED ORDER — SUCRALFATE 1 G PO TABS: 1.0000 g | ORAL_TABLET | Freq: Three times a day (TID) | ORAL | 0 refills | Status: DC

## 2018-04-02 NOTE — Discharge Summary (Signed)
Physician Discharge Summary  John Cox:[id number] DOB: [birth year] DOA: 03/31/2018  PCP: Patient, No Pcp Per  Admit date: 03/31/2018 Discharge date: 04/02/2018  Admitted From: Home Disposition:  Home  Recommendations for Outpatient Follow-up:  1. Follow up with PCP in 1 week, establish with [facility] to obtain PCP  Discharge Condition: Stable, improved CODE STATUS: Full  Diet recommendation: Carb modified   Brief/Interim Summary: John Cox a 46 y.o.malewith medical history significant ofDM type 1, mood disorder, bipolar disorder, depression, cocaine use who was recently admitted for hyperglycemia in June 2019 who now presents withvomiting for 1 day. He states that his vomiting episode and generalized abdominal pain started yesterday. He has missed 1/2 days of insulin so far due to running out and not being able to afford it. He also admits to left-sided chest pain which is difficult to describe, which started this morning. It has been constant since the ambulance ride. It is reproducible with palpation. Denies any fevers, shortness of breath, cough.Had some diarrhea starting as well. Labs revealed blood glucose 566, creatinine 1.69, CO2 18,UDS positive for cocaine, UA with ketones. Chest x-ray without active disease.He was started on DKA protocol and admitted for further treatment. He was treated with insulin gtt with eventual improvement in acidosis and hyperglycemia. He is discharged home in stable condition on novolog 70/30.   Discharge Diagnoses:  Principal Problem:   DKA, type 1 ([place]) Active Problems:   Type 1 diabetes mellitus (HCC)   Acute kidney injury ([place])   Cocaine dependence with cocaine-induced mood disorder ([place])   DKA -Resolved -Appreciate DM coordinator: "This Diabetes Coordinator has seen pt 4 times in past 4-5 months regarding his diabetes care and importance of controlling blood sugars to prevent complications. Has been  instructed to call [facility] and make appt and obtain PCP to manage his diabetes. Thus far, pt has not made appt and continually uses ED/Admission to hospital for his primary care." -Will discharge on novolog 70/30 15u BID and Novolog 0-9u TIDWC and HS  Acute kidney injury -Resolved   Atypical chest pain -Troponin negative  -Resolved   Mood disorder -Currently not taking Prozac, cannot afford - CM consulted   Cocaine abuse -SW consult   Discharge Instructions  Discharge Instructions    Call MD for:   Complete by:  As directed    Persistent blood sugar > 250   Call MD for:  difficulty breathing, headache or visual disturbances   Complete by:  As directed    Call MD for:  extreme fatigue   Complete by:  As directed    Call MD for:  hives   Complete by:  As directed    Call MD for:  persistant dizziness or light-headedness   Complete by:  As directed    Call MD for:  persistant nausea and vomiting   Complete by:  As directed    Call MD for:  severe uncontrolled pain   Complete by:  As directed    Call MD for:  temperature >100.4   Complete by:  As directed    Diet Carb Modified   Complete by:  As directed    Discharge instructions   Complete by:  As directed    You were cared for by a hospitalist during your hospital stay. If you have any questions about your discharge medications or the care you received while you were in the hospital after you are discharged, you can call the unit and ask to  speak with the hospitalist on call if the hospitalist that took care of you is not available. Once you are discharged, your primary care physician will handle any further medical issues. Please note that NO REFILLS for any discharge medications will be authorized once you are discharged, as it is imperative that you return to your primary care physician (or establish a relationship with a primary care physician if you do not have one) for your aftercare needs so that they can reassess your  need for medications and monitor your lab values.   Increase activity slowly   Complete by:  As directed      Allergies as of 04/02/2018   No Known Allergies     Medication List    TAKE these medications   blood glucose meter kit and supplies Dispense based on patient and insurance preference. Use up to four times daily as directed. (FOR ICD-10 E10.9, E11.9).   FLUoxetine 10 MG capsule Commonly known as:  PROZAC Take 1 capsule (10 mg total) by mouth daily.   insulin aspart 100 UNIT/ML injection Commonly known as:  novoLOG Inject 0-5 Units into the skin at bedtime.   insulin aspart 100 UNIT/ML injection Commonly known as:  novoLOG Inject 0-9 Units into the skin 3 (three) times daily with meals.   insulin aspart protamine- aspart (70-30) 100 UNIT/ML injection Commonly known as:  NOVOLOG MIX 70/30 Inject 0.15 mLs (15 Units total) into the skin 2 (two) times daily with a meal.   Insulin Syringe-Needle U-100 29G X 1/2" 0.3 ML Misc Commonly known as:  SAFETY-GLIDE 0.3CC SYR 29GX1/2 Use as directed.   omeprazole 20 MG capsule Commonly known as:  PRILOSEC Take 1 capsule (20 mg total) by mouth daily.   sucralfate 1 g tablet Commonly known as:  CARAFATE Take 1 tablet (1 g total) by mouth 4 (four) times daily -  with meals and at bedtime.      Follow-up Information    [place]. Schedule an appointment as soon as possible for a visit in 1 week(s).   Why:  You must establish with primary care physician as soon as possible after discharge for your diabetes care.  Contact information: [place] [phone]         No Known Allergies  Consultations:  None   Procedures/Studies: Portable Chest X-ray (1 View)  Result Date: 03/31/2018 CLINICAL DATA:  47 y/o  M; 3 episodes of vomiting. EXAM: PORTABLE CHEST 1 VIEW COMPARISON:  02/22/2018 chest and abdomen radiographs. FINDINGS: Stable heart size  and mediastinal contours are within normal limits. Both lungs are clear. The visualized skeletal structures are unremarkable. IMPRESSION: No active disease. Electronically Signed   By: Kristine Garbe M.D.   On: 03/31/2018 16:43      Discharge Exam: Vitals:   04/02/18 0326 04/02/18 0336  BP:  108/63  Pulse:  70  Resp:  17  Temp: 97.9 F (36.6 C)   SpO2:  98%    General: Pt is alert, awake, not in acute distress Cardiovascular: RRR, S1/S2 +, no rubs, no gallops Respiratory: CTA bilaterally, no wheezing, no rhonchi Abdominal: Soft, NT, ND, bowel sounds + Extremities: no edema, no cyanosis    The results of significant diagnostics from this hospitalization (including imaging, microbiology, ancillary and laboratory) are listed below for reference.     Microbiology: Recent Results (from the past 240 hour(s))  MRSA PCR Screening     Status: None  Collection Time: 03/31/18  6:24 PM  Result Value Ref Range Status   MRSA by PCR NEGATIVE NEGATIVE Final    Comment:        The GeneXpert MRSA Assay (FDA approved for NASAL specimens only), is one component of a comprehensive MRSA colonization surveillance program. It is not intended to diagnose MRSA infection nor to guide or monitor treatment for MRSA infections. Performed at [facility] [place] [address] [place], [place] [address]      Labs: BNP (last 3 results) No results for input(s): BNP in the last 8760 hours. Basic Metabolic Panel: Recent Labs  Lab 04/01/18 0958 04/01/18 1349 04/01/18 1751 04/01/18 2215 04/02/18 0212  NA 136 137 142 137 140  K 4.8 3.9 3.3* 3.4* 3.3*  CL 100 103 102 104 106  CO2 23 27 29 25 27   GLUCOSE 361* 245* 89 166* 95  BUN 23* 20 17 13 12   CREATININE 1.52* 1.34* 1.12 0.98 0.97  CALCIUM 9.0 8.8* 9.2 8.5* 8.4*  MG 2.2 2.3 2.3 2.1 2.1  PHOS 2.9 2.0* 1.9* 1.6* 2.7   Liver Function Tests: No results for input(s): AST, ALT, ALKPHOS, BILITOT, PROT, ALBUMIN in the  last 168 hours. Recent Labs  Lab 03/31/18 1824  LIPASE 31   No results for input(s): AMMONIA in the last 168 hours. CBC: Recent Labs  Lab 03/31/18 1523 04/01/18 0203 04/02/18 0212  WBC 8.1 7.1 3.7*  HGB 15.1 13.9 12.4*  HCT 46.9 41.6 36.6*  MCV 91.1 86.8 86.3  PLT 342 327 263   Cardiac Enzymes: Recent Labs  Lab 03/31/18 1824 04/01/18 0004 04/01/18 0614  TROPONINI <0.03 <0.03 <0.03   BNP: Invalid input(s): POCBNP CBG: Recent Labs  Lab 04/02/18 0047 04/02/18 0141 04/02/18 0238 04/02/18 0337 04/02/18 0742  GLUCAP 107* 92 85 71 64*   D-Dimer No results for input(s): DDIMER in the last 72 hours. Hgb A1c No results for input(s): HGBA1C in the last 72 hours. Lipid Profile No results for input(s): CHOL, HDL, LDLCALC, TRIG, CHOLHDL, LDLDIRECT in the last 72 hours. Thyroid function studies No results for input(s): TSH, T4TOTAL, T3FREE, THYROIDAB in the last 72 hours.  Invalid input(s): FREET3 Anemia work up No results for input(s): VITAMINB12, FOLATE, FERRITIN, TIBC, IRON, RETICCTPCT in the last 72 hours. Urinalysis    Component Value Date/Time   COLORURINE STRAW (A) 03/31/2018 1544   APPEARANCEUR CLEAR 03/31/2018 1544   LABSPEC 1.023 03/31/2018 1544   PHURINE 5.0 03/31/2018 1544   GLUCOSEU >=500 (A) 03/31/2018 1544   HGBUR SMALL (A) 03/31/2018 1544   HGBUR negative 05/12/2007 1355   BILIRUBINUR NEGATIVE 03/31/2018 1544   BILIRUBINUR negative 05/23/2014 1052   KETONESUR 80 (A) 03/31/2018 1544   PROTEINUR NEGATIVE 03/31/2018 1544   UROBILINOGEN 0.2 10/04/2014 1749   NITRITE NEGATIVE 03/31/2018 1544   LEUKOCYTESUR NEGATIVE 03/31/2018 1544   Sepsis Labs Invalid input(s): PROCALCITONIN,  WBC,  LACTICIDVEN Microbiology Recent Results (from the past 240 hour(s))  MRSA PCR Screening     Status: None   Collection Time: 03/31/18  6:24 PM  Result Value Ref Range Status   MRSA by PCR NEGATIVE NEGATIVE Final    Comment:        The GeneXpert MRSA Assay  (FDA approved for NASAL specimens only), is one component of a comprehensive MRSA colonization surveillance program. It is not intended to diagnose MRSA infection nor to guide or monitor treatment for MRSA infections. Performed at [facility], [name] [place], [place]  [address]      Patient was seen and examined on the day of discharge and was found to be in stable condition. Time coordinating discharge: 25 minutes including assessment and coordination of care, as well as examination of the patient.   SIGNED:  Dessa Phi, DO [facility] Pager [phone]  If 7PM-7AM, please contact night-coverage [email or web address] Password [facility] 04/02/2018, 8:33 AM

## 2018-04-02 NOTE — Discharge Instructions (Signed)
·   Check your blood sugar 4 times daily: before breakfast, before lunch, before dinner, before sleep. You may also check throughout the day if you feel that your blood sugar is too low or too high.   Use Novolog Mix 70/30, 15 units twice daily.    Novolog is your short-acting correction insulin. Check your blood sugar prior to your meal, three times daily. Then depending on your blood sugar, inject insulin as follows: BG 121 - 150: 1 units  BG 151 - 200: 2 units  BG 201 - 250: 3 units  BG 251 - 300: 5 units  BG 301 - 350: 7 units  BG 351 - 400: 9 units   Check your blood sugar prior to bedtime. Use the below sliding scale for prior to bedtime:  CBG 70 - 120: 0 units  CBG 121 - 150: 0 units  CBG 151 - 200: 0 units  CBG 201 - 250: 2 units  CBG 251 - 300: 3 units  CBG 301 - 350: 4 units  CBG 351 - 400: 5 units   Keep a journal of your blood sugar results each day. Bring to your primary care physician.

## 2018-04-02 NOTE — Progress Notes (Signed)
Inpatient Diabetes Program Recommendations  AACE/ADA: New Consensus Statement on Inpatient Glycemic Control (2015)  Target Ranges:  Prepandial:   less than 140 mg/dL      Peak postprandial:   less than 180 mg/dL (1-2 hours)      Critically ill patients:  140 - 180 mg/dL   Lab Results  Component Value Date   GLUCAP 229 (H) 04/02/2018   HGBA1C 11.4 (H) 01/25/2018    Review of Glycemic Control  Spoke with pt regarding his diabetes control at home. Pt states he missed appt with [facility] last week d/t transportation issues. States he has problems getting $25 [facility]. Discussed importance of checking blood sugars and taking insulin as prescribed.   Thank you. Ailene Ardshonda Shamere Campas, RD, LDN, CDE Inpatient Diabetes Coordinator [phone]

## 2018-04-02 NOTE — Clinical Social Work Note (Signed)
Clinical Social Work Assessment  Patient Details  Name: John Cox MRN: [id number] Date of Birth: August 10, 1971  Date of referral:  04/02/18               Reason for consult:  Substance Use/ETOH Abuse                Permission sought to share information with:  Case Manager Permission granted to share information::  Yes, Verbal Permission Granted  Name::        Agency::     Relationship::     Contact Information:     Housing/Transportation Living arrangements for the past 2 months:  Single Family Home Source of Information:  Patient Patient Interpreter Needed:  None Criminal Activity/Legal Involvement Pertinent to Current Situation/Hospitalization:  No - Comment as needed Significant Relationships:  Friend, Parents, Adult Children Lives with:  Parents Do you feel safe going back to the place where you live?  Yes Need for family participation in patient care:  No (Coment)  Care giving concerns:   Substance use concerns.   Social Worker assessment / plan:  CSW met with patient at bedside, explained role and reason for visit. Patient receptive and agreeable to talk with CSW. Patient reports he has been using "smoking marijuana and drinking alcohol several times a week." Patient reports, " My girlfriend and I of thirteen years broke up and it been hard and stressful on me." Patient reports drinking helps to manage his stress. Patient reports he has been to treatment in the past for substance use. Patient reports he went to [facility] many years ago. He also stayed in [facility] and complete treatment, but left after someone stole his identity. Patient reports the longest he has been sober is 6 years.  Patient reports he is open to "getting help and talking to someone about my stress."  CSW provided patient with list of free clinics and agencies that provide mental health services. CSW also provided a list of residential and outpatient facilities that assist with SA. CSW offered to make  and appointment. Patient preferred to make his own appointment.   Plan: Patient will follow up with Mental Health clinic and SA agency of his choice.   Employment status:  Unemployed Forensic scientist:  Self Pay (Medicaid Pending) PT Recommendations:  Not assessed at this time Information / Referral to community resources:  Residential Substance Abuse Treatment Options, Outpatient Substance Abuse Treatment Options  Patient/Family's Response to care:  Agreeable and Responding well to care.   Patient/Family's Understanding of and Emotional Response to Diagnosis, Current Treatment, and Prognosis:  Patient has a understanding of his diagnosis and effect substance use has on his health.   Emotional Assessment Appearance:  Appears stated age Attitude/Demeanor/Rapport:    Affect (typically observed):  Accepting, Pleasant Orientation:  Oriented to Self, Oriented to Place, Oriented to  Time, Oriented to Situation Alcohol / Substance use:  Alcohol Use, Illicit Drugs Psych involvement (Current and /or in the community):  No (Comment)  Discharge Needs  Concerns to be addressed:  Substance Abuse Concerns Readmission within the last 30 days:  No Current discharge risk:  Dependent with Mobility Barriers to Discharge:  No Barriers Identified   Lia Hopping, LCSW 04/02/2018, 4:49 PM

## 2018-04-05 MED FILL — NOVOLOG MIX 70/30 VIAL: (70-30) 100 | 28 days supply | Qty: 10 | Fill #0

## 2018-04-05 MED FILL — OMEPRAZOLE 20 MG CAP: 20 | 30 days supply | Qty: 30 | Fill #0

## 2018-04-12 ENCOUNTER — Inpatient Hospital Stay (INDEPENDENT_AMBULATORY_CARE_PROVIDER_SITE_OTHER): Payer: Self-pay | Admitting: Physician Assistant

## 2018-05-14 ENCOUNTER — Emergency Department (HOSPITAL_COMMUNITY)
Admission: EM | Admit: 2018-05-14 | Discharge: 2018-05-14 | Disposition: A | Discharge status: Home / Self Care | Payer: Self-pay | Attending: Emergency Medicine | Admitting: Emergency Medicine

## 2018-05-14 ENCOUNTER — Encounter (HOSPITAL_COMMUNITY): Payer: Self-pay | Admitting: Emergency Medicine

## 2018-05-14 DIAGNOSIS — F17210 Nicotine dependence, cigarettes, uncomplicated: Secondary | ICD-10-CM | POA: Insufficient documentation

## 2018-05-14 DIAGNOSIS — Z79899 Other long term (current) drug therapy: Secondary | ICD-10-CM | POA: Insufficient documentation

## 2018-05-14 DIAGNOSIS — Z794 Long term (current) use of insulin: Secondary | ICD-10-CM | POA: Insufficient documentation

## 2018-05-14 DIAGNOSIS — E1065 Type 1 diabetes mellitus with hyperglycemia: Secondary | ICD-10-CM | POA: Insufficient documentation

## 2018-05-14 DIAGNOSIS — E1010 Type 1 diabetes mellitus with ketoacidosis without coma: Secondary | ICD-10-CM | POA: Insufficient documentation

## 2018-05-14 DIAGNOSIS — M7918 Myalgia, other site: Secondary | ICD-10-CM | POA: Insufficient documentation

## 2018-05-14 MED ORDER — METHOCARBAMOL 500 MG PO TABS: 500.0000 mg | ORAL_TABLET | Freq: Two times a day (BID) | ORAL | 0 refills | Status: DC
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
## 2018-05-14 NOTE — Discharge Instructions (Addendum)
You may alternate taking Tylenol and Ibuprofen as needed for pain control. You may take 400-600 mg of ibuprofen every 6 hours and 500-1000 mg of Tylenol every 6 hours. Do not exceed 4000 mg of Tylenol daily as this can lead to liver damage. Also, make sure to take Ibuprofen with meals as it can cause an upset stomach. Do not take other NSAIDs while taking Ibuprofen such as (Aleve, Naprosyn, Aspirin, Celebrex, etc) and do not take more than the prescribed dose as this can lead to ulcers and bleeding in your GI tract. You may use warm and cold compresses to help with your symptoms.   You were given a prescription for Robaxin which is a muscle relaxer.  You should not drive, work, or operate machinery while taking this medication as it can make you very drowsy.    Please follow up with your primary doctor within the next 7-10 days for re-evaluation and further treatment of your symptoms.   Please return to the ER sooner if you have any new or worsening symptoms.  
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
## 2018-05-14 NOTE — ED Notes (Signed)
Patient verbalizes understanding of discharge instructions. Opportunity for questioning and answers were provided. Armband removed by staff, pt discharged from ED. Pt ambulatory at discharge. Pt requesting buss pass, but notified he will have to check at desk if any available.

## 2018-05-24 ENCOUNTER — Inpatient Hospital Stay (HOSPITAL_COMMUNITY)
Admission: EM | Admit: 2018-05-24 | Discharge: 2018-05-27 | DRG: 638 | Payer: Self-pay | Attending: Internal Medicine | Admitting: Internal Medicine

## 2018-05-24 ENCOUNTER — Emergency Department (HOSPITAL_COMMUNITY): Payer: Self-pay

## 2018-05-24 ENCOUNTER — Other Ambulatory Visit: Payer: Self-pay

## 2018-05-24 DIAGNOSIS — F339 Major depressive disorder, recurrent, unspecified: Secondary | ICD-10-CM | POA: Diagnosis present

## 2018-05-24 DIAGNOSIS — E162 Hypoglycemia, unspecified: Secondary | ICD-10-CM | POA: Diagnosis present

## 2018-05-24 DIAGNOSIS — M79604 Pain in right leg: Secondary | ICD-10-CM | POA: Diagnosis present

## 2018-05-24 DIAGNOSIS — F1910 Other psychoactive substance abuse, uncomplicated: Secondary | ICD-10-CM | POA: Diagnosis present

## 2018-05-24 DIAGNOSIS — Z915 Personal history of self-harm: Secondary | ICD-10-CM

## 2018-05-24 DIAGNOSIS — F419 Anxiety disorder, unspecified: Secondary | ICD-10-CM | POA: Diagnosis present

## 2018-05-24 DIAGNOSIS — Z833 Family history of diabetes mellitus: Secondary | ICD-10-CM

## 2018-05-24 DIAGNOSIS — Z794 Long term (current) use of insulin: Secondary | ICD-10-CM

## 2018-05-24 DIAGNOSIS — Z8249 Family history of ischemic heart disease and other diseases of the circulatory system: Secondary | ICD-10-CM

## 2018-05-24 DIAGNOSIS — R45851 Suicidal ideations: Secondary | ICD-10-CM | POA: Diagnosis present

## 2018-05-24 DIAGNOSIS — F1490 Cocaine use, unspecified, uncomplicated: Secondary | ICD-10-CM | POA: Diagnosis present

## 2018-05-24 DIAGNOSIS — I495 Sick sinus syndrome: Secondary | ICD-10-CM | POA: Diagnosis present

## 2018-05-24 DIAGNOSIS — Z825 Family history of asthma and other chronic lower respiratory diseases: Secondary | ICD-10-CM

## 2018-05-24 DIAGNOSIS — E860 Dehydration: Secondary | ICD-10-CM | POA: Diagnosis present

## 2018-05-24 DIAGNOSIS — E108 Type 1 diabetes mellitus with unspecified complications: Secondary | ICD-10-CM

## 2018-05-24 DIAGNOSIS — E10649 Type 1 diabetes mellitus with hypoglycemia without coma: Principal | ICD-10-CM | POA: Diagnosis present

## 2018-05-24 DIAGNOSIS — F314 Bipolar disorder, current episode depressed, severe, without psychotic features: Secondary | ICD-10-CM | POA: Diagnosis present

## 2018-05-24 DIAGNOSIS — F332 Major depressive disorder, recurrent severe without psychotic features: Secondary | ICD-10-CM | POA: Diagnosis present

## 2018-05-24 DIAGNOSIS — N179 Acute kidney failure, unspecified: Secondary | ICD-10-CM | POA: Diagnosis present

## 2018-05-24 DIAGNOSIS — M79605 Pain in left leg: Secondary | ICD-10-CM | POA: Diagnosis present

## 2018-05-24 DIAGNOSIS — F1290 Cannabis use, unspecified, uncomplicated: Secondary | ICD-10-CM | POA: Diagnosis present

## 2018-05-24 DIAGNOSIS — F17200 Nicotine dependence, unspecified, uncomplicated: Secondary | ICD-10-CM | POA: Diagnosis present

## 2018-05-24 DIAGNOSIS — D72819 Decreased white blood cell count, unspecified: Secondary | ICD-10-CM | POA: Diagnosis present

## 2018-05-24 DIAGNOSIS — E109 Type 1 diabetes mellitus without complications: Secondary | ICD-10-CM | POA: Diagnosis present

## 2018-05-24 LAB — GLUCOSE, CAPILLARY
GLUCOSE-CAPILLARY: 284 mg/dL — AB (ref 70–99)
Glucose-Capillary: 220 mg/dL — ABNORMAL HIGH (ref 70–99)

## 2018-05-24 LAB — COMPREHENSIVE METABOLIC PANEL
ALK PHOS: 72 U/L (ref 38–126)
ALT: 25 U/L (ref 0–44)
AST: 34 U/L (ref 15–41)
Albumin: 4.3 g/dL (ref 3.5–5.0)
Anion gap: 9 (ref 5–15)
BUN: 24 mg/dL — AB (ref 6–20)
CALCIUM: 9.4 mg/dL (ref 8.9–10.3)
CHLORIDE: 102 mmol/L (ref 98–111)
CO2: 31 mmol/L (ref 22–32)
CREATININE: 1.4 mg/dL — AB (ref 0.61–1.24)
GFR calc Af Amer: >60 mL/min (ref >60)
GFR calc non Af Amer: 58 mL/min — ABNORMAL LOW (ref >60)
Glucose, Bld: 98 mg/dL (ref 70–99)
Potassium: 3.5 mmol/L (ref 3.5–5.1)
SODIUM: 142 mmol/L (ref 135–145)
Total Bilirubin: 1.3 mg/dL — ABNORMAL HIGH (ref 0.3–1.2)
Total Protein: 7.4 g/dL (ref 6.5–8.1)

## 2018-05-24 LAB — CBC WITH DIFFERENTIAL/PLATELET
BASOS ABS: 0 10*3/uL (ref 0.0–0.1)
Basophils Relative: 1 %
EOS ABS: 0.1 10*3/uL (ref 0.0–0.7)
EOS PCT: 2 %
HCT: 42 % (ref 39.0–52.0)
HEMOGLOBIN: 13.9 g/dL (ref 13.0–17.0)
LYMPHS ABS: 1.2 10*3/uL (ref 0.7–4.0)
LYMPHS PCT: 30 %
MCH: 28.4 pg (ref 26.0–34.0)
MCHC: 33.1 g/dL (ref 30.0–36.0)
MCV: 85.9 fL (ref 78.0–100.0)
Monocytes Absolute: 0.5 10*3/uL (ref 0.1–1.0)
Monocytes Relative: 13 %
NEUTROS PCT: 54 %
Neutro Abs: 2.1 10*3/uL (ref 1.7–7.7)
PLATELETS: 310 10*3/uL (ref 150–400)
RBC: 4.89 MIL/uL (ref 4.22–5.81)
RDW: 13.3 % (ref 11.5–15.5)
WBC: 3.9 10*3/uL — ABNORMAL LOW (ref 4.0–10.5)

## 2018-05-24 LAB — RAPID URINE DRUG SCREEN, HOSP PERFORMED
Amphetamines: NOT DETECTED
Barbiturates: NOT DETECTED
Benzodiazepines: NOT DETECTED
COCAINE: POSITIVE — AB
Opiates: NOT DETECTED
TETRAHYDROCANNABINOL: POSITIVE — AB

## 2018-05-24 LAB — URINALYSIS, ROUTINE W REFLEX MICROSCOPIC
BILIRUBIN URINE: NEGATIVE
Glucose, UA: 150 mg/dL — AB
HGB URINE DIPSTICK: NEGATIVE
KETONES UR: 5 mg/dL — AB
Leukocytes, UA: NEGATIVE
Nitrite: NEGATIVE
PROTEIN: 100 mg/dL — AB
SPECIFIC GRAVITY, URINE: 1.031 — AB (ref 1.005–1.030)
pH: 5 (ref 5.0–8.0)

## 2018-05-24 LAB — CBG MONITORING, ED
GLUCOSE-CAPILLARY: 157 mg/dL — AB (ref 70–99)
GLUCOSE-CAPILLARY: 59 mg/dL — AB (ref 70–99)
Glucose-Capillary: 27 mg/dL — CL (ref 70–99)
Glucose-Capillary: 156 mg/dL — ABNORMAL HIGH (ref 70–99)
Glucose-Capillary: 31 mg/dL — CL (ref 70–99)
Glucose-Capillary: 73 mg/dL (ref 70–99)
Glucose-Capillary: 216 mg/dL — ABNORMAL HIGH (ref 70–99)
Glucose-Capillary: 198 mg/dL — ABNORMAL HIGH (ref 70–99)
Glucose-Capillary: 144 mg/dL — ABNORMAL HIGH (ref 70–99)
Glucose-Capillary: 93 mg/dL (ref 70–99)

## 2018-05-24 LAB — ETHANOL

## 2018-05-24 LAB — ACETAMINOPHEN LEVEL: Acetaminophen (Tylenol), Serum: <10 ug/mL — ABNORMAL LOW (ref 10–30)

## 2018-05-24 LAB — LIPASE, BLOOD: Lipase: 28 U/L (ref 11–51)

## 2018-05-24 LAB — SALICYLATE LEVEL: Salicylate Lvl: <7 mg/dL (ref 2.8–30.0)

## 2018-05-24 LAB — TROPONIN I: Troponin I: <0.03 ng/mL

## 2018-05-24 LAB — CK: Total CK: 354 U/L (ref 49–397)

## 2018-05-24 MED ORDER — SODIUM CHLORIDE 0.9% FLUSH: 3.0000 mL | INTRAVENOUS | Status: DC | PRN

## 2018-05-24 MED ORDER — DEXTROSE 50 % IV SOLN
INTRAVENOUS | Status: AC
  Administered 2018-05-24: 50 mL via INTRAVENOUS
  Filled 2018-05-24: qty 50

## 2018-05-24 MED ORDER — ACETAMINOPHEN 325 MG PO TABS
650.0000 mg | ORAL_TABLET | Freq: Four times a day (QID) | ORAL | Status: DC | PRN
  Administered 2018-05-24 – 2018-05-27 (×3): 650 mg via ORAL
  Filled 2018-05-24 (×3): qty 2

## 2018-05-24 MED ORDER — ONDANSETRON HCL 4 MG PO TABS: 4.0000 mg | ORAL_TABLET | Freq: Four times a day (QID) | ORAL | Status: DC | PRN

## 2018-05-24 MED ORDER — GLUCAGON HCL RDNA (DIAGNOSTIC) 1 MG IJ SOLR
1.0000 mg | Freq: Once | INTRAMUSCULAR | Status: AC
  Administered 2018-05-24: 1 mg via INTRAVENOUS
  Filled 2018-05-24: qty 1

## 2018-05-24 MED ORDER — DEXTROSE 50 % IV SOLN
50.0000 mL | Freq: Once | INTRAVENOUS | Status: AC
  Administered 2018-05-24: 50 mL via INTRAVENOUS

## 2018-05-24 MED ORDER — ENOXAPARIN SODIUM 40 MG/0.4ML ~~LOC~~ SOLN
40.0000 mg | SUBCUTANEOUS | Status: DC
  Administered 2018-05-24 – 2018-05-26 (×3): 40 mg via SUBCUTANEOUS
  Filled 2018-05-24 (×3): qty 0.4

## 2018-05-24 MED ORDER — SODIUM CHLORIDE 0.9 % IV SOLN: 250.0000 mL | INTRAVENOUS | Status: DC | PRN

## 2018-05-24 MED ORDER — ACETAMINOPHEN 650 MG RE SUPP: 650.0000 mg | Freq: Four times a day (QID) | RECTAL | Status: DC | PRN

## 2018-05-24 MED ORDER — SODIUM CHLORIDE 0.9% FLUSH
3.0000 mL | Freq: Two times a day (BID) | INTRAVENOUS | Status: DC
  Administered 2018-05-24 – 2018-05-26 (×4): 3 mL via INTRAVENOUS

## 2018-05-24 MED ORDER — ONDANSETRON HCL 4 MG/2ML IJ SOLN: 4.0000 mg | Freq: Four times a day (QID) | INTRAMUSCULAR | Status: DC | PRN

## 2018-05-24 MED ORDER — DEXTROSE 10 % IV SOLN
100.0000 mL | INTRAVENOUS | Status: DC
  Filled 2018-05-24 (×5): qty 250

## 2018-05-24 MED ORDER — DEXTROSE 10 % IV SOLN: 100.0000 mL | INTRAVENOUS | Status: DC

## 2018-05-24 MED ORDER — DEXTROSE 10 % IV SOLN
100.0000 mL | Freq: Once | INTRAVENOUS | Status: DC
  Filled 2018-05-24: qty 1000
  Filled 2018-05-24: qty 500

## 2018-05-24 MED ORDER — SODIUM CHLORIDE 0.9 % IV SOLN
INTRAVENOUS | Status: DC
  Administered 2018-05-24: 23:00:00 via INTRAVENOUS

## 2018-05-24 MED ORDER — DEXTROSE 10 % IV SOLN
100.0000 mL | Freq: Once | INTRAVENOUS | Status: AC
  Administered 2018-05-24: 100 mL via INTRAVENOUS

## 2018-05-24 MED ORDER — INSULIN ASPART 100 UNIT/ML ~~LOC~~ SOLN
0.0000 [IU] | Freq: Three times a day (TID) | SUBCUTANEOUS | Status: DC
  Administered 2018-05-25: 7 [IU] via SUBCUTANEOUS
  Administered 2018-05-25: 2 [IU] via SUBCUTANEOUS
  Administered 2018-05-25: 5 [IU] via SUBCUTANEOUS
  Administered 2018-05-26: 9 [IU] via SUBCUTANEOUS

## 2018-05-24 MED ORDER — DEXTROSE 10 % IV SOLN
INTRAVENOUS | Status: DC
  Filled 2018-05-24: qty 1000

## 2018-05-24 MED ORDER — CYCLOBENZAPRINE HCL 5 MG PO TABS
5.0000 mg | ORAL_TABLET | Freq: Three times a day (TID) | ORAL | Status: DC | PRN
  Administered 2018-05-24: 5 mg via ORAL
  Filled 2018-05-24: qty 1

## 2018-05-24 NOTE — ED Notes (Signed)
ED Provider at bedside. 

## 2018-05-24 NOTE — ED Notes (Signed)
PA aware of Pts CBG

## 2018-05-24 NOTE — ED Notes (Addendum)
PA made aware of Pts CBG

## 2018-05-24 NOTE — ED Notes (Signed)
Bed: WA04 Expected date:  Expected time:  Means of arrival:  Comments: EMS-hypoglycemia

## 2018-05-24 NOTE — ED Notes (Signed)
Per PA. Pause D10 and recheck CBG in 

## 2018-05-24 NOTE — ED Notes (Signed)
Pt not alert enough for PO intake. PA aware

## 2018-05-24 NOTE — H&P (Addendum)
History and Physical    John Cox RDE:[id number] DOB: Nov 23, 1970 DOA: 05/24/2018  Referring MD/NP/PA: Johnny Bridge, PA-C PCP: John Cox, No Pcp Per  John Cox coming from: Home via EMS  Chief Complaint: Low blood sugar  I have personally briefly reviewed John Cox's old medical records in [place]   HPI: John Cox is a 47 y.o. male with medical history significant of DM type 1, polysubstance abuse, bipolar disorder, anxiety, and depression; who presents with complaints of low blood sugars.  EMS was called after John Cox's mother with home he lives found him to be very drowsy. Upon EMS arrival initial blood glucose was noted to be 19 he was administered 25 g of D10 IV with repeat CBG 95.    He cannot recall the events that led up to him coming to the hospital today or when he last used insulin.  He admits to snorting cocaine 1 to 2 days ago and use of marijuana.  He complains of bilateral leg pains and feeling depressed.  He intermittently feels depressed and this is when he thinks about trying to hurt himself by doing drugs.  He admits to having suicidal ideations.  He currently is not on any medication for treatment of his mood disorder.  Denies having any recent trauma,  falls, chest pain, shortness of breath, abdominal pain, nausea, vomiting, diarrhea, dysuria, or fevers.  He may check his blood sugars every other day or so.  ED Course: Upon admission into the emergency department John Cox was seen to have pulse 57-73, and all other vital signs within normal limits.  Initial labs are WBC 3.9, BUN 24, and creatinine 1.4.  John Cox was given 50 ml of dextrose 50%, given 1 mg of glucagon, and placed on the D10 infusion at 200 mL/h.  TRH called due to repeated episodes of hypoglycemia.   Review of Systems  Constitutional: Negative for chills and fever.  HENT: Negative for congestion and ear discharge.   Eyes: Negative for pain and discharge.  Respiratory: Negative for cough,  sputum production and shortness of breath.   Cardiovascular: Negative for chest pain and leg swelling.  Gastrointestinal: Negative for abdominal pain, nausea and vomiting.  Genitourinary: Negative for dysuria and frequency.  Musculoskeletal: Positive for myalgias. Negative for falls.  Skin: Negative for itching and rash.  Neurological: Negative for focal weakness and loss of consciousness.  Psychiatric/Behavioral: Positive for depression, substance abuse and suicidal ideas.    Past Medical History:  Diagnosis Date  . AKI (acute kidney injury) ([place]) 01/25/2018  . Anxiety   . Bipolar disorder ([place])   . Depression 2000  . Diabetes mellitus 1995  . Hyperglycemia 01/25/2018  . Nausea & vomiting   . Polysubstance abuse ([place]) 2012   relapse 2 wk ago   . Suicidal ideation     Past Surgical History:  Procedure Laterality Date  . NO PAST SURGERIES       reports that he has been smoking cigarettes. He has a 25.00 pack-year smoking history. He has never used smokeless tobacco. He reports that he drinks about 1.0 standard drinks of alcohol per week. He reports that he has current or past drug history. Drugs: Marijuana and Cocaine.  No Known Allergies  Family History  Problem Relation Age of Onset  . Diabetes Mother   . COPD Mother   . Heart disease Mother     Prior to Admission medications   Medication Sig Start Date End Date Taking? Authorizing Provider  blood glucose meter kit  and supplies Dispense based on John Cox and insurance preference. Use up to four times daily as directed. (FOR ICD-10 E10.9, E11.9). 04/02/18   Dessa Phi, DO  FLUoxetine (PROZAC) 10 MG capsule Take 1 capsule (10 mg total) by mouth daily. 04/02/18   Dessa Phi, DO  insulin aspart (NOVOLOG) 100 UNIT/ML injection Inject 0-5 Units into the skin at bedtime. 04/02/18   Dessa Phi, DO  insulin aspart (NOVOLOG) 100 UNIT/ML injection Inject 0-9 Units into the skin 3 (three) times daily with meals. 04/02/18   Dessa Phi, DO  insulin aspart protamine- aspart (NOVOLOG MIX 70/30) (70-30) 100 UNIT/ML injection Inject 0.15 mLs (15 Units total) into the skin 2 (two) times daily with a meal. 04/02/18   Dessa Phi, DO  Insulin Syringe-Needle U-100 (SAFETY-GLIDE 0.3CC SYR 29GX1/2) 29G X 1/2" 0.3 ML MISC Use as directed. 04/02/18   Dessa Phi, DO  methocarbamol (ROBAXIN) 500 MG tablet Take 1 tablet (500 mg total) by mouth 2 (two) times daily. 05/14/18   Couture, Cortni S, PA-C  omeprazole (PRILOSEC) 20 MG capsule Take 1 capsule (20 mg total) by mouth daily. 04/02/18   Dessa Phi, DO  sucralfate (CARAFATE) 1 g tablet Take 1 tablet (1 g total) by mouth 4 (four) times daily -  with meals and at bedtime. 04/02/18 05/02/18  Dessa Phi, DO    Physical Exam:  Constitutional: Middle-aged male who appears NAD, calm, comfortable Vitals:   05/24/18 1745 05/24/18 1800 05/24/18 1900 05/24/18 1930  BP:  125/81 124/83 122/84  Pulse:   73 71  Resp: 17 14 11 11   TempSrc:      SpO2:   100% 100%  Weight:       Eyes: PERRL, lids and conjunctivae normal ENMT: Mucous membranes are moist. Posterior pharynx clear of any exudate or lesions.   Neck: normal, supple, no masses, no thyromegaly Respiratory: clear to auscultation bilaterally, no wheezing, no crackles. Normal respiratory effort. No accessory muscle use.  Cardiovascular: Regular rate and rhythm, no murmurs / rubs / gallops. No extremity edema. 2+ pedal pulses. No carotid bruits.  Abdomen: no tenderness, no masses palpated. No hepatosplenomegaly. Bowel sounds positive.  Musculoskeletal: no clubbing / cyanosis. No joint deformity upper and lower extremities. Good ROM, no contractures. Normal muscle tone.  Tenderness to palpation of the lower extremities.   Skin: no rashes, lesions, ulcers. No induration Neurologic: CN 2-12 grossly intact. Sensation intact, DTR normal. Strength 5/5 in all 4.  Psychiatric: Poor judgment and insight. Alert and oriented x 3.  Depressed  mood.    Labs on Admission: I have personally reviewed following labs and imaging studies  CBC: Recent Labs  Lab 05/24/18 1408  WBC 3.9*  NEUTROABS 2.1  HGB 13.9  HCT 42.0  MCV 85.9  PLT 585   Basic Metabolic Panel: Recent Labs  Lab 05/24/18 1408  NA 142  K 3.5  CL 102  CO2 31  GLUCOSE 98  BUN 24*  CREATININE 1.40*  CALCIUM 9.4   GFR: Estimated Creatinine Clearance: 66.3 mL/min (A) (by C-G formula based on SCr of 1.4 mg/dL (H)). Liver Function Tests: Recent Labs  Lab 05/24/18 1408  AST 34  ALT 25  ALKPHOS 72  BILITOT 1.3*  PROT 7.4  ALBUMIN 4.3   Recent Labs  Lab 05/24/18 1408  LIPASE 28   No results for input(s): AMMONIA in the last 168 hours. Coagulation Profile: No results for input(s): INR, PROTIME in the last 168 hours. Cardiac Enzymes: Recent Labs  Lab 05/24/18  [place] <0.03   BNP (last 3 results) No results for input(s): PROBNP in the last 8760 hours. HbA1C: No results for input(s): HGBA1C in the last 72 hours. CBG: Recent Labs  Lab 05/24/18 1611 05/24/18 1702 05/24/18 1801 05/24/18 1902 05/24/18 1938  GLUCAP 216* 198* 144* 59* 93   Lipid Profile: No results for input(s): CHOL, HDL, LDLCALC, TRIG, CHOLHDL, LDLDIRECT in the last 72 hours. Thyroid Function Tests: No results for input(s): TSH, T4TOTAL, FREET4, T3FREE, THYROIDAB in the last 72 hours. Anemia Panel: No results for input(s): VITAMINB12, FOLATE, FERRITIN, TIBC, IRON, RETICCTPCT in the last 72 hours. Urine analysis:    Component Value Date/Time   COLORURINE STRAW (A) 03/31/2018 1544   APPEARANCEUR CLEAR 03/31/2018 1544   LABSPEC 1.023 03/31/2018 1544   PHURINE 5.0 03/31/2018 1544   GLUCOSEU >=500 (A) 03/31/2018 1544   HGBUR SMALL (A) 03/31/2018 1544   HGBUR negative 05/12/2007 1355   BILIRUBINUR NEGATIVE 03/31/2018 1544   BILIRUBINUR negative 05/23/2014 1052   KETONESUR 80 (A) 03/31/2018 1544   PROTEINUR NEGATIVE 03/31/2018 1544   UROBILINOGEN 0.2  10/04/2014 1749   NITRITE NEGATIVE 03/31/2018 1544   LEUKOCYTESUR NEGATIVE 03/31/2018 1544   Sepsis Labs: No results found for this or any previous visit (from the past 240 hour(s)).   Radiological Exams on Admission: Dg Chest 2 View  Result Date: 05/24/2018 CLINICAL DATA:  Hypoglycemia.  History of substance abuse. EXAM: CHEST - 2 VIEW COMPARISON:  Chest radiograph March 31, 2018 FINDINGS: Cardiomediastinal silhouette is normal. No pleural effusions or focal consolidations. Trachea projects midline and there is no pneumothorax. Soft tissue planes and included osseous structures are non-suspicious. IMPRESSION: Negative. Electronically Signed   By: Elon Alas M.D.   On: 05/24/2018 16:59    EKG: Independently reviewed.  Sinus rhythm at 72 bpm with ST wave changes.  Assessment/Plan Hypoglycemia, diabetes mellitus type 1: Acute.  John Cox presents with blood sugar as low as 19.  He was initially hypoglycemic multiple times despite use of D50, glucagon, and D10 drip.  John Cox cannot recall when he last used insulin, but reports recent history of lows. - Admit to telemetry bed - Hypoglycemic protocol - Hold insulin - D10 infusion at 100 mL/h  - Restart insulin when medically appropriate  Acute kidney injury: John Cox's baseline creatinine previously noted to be within normal limits.  John Cox presents with a creatinine of 1.4 with BUN 24.  Elevated BUN to creatinine ratio suggest prerenal cause.  Given recent cocaine use question of possibility of rhabdomyolysis - Follow-up urinalysis - Add on CK - IV fluids as seen above - Recheck kidney function in a.m.  Depression, anxiety, bipolar disorder: John Cox currently reports feeling depressed with suicidal ideation for which he used drugs recently.  Denies any current suicidal ideations at this time.  Suicide precautions were initially placed, but John Cox actively denies any thoughts of wanting to harm himself at this time. - Consult  TTS/psychiatry in a.m. once medically stable  Polysubstance abuse: Acute on chronic.  John Cox positive for cocaine and marijuana. - Follow-up urine drug screen - Social work consult  Leukopenia: Chronic.  Initial WBC 3.9 which appears similar to previous. - Continue to monitor  DVT prophylaxis: Lovenox Code Status: Full  Family Communication: No family present at bedside Disposition Plan: TBD  Consults called: none Admission status: Observation  Norval Morton MD [facility] Pager [phone]   If 7PM-7AM, please contact night-coverage [email or web address] Password [facility]  05/24/2018, 7:46 PM

## 2018-05-24 NOTE — ED Notes (Addendum)
Attempted to get pt to eat and drink. Pt asleep and will not wake long enough to eat or drink. Pt arousable to voice. PA made aware

## 2018-05-24 NOTE — ED Notes (Signed)
ED TO INPATIENT HANDOFF REPORT  Name/Age/Gender John Cox 47 y.o. male  Code Status    Code Status Orders  (From admission, onward)         Start     Ordered   05/24/18 1959  Full code  Continuous     05/24/18 2001        Code Status History    Date Active Date Inactive Code Status Order ID Comments User Context   03/31/2018 1812 04/02/2018 1909 Full Code [id number]  Choi, Jennifer, DO Inpatient   02/22/2018 1442 02/22/2018 1913 Full Code [id number]  Davis, Laura A, NP Inpatient   01/25/2018 0105 01/26/2018 1441 Full Code [id number]  Kim, James, MD ED   11/13/2017 2146 11/15/2017 1600 Full Code [id number]  Doutova, Anastassia, MD Inpatient   07/18/2015 1802 07/19/2015 1919 Full Code [id number]  Hongalgi, Anand D, MD Inpatient   05/14/2014 2357 05/18/2014 2018 Full Code [id number]  Patel, Pranav, MD ED   05/13/2014 1244 05/14/2014 1939 Full Code [id number]  Piepenbrink, Jennifer L, PA-C ED      Home/SNF/Other Home  Chief Complaint hypoglycemia  Level of Care/Admitting Diagnosis ED Disposition    ED Disposition Condition Comment   Admit  Hospital Area: [facility] [[id number]]  Level of Care: Telemetry [5]  Admit to tele based on following criteria: Complex arrhythmia (Bradycardia/Tachycardia)  Diagnosis: Hypoglycemia [242204]  Admitting Physician: SMITH, RONDELL A [[id number]]  Attending Physician: SMITH, RONDELL A [[id number]]  PT Class (Do Not Modify): Observation [104]  PT Acc Code (Do Not Modify): Observation [10022]       Medical History Past Medical History:  Diagnosis Date  . AKI (acute kidney injury) (HCC) 01/25/2018  . Anxiety   . Bipolar disorder (HCC)   . Depression 2000  . Diabetes mellitus 1995  . Hyperglycemia 01/25/2018  . Nausea & vomiting   . Polysubstance abuse (HCC) 2012   relapse 2 wk ago   . Suicidal ideation     Allergies No Known Allergies  IV Location/Drains/Wounds Patient Lines/Drains/Airways Status   Active  Line/Drains/Airways    Name:   Placement date:   Placement time:   Site:   Days:   Peripheral IV 05/24/18 Left Antecubital   05/24/18    -    Antecubital   less than 1   Peripheral IV 05/24/18 Right Antecubital   05/24/18    1411    Antecubital   less than 1          Labs/Imaging Results for orders placed or performed during the hospital encounter of 05/24/18 (from the past 48 hour(s))  CBG monitoring, ED     Status: Abnormal   Collection Time: 05/24/18  1:49 PM  Result Value Ref Range   Glucose-Capillary 27 (LL) 70 - 99 mg/dL  CBG monitoring, ED     Status: Abnormal   Collection Time: 05/24/18  2:07 PM  Result Value Ref Range   Glucose-Capillary 157 (H) 70 - 99 mg/dL  CBC with Differential     Status: Abnormal   Collection Time: 05/24/18  2:08 PM  Result Value Ref Range   WBC 3.9 (L) 4.0 - 10.5 K/uL   RBC 4.89 4.22 - 5.81 MIL/uL   Hemoglobin 13.9 13.0 - 17.0 g/dL   HCT 42.0 39.0 - 52.0 %   MCV 85.9 78.0 - 100.0 fL   MCH 28.4 26.0 - 34.0 pg   MCHC 33.1 30.0 - 36.0 g/dL   RDW 13.3 11.5 - 15.5 %     Platelets 310 150 - 400 K/uL   Neutrophils Relative % 54 %   Neutro Abs 2.1 1.7 - 7.7 K/uL   Lymphocytes Relative 30 %   Lymphs Abs 1.2 0.7 - 4.0 K/uL   Monocytes Relative 13 %   Monocytes Absolute 0.5 0.1 - 1.0 K/uL   Eosinophils Relative 2 %   Eosinophils Absolute 0.1 0.0 - 0.7 K/uL   Basophils Relative 1 %   Basophils Absolute 0.0 0.0 - 0.1 K/uL    Comment: Performed at [facility], [address] [place], [place] [address]  Comprehensive metabolic panel     Status: Abnormal   Collection Time: 05/24/18  2:08 PM  Result Value Ref Range   Sodium 142 135 - 145 mmol/L   Potassium 3.5 3.5 - 5.1 mmol/L   Chloride 102 98 - 111 mmol/L   CO2 31 22 - 32 mmol/L   Glucose, Bld 98 70 - 99 mg/dL   BUN 24 (H) 6 - 20 mg/dL   Creatinine, Ser 1.40 (H) 0.61 - 1.24 mg/dL   Calcium 9.4 8.9 - 10.3 mg/dL   Total Protein 7.4 6.5 - 8.1 g/dL   Albumin 4.3 3.5 - 5.0 g/dL    AST 34 15 - 41 U/L   ALT 25 0 - 44 U/L   Alkaline Phosphatase 72 38 - 126 U/L   Total Bilirubin 1.3 (H) 0.3 - 1.2 mg/dL   GFR calc non Af Amer 58 (L) >60 mL/min   GFR calc Af Amer >60 >60 mL/min    Comment: (NOTE) The eGFR has been calculated using the CKD EPI equation. This calculation has not been validated in all clinical situations. eGFR's persistently <60 mL/min signify possible Chronic Kidney Disease.    Anion gap 9 5 - 15    Comment: Performed at [facility], [address] [place], [place] [address]  Lipase, blood     Status: None   Collection Time: 05/24/18  2:08 PM  Result Value Ref Range   Lipase 28 11 - 51 U/L    Comment: Performed at [facility], [address] [place], [place] [address]  Troponin I     Status: None   Collection Time: 05/24/18  2:08 PM  Result Value Ref Range   Troponin I <0.03 <0.03 ng/mL    Comment: Performed at [facility], [address] [place], [place] [address]  Ethanol     Status: None   Collection Time: 05/24/18  2:09 PM  Result Value Ref Range   Alcohol, Ethyl (B) <10 <10 mg/dL    Comment: (NOTE) Lowest detectable limit for serum alcohol is 10 mg/dL. For medical purposes only. Performed at [facility], [address] , [place] [address]   Salicylate level     Status: None   Collection Time: 05/24/18  2:09 PM  Result Value Ref Range   Salicylate Lvl <7.0 2.8 - 30.0 mg/dL    Comment: [facility], [address] ,  [address]  Acetaminophen level     Status: Abnormal   Collection Time: 05/24/18  2:09 PM  Result Value Ref Range   Acetaminophen (Tylenol), Serum <10 (L) 10 - 30 ug/mL    Comment: (NOTE) Therapeutic concentrations vary significantly. A range of 10-30 ug/mL  may be an effective concentration for many patients. However, some  are best treated at concentrations outside of this  range. Acetaminophen concentrations >150 ug/mL at 4   hours after ingestion  and >50 ug/mL at 12 hours after ingestion are often associated with  toxic reactions. Performed at [facility], [place] [address] [place], [place] [address]   CBG monitoring, ED     Status: None   Collection Time: 05/24/18  2:32 PM  Result Value Ref Range   Glucose-Capillary 73 70 - 99 mg/dL  CBG monitoring, ED     Status: Abnormal   Collection Time: 05/24/18  2:59 PM  Result Value Ref Range   Glucose-Capillary 31 (LL) 70 - 99 mg/dL   Comment 1 Notify RN    Comment 2 Document in Chart   CBG monitoring, ED     Status: Abnormal   Collection Time: 05/24/18  3:19 PM  Result Value Ref Range   Glucose-Capillary 156 (H) 70 - 99 mg/dL  CBG monitoring, ED     Status: Abnormal   Collection Time: 05/24/18  4:11 PM  Result Value Ref Range   Glucose-Capillary 216 (H) 70 - 99 mg/dL  CBG monitoring, ED     Status: Abnormal   Collection Time: 05/24/18  5:02 PM  Result Value Ref Range   Glucose-Capillary 198 (H) 70 - 99 mg/dL  CBG monitoring, ED     Status: Abnormal   Collection Time: 05/24/18  6:01 PM  Result Value Ref Range   Glucose-Capillary 144 (H) 70 - 99 mg/dL  CBG monitoring, ED     Status: Abnormal   Collection Time: 05/24/18  7:02 PM  Result Value Ref Range   Glucose-Capillary 59 (L) 70 - 99 mg/dL  CBG monitoring, ED     Status: None   Collection Time: 05/24/18  7:38 PM  Result Value Ref Range   Glucose-Capillary 93 70 - 99 mg/dL  Urinalysis, Routine w reflex microscopic     Status: Abnormal   Collection Time: 05/24/18  7:39 PM  Result Value Ref Range   Color, Urine YELLOW YELLOW   APPearance CLEAR CLEAR   Specific Gravity, Urine 1.031 (H) 1.005 - 1.030   pH 5.0 5.0 - 8.0   Glucose, UA 150 (A) NEGATIVE mg/dL   Hgb urine dipstick NEGATIVE NEGATIVE   Bilirubin Urine NEGATIVE NEGATIVE   Ketones, ur 5 (A) NEGATIVE mg/dL   Protein, ur 100 (A) NEGATIVE mg/dL   Nitrite NEGATIVE NEGATIVE    Leukocytes, UA NEGATIVE NEGATIVE   RBC / HPF 0-5 0 - 5 RBC/hpf   WBC, UA 0-5 0 - 5 WBC/hpf   Bacteria, UA RARE (A) NONE SEEN   Squamous Epithelial / LPF 0-5 0 - 5   Mucus PRESENT    Hyaline Casts, UA PRESENT    Sperm, UA PRESENT     Comment: Performed at [facility], [place] [address] [place], [place] [address]  Rapid urine drug screen (hospital performed)     Status: Abnormal   Collection Time: 05/24/18  7:39 PM  Result Value Ref Range   Opiates NONE DETECTED NONE DETECTED   Cocaine POSITIVE (A) NONE DETECTED   Benzodiazepines NONE DETECTED NONE DETECTED   Amphetamines NONE DETECTED NONE DETECTED   Tetrahydrocannabinol POSITIVE (A) NONE DETECTED   Barbiturates NONE DETECTED NONE DETECTED    Comment: (NOTE) DRUG SCREEN FOR MEDICAL PURPOSES ONLY.  IF CONFIRMATION IS NEEDED FOR ANY PURPOSE, NOTIFY LAB WITHIN 5 DAYS. LOWEST DETECTABLE LIMITS FOR URINE DRUG SCREEN Drug Class                     Cutoff (ng/mL) Amphetamine  and metabolites    1000 Barbiturate and metabolites    200 Benzodiazepine                 993 Tricyclics and metabolites     300 Opiates and metabolites        300 Cocaine and metabolites        300 THC                            50 Performed at [facility], [place] [address] [place], [place] [address]    Dg Chest 2 View  Result Date: 05/24/2018 CLINICAL DATA:  Hypoglycemia.  History of substance abuse. EXAM: CHEST - 2 VIEW COMPARISON:  Chest radiograph March 31, 2018 FINDINGS: Cardiomediastinal silhouette is normal. No pleural effusions or focal consolidations. Trachea projects midline and there is no pneumothorax. Soft tissue planes and included osseous structures are non-suspicious. IMPRESSION: Negative. Electronically Signed   By: Elon Alas M.D.   On: 05/24/2018 16:59    Pending Labs Unresulted Labs (From admission, onward)    Start     Ordered   05/25/18 0500  CBC  Tomorrow morning,   R     05/24/18 2001    05/25/18 7893  Basic metabolic panel  Tomorrow morning,   R     05/24/18 2001   05/24/18 2000  CK  Add-on,   R     05/24/18 2001          Vitals/Pain Today's Vitals   05/24/18 1745 05/24/18 1800 05/24/18 1900 05/24/18 1930  BP:  125/81 124/83 122/84  Pulse:   73 71  Resp: _0 TempSrc:      SpO2:   100% 100%  Weight:      PainSc:        Isolation Precautions No active isolations  Medications Medications  sodium chloride flush (NS) 0.9 % injection 3 mL (3 mLs Intravenous Given 05/24/18 1541)  sodium chloride flush (NS) 0.9 % injection 3 mL (has no administration in time range)  0.9 %  sodium chloride infusion (has no administration in time range)  dextrose 10 % infusion ( Intravenous Restarted 05/24/18 1904)  enoxaparin (LOVENOX) injection 40 mg (has no administration in time range)  ondansetron (ZOFRAN) tablet 4 mg (has no administration in time range)    Or  ondansetron (ZOFRAN) injection 4 mg (has no administration in time range)  acetaminophen (TYLENOL) tablet 650 mg (has no administration in time range)    Or  acetaminophen (TYLENOL) suppository 650 mg (has no administration in time range)  dextrose 50 % solution 50 mL (50 mLs Intravenous Given 05/24/18 1400)  dextrose 50 % solution 50 mL (50 mLs Intravenous Given 05/24/18 1500)  glucagon (human recombinant) (GLUCAGEN) injection 1 mg (1 mg Intravenous Given 05/24/18 1546)  dextrose 10 % infusion (0 mLs Intravenous Stopped 05/24/18 1623)    Mobility walks

## 2018-05-24 NOTE — ED Triage Notes (Signed)
Pt arrives via [facility] from home. Pt is diabetic. Pt had CBG 19mg /dL upon EMS arrival. EMS administered 25g D10 IV. Pts repeat CBG 95mg /dL. Pt unsure of insulin use this am. Pt endorses marijuana use and cocaine this am.

## 2018-05-24 NOTE — ED Notes (Signed)
Claudia, PA at bedside 

## 2018-05-24 NOTE — ED Notes (Signed)
Lab called to add on Trop.  

## 2018-05-24 NOTE — ED Provider Notes (Addendum)
[facility]-EMERGENCY DEPT Provider Note   CSN: [id number] Arrival date & time: 05/24/18  1338     History   Chief Complaint Chief Complaint  Patient presents with  . Hypoglycemia    HPI John Cox is a 47 y.o. male with history of diabetes on insulin, DKA, anxiety, depression, polysubstance abuse is brought to the ER for hypoglycemia.  History is obtained from triage note, RN.  Patient is mentating well but drowsy.  He is alert and oriented to self, place, year but not events leading up to ER visit or the day.  He cannot tell me what he did today or yesterday.  He cannot tell me when he last used insulin.  He cannot tell me when he last ate.  Admits to using marijuana and cocaine recently.  He reports bilateral leg pain onset 2 days ago, no trauma.  Denies headache, chest pain, shortness of breath, abdominal pain.  No recent fevers, cough, vomiting, diarrhea, dysuria.  Per RN, patient's mother called EMS. Level 5 caveat due to acute confusion/hypoglycemia.  CBG with EMS was 19, he received 25g D10 with improvement in CBG to 95.  On arrival CBG 21 and received a second D50, repeat CBG 157.  HPI  Past Medical History:  Diagnosis Date  . AKI (acute kidney injury) (HCC) 01/25/2018  . Anxiety   . Bipolar disorder (HCC)   . Depression 2000  . Diabetes mellitus 1995  . Hyperglycemia 01/25/2018  . Nausea & vomiting   . Polysubstance abuse (HCC) 2012   relapse 2 wk ago   . Suicidal ideation     Patient Active Problem List   Diagnosis Date Noted  . Overdose 06/26/2018  . MDD (major depressive disorder), severe (HCC) 05/27/2018  . Tachy-brady syndrome (HCC) 05/26/2018  . Hypoglycemia 05/24/2018  . DKA, type 1 (HCC) 03/31/2018  . Hyperglycemia due to type 1 diabetes mellitus (HCC) 02/23/2018  . Cocaine dependence with cocaine-induced mood disorder (HCC)   . MDD (major depressive disorder), recurrent severe, without psychosis (HCC) 02/22/2018  . Malnutrition  of moderate degree 07/19/2015  . AKI (acute kidney injury) (HCC) 07/18/2015  . Marijuana abuse 07/18/2015  . Dehydration 07/18/2015  . Dental caries 07/18/2015  . Hyperlipidemia associated with type 2 diabetes mellitus (HCC) 05/24/2014  . Polysubstance abuse (HCC) 05/13/2014  . Major depression, recurrent (HCC) 05/13/2014  . Suicidal ideation 05/13/2014  . ANTICARDIOLIPIN ANTIBODY SYNDROME 05/12/2007  . Type 1 diabetes mellitus (HCC) 04/26/2007  . TOBACCO ABUSE 04/26/2007  . 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 NO PAST SURGERIES          Home Medications    Prior to Admission medications   Medication Sig Start Date End Date Taking? Authorizing Provider  insulin aspart (NOVOLOG) 100 UNIT/ML injection Inject 0-9 Units into the skin 3 (three) times daily with meals. 04/02/18  Yes Choi, Jennifer, DO  blood glucose meter kit and supplies Dispense based on patient and insurance preference. Use up to four times daily as directed. (FOR ICD-10 E10.9, E11.9). 04/02/18   Choi, Jennifer, DO  cyclobenzaprine (FLEXERIL) 5 MG tablet Take 1 tablet (5 mg total) by mouth 3 (three) times daily as needed for muscle spasms. 06/27/18 07/27/18  Samtani, Jai-Gurmukh, MD  FLUoxetine (PROZAC) 20 MG capsule Take 1 capsule (20 mg total) by mouth daily. For mood control 06/03/18   Money, Travis B, FNP  gabapentin (NEURONTIN) 100 MG capsule Take 1 capsule (  100 mg total) by mouth 3 (three) times daily. 06/03/18 07/03/18  Money, Travis B, FNP  hydrOXYzine (ATARAX/VISTARIL) 25 MG tablet Take 1 tablet (25 mg total) by mouth 3 (three) times daily as needed for itching, anxiety or nausea. 06/03/18   Money, Travis B, FNP  insulin aspart protamine- aspart (NOVOLOG MIX 70/30) (70-30) 100 UNIT/ML injection Inject 0.28 mLs (28 Units total) into the skin daily with supper. 06/03/18   Money, Travis B, FNP  insulin aspart protamine- aspart (NOVOLOG MIX 70/30) (70-30) 100 UNIT/ML  injection Inject 0.22 mLs (22 Units total) into the skin daily with breakfast. 06/04/18   Money, Travis B, FNP  traZODone (DESYREL) 100 MG tablet Take 1 tablet (100 mg total) by mouth at bedtime as needed for sleep. 06/03/18   Money, Travis B, FNP    Family History Family History  Problem Relation Age of Onset  . Diabetes Mother   . COPD Mother   . Heart disease Mother     Social History Social History   Tobacco Use  . Smoking status: Current Every Day Smoker    Packs/day: 1.00    Years: 25.00    Pack years: 25.00    Types: Cigarettes  . Smokeless tobacco: Never Used  Substance Use Topics  . Alcohol use: Yes    Alcohol/week: 1.0 standard drinks    Types: 1 Cans of beer per week    Comment: one beer weekly  . Drug use: Yes    Types: Marijuana, Cocaine    Comment: THC daily, Cocaine daily     Allergies   Patient has no known allergies.   Review of Systems Review of Systems  Unable to perform ROS: Other (hypoglycemia)  Musculoskeletal: Positive for myalgias (bilateral leg pain).  All other systems reviewed and are negative.    Physical Exam Updated Vital Signs BP 104/68 (BP Location: Right Arm)   Pulse 65   Temp 98 F (36.7 C) (Oral)   Resp 18   Ht 5' 10" (1.778 m)   Wt 69.6 kg   SpO2 97%   BMI 22.02 kg/m   Physical Exam  Constitutional: He appears well-developed and well-nourished. No distress.  Asleep but easily arousable to voice.  Nontoxic.  Keeps his eyes closed during conversation.  HENT:  Head: Normocephalic and atraumatic.  Right Ear: External ear normal.  Left Ear: External ear normal.  Nose: Nose normal.  Moist mucous membranes.  No intraoral or tongue injury.  No obvious injury to facial or scalp bones.  Eyes: Conjunctivae and EOM are normal. No scleral icterus.  Neck: Normal range of motion. Neck supple.  No midline C-spine tenderness.  Full range of motion of the neck without pain.  Cardiovascular: Normal rate, regular rhythm and normal  heart sounds.  2+ radial and DP pulses bilaterally.  No lower extremity edema or calf tenderness.  Pulmonary/Chest: Effort normal and breath sounds normal.  Abdominal: Soft. There is no tenderness.  No suprapubic or CVA tenderness.  Musculoskeletal: Normal range of motion. He exhibits no deformity.  Neurological: He is alert.  Oriented to self, place and year.  Disoriented to day or events leading to the ER. Speech is fluent without obvious dysarthria or dysphasia. Strength 5/5 with hand grip and ankle F/E.   Sensation to light touch intact in hands and feet. No truncal sway.  Skin: Skin is warm and dry. Capillary refill takes less than 2 seconds.  Psychiatric: He has a normal mood and affect. His behavior is normal. Judgment   and thought content normal.  Nursing note and vitals reviewed.    ED Treatments / Results  Labs (all labs ordered are listed, but only abnormal results are displayed) Labs Reviewed  CBC WITH DIFFERENTIAL/PLATELET - Abnormal; Notable for the following components:      Result Value   WBC 3.9 (*)    All other components within normal limits  COMPREHENSIVE METABOLIC PANEL - Abnormal; Notable for the following components:   BUN 24 (*)    Creatinine, Ser 1.40 (*)    Total Bilirubin 1.3 (*)    GFR calc non Af Amer 58 (*)    All other components within normal limits  URINALYSIS, ROUTINE W REFLEX MICROSCOPIC - Abnormal; Notable for the following components:   Specific Gravity, Urine 1.031 (*)    Glucose, UA 150 (*)    Ketones, ur 5 (*)    Protein, ur 100 (*)    Bacteria, UA RARE (*)    All other components within normal limits  ACETAMINOPHEN LEVEL - Abnormal; Notable for the following components:   Acetaminophen (Tylenol), Serum <10 (*)    All other components within normal limits  RAPID URINE DRUG SCREEN, HOSP PERFORMED - Abnormal; Notable for the following components:   Cocaine POSITIVE (*)    Tetrahydrocannabinol POSITIVE (*)    All other components within  normal limits  CBC - Abnormal; Notable for the following components:   WBC 3.3 (*)    All other components within normal limits  BASIC METABOLIC PANEL - Abnormal; Notable for the following components:   Glucose, Bld 184 (*)    All other components within normal limits  GLUCOSE, CAPILLARY - Abnormal; Notable for the following components:   Glucose-Capillary 220 (*)    All other components within normal limits  GLUCOSE, CAPILLARY - Abnormal; Notable for the following components:   Glucose-Capillary 284 (*)    All other components within normal limits  GLUCOSE, CAPILLARY - Abnormal; Notable for the following components:   Glucose-Capillary 237 (*)    All other components within normal limits  GLUCOSE, CAPILLARY - Abnormal; Notable for the following components:   Glucose-Capillary 140 (*)    All other components within normal limits  GLUCOSE, CAPILLARY - Abnormal; Notable for the following components:   Glucose-Capillary 162 (*)    All other components within normal limits  GLUCOSE, CAPILLARY - Abnormal; Notable for the following components:   Glucose-Capillary 255 (*)    All other components within normal limits  GLUCOSE, CAPILLARY - Abnormal; Notable for the following components:   Glucose-Capillary 307 (*)    All other components within normal limits  BASIC METABOLIC PANEL - Abnormal; Notable for the following components:   Glucose, Bld 455 (*)    Calcium 8.8 (*)    All other components within normal limits  HEMOGLOBIN A1C - Abnormal; Notable for the following components:   Hgb A1c MFr Bld 10.6 (*)    All other components within normal limits  GLUCOSE, CAPILLARY - Abnormal; Notable for the following components:   Glucose-Capillary 189 (*)    All other components within normal limits  GLUCOSE, CAPILLARY - Abnormal; Notable for the following components:   Glucose-Capillary 104 (*)    All other components within normal limits  GLUCOSE, CAPILLARY - Abnormal; Notable for the  following components:   Glucose-Capillary 350 (*)    All other components within normal limits  GLUCOSE, CAPILLARY - Abnormal; Notable for the following components:   Glucose-Capillary 496 (*)    All other   components within normal limits  GLUCOSE, CAPILLARY - Abnormal; Notable for the following components:   Glucose-Capillary 134 (*)    All other components within normal limits  GLUCOSE, CAPILLARY - Abnormal; Notable for the following components:   Glucose-Capillary 192 (*)    All other components within normal limits  GLUCOSE, CAPILLARY - Abnormal; Notable for the following components:   Glucose-Capillary 520 (*)    All other components within normal limits  GLUCOSE, CAPILLARY - Abnormal; Notable for the following components:   Glucose-Capillary 280 (*)    All other components within normal limits  GLUCOSE, CAPILLARY - Abnormal; Notable for the following components:   Glucose-Capillary 112 (*)    All other components within normal limits  GLUCOSE, CAPILLARY - Abnormal; Notable for the following components:   Glucose-Capillary 114 (*)    All other components within normal limits  GLUCOSE, CAPILLARY - Abnormal; Notable for the following components:   Glucose-Capillary 157 (*)    All other components within normal limits  GLUCOSE, CAPILLARY - Abnormal; Notable for the following components:   Glucose-Capillary 214 (*)    All other components within normal limits  CBG MONITORING, ED - Abnormal; Notable for the following components:   Glucose-Capillary 27 (*)    All other components within normal limits  CBG MONITORING, ED - Abnormal; Notable for the following components:   Glucose-Capillary 157 (*)    All other components within normal limits  CBG MONITORING, ED - Abnormal; Notable for the following components:   Glucose-Capillary 31 (*)    All other components within normal limits  CBG MONITORING, ED - Abnormal; Notable for the following components:   Glucose-Capillary 156 (*)      All other components within normal limits  CBG MONITORING, ED - Abnormal; Notable for the following components:   Glucose-Capillary 216 (*)    All other components within normal limits  CBG MONITORING, ED - Abnormal; Notable for the following components:   Glucose-Capillary 198 (*)    All other components within normal limits  CBG MONITORING, ED - Abnormal; Notable for the following components:   Glucose-Capillary 144 (*)    All other components within normal limits  CBG MONITORING, ED - Abnormal; Notable for the following components:   Glucose-Capillary 59 (*)    All other components within normal limits  LIPASE, BLOOD  ETHANOL  SALICYLATE LEVEL  TROPONIN I  CK  MAGNESIUM  GLUCOSE, CAPILLARY  CBG MONITORING, ED  CBG MONITORING, ED    EKG EKG Interpretation  Date/Time:  Monday May 24 2018 15:40:24 EDT Ventricular Rate:  72 PR Interval:    QRS Duration: 109 QT Interval:  429 QTC Calculation: 470 R Axis:   76 Text Interpretation:  Sinus rhythm Probable left atrial enlargement RSR' in V1 or V2, probably normal variant Left ventricular hypertrophy ST elev, probable normal early repol pattern Confirmed by Yelverton, David ([id number]) on 05/25/2018 5:36:32 PM   Radiology No results found.  Procedures .Critical Care Performed by: Gibbons, Claudia J, PA-C Authorized by: Gibbons, Claudia J, PA-C   Critical care provider statement:    Critical care time (minutes):  45   Critical care was necessary to treat or prevent imminent or life-threatening deterioration of the following conditions: persistent hypoglycemia requiring drip and admission.   Critical care was time spent personally by me on the following activities:  Discussions with consultants, evaluation of patient's response to treatment, examination of patient, ordering and performing treatments and interventions, ordering and review of laboratory studies, ordering   and review of radiographic studies, pulse oximetry,  re-evaluation of patient's condition, obtaining history from patient or surrogate and review of old charts   (including critical care time)  Medications Ordered in ED Medications  dextrose 50 % solution 50 mL (50 mLs Intravenous Given 05/24/18 1400)  dextrose 50 % solution 50 mL (50 mLs Intravenous Given 05/24/18 1500)  glucagon (human recombinant) (GLUCAGEN) injection 1 mg (1 mg Intravenous Given 05/24/18 1546)  dextrose 10 % infusion (0 mLs Intravenous Stopped 05/24/18 1623)     Initial Impression / Assessment and Plan / ED Course  I have reviewed the triage vital signs and the nursing notes.  Pertinent labs & imaging results that were available during my care of the patient were reviewed by me and considered in my medical decision making (see chart for details).  Clinical Course as of Jul 06 1015  Mon May 24, 2018  1515 CBG 31. Pt arousable, had cupe of juice, graham crackers and peanut butter. Will dose glucagon   [CG]  1801 CBG 144. Will stop D10 at re-check CBG    [CG]  1903 CBG dropped again to 59 after D10 was stopped   [CG]    Clinical Course User Index [CG] Kinnie Feil, PA-C    47 yo brittle diabetic with h/o polysubstance abuse, DKA here for hypoglycemia/AMS.  Hypoglycemia persistent despite D50 x 3.  Pt somnolent but arousable to voice. I gave him food.  VS WNL.  He denies infectious symptoms such as cough, vomiting, diarrhea, abdominal pain, dysuria.  Lungs CTAB. Abd non tender.  No signs of head trauma.  Pt cannot tell me his insulin regimen, does not know when he last ate.  I suspect hypoglycemia secondary to polysubstance abuse, lack of PO intake.  Will obtain screening labs, encourage PO and recheck CBG.    1535: CBG continues to drop. Will give glucagon and start D10 drip.  Expanding work up to evaluate for cardiac ischemia.  Discussed with Dr Dayna Barker.   2025: Discussed patient with Dr. Tamala Julian who will admit for persistent, recurrent hypoglycemia despite D50 x3,  glucagon, D10 drip.  Patient has been eating in the ER.  AKI noted. No signs of infection on CXR or UA. No leukocytosis.   Final Clinical Impressions(s) / ED Diagnoses   Final diagnoses:  Hypoglycemia  Acute kidney injury [facility])    ED Discharge Orders         Ordered    cyclobenzaprine (FLEXERIL) 5 MG tablet  3 times daily PRN,   Status:  Discontinued     05/27/18 1115    gabapentin (NEURONTIN) 100 MG capsule  3 times daily,   Status:  Discontinued     05/27/18 1115             Kinnie Feil, PA-C 05/24/18 2026    Mesner, Corene Cornea, MD 05/25/18 0704    Kinnie Feil, PA-C 07/05/18 1016    Mesner, Corene Cornea, MD 07/05/18 1250

## 2018-05-24 NOTE — ED Notes (Signed)
I woke patient up x2 (including sitting patient completely straight in bed)in an attempt to get patient to eat per PA's request. Patient arouses enough to voice but will not full wake up.

## 2018-05-25 ENCOUNTER — Other Ambulatory Visit: Payer: Self-pay

## 2018-05-25 ENCOUNTER — Encounter (HOSPITAL_COMMUNITY): Payer: Self-pay

## 2018-05-25 DIAGNOSIS — F1290 Cannabis use, unspecified, uncomplicated: Secondary | ICD-10-CM

## 2018-05-25 DIAGNOSIS — F419 Anxiety disorder, unspecified: Secondary | ICD-10-CM

## 2018-05-25 DIAGNOSIS — F17210 Nicotine dependence, cigarettes, uncomplicated: Secondary | ICD-10-CM

## 2018-05-25 DIAGNOSIS — R45851 Suicidal ideations: Secondary | ICD-10-CM

## 2018-05-25 DIAGNOSIS — F1490 Cocaine use, unspecified, uncomplicated: Secondary | ICD-10-CM

## 2018-05-25 LAB — CBC
HEMATOCRIT: 41.9 % (ref 39.0–52.0)
HEMOGLOBIN: 14 g/dL (ref 13.0–17.0)
MCH: 28.6 pg (ref 26.0–34.0)
MCHC: 33.4 g/dL (ref 30.0–36.0)
MCV: 85.7 fL (ref 78.0–100.0)
Platelets: 298 10*3/uL (ref 150–400)
RBC: 4.89 MIL/uL (ref 4.22–5.81)
RDW: 13.5 % (ref 11.5–15.5)
WBC: 3.3 10*3/uL — ABNORMAL LOW (ref 4.0–10.5)

## 2018-05-25 LAB — GLUCOSE, CAPILLARY
GLUCOSE-CAPILLARY: 104 mg/dL — AB (ref 70–99)
GLUCOSE-CAPILLARY: 189 mg/dL — AB (ref 70–99)
GLUCOSE-CAPILLARY: 307 mg/dL — AB (ref 70–99)
Glucose-Capillary: 237 mg/dL — ABNORMAL HIGH (ref 70–99)
Glucose-Capillary: 140 mg/dL — ABNORMAL HIGH (ref 70–99)
Glucose-Capillary: 162 mg/dL — ABNORMAL HIGH (ref 70–99)
Glucose-Capillary: 255 mg/dL — ABNORMAL HIGH (ref 70–99)

## 2018-05-25 LAB — BASIC METABOLIC PANEL
Anion gap: 10 (ref 5–15)
BUN: 17 mg/dL (ref 6–20)
CO2: 27 mmol/L (ref 22–32)
CREATININE: 1.08 mg/dL (ref 0.61–1.24)
Calcium: 8.9 mg/dL (ref 8.9–10.3)
Chloride: 103 mmol/L (ref 98–111)
GFR calc Af Amer: >60 mL/min (ref >60)
GFR calc non Af Amer: >60 mL/min (ref >60)
GLUCOSE: 184 mg/dL — AB (ref 70–99)
POTASSIUM: 3.7 mmol/L (ref 3.5–5.1)
Sodium: 140 mmol/L (ref 135–145)

## 2018-05-25 MED ORDER — GABAPENTIN 100 MG PO CAPS
100.0000 mg | ORAL_CAPSULE | Freq: Three times a day (TID) | ORAL | Status: DC
  Administered 2018-05-25 – 2018-05-27 (×6): 100 mg via ORAL
  Filled 2018-05-25 (×6): qty 1

## 2018-05-25 MED ORDER — INSULIN ASPART PROT & ASPART (70-30 MIX) 100 UNIT/ML ~~LOC~~ SUSP
10.0000 [IU] | Freq: Two times a day (BID) | SUBCUTANEOUS | Status: DC
  Administered 2018-05-25: 10 [IU] via SUBCUTANEOUS
  Filled 2018-05-25: qty 10

## 2018-05-25 MED ORDER — FLUOXETINE HCL 10 MG PO CAPS
10.0000 mg | ORAL_CAPSULE | Freq: Every day | ORAL | Status: DC
  Administered 2018-05-25 – 2018-05-27 (×3): 10 mg via ORAL
  Filled 2018-05-25 (×3): qty 1
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
## 2018-05-25 NOTE — Progress Notes (Signed)
PROGRESS NOTE [facility]   John Cox   ZOX:[id number] DOB: 06-Aug-1971  DOA: 05/24/2018 PCP: Patient, No Pcp Per   Brief Narrative:  John Cox 47 year old male with medical history significant for diabetes type 1, polysubstance abuse, bipolar disorder, anxiety and depression presented to the emergency department complaining of low blood sugars.  Patient was brought by EMS after mother found him being drowsy and shaky, upon EMS arrival blood sugar was noted to be 19, D10 was administered and CBGs went up to 95.  Patient does not recall why he came to the hospital.  He denies missing or skipping meals using his insulin as indicated.  Patient does report that he has been having low blood sugars intermittently and this has happened frequently at night when he is sleeping.  Upon ED evaluation lab work-up showed creatinine of 1.4, BUN 24 and glucose of 27.  He was given D50, with improvement on CBGs, however subsequently blood sugar trended down to 31.  Patient was admitted with working diagnosis of hypoglycemia and AKI.  Patient reported to admitting physician having suicidal thoughts  Subjective: Patient seen and examined, he is feeling well this AM. CBG's normalized, and slight elevated now. Patient report feeling severely depress and with no desire to live anymore.   Assessment & Plan: Hypoglycemia type 1 diabetes mellitus Unclear etiology at this time, may be too much insulin at nighttime.  Home insulin regimen 70/30 15 units twice daily, NovoLog sliding scale with meals and at bedtime.  Will check A1c, will decrease 70/30 to 10 units twice daily.  Monitor CBGs and adjust for 24-hour requirement.  Diabetes coordinator consulted.  Severe major depression disorder  Patient report depressive symptoms and SI for the past week he has prior history of suicide attempts.  He reports self-medicating with alcohol, cocaine and marijuana.  Psych has been consulted and recommending inpatient  psych hospitalization given high risk of harming himself.  Patient started on Prozac 10 mg daily and gabapentin 100 mg 3 times daily for alcohol use anxiety.  Monitor QTC.  Social worker consulted for inpatient psych placement.  AKI Likely dehydration Baseline creatinine around 1.  Patient creatinine improved after IV fluids. Avoid nephrotoxic agent and monitor renal function in a.m.  Okay to DC IV fluids encourage oral hydration.  Polysubstance abuse Cessation discussed.  DVT prophylaxis: Lovenox Code Status: Full code Family Communication: None at bedside Disposition Plan: Awaiting bed for inpatient psych.  Consultants:   Psych  Procedures:   None   Antimicrobials:  None    Objective: Vitals:   05/24/18 1930 05/24/18 2111 05/24/18 2119 05/25/18 0614  BP: 122/84 135/80  110/69  Pulse: 71 77  74  Resp: 11 18  16   Temp:  98.3 F (36.8 C)  98 F (36.7 C)  TempSrc:  Oral  Oral  SpO2: 100% 100%  98%  Weight:   69.6 kg   Height:   5\' 10"  (1.778 m)     Intake/Output Summary (Last 24 hours) at 05/25/2018 1624 Last data filed at 05/25/2018 1200 Gross per 24 hour  Intake 3253.99 ml  Output 425 ml  Net 2828.99 ml   Filed Weights   05/24/18 1355 05/24/18 2119  Weight: 72.6 kg 69.6 kg    Examination:  General exam: Appears calm and comfortable  Respiratory system: Clear to auscultation. No wheezes,crackle or rhonchi Cardiovascular system: S1 & S2 heard, RRR. No JVD, murmurs, rubs or gallops Gastrointestinal system: Abdomen is nondistended, soft and nontender.  Central nervous system: Alert and oriented. No focal neurological deficits. Extremities: No pedal edema. Symmetric, strength 5/5   Skin: No rashes, lesions or ulcers Psychiatry: Depressed, tearful, flat affect.  Mildly anxious   Data Reviewed: I have personally reviewed following labs and imaging studies  CBC: Recent Labs  Lab 05/24/18 1408 05/25/18 0502  WBC 3.9* 3.3*  NEUTROABS 2.1  --   HGB 13.9  14.0  HCT 42.0 41.9  MCV 85.9 85.7  PLT 310 298   Basic Metabolic Panel: Recent Labs  Lab 05/24/18 1408 05/25/18 0502  NA 142 140  K 3.5 3.7  CL 102 103  CO2 31 27  GLUCOSE 98 184*  BUN 24* 17  CREATININE 1.40* 1.08  CALCIUM 9.4 8.9   GFR: Estimated Creatinine Clearance: 82.3 mL/min (by C-G formula based on SCr of 1.08 mg/dL). Liver Function Tests: Recent Labs  Lab 05/24/18 1408  AST 34  ALT 25  ALKPHOS 72  BILITOT 1.3*  PROT 7.4  ALBUMIN 4.3   Recent Labs  Lab 05/24/18 1408  LIPASE 28   No results for input(s): AMMONIA in the last 168 hours. Coagulation Profile: No results for input(s): INR, PROTIME in the last 168 hours. Cardiac Enzymes: Recent Labs  Lab 05/24/18 1408 05/24/18 2120  CKTOTAL  --  354  TROPONINI <0.03  --    BNP (last 3 results) No results for input(s): PROBNP in the last 8760 hours. HbA1C: No results for input(s): HGBA1C in the last 72 hours. CBG: Recent Labs  Lab 05/24/18 2244 05/25/18 0217 05/25/18 0616 05/25/18 0824 05/25/18 1149  GLUCAP 284* 237* 140* 162* 255*   Lipid Profile: No results for input(s): CHOL, HDL, LDLCALC, TRIG, CHOLHDL, LDLDIRECT in the last 72 hours. Thyroid Function Tests: No results for input(s): TSH, T4TOTAL, FREET4, T3FREE, THYROIDAB in the last 72 hours. Anemia Panel: No results for input(s): VITAMINB12, FOLATE, FERRITIN, TIBC, IRON, RETICCTPCT in the last 72 hours. Sepsis Labs: No results for input(s): PROCALCITON, LATICACIDVEN in the last 168 hours.  No results found for this or any previous visit (from the past 240 hour(s)).    Radiology Studies: Dg Chest 2 View  Result Date: 05/24/2018 CLINICAL DATA:  Hypoglycemia.  History of substance abuse. EXAM: CHEST - 2 VIEW COMPARISON:  Chest radiograph March 31, 2018 FINDINGS: Cardiomediastinal silhouette is normal. No pleural effusions or focal consolidations. Trachea projects midline and there is no pneumothorax. Soft tissue planes and included  osseous structures are non-suspicious. IMPRESSION: Negative. Electronically Signed   By: Awilda Metroourtnay  Bloomer M.D.   On: 05/24/2018 16:59      Scheduled Meds: . enoxaparin (LOVENOX) injection  40 mg Subcutaneous Q24H  . FLUoxetine  10 mg Oral Daily  . gabapentin  100 mg Oral TID  . insulin aspart  0-9 Units Subcutaneous TID WC  . sodium chloride flush  3 mL Intravenous Q12H   Continuous Infusions: . sodium chloride       LOS: 0 days    Time spent: Total of 25 minutes spent with pt, greater than 50% of which was spent in discussion of  treatment, counseling and coordination of care   Latrelle DodrillEdwin Silva, MD Pager: Text Page via [email or web address]   If 7PM-7AM, please contact night-coverage [email or web address] 05/25/2018, 4:24 PM   Note - This record has been created using [employer] software. Chart creation errors have been sought, but may not always have been located. Such creation errors do not reflect on the standard of medical care.

## 2018-05-25 NOTE — Progress Notes (Signed)
Patient denies any suicidal thoughts/plan, also verified with Dr. Edward JollySilva and he stated patient denies any suicidal thoughts and psych consulted for severe depression and that patient does not need suicide sitter.

## 2018-05-25 NOTE — Progress Notes (Signed)
Inpatient Diabetes Program Recommendations  AACE/ADA: New Consensus Statement on Inpatient Glycemic Control (2015)  Target Ranges:  Prepandial:   less than 140 mg/dL      Peak postprandial:   less than 180 mg/dL (1-2 hours)      Critically ill patients:  140 - 180 mg/dL   Results for John LernerMOORE, John Cox (MRN [id number]) as of 05/25/2018 09:13  Ref. Range 05/24/2018 21:14 05/24/2018 22:44 05/25/2018 02:17 05/25/2018 06:16 05/25/2018 08:24  Glucose-Capillary Latest Ref Range: 70 - 99 mg/dL 841220 (H) 324284 (H) 401237 (H) 140 (H) 162 (H)   Review of Glycemic Control  Diabetes history: DM 1 Outpatient Diabetes medications: 70/30 15 units BID, Novolog SSI Current orders for Inpatient glycemic control: Novolog 0-9 units tid  Inpatient Diabetes Program Recommendations:    Hypoglycemia, Polysubstance abuse If patient took 70/30 insulin without eating this would have contributed to hypoglycemia.  Current on NS infusion. Patient started on Novolog 0-9 correction scale. Once glucose is above 180 mg/dl. Consider restarting basal insulin. Based on 70/30 dose patient takes the equivalent of 21 units of basal insulin (would reduce dose slightly), 9 units total of short acting insulin (3 units meal coverage if divided out).  Thanks,  Christena DeemShannon Truda Staub RN, MSN, BC-ADM Inpatient Diabetes Coordinator Team Pager [phone] (8a-5p)

## 2018-05-25 NOTE — Care Management Note (Signed)
Case Management Note  Patient Details  Name: Rhae LernerWilliam B Latour MRN: [id number] Date of Birth: July 11, 1971  Subjective/Objective: 47 y/o admitted whypoglycemia. From home. Psych cons-await recc. CSW following.                   Action/Plan:d/c home.   Expected Discharge Date:                  Expected Discharge Plan:  Home/Self Care  In-House Referral:  Clinical Social Work  Discharge planning Services  CM Consult  Post Acute Care Choice:    Choice offered to:     DME Arranged:    DME Agency:     HH Arranged:    HH Agency:     Status of Service:  In process, will continue to follow  If discussed at Long Length of Stay Meetings, dates discussed:    Additional Comments:  Lanier ClamMahabir, Taitum Alms, RN 05/25/2018, 11:59 AM

## 2018-05-26 DIAGNOSIS — E162 Hypoglycemia, unspecified: Secondary | ICD-10-CM

## 2018-05-26 DIAGNOSIS — F332 Major depressive disorder, recurrent severe without psychotic features: Secondary | ICD-10-CM

## 2018-05-26 DIAGNOSIS — I495 Sick sinus syndrome: Secondary | ICD-10-CM | POA: Diagnosis present

## 2018-05-26 DIAGNOSIS — N179 Acute kidney failure, unspecified: Secondary | ICD-10-CM

## 2018-05-26 LAB — BASIC METABOLIC PANEL
Anion gap: 8 (ref 5–15)
BUN: 16 mg/dL (ref 6–20)
CALCIUM: 8.8 mg/dL — AB (ref 8.9–10.3)
CHLORIDE: 101 mmol/L (ref 98–111)
CO2: 26 mmol/L (ref 22–32)
CREATININE: 1.08 mg/dL (ref 0.61–1.24)
GFR calc Af Amer: >60 mL/min (ref >60)
GFR calc non Af Amer: >60 mL/min (ref >60)
Glucose, Bld: 455 mg/dL — ABNORMAL HIGH (ref 70–99)
Potassium: 4.8 mmol/L (ref 3.5–5.1)
SODIUM: 135 mmol/L (ref 135–145)

## 2018-05-26 LAB — GLUCOSE, CAPILLARY
GLUCOSE-CAPILLARY: 496 mg/dL — AB (ref 70–99)
GLUCOSE-CAPILLARY: 79 mg/dL (ref 70–99)
Glucose-Capillary: 350 mg/dL — ABNORMAL HIGH (ref 70–99)

## 2018-05-26 LAB — HEMOGLOBIN A1C
Hgb A1c MFr Bld: 10.6 % — ABNORMAL HIGH (ref 4.8–5.6)
Mean Plasma Glucose: 257.52 mg/dL

## 2018-05-26 LAB — MAGNESIUM: MAGNESIUM: 2.1 mg/dL (ref 1.7–2.4)

## 2018-05-26 MED ORDER — INSULIN ASPART 100 UNIT/ML ~~LOC~~ SOLN
0.0000 [IU] | SUBCUTANEOUS | Status: DC
  Administered 2018-05-26: 9 [IU] via SUBCUTANEOUS
  Administered 2018-05-27: 3 [IU] via SUBCUTANEOUS
  Administered 2018-05-27 (×2): 2 [IU] via SUBCUTANEOUS
  Administered 2018-05-27: 1 [IU] via SUBCUTANEOUS

## 2018-05-26 MED ORDER — INSULIN ASPART PROT & ASPART (70-30 MIX) 100 UNIT/ML ~~LOC~~ SUSP
15.0000 [IU] | Freq: Two times a day (BID) | SUBCUTANEOUS | Status: DC
  Administered 2018-05-26 – 2018-05-27 (×3): 15 [IU] via SUBCUTANEOUS
  Filled 2018-05-26: qty 10

## 2018-05-26 NOTE — Progress Notes (Signed)
PROGRESS NOTE    John Cox  VQQ:[id number] DOB: [birth year] DOA: 05/24/2018 PCP: Patient, No Pcp Per    Brief Narrative:  47 year old male with medical history significant for diabetes type 1, polysubstance abuse, bipolar disorder, anxiety and depression presented to the emergency department complaining of low blood sugars.  Patient was brought by EMS after mother found him being drowsy and shaky, upon EMS arrival blood sugar was noted to be 19, D10 was administered and CBGs went up to 95.  Patient does not recall why he came to the hospital.  He denies missing or skipping meals using his insulin as indicated.  Patient does report that he has been having low blood sugars intermittently and this has happened frequently at night when he is sleeping.  Upon ED evaluation lab work-up showed creatinine of 1.4, BUN 24 and glucose of 27.  He was given D50, with improvement on CBGs, however subsequently blood sugar trended down to 31.  Patient was admitted with working diagnosis of hypoglycemia and AKI.  Patient reported to admitting physician having suicidal thoughts  Assessment & Plan:   Principal Problem:   MDD (major depressive disorder), recurrent severe, without psychosis (HCC) Active Problems:   Type 1 diabetes mellitus (HCC)   Polysubstance abuse (HCC)   Major depression, recurrent (HCC)   AKI (acute kidney injury) (HCC)   Hypoglycemia   Tachy-brady syndrome (HCC)  Hypoglycemia type 1 diabetes mellitus -Suspect related to decreased PO intake in setting of continued insulin use -Home insulin regimen 70/30 15 units twice daily,  -Continue on SSI coverage as needed -Diabetic coordinator following -glucose trends now much improved. Encourage PO intake   Severe major depression disorder  -Patient report depressive symptoms and SI for the past week he has prior history of suicide attempts.  He reports self-medicating with alcohol, cocaine and marijuana.   -Psych has been consulted and  recommending inpatient psych hospitalization given high risk of harming himself.   -Patient has been started on Prozac 10 mg daily and gabapentin 100 mg 3 times daily for alcohol use anxiety.  Monitor QTC.  Social worker consulted for inpatient psych placement. -Patient is medically cleared for inpatient psychiatry  AKI -Likely dehydration -Labs reviewed. Much improved with IVF hydration  Polysubstance abuse -Cessation was done at bedside  DVT prophylaxis: Lovenox subQ Code Status: Full Family Communication: Pt in room, family not at bedside Disposition Plan: Uncertain at this time  Consultants:   Psychiatry  Procedures:     Antimicrobials: Anti-infectives (From admission, onward)   None       Subjective: Without complaints at this time  Objective: Vitals:   05/25/18 2032 05/26/18 0439 05/26/18 0903 05/26/18 1305  BP: 116/67 118/69 120/74 132/79  Pulse: 72 68 64 84  Resp: 20 20 18 18   Temp: 97.9 F (36.6 C) (!) 97.5 F (36.4 C)  98.2 F (36.8 C)  TempSrc: Oral Oral  Oral  SpO2: 99% 100% 92% 100%  Weight:      Height:        Intake/Output Summary (Last 24 hours) at 05/26/2018 1656 Last data filed at 05/26/2018 1027 Gross per 24 hour  Intake 1320 ml  Output 1000 ml  Net 320 ml   Filed Weights   05/24/18 1355 05/24/18 2119  Weight: 72.6 kg 69.6 kg    Examination:  General exam: Appears calm and comfortable  Respiratory system: Clear to auscultation. Respiratory effort normal. Cardiovascular system: S1 & S2 heard, RRR Gastrointestinal system: Abdomen is nondistended, soft  and nontender. No organomegaly or masses felt. Normal bowel sounds heard. Central nervous system: Alert and oriented. No focal neurological deficits. Extremities: Symmetric 5 x 5 power. Skin: No rashes, lesions Psychiatry: Judgement and insight appear normal. Mood & affect appropriate.   Data Reviewed: I have personally reviewed following labs and imaging studies  CBC: Recent  Labs  Lab 05/24/18 1408 05/25/18 0502  WBC 3.9* 3.3*  NEUTROABS 2.1  --   HGB 13.9 14.0  HCT 42.0 41.9  MCV 85.9 85.7  PLT 310 298   Basic Metabolic Panel: Recent Labs  Lab 05/24/18 1408 05/25/18 0502 05/26/18 0515  NA 142 140 135  K 3.5 3.7 4.8  CL 102 103 101  CO2 31 27 26   GLUCOSE 98 184* 455*  BUN 24* 17 16  CREATININE 1.40* 1.08 1.08  CALCIUM 9.4 8.9 8.8*  MG  --   --  2.1   GFR: Estimated Creatinine Clearance: 82.3 mL/min (by C-G formula based on SCr of 1.08 mg/dL). Liver Function Tests: Recent Labs  Lab 05/24/18 1408  AST 34  ALT 25  ALKPHOS 72  BILITOT 1.3*  PROT 7.4  ALBUMIN 4.3   Recent Labs  Lab 05/24/18 1408  LIPASE 28   No results for input(s): AMMONIA in the last 168 hours. Coagulation Profile: No results for input(s): INR, PROTIME in the last 168 hours. Cardiac Enzymes: Recent Labs  Lab 05/24/18 1408 05/24/18 2120  CKTOTAL  --  354  TROPONINI <0.03  --    BNP (last 3 results) No results for input(s): PROBNP in the last 8760 hours. HbA1C: Recent Labs    05/26/18 0515  HGBA1C 10.6*   CBG: Recent Labs  Lab 05/25/18 1149 05/25/18 1636 05/25/18 1928 05/25/18 2337 05/26/18 0343  GLUCAP 255* 307* 189* 104* 350*   Lipid Profile: No results for input(s): CHOL, HDL, LDLCALC, TRIG, CHOLHDL, LDLDIRECT in the last 72 hours. Thyroid Function Tests: No results for input(s): TSH, T4TOTAL, FREET4, T3FREE, THYROIDAB in the last 72 hours. Anemia Panel: No results for input(s): VITAMINB12, FOLATE, FERRITIN, TIBC, IRON, RETICCTPCT in the last 72 hours. Sepsis Labs: No results for input(s): PROCALCITON, LATICACIDVEN in the last 168 hours.  No results found for this or any previous visit (from the past 240 hour(s)).   Radiology Studies: No results found.  Scheduled Meds: . enoxaparin (LOVENOX) injection  40 mg Subcutaneous Q24H  . FLUoxetine  10 mg Oral Daily  . gabapentin  100 mg Oral TID  . insulin aspart  0-9 Units Subcutaneous  Q4H  . insulin aspart protamine- aspart  15 Units Subcutaneous BID WC  . sodium chloride flush  3 mL Intravenous Q12H   Continuous Infusions: . sodium chloride       LOS: 0 days   Rickey Barbara, MD [facility] Pager On Amion  If 7PM-7AM, please contact night-coverage 05/26/2018, 4:56 PM

## 2018-05-26 NOTE — Progress Notes (Signed)
Inpatient Diabetes Program Recommendations  AACE/ADA: New Consensus Statement on Inpatient Glycemic Control (2015)  Target Ranges:  Prepandial:   less than 140 mg/dL      Peak postprandial:   less than 180 mg/dL (1-2 hours)      Critically ill patients:  140 - 180 mg/dL   Results for [name], [name] (MRN [id number]) as of 05/26/2018 09:33  Ref. Range 05/25/2018 02:17 05/25/2018 06:16 05/25/2018 08:24 05/25/2018 11:49 05/25/2018 16:36 05/25/2018 19:28  Glucose-Capillary Latest Ref Range: 70 - 99 mg/dL 409 (H) 811 (H) 914 (H)  2 units NOVOLOG  255 (H)  5 units NOVOLOG  307 (H)  7 units NOVOLOG  189 (H)    10 units 70/30 Insulin   Results for [name], [name] (MRN [id number]) as of 05/26/2018 09:33  Ref. Range 05/25/2018 23:37 05/26/2018 03:43  Glucose-Capillary Latest Ref Range: 70 - 99 mg/dL 086 (H) 578 (H)   Results for John Cox, John Cox (MRN [id number]) as of 05/26/2018 09:33  Ref. Range 11/14/2017 02:46 01/25/2018 12:14 05/26/2018 05:15  Hemoglobin A1C Latest Ref Range: 4.8 - 5.6 % 10.5 (H) 11.4 (H) 10.6 (H)  Average glucose 257 mg/dl     Admit with: Hypoglycemia  History: Type 1 DM, Polysubstance Abuse, Bipolar d/o  Home DM Meds: 70/30 Insulin- 15 units BID       Novolog SSI  Current Orders: 70/30 Insulin- 15 units BID      Novolog Sensitive Correction Scale/ SSI (0-9 units) TID AC      No PCP listed.  No Insurance listed.  Note 70/30 Insulin increased to 15 units BID this AM.  Agree with upward adjustment.    Current A1c of 10.6% shows continued poor glucose control at home.  C-Peptide was very low (<0.1) drawn back in March of this year.  Pt not making any endogenous insulin so at very high risk for DKA if he does not get long-acting insulin.  Have placed Care Management consult to assist pt with finding follow up care for DM after d/c.    --Will follow patient during hospitalization--  Ambrose Finland RN, MSN, CDE Diabetes Coordinator Inpatient  Glycemic Control Team Team Pager: [phone] (8a-5p)

## 2018-05-26 NOTE — Progress Notes (Signed)
Met with pt today.  Patient told me he only checks his CBGs about 5 times per week using his mother's CBG meter (does not have his own meter).  Buys 70/30 Insulin at [facility] for $25 per vial when he has money.  Does not take insulin consistently.  Had an appt at [facility] a few months ago but missed his appt (stated he had a funeral to go to).  Discussed with pt that he really needs regular medical care to better manage his CBGs.  Explained to pt that he should not miss his insulin doses (has very low C-peptide level) and that he should not be adjusting his insulin doses by himself.  Explained to pt that the doctors and RNs at the [facility] can follow his CBGs and better help him adjust his insulin, however, he needs to keep his appts.  Stated to pt that I would place a care mgmt consult and ask the team if they can try to set him back up at the clinic.  In the meantime, I encouraged pt to purchase a CBG OTC at [facility].  Spoke with patient about his current A1c of 10.6%.  Explained what an A1c is and what it measures.  Reminded patient that his goal A1c is 7% or less per ADA standards to prevent both acute and long-term complications.  Explained to patient the extreme importance of good glucose control at home.  Encouraged patient to check his CBGs at least bid at home (before taking 70/30 Insulin with meals) and to record all CBGs in a logbook for his PCP to review.  Note that pt was positive for Cocaine this admission.  With this information in mind, it will be very hard for pt to take care of himself and his diabetes if the substance abuse continues.   --Will follow patient during hospitalization--  Wyn Quaker RN, MSN, CDE Diabetes Coordinator Inpatient Glycemic Control Team Team Pager: [phone] (8a-5p)

## 2018-05-26 NOTE — Care Management Note (Signed)
Case Management Note  Patient Details  Name: John Cox MRN: [id number] Date of Birth: [birth year]  Subjective/Objective: CM referral for pcp-can offer if d/c plan is home. Psych-recc IP Psych vs IVC-CSW following.                    Action/Plan:IP Psych   Expected Discharge Date:                  [facility] Plan[facility]  [facility]  In-House Referral:  Clinical Social Work  Discharge planning Services  CM Consult  Post Acute Care Choice:    Choice offered to:     DME Arranged:    DME Agency:     HH Arranged:    HH Agency:     Status of Service:  In process, will continue to follow  If discussed at Long Length of Stay Meetings, dates discussed:    Additional Comments:  Lanier Clam, RN 05/26/2018, 4:03 PM

## 2018-05-27 ENCOUNTER — Other Ambulatory Visit: Payer: Self-pay | Admitting: Registered Nurse

## 2018-05-27 ENCOUNTER — Encounter (HOSPITAL_COMMUNITY): Payer: Self-pay

## 2018-05-27 ENCOUNTER — Other Ambulatory Visit: Payer: Self-pay

## 2018-05-27 ENCOUNTER — Inpatient Hospital Stay (HOSPITAL_COMMUNITY)
Admission: AD | Admit: 2018-05-27 | Discharge: 2018-06-03 | DRG: 885 | Disposition: A | Discharge status: Home / Self Care | Payer: Federal, State, Local not specified - Other | Source: Intra-hospital | Attending: Psychiatry | Admitting: Psychiatry

## 2018-05-27 DIAGNOSIS — E1065 Type 1 diabetes mellitus with hyperglycemia: Secondary | ICD-10-CM | POA: Diagnosis not present

## 2018-05-27 DIAGNOSIS — F419 Anxiety disorder, unspecified: Secondary | ICD-10-CM | POA: Diagnosis present

## 2018-05-27 DIAGNOSIS — F1010 Alcohol abuse, uncomplicated: Secondary | ICD-10-CM | POA: Diagnosis present

## 2018-05-27 DIAGNOSIS — R45851 Suicidal ideations: Secondary | ICD-10-CM | POA: Diagnosis present

## 2018-05-27 DIAGNOSIS — F17210 Nicotine dependence, cigarettes, uncomplicated: Secondary | ICD-10-CM | POA: Diagnosis present

## 2018-05-27 DIAGNOSIS — Z9114 Patient's other noncompliance with medication regimen: Secondary | ICD-10-CM

## 2018-05-27 DIAGNOSIS — I495 Sick sinus syndrome: Secondary | ICD-10-CM | POA: Diagnosis present

## 2018-05-27 DIAGNOSIS — F1490 Cocaine use, unspecified, uncomplicated: Secondary | ICD-10-CM | POA: Diagnosis not present

## 2018-05-27 DIAGNOSIS — F1210 Cannabis abuse, uncomplicated: Secondary | ICD-10-CM | POA: Diagnosis present

## 2018-05-27 DIAGNOSIS — E109 Type 1 diabetes mellitus without complications: Secondary | ICD-10-CM | POA: Diagnosis present

## 2018-05-27 DIAGNOSIS — F1410 Cocaine abuse, uncomplicated: Secondary | ICD-10-CM | POA: Diagnosis present

## 2018-05-27 DIAGNOSIS — Z794 Long term (current) use of insulin: Secondary | ICD-10-CM

## 2018-05-27 DIAGNOSIS — G4700 Insomnia, unspecified: Secondary | ICD-10-CM | POA: Diagnosis present

## 2018-05-27 DIAGNOSIS — E162 Hypoglycemia, unspecified: Secondary | ICD-10-CM | POA: Diagnosis not present

## 2018-05-27 DIAGNOSIS — F332 Major depressive disorder, recurrent severe without psychotic features: Principal | ICD-10-CM | POA: Diagnosis present

## 2018-05-27 DIAGNOSIS — F322 Major depressive disorder, single episode, severe without psychotic features: Secondary | ICD-10-CM | POA: Diagnosis present

## 2018-05-27 DIAGNOSIS — F1290 Cannabis use, unspecified, uncomplicated: Secondary | ICD-10-CM | POA: Diagnosis not present

## 2018-05-27 DIAGNOSIS — Z833 Family history of diabetes mellitus: Secondary | ICD-10-CM

## 2018-05-27 DIAGNOSIS — F339 Major depressive disorder, recurrent, unspecified: Secondary | ICD-10-CM | POA: Diagnosis present

## 2018-05-27 LAB — GLUCOSE, CAPILLARY
Glucose-Capillary: 134 mg/dL — ABNORMAL HIGH (ref 70–99)
Glucose-Capillary: 192 mg/dL — ABNORMAL HIGH (ref 70–99)
Glucose-Capillary: 294 mg/dL — ABNORMAL HIGH (ref 70–99)
Glucose-Capillary: 475 mg/dL — ABNORMAL HIGH (ref 70–99)
Glucose-Capillary: 461 mg/dL — ABNORMAL HIGH (ref 70–99)
Glucose-Capillary: 141 mg/dL — ABNORMAL HIGH (ref 70–99)

## 2018-05-27 MED ORDER — INSULIN ASPART 100 UNIT/ML ~~LOC~~ SOLN
0.0000 [IU] | Freq: Three times a day (TID) | SUBCUTANEOUS | Status: DC
  Administered 2018-05-27: 15 [IU] via SUBCUTANEOUS
  Administered 2018-05-28: 3 [IU] via SUBCUTANEOUS
  Administered 2018-05-28 (×2): 8 [IU] via SUBCUTANEOUS
  Administered 2018-05-29: 15 [IU] via SUBCUTANEOUS
  Administered 2018-05-29: 20 [IU] via SUBCUTANEOUS
  Administered 2018-05-29: 3 [IU] via SUBCUTANEOUS

## 2018-05-27 MED ORDER — INSULIN ASPART 100 UNIT/ML ~~LOC~~ SOLN
15.0000 [IU] | Freq: Once | SUBCUTANEOUS | Status: AC
  Administered 2018-05-27: 15 [IU] via SUBCUTANEOUS

## 2018-05-27 MED ORDER — TRAZODONE HCL 50 MG PO TABS
50.0000 mg | ORAL_TABLET | Freq: Every evening | ORAL | Status: DC | PRN
  Administered 2018-05-27 – 2018-05-28 (×2): 50 mg via ORAL
  Filled 2018-05-27 (×3): qty 1

## 2018-05-27 MED ORDER — CYCLOBENZAPRINE HCL 5 MG PO TABS: 5.0000 mg | ORAL_TABLET | Freq: Three times a day (TID) | ORAL | 0 refills | Status: DC | PRN

## 2018-05-27 MED ORDER — MAGNESIUM HYDROXIDE 400 MG/5ML PO SUSP: 30.0000 mL | Freq: Every day | ORAL | Status: DC | PRN

## 2018-05-27 MED ORDER — FLUOXETINE HCL 20 MG PO CAPS
20.0000 mg | ORAL_CAPSULE | Freq: Every day | ORAL | Status: DC
  Administered 2018-05-27 – 2018-06-03 (×8): 20 mg via ORAL
  Filled 2018-05-27 (×11): qty 1
  Filled 2018-05-27: qty 7
  Filled 2018-05-27: qty 1

## 2018-05-27 MED ORDER — ALUM & MAG HYDROXIDE-SIMETH 200-200-20 MG/5ML PO SUSP
30.0000 mL | ORAL | Status: DC | PRN
  Administered 2018-06-01 – 2018-06-02 (×2): 30 mL via ORAL
  Filled 2018-05-27 (×2): qty 30

## 2018-05-27 MED ORDER — GABAPENTIN 100 MG PO CAPS: 100.0000 mg | ORAL_CAPSULE | Freq: Three times a day (TID) | ORAL | 0 refills | Status: DC

## 2018-05-27 MED ORDER — INSULIN ASPART PROT & ASPART (70-30 MIX) 100 UNIT/ML ~~LOC~~ SUSP
15.0000 [IU] | Freq: Two times a day (BID) | SUBCUTANEOUS | Status: DC
  Administered 2018-05-27 – 2018-05-28 (×2): 15 [IU] via SUBCUTANEOUS

## 2018-05-27 MED ORDER — GABAPENTIN 300 MG PO CAPS
300.0000 mg | ORAL_CAPSULE | Freq: Three times a day (TID) | ORAL | Status: DC
  Administered 2018-05-27 – 2018-05-28 (×3): 300 mg via ORAL
  Filled 2018-05-27 (×6): qty 1

## 2018-05-27 MED ORDER — ACETAMINOPHEN 325 MG PO TABS
650.0000 mg | ORAL_TABLET | Freq: Four times a day (QID) | ORAL | Status: DC | PRN
  Administered 2018-05-28 – 2018-06-01 (×3): 650 mg via ORAL
  Filled 2018-05-27 (×3): qty 2

## 2018-05-27 MED ORDER — INSULIN ASPART 100 UNIT/ML ~~LOC~~ SOLN: 0.0000 [IU] | Freq: Every day | SUBCUTANEOUS | Status: DC

## 2018-05-27 MED ORDER — NICOTINE 21 MG/24HR TD PT24
21.0000 mg | MEDICATED_PATCH | Freq: Every day | TRANSDERMAL | Status: DC
  Administered 2018-05-27 – 2018-05-28 (×2): 21 mg via TRANSDERMAL
  Filled 2018-05-27 (×11): qty 1

## 2018-05-27 MED ORDER — HYDROXYZINE HCL 25 MG PO TABS
25.0000 mg | ORAL_TABLET | Freq: Three times a day (TID) | ORAL | Status: DC | PRN
  Administered 2018-05-28 – 2018-06-02 (×6): 25 mg via ORAL
  Filled 2018-05-27 (×4): qty 1
  Filled 2018-05-27: qty 10
  Filled 2018-05-27: qty 1

## 2018-05-27 MED ORDER — CYCLOBENZAPRINE HCL 10 MG PO TABS
5.0000 mg | ORAL_TABLET | Freq: Three times a day (TID) | ORAL | Status: DC | PRN
  Administered 2018-05-29: 5 mg via ORAL
  Filled 2018-05-27: qty 1

## 2018-05-27 NOTE — Progress Notes (Signed)
Patient ID: John Cox, male   DOB: [birth year], 47 y.o.   MRN: [id number]  CRITICAL VALUE ALERT  Critical Value: CBG 475  Date & Time Notied:  05/27/18 1725  Provider Notified: Dr. Jola Babinski  Orders Received/Actions taken: Administer scheduled sliding scale 15 u Novolog and scheduled 70/30 mix 15 units. Recheck CBG in one hour.

## 2018-05-27 NOTE — Progress Notes (Signed)
[facility] Group Notes:  (Nursing/MHT/Case Management/Adjunct)  Date:  05/27/2018  Time:  2045  Type of Therapy:  wrap up group  Participation Level:  Active  Participation Quality:  Appropriate, Attentive, Sharing and Supportive  Affect:  Appropriate  Cognitive:  Appropriate  Insight:  Improving  Engagement in Group:  Engaged  Modes of Intervention:  Clarification, Education and Support  Summary of Progress/Problems:  Pt shared that he is happy to be here from [facility].  Pt shared that he would like to change his attitude when he gets upset. Pt shares that he gets upset and goes off and does stuff he shouldn't be doing. Pt is grateful for his two children.  John Cox S 05/27/2018, 10:14 PM

## 2018-05-27 NOTE — Progress Notes (Signed)
Patient has been accepted to [facility] for today 9/26- patient has signed voluntary paperwork and is agreeable to go. Please call report to [facility]. Patient will need to be transported via [facility] once discharge summary is completed. [facility] [phone]. CSW has notified MD and RN.   Accepting Physician: Dr. Jola Babinski Room number: 303 bed 2  Stacy Gardner, LCSW Clinical Social Worker  [facility]  [phone]

## 2018-05-27 NOTE — Discharge Summary (Signed)
Physician Discharge Summary  John Cox KKX:[id number] DOB: Feb 18, 1971 DOA: 05/24/2018  PCP: Patient, No Pcp Per  Admit date: 05/24/2018 Discharge date: 05/27/2018  Admitted From: Home Disposition:  Inpatient Psych  Recommendations for Outpatient Follow-up:  1. Follow up with PCP in 2-3 weeks 2. Sliding Scale Insulin Regimen: CBG < 70: implement hypoglycemia protocol   CBG 70 - 120: 0 units   CBG 121 - 150: 1 unit   CBG 151 - 200: 2 units   CBG 201 - 250: 3 units   CBG 251 - 300: 5 units   CBG 301 - 350: 7 units   CBG 351 - 400 9 units    1.  Discharge Condition:Stable CODE STATUS:Full Diet recommendation: Diabetic   Brief/Interim Summary: 47 year old male with medical history significant for diabetes type 1, polysubstance abuse, bipolar disorder, anxiety and depression presented to the emergency department complaining of low blood sugars. Patient was brought by EMS after mother found him being drowsy and shaky, upon EMS arrival blood sugar was noted to be 19, D10 was administered and CBGs went up to 95. Patient does not recall why he came to the hospital. He denies missing or skipping meals using hisinsulin as indicated. Patient does report that he has been having low blood sugars intermittently and this has happened frequently at night when he is sleeping. Upon ED evaluation lab work-upshowed creatinine of 1.4, BUN 24 and glucose of 27. He was given D50, with improvement on CBGs, however subsequently blood sugar trended down to 31. Patient was admitted with working diagnosis of hypoglycemia and AKI. Patient reported to admitting physician having suicidal thoughts  Hypoglycemia type 1 diabetes mellitus -Suspect related to decreased PO intake in setting of continued insulin use -Home insulin regimen 70/30 15 units twice daily,  -Continue on SSI coverage as needed -Diabetic coordinator had been following -glucose trends now much improved. Encourage PO intake   Severe  major depression disorder  -Patient report depressive symptoms and SI for the past week he has prior history of suicide attempts. He reports self-medicating with alcohol, cocaine and marijuana.  -Psych has been consulted and recommending inpatient psych hospitalization given high risk of harming himself.  -Patient has been started on Prozac 10 mg daily and gabapentin 100 mg 3 times daily for alcohol use anxiety. Social worker consulted for inpatient psych placement. -Patient is medically cleared for inpatient psychiatry  AKI -Likely dehydration -Labs reviewed. Much improved with IVF hydration  Polysubstance abuse -Cessation was done at bedside  Discharge Diagnoses:  Principal Problem:   MDD (major depressive disorder), recurrent severe, without psychosis ([place]) Active Problems:   Type 1 diabetes mellitus ([place])   Polysubstance abuse ([place])   Major depression, recurrent ([place])   AKI (acute kidney injury) ([place])   Hypoglycemia   Tachy-brady syndrome ([place])    Discharge Instructions   Allergies as of 05/27/2018   No Known Allergies     Medication List    STOP taking these medications   methocarbamol 500 MG tablet Commonly known as:  ROBAXIN   omeprazole 20 MG capsule Commonly known as:  PRILOSEC   sucralfate 1 g tablet Commonly known as:  CARAFATE     TAKE these medications   blood glucose meter kit and supplies Dispense based on patient and insurance preference. Use up to four times daily as directed. (FOR ICD-10 E10.9, E11.9).   cyclobenzaprine 5 MG tablet Commonly known as:  FLEXERIL Take 1 tablet (5 mg total) by mouth 3 (three) times daily  as needed for muscle spasms.   FLUoxetine 10 MG capsule Commonly known as:  PROZAC Take 1 capsule (10 mg total) by mouth daily.   gabapentin 100 MG capsule Commonly known as:  NEURONTIN Take 1 capsule (100 mg total) by mouth 3 (three) times daily.   insulin aspart 100 UNIT/ML injection Commonly known as:   novoLOG Inject 0-9 Units into the skin 3 (three) times daily with meals. What changed:  Another medication with the same name was removed. Continue taking this medication, and follow the directions you see here.   insulin aspart protamine- aspart (70-30) 100 UNIT/ML injection Commonly known as:  NOVOLOG MIX 70/30 Inject 0.15 mLs (15 Units total) into the skin 2 (two) times daily with a meal.   Insulin Syringe-Needle U-100 29G X 1/2" 0.3 ML Misc Use as directed.      Follow-up Information    Follow up with PCP when discharged. Schedule an appointment as soon as possible for a visit in 2 week(s).          No Known Allergies  Consultations:  Psychiatry  Procedures/Studies: Dg Chest 2 View  Result Date: 05/24/2018 CLINICAL DATA:  Hypoglycemia.  History of substance abuse. EXAM: CHEST - 2 VIEW COMPARISON:  Chest radiograph March 31, 2018 FINDINGS: Cardiomediastinal silhouette is normal. No pleural effusions or focal consolidations. Trachea projects midline and there is no pneumothorax. Soft tissue planes and included osseous structures are non-suspicious. IMPRESSION: Negative. Electronically Signed   By: Elon Alas M.D.   On: 05/24/2018 16:59    Subjective: Without complaints  Discharge Exam: Vitals:   05/26/18 2120 05/27/18 0417  BP: 128/76 104/68  Pulse: 79 65  Resp: 18 18  Temp: 98 F (36.7 C) 98 F (36.7 C)  SpO2: 100% 97%   Vitals:   05/26/18 0903 05/26/18 1305 05/26/18 2120 05/27/18 0417  BP: 120/74 132/79 128/76 104/68  Pulse: 64 84 79 65  Resp: _0 Temp:  98.2 F (36.8 C) 98 F (36.7 C) 98 F (36.7 C)  TempSrc:  Oral Oral Oral  SpO2: 92% 100% 100% 97%  Weight:      Height:        General: Pt is alert, awake, not in acute distress Cardiovascular: RRR, S1/S2 +, no rubs, no gallops Respiratory: CTA bilaterally, no wheezing, no rhonchi Abdominal: Soft, NT, ND, bowel sounds + Extremities: no edema, no cyanosis   The results of  significant diagnostics from this hospitalization (including imaging, microbiology, ancillary and laboratory) are listed below for reference.     Microbiology: No results found for this or any previous visit (from the past 240 hour(s)).   Labs: BNP (last 3 results) No results for input(s): BNP in the last 8760 hours. Basic Metabolic Panel: Recent Labs  Lab 05/24/18 1408 05/25/18 0502 05/26/18 0515  NA 142 140 135  K 3.5 3.7 4.8  CL 102 103 101  CO2 _1 GLUCOSE 98 184* 455*  BUN 24* 17 16  CREATININE 1.40* 1.08 1.08  CALCIUM 9.4 8.9 8.8*  MG  --   --  2.1   Liver Function Tests: Recent Labs  Lab 05/24/18 1408  AST 34  ALT 25  ALKPHOS 72  BILITOT 1.3*  PROT 7.4  ALBUMIN 4.3   Recent Labs  Lab 05/24/18 1408  LIPASE 28   No results for input(s): AMMONIA in the last 168 hours. CBC: Recent Labs  Lab 05/24/18 1408 05/25/18 0502  WBC 3.9* 3.3*  NEUTROABS  2.1  --   HGB 13.9 14.0  HCT 42.0 41.9  MCV 85.9 85.7  PLT 310 298   Cardiac Enzymes: Recent Labs  Lab 05/24/18 1408 05/24/18 2120  CKTOTAL  --  354  TROPONINI <0.03  --    BNP: Invalid input(s): POCBNP CBG: Recent Labs  Lab 05/25/18 2337 05/26/18 0343 05/26/18 0746 05/26/18 1941 05/27/18 0417  GLUCAP 104* 350* 496* 79 134*   D-Dimer No results for input(s): DDIMER in the last 72 hours. Hgb A1c Recent Labs    05/26/18 0515  HGBA1C 10.6*   Lipid Profile No results for input(s): CHOL, HDL, LDLCALC, TRIG, CHOLHDL, LDLDIRECT in the last 72 hours. Thyroid function studies No results for input(s): TSH, T4TOTAL, T3FREE, THYROIDAB in the last 72 hours.  Invalid input(s): FREET3 Anemia work up No results for input(s): VITAMINB12, FOLATE, FERRITIN, TIBC, IRON, RETICCTPCT in the last 72 hours. Urinalysis    Component Value Date/Time   COLORURINE YELLOW 05/24/2018 1939   APPEARANCEUR CLEAR 05/24/2018 1939   LABSPEC 1.031 (H) 05/24/2018 1939   PHURINE 5.0 05/24/2018 1939   GLUCOSEU 150  (A) 05/24/2018 1939   HGBUR NEGATIVE 05/24/2018 1939   HGBUR negative 05/12/2007 1355   [place] 05/24/2018 1939   BILIRUBINUR negative 05/23/2014 1052   KETONESUR 5 (A) 05/24/2018 1939   PROTEINUR 100 (A) 05/24/2018 1939   UROBILINOGEN 0.2 10/04/2014 1749   NITRITE NEGATIVE 05/24/2018 1939   LEUKOCYTESUR NEGATIVE 05/24/2018 1939   Sepsis Labs Invalid input(s): PROCALCITONIN,  WBC,  LACTICIDVEN Microbiology No results found for this or any previous visit (from the past 240 hour(s)).  Time spent: 31mn  SIGNED:   SMarylu Lund MD  [facility] 05/27/2018, 11:18 AM  If 7PM-7AM, please contact night-coverage

## 2018-05-27 NOTE — H&P (Signed)
Psychiatric Admission Assessment Adult  Patient Identification: John Cox MRN:  [id number] Date of Evaluation:  05/27/2018 Chief Complaint:  MDD Polysubstance Disorder Principal Diagnosis: <principal problem not specified> Diagnosis:   Patient Active Problem List   Diagnosis Date Noted  . MDD (major depressive disorder), severe ([place]) [F32.2] 05/27/2018  . Tachy-brady syndrome ([place]) [I49.5] 05/26/2018  . Hypoglycemia [E16.2] 05/24/2018  . DKA, type 1 ([place]) [E10.10] 03/31/2018  . Hyperglycemia due to type 1 diabetes mellitus ([place]) [E10.65] 02/23/2018  . Cocaine dependence with cocaine-induced mood disorder ([place]) [F14.24]   . MDD (major depressive disorder), recurrent severe, without psychosis ([place]) [F33.2] 02/22/2018  . Malnutrition of moderate degree [E44.0] 07/19/2015  . AKI (acute kidney injury) ([place]) [N17.9] 07/18/2015  . Marijuana abuse [F12.10] 07/18/2015  . Dehydration [E86.0] 07/18/2015  . Dental caries [K02.9] 07/18/2015  . Hyperlipidemia associated with type 2 diabetes mellitus ([place]) [E11.69, E78.5] 05/24/2014  . Polysubstance abuse ([place]) [F19.10] 05/13/2014  . Major depression, recurrent ([place]) [F33.9] 05/13/2014  . Suicidal ideation [R45.851] 05/13/2014  . ANTICARDIOLIPIN ANTIBODY SYNDROME [R89.4] 05/12/2007  . Type 1 diabetes mellitus ([place]) [E10.9] 04/26/2007  . TOBACCO ABUSE [F17.200] 04/26/2007  . 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  Patient is a 47 year old male with a past psychiatric history significant for depression, cocaine use disorder, marijuana use disorder and alcohol use disorder and a past medical history significant for poorly controlled diabetes mellitus type 1 who presented to the [facility] emergency department on 9/23 with a blood sugar of 19.  The patient was found by his mother down at home.  When EMS arrived his blood sugar was found to be 19.  He was transported to the emergency  room for evaluation and stabilization.  During the course of the hospitalization and stabilization of his diabetes he admitted that he used cocaine and marijuana as well as alcohol.  He stated he had been quite depressed over the last several months.  He stated that he had had a break-up with a girlfriend that is been going on for several years, and that was very difficult for him.  He was upset about unemployment, but recently got a job at [employer].  He admitted to a long-standing substance abuse history.  He stated that his longest period of sobriety over the last several years was when he was at the [facility].  He stated that he was able to remain sober for approximately 4 years there.  He denied any formal psychiatric admissions, and stated he had been in the emergency room on several occasions attempting to get help.  He had been referred to [facility] on several occasions, but failed to do that.  He did admit to suicidal ideation to the psychiatric consultant while he was in the medical side of the hospital.  During the interview today he was quite tearful and sad.  He admitted to helplessness, hopelessness and worthlessness.  He also has had some psychotic symptoms but only when he is using drugs.  He was transferred to our facility for evaluation and stabilization.  Associated Signs/Symptoms: Depression Symptoms:  depressed mood, anhedonia, insomnia, psychomotor agitation, fatigue, feelings of worthlessness/guilt, difficulty concentrating, hopelessness, suicidal thoughts without plan, anxiety, loss of energy/fatigue, disturbed sleep, (Hypo) Manic Symptoms:  Impulsivity, Irritable Mood, Anxiety Symptoms:  Excessive Worry, Psychotic Symptoms:  Hallucinations: Auditory PTSD Symptoms: Negative Total Time spent with patient: 30 minutes  Past Psychiatric History: Patient has a long history of substance  abuse.  He actually spent over 4 years in [facility].  He remained sober for most that  period of time.  He had one previous psychiatric admission as an adolescent at this facility [facility].  He has used cocaine, alcohol and marijuana.  He did have one overdose in the past.  Is the patient at risk to self? Yes.    Has the patient been a risk to self in the past 6 months? No.  Has the patient been a risk to self within the distant past? Yes.    Is the patient a risk to others? No.  Has the patient been a risk to others in the past 6 months? No.  Has the patient been a risk to others within the distant past? No.   Prior Inpatient Therapy:   Prior Outpatient Therapy:    Alcohol Screening: 1. How often do you have a drink containing alcohol?: 2 to 3 times a week 2. How many drinks containing alcohol do you have on a typical day when you are drinking?: 3 or 4 3. How often do you have six or more drinks on one occasion?: Never AUDIT-C Score: 4 4. How often during the last year have you found that you were not able to stop drinking once you had started?: Never 5. How often during the last year have you failed to do what was normally expected from you becasue of drinking?: Never 6. How often during the last year have you needed a first drink in the morning to get yourself going after a heavy drinking session?: Never 7. How often during the last year have you had a feeling of guilt of remorse after drinking?: Never 8. How often during the last year have you been unable to remember what happened the night before because you had been drinking?: Never 9. Have you or someone else been injured as a result of your drinking?: No 10. Has a relative or friend or a doctor or another health worker been concerned about your drinking or suggested you cut down?: No Alcohol Use Disorder Identification Test Final Score (AUDIT): 4 Intervention/Follow-up: AUDIT Score <7 follow-up not indicated Substance Abuse History in the last 12 months:  Yes.   Consequences of Substance  Abuse: Medical Consequences:  Patient is suffered some acute kidney injuries and may have been related to some of his substance use issues. Family Consequences:  Stated that his mother is unsure what to do with regard to helping him with his substance abuse issues. Previous Psychotropic Medications: Yes  Psychological Evaluations: No  Past Medical History:  Past Medical History:  Diagnosis Date  . AKI (acute kidney injury) ([place]) 01/25/2018  . Anxiety   . Bipolar disorder ([place])   . Depression 2000  . Diabetes mellitus 1995  . Hyperglycemia 01/25/2018  . Nausea & vomiting   . Polysubstance abuse ([place]) 2012   relapse 2 wk ago   . Suicidal ideation     Past Surgical History:  Procedure Laterality Date  . NO PAST SURGERIES     Family History:  Family History  Problem Relation Age of Onset  . Diabetes Mother   . COPD Mother   . Heart disease Mother    Family Psychiatric  History: Patient stated he has a brother who suffers either from schizophrenia or bipolar disorder. Tobacco Screening: Have you used any form of tobacco in the last 30 days? (Cigarettes, Smokeless Tobacco, Cigars, and/or Pipes): Yes Tobacco use, Select  all that apply: 5 or more cigarettes per day Are you interested in Tobacco Cessation Medications?: Yes, will notify MD for an order Counseled patient on smoking cessation including recognizing danger situations, developing coping skills and basic information about quitting provided: Refused/Declined practical counseling Social History:  Social History   Substance and Sexual Activity  Alcohol Use Yes  . Alcohol/week: 1.0 standard drinks  . Types: 1 Cans of beer per week   Comment: one beer weekly     Social History   Substance and Sexual Activity  Drug Use Yes  . Types: Marijuana, Cocaine   Comment: THC daily, Cocaine daily    Additional Social History:                           Allergies:  No Known Allergies Lab Results:  Results for orders  placed or performed during the hospital encounter of 05/27/18 (from the past 48 hour(s))  Glucose, capillary     Status: Abnormal   Collection Time: 05/27/18  3:00 PM  Result Value Ref Range   Glucose-Capillary 294 (H) 70 - 99 mg/dL    Blood Alcohol level:  Lab Results  Component Value Date   ETH <10 05/24/2018   ETH <10 76/54/6503    Metabolic Disorder Labs:  Lab Results  Component Value Date   HGBA1C 10.6 (H) 05/26/2018   MPG 257.52 05/26/2018   MPG 280.48 01/25/2018   No results found for: PROLACTIN Lab Results  Component Value Date   CHOL 215 (H) 05/23/2014   TRIG 135 05/23/2014   HDL 67 05/23/2014   CHOLHDL 3.2 05/23/2014   VLDL 27 05/23/2014   LDLCALC 121 (H) 05/23/2014   LDLCALC 101 (H) 06/01/2009    Current Medications: Current Facility-Administered Medications  Medication Dose Route Frequency Provider Last Rate Last Dose  . acetaminophen (TYLENOL) tablet 650 mg  650 mg Oral Q6H PRN Sharma Covert, MD      . alum & mag hydroxide-simeth (MAALOX/MYLANTA) 200-200-20 MG/5ML suspension 30 mL  30 mL Oral Q4H PRN Sharma Covert, MD      . cyclobenzaprine (FLEXERIL) tablet 5 mg  5 mg Oral TID PRN Sharma Covert, MD      . FLUoxetine (PROZAC) capsule 20 mg  20 mg Oral Daily Sharma Covert, MD      . gabapentin (NEURONTIN) capsule 300 mg  300 mg Oral TID Sharma Covert, MD      . hydrOXYzine (ATARAX/VISTARIL) tablet 25 mg  25 mg Oral TID PRN Sharma Covert, MD      . insulin aspart (novoLOG) injection 0-15 Units  0-15 Units Subcutaneous TID WC Sharma Covert, MD      . insulin aspart (novoLOG) injection 0-5 Units  0-5 Units Subcutaneous QHS Mallie Darting Cordie Grice, MD      . insulin aspart protamine- aspart (NOVOLOG MIX 70/30) injection 15 Units  15 Units Subcutaneous BID WC Sharma Covert, MD      . magnesium hydroxide (MILK OF MAGNESIA) suspension 30 mL  30 mL Oral Daily PRN Sharma Covert, MD      . nicotine (NICODERM CQ - dosed in mg/24  hours) patch 21 mg  21 mg Transdermal Daily Sharma Covert, MD      . traZODone (DESYREL) tablet 50 mg  50 mg Oral QHS PRN Sharma Covert, MD       PTA Medications: Medications Prior to Admission  Medication Sig Dispense  Refill Last Dose  . blood glucose meter kit and supplies Dispense based on patient and insurance preference. Use up to four times daily as directed. (FOR ICD-10 E10.9, E11.9). 1 each 0  at Unknown time  . cyclobenzaprine (FLEXERIL) 5 MG tablet Take 1 tablet (5 mg total) by mouth 3 (three) times daily as needed for muscle spasms. 30 tablet 0   . FLUoxetine (PROZAC) 10 MG capsule Take 1 capsule (10 mg total) by mouth daily. (Patient not taking: Reported on 05/24/2018) 30 capsule 0 Not Taking at Unknown time  . gabapentin (NEURONTIN) 100 MG capsule Take 1 capsule (100 mg total) by mouth 3 (three) times daily. 90 capsule 0   . insulin aspart (NOVOLOG) 100 UNIT/ML injection Inject 0-9 Units into the skin 3 (three) times daily with meals. 30 mL 0 Past Week at Unknown time  . insulin aspart protamine- aspart (NOVOLOG MIX 70/30) (70-30) 100 UNIT/ML injection Inject 0.15 mLs (15 Units total) into the skin 2 (two) times daily with a meal. 30 mL 0 Past Week at Unknown time  . Insulin Syringe-Needle U-100 (SAFETY-GLIDE 0.3CC SYR 29GX1/2) 29G X 1/2" 0.3 ML MISC Use as directed. (Patient not taking: Reported on 05/24/2018) 200 each 0 Not Taking at Unknown time    Musculoskeletal: Strength & Muscle Tone: within normal limits Gait & Station: normal Patient leans: N/A  Psychiatric Specialty Exam: Physical Exam  Nursing note and vitals reviewed. Constitutional: He is oriented to person, place, and time. He appears well-nourished.  HENT:  Head: Normocephalic and atraumatic.  Respiratory: Effort normal.  Neurological: He is alert and oriented to person, place, and time.    ROS  Blood pressure 131/83, pulse 79, temperature 98.1 F (36.7 C), temperature source Oral, resp. rate 18,  height 5' 10" (1.778 m), weight 70 kg, SpO2 98 %.Body mass index is 22.14 kg/m.  General Appearance: Disheveled  Eye Contact:  Fair  Speech:  Normal Rate  Volume:  Decreased  Mood:  Depressed  Affect:  Congruent  Thought Process:  Coherent and Descriptions of Associations: Intact  Orientation:  Full (Time, Place, and Person)  Thought Content:  Logical  Suicidal Thoughts:  Yes.  without intent/plan  Homicidal Thoughts:  No  Memory:  Immediate;   Fair Recent;   Fair Remote;   Fair  Judgement:  Intact  Insight:  Fair  Psychomotor Activity:  Psychomotor Retardation  Concentration:  Concentration: Fair and Attention Span: Fair  Recall:  [employer] of Knowledge:  Fair  Language:  Good  Akathisia:  Negative  Handed:  Right  AIMS (if indicated):     Assets:  Communication Skills Desire for Improvement Housing Resilience Social Support Talents/Skills  ADL's:  Intact  Cognition:  WNL  Sleep:       Treatment Plan Summary: Daily contact with patient to assess and evaluate symptoms and progress in treatment, Medication management and Plan : Patient is seen and examined.  Patient is a 47 year old male with the above-stated past medical and psychiatric history was transferred to our facility for continued treatment of depression, management stabilization.  He endorses symptoms of helplessness, hopelessness and worthlessness as well as suicidal ideation.  He was started on fluoxetine 10 mg p.o. daily in the medical hospital, and we will continue that but increase it to 20 mg p.o. daily.  He has poorly controlled diabetes mellitus type 1, and we will go by the recommendations that the medical hospital had 4 70/30 insulin as well as the sliding scale.  He will be introduced into the milieu.  He will be encouraged to attend groups.  He is interested in attending outpatient treatment for substance abuse in some shape or form.  He does have charges pending now and is supposed be in court tomorrow  for larceny.  I will notify social work about this.  Observation Level/Precautions:  15 minute checks  Laboratory:  Chemistry Profile  Psychotherapy:    Medications:    Consultations:    Discharge Concerns:    Estimated LOS:  Other:     Physician Treatment Plan for Primary Diagnosis: <principal problem not specified> Long Term Goal(s): Improvement in symptoms so as ready for discharge  Short Term Goals: Ability to identify changes in lifestyle to reduce recurrence of condition will improve, Ability to verbalize feelings will improve, Ability to disclose and discuss suicidal ideas, Ability to demonstrate self-control will improve, Ability to identify and develop effective coping behaviors will improve, Ability to maintain clinical measurements within normal limits will improve, Compliance with prescribed medications will improve and Ability to identify triggers associated with substance abuse/mental health issues will improve  Physician Treatment Plan for Secondary Diagnosis: Active Problems:   Major depression, recurrent ([place])   MDD (major depressive disorder), severe ([place])  Long Term Goal(s): Improvement in symptoms so as ready for discharge  Short Term Goals: Ability to identify changes in lifestyle to reduce recurrence of condition will improve, Ability to verbalize feelings will improve, Ability to disclose and discuss suicidal ideas, Ability to demonstrate self-control will improve, Ability to identify and develop effective coping behaviors will improve, Ability to maintain clinical measurements within normal limits will improve, Compliance with prescribed medications will improve and Ability to identify triggers associated with substance abuse/mental health issues will improve  I certify that inpatient services furnished can reasonably be expected to improve the patient's condition.    Sharma Covert, MD 9/26/20194:37 PM

## 2018-05-27 NOTE — Progress Notes (Signed)
Patient ID: John Cox, male   DOB: 06/15/1971, 47 y.o.   MRN: [id number]  CRITICAL VALUE ALERT  Critical Value:  CBG 461  Date & Time Notied:  05/27/18 1821  Provider Notified: Dr. Jola Babinski  Orders Received/Actions taken: Provide 15 units of Novolog once. Recheck CBG in 2 hours.

## 2018-05-27 NOTE — Care Management Note (Signed)
Case Management Note  Patient Details  Name: John Cox MRN: [id number] Date of Birth: 1971-06-30  Subjective/Objective: d/c IP Psych-CSW following.                   Action/Plan:d/c IP Psych.   Expected Discharge Date:  05/27/18               Expected Discharge Plan:  [facility]  In-House Referral:  Clinical Social Work  Discharge planning Services  CM Consult  Post Acute Care Choice:    Choice offered to:     DME Arranged:    DME Agency:     HH Arranged:    HH Agency:     Status of Service:  Completed, signed off  If discussed at [employer], dates discussed:    Additional Comments:  Lanier Clam, RN 05/27/2018, 11:26 AM

## 2018-05-27 NOTE — BHH Suicide Risk Assessment (Signed)
[facility] Admission Suicide Risk Assessment   Nursing information obtained from:  Patient, Review of record Demographic factors:  Male, Low socioeconomic status, Unemployed Current Mental Status:  Suicidal ideation indicated by patient Loss Factors:  Loss of significant relationship, Financial problems / change in socioeconomic status, Legal issues Historical Factors:  NA Risk Reduction Factors:  NA  Total Time spent with patient: 30 minutes Principal Problem: <principal problem not specified> Diagnosis:   Patient Active Problem List   Diagnosis Date Noted  . MDD (major depressive disorder), severe (HCC) [F32.2] 05/27/2018  . Tachy-brady syndrome (HCC) [I49.5] 05/26/2018  . Hypoglycemia [E16.2] 05/24/2018  . DKA, type 1 (HCC) [E10.10] 03/31/2018  . Hyperglycemia due to type 1 diabetes mellitus (HCC) [E10.65] 02/23/2018  . Cocaine dependence with cocaine-induced mood disorder (HCC) [F14.24]   . MDD (major depressive disorder), recurrent severe, without psychosis (HCC) [F33.2] 02/22/2018  . Malnutrition of moderate degree [E44.0] 07/19/2015  . AKI (acute kidney injury) (HCC) [N17.9] 07/18/2015  . Marijuana abuse [F12.10] 07/18/2015  . Dehydration [E86.0] 07/18/2015  . Dental caries [K02.9] 07/18/2015  . Hyperlipidemia associated with type 2 diabetes mellitus (HCC) [E11.69, E78.5] 05/24/2014  . Polysubstance abuse (HCC) [F19.10] 05/13/2014  . Major depression, recurrent (HCC) [F33.9] 05/13/2014  . Suicidal ideation [R45.851] 05/13/2014  . ANTICARDIOLIPIN ANTIBODY SYNDROME [R89.4] 05/12/2007  . Type 1 diabetes mellitus (HCC) [E10.9] 04/26/2007  . TOBACCO ABUSE [F17.200] 04/26/2007  . 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  Patient is a 47 year old male with a past psychiatric history significant for depression, cocaine use disorder, marijuana use disorder and alcohol use disorder and a past medical history significant for poorly controlled  diabetes mellitus type 1 who presented to the [facility] emergency department on 9/23 with a blood sugar of 19.  The patient was found by his mother down at home.  When EMS arrived his blood sugar was found to be 19.  He was transported to the emergency room for evaluation and stabilization.  During the course of the hospitalization and stabilization of his diabetes he admitted that he used cocaine and marijuana as well as alcohol.  He stated he had been quite depressed over the last several months.  He stated that he had had a break-up with a girlfriend that is been going on for several years, and that was very difficult for him.  He was upset about unemployment, but recently got a job at [employer].  He admitted to a long-standing substance abuse history.  He stated that his longest period of sobriety over the last several years was when he was at the [facility].  He stated that he was able to remain sober for approximately 4 years there.  He denied any formal psychiatric admissions, and stated he had been in the emergency room on several occasions attempting to get help.  He had been referred to [facility] on several occasions, but failed to do that.  He did admit to suicidal ideation to the psychiatric consultant while he was in the medical side of the hospital.  During the interview today he was quite tearful and sad.  He admitted to helplessness, hopelessness and worthlessness.  He also has had some psychotic symptoms but only when he is using drugs.  He was transferred to our facility for evaluation and stabilization.  Continued Clinical Symptoms:  Alcohol Use Disorder Identification Test Final Score (AUDIT): 4 The "Alcohol Use Disorders Identification Test", Guidelines for Use in Primary Care, Second Edition.  World Science writer [place]). Score between 0-7:  no or low risk or alcohol related problems. Score between 8-15:  moderate risk of alcohol related problems. Score between 16-19:  high risk of  alcohol related problems. Score 20 or above:  warrants further diagnostic evaluation for alcohol dependence and treatment.   CLINICAL FACTORS:   Depression:   Anhedonia Comorbid alcohol abuse/dependence Hopelessness Impulsivity Insomnia Alcohol/Substance Abuse/Dependencies   Musculoskeletal: Strength & Muscle Tone: within normal limits Gait & Station: normal Patient leans: N/A  Psychiatric Specialty Exam: Physical Exam  Nursing note and vitals reviewed. Constitutional: He is oriented to person, place, and time. He appears well-developed and well-nourished.  HENT:  Head: Normocephalic and atraumatic.  Respiratory: Effort normal.  Neurological: He is alert and oriented to person, place, and time.    ROS  Blood pressure 131/83, pulse 79, temperature 98.1 F (36.7 C), temperature source Oral, resp. rate 18, height 5\' 10"  (1.778 m), weight 70 kg, SpO2 98 %.Body mass index is 22.14 kg/m.  General Appearance: Disheveled  Eye Contact:  Fair  Speech:  Normal Rate  Volume:  Decreased  Mood:  Depressed  Affect:  Congruent  Thought Process:  Coherent and Descriptions of Associations: Intact  Orientation:  Full (Time, Place, and Person)  Thought Content:  Logical  Suicidal Thoughts:  Yes.  without intent/plan  Homicidal Thoughts:  No  Memory:  Immediate;   Fair Recent;   Fair Remote;   Fair  Judgement:  Intact  Insight:  Fair  Psychomotor Activity:  Decreased  Concentration:  Concentration: Fair and Attention Span: Fair  Recall:  [employer] of Knowledge:  Fair  Language:  Fair  Akathisia:  Negative  Handed:  Right  AIMS (if indicated):     Assets:  Communication Skills Desire for Improvement Housing Physical Health Resilience Social Support  ADL's:  Intact  Cognition:  WNL  Sleep:         COGNITIVE FEATURES THAT CONTRIBUTE TO RISK:  None    SUICIDE RISK:   Mild:  Suicidal ideation of limited frequency, intensity, duration, and specificity.  There are no  identifiable plans, no associated intent, mild dysphoria and related symptoms, good self-control (both objective and subjective assessment), few other risk factors, and identifiable protective factors, including available and accessible social support.  PLAN OF CARE: Patient is seen and examined.  Patient is a 47 year old male with the above-stated past medical and psychiatric history was transferred to our facility for continued treatment of depression, management stabilization.  He endorses symptoms of helplessness, hopelessness and worthlessness as well as suicidal ideation.  He was started on fluoxetine 10 mg p.o. daily in the medical hospital, and we will continue that but increase it to 20 mg p.o. daily.  He has poorly controlled diabetes mellitus type 1, and we will go by the recommendations that the medical hospital had 4 70/30 insulin as well as the sliding scale.  He will be introduced into the milieu.  He will be encouraged to attend groups.  He is interested in attending outpatient treatment for substance abuse in some shape or form.  He does have charges pending now and is supposed be in court tomorrow for larceny.  I will notify social work about this.  I certify that inpatient services furnished can reasonably be expected to improve the patient's condition.   Antonieta Pert, MD 05/27/2018, 4:30 PM

## 2018-05-27 NOTE — Tx Team (Signed)
Initial Treatment Plan 05/27/2018 4:20 PM [name] ION:[id number]    PATIENT STRESSORS: Financial difficulties Legal issue Substance abuse   PATIENT STRENGTHS: Active sense of humor Supportive family/friends   PATIENT IDENTIFIED PROBLEMS: "get on some medicine to stop being so down"  Suicide Risk  Substance Abuse                 DISCHARGE CRITERIA:  Ability to meet basic life and health needs Adequate post-discharge living arrangements Improved stabilization in mood, thinking, and/or behavior Medical problems require only outpatient monitoring Motivation to continue treatment in a less acute level of care Need for constant or close observation no longer present Reduction of life-threatening or endangering symptoms to within safe limits Safe-care adequate arrangements made Verbal commitment to aftercare and medication compliance Withdrawal symptoms are absent or subacute and managed without 24-hour nursing intervention  PRELIMINARY DISCHARGE PLAN: Outpatient therapy  PATIENT/FAMILY INVOLVEMENT: This treatment plan has been presented to and reviewed with the patient, John Cox.  The patient and family have been given the opportunity to ask questions and make suggestions.  Ferrel Logan, RN 05/27/2018, 4:20 PM

## 2018-05-27 NOTE — Progress Notes (Addendum)
Patient ID: John Cox, male   DOB: 03-27-1971, 47 y.o.   MRN: [id number]  Admission Note  D) Patient admitted to the adult unit 300 hall. Patient is a 47 year old male who is Voluntary. Patient initially presented to the ED on 9/23 with a blood sugar of 19 (patient is a Type I diabetic). Patient is a poor historian and is vague about the events that led up to this. Patient does endorse he lives with his mother and has had a recent break up. Patient reports his ex has charges against him for larceny and he has a court date on 9/27 for this. Patient endorses use of cocaine, marijuana and occasionally alcohol. Patient reports he is not taking his medications and does not regularly check his blood sugar. Patient reports history of anxiety and depression with worsened depression over the last two weeks. Patient reports SI with no specific plan and some thoughts of HI towards his ex's new partner (with no plan). Patient denies AH/VH. Patient denies need for rehab and reports he will discharge back with his mom.  A) Skin assessment was completed and unremarkable. Patient belongings searched with no contraband found. Belongings in locker #23. Plan of care, unit policies and patient expectations were explained. Patient receptive to information given with no questions. Patient verbalized understanding and contracted for safety on the unit. Written consents obtained. Vital signs obtained and WNL. Meal tray provided. Patient oriented to the unit and their room. Patient placed on standard q15 safety checks. Low fall risk precautions initiated and reviewed with patient; patient verbalized understanding.   R) Patient is in no acute distress. Patient remains safe on the unit at this time. Patient without questions or concerns at this time. Patient is seen up in the dayroom with peers. Will continue to monitor.

## 2018-05-28 DIAGNOSIS — F322 Major depressive disorder, single episode, severe without psychotic features: Secondary | ICD-10-CM

## 2018-05-28 DIAGNOSIS — F1490 Cocaine use, unspecified, uncomplicated: Secondary | ICD-10-CM

## 2018-05-28 DIAGNOSIS — F1290 Cannabis use, unspecified, uncomplicated: Secondary | ICD-10-CM

## 2018-05-28 LAB — GLUCOSE, CAPILLARY
GLUCOSE-CAPILLARY: 257 mg/dL — AB (ref 70–99)
GLUCOSE-CAPILLARY: 169 mg/dL — AB (ref 70–99)
Glucose-Capillary: 277 mg/dL — ABNORMAL HIGH (ref 70–99)
Glucose-Capillary: 191 mg/dL — ABNORMAL HIGH (ref 70–99)

## 2018-05-28 MED ORDER — INSULIN ASPART PROT & ASPART (70-30 MIX) 100 UNIT/ML ~~LOC~~ SUSP
20.0000 [IU] | Freq: Two times a day (BID) | SUBCUTANEOUS | Status: DC
  Administered 2018-05-28 – 2018-05-30 (×4): 20 [IU] via SUBCUTANEOUS

## 2018-05-28 MED ORDER — GABAPENTIN 100 MG PO CAPS
100.0000 mg | ORAL_CAPSULE | Freq: Three times a day (TID) | ORAL | Status: DC
  Administered 2018-05-28 – 2018-06-03 (×17): 100 mg via ORAL
  Filled 2018-05-28 (×10): qty 1
  Filled 2018-05-28: qty 21
  Filled 2018-05-28 (×3): qty 1
  Filled 2018-05-28: qty 21
  Filled 2018-05-28 (×5): qty 1
  Filled 2018-05-28: qty 21
  Filled 2018-05-28 (×6): qty 1

## 2018-05-28 NOTE — BHH Group Notes (Signed)
BHH Group Notes:  (Nursing/MHT/Case Management/Adjunct)  Date:  05/28/2018  Time:  4:00 PM  Type of Therapy:  Nurse Education  Participation Level:  Did Not Attend  Summary of Progress/Problems: Patient was invited to group but did not attend.  Dewayne Shorter 05/28/2018, 6:14 PM

## 2018-05-28 NOTE — Progress Notes (Signed)
D: Patient denies SI, HI or AVG this evening. Patient has a depressed mood and flat affect but brightens on approach.  Pt. States that he is doing "ok" and is visualized in the dayroom interacting with staff/peers.  Pt. Denies any physical complaints and reports a good appetite.  Pt. Did take prn medication for sleep tonight.   A: Patient given emotional support from RN. Patient encouraged to come to staff with concerns and/or questions. Patient's medication routine continued. Patient's orders and plan of care reviewed.   R: Patient remains appropriate and cooperative. Will continue to monitor patient q15 minutes for safety.

## 2018-05-28 NOTE — Plan of Care (Signed)
Progress note  D: pt found in his room; compliant with medication administration. Pt states he slept well last night. Pt rates his depression/hopelessness/anxiety a 0/0/2 out of 10 respectively. Pt denies any physical symptoms or physical pain, rating this a 0/10. Pt states his goal for today is to continue abstinence and work on school assignments. Pt will achieve this by keeping focused on his goals. Pt denies any si/hi/ah/vh and verbally agrees to approach staff if these become apparent or before harming himself while at [facility]. A: pt provided support and encouragement. Pt given medications per protocol and standing orders. Q35m safety checks implemented and continued.  R: pt safe on the unit. Will continue to monitor.   Pt progressing in the following metrics  Problem: Education: Goal: Knowledge of [facility] Education information/materials will improve Outcome: Progressing Goal: Emotional status will improve Outcome: Progressing Goal: Mental status will improve Outcome: Progressing Goal: Verbalization of understanding the information provided will improve Outcome: Progressing   Problem: Activity: Goal: Sleeping patterns will improve Outcome: Progressing   Problem: Coping: Goal: Ability to verbalize frustrations and anger appropriately will improve Outcome: Progressing

## 2018-05-28 NOTE — Tx Team (Signed)
Interdisciplinary Treatment and Diagnostic Plan Update  05/28/2018 Time of Session:  John Cox MRN: [id number]  Principal Diagnosis: <principal problem not specified>  Secondary Diagnoses: Active Problems:   Major depression, recurrent (HCC)   MDD (major depressive disorder), severe ([place])   Current Medications:  Current Facility-Administered Medications  Medication Dose Route Frequency Provider Last Rate Last Dose  . acetaminophen (TYLENOL) tablet 650 mg  650 mg Oral Q6H PRN Sharma Covert, MD      . alum & mag hydroxide-simeth (MAALOX/MYLANTA) 200-200-20 MG/5ML suspension 30 mL  30 mL Oral Q4H PRN Sharma Covert, MD      . cyclobenzaprine (FLEXERIL) tablet 5 mg  5 mg Oral TID PRN Sharma Covert, MD      . FLUoxetine (PROZAC) capsule 20 mg  20 mg Oral Daily Sharma Covert, MD   20 mg at 05/28/18 0758  . gabapentin (NEURONTIN) capsule 300 mg  300 mg Oral TID Sharma Covert, MD   300 mg at 05/28/18 0757  . hydrOXYzine (ATARAX/VISTARIL) tablet 25 mg  25 mg Oral TID PRN Sharma Covert, MD      . insulin aspart (novoLOG) injection 0-15 Units  0-15 Units Subcutaneous TID WC Sharma Covert, MD   8 Units at 05/28/18 [phone]  . insulin aspart (novoLOG) injection 0-5 Units  0-5 Units Subcutaneous QHS Sharma Covert, MD      . insulin aspart protamine- aspart (NOVOLOG MIX 70/30) injection 15 Units  15 Units Subcutaneous BID WC Sharma Covert, MD   15 Units at 05/28/18 0756  . magnesium hydroxide (MILK OF MAGNESIA) suspension 30 mL  30 mL Oral Daily PRN Sharma Covert, MD      . nicotine (NICODERM CQ - dosed in mg/24 hours) patch 21 mg  21 mg Transdermal Daily Sharma Covert, MD   21 mg at 05/28/18 0757  . traZODone (DESYREL) tablet 50 mg  50 mg Oral QHS PRN Sharma Covert, MD   50 mg at 05/27/18 2305   PTA Medications: Medications Prior to Admission  Medication Sig Dispense Refill Last Dose  . blood glucose meter kit and supplies Dispense based  on patient and insurance preference. Use up to four times daily as directed. (FOR ICD-10 E10.9, E11.9). 1 each 0  at Unknown time  . cyclobenzaprine (FLEXERIL) 5 MG tablet Take 1 tablet (5 mg total) by mouth 3 (three) times daily as needed for muscle spasms. 30 tablet 0   . FLUoxetine (PROZAC) 10 MG capsule Take 1 capsule (10 mg total) by mouth daily. (Patient not taking: Reported on 05/24/2018) 30 capsule 0 Not Taking at Unknown time  . gabapentin (NEURONTIN) 100 MG capsule Take 1 capsule (100 mg total) by mouth 3 (three) times daily. 90 capsule 0   . insulin aspart (NOVOLOG) 100 UNIT/ML injection Inject 0-9 Units into the skin 3 (three) times daily with meals. 30 mL 0 Past Week at Unknown time  . insulin aspart protamine- aspart (NOVOLOG MIX 70/30) (70-30) 100 UNIT/ML injection Inject 0.15 mLs (15 Units total) into the skin 2 (two) times daily with a meal. 30 mL 0 Past Week at Unknown time  . Insulin Syringe-Needle U-100 (SAFETY-GLIDE 0.3CC SYR 29GX1/2) 29G X 1/2" 0.3 ML MISC Use as directed. (Patient not taking: Reported on 05/24/2018) 200 each 0 Not Taking at Unknown time    Patient Stressors: Financial difficulties Legal issue Substance abuse  Patient Strengths: Active sense of humor Supportive family/friends  Treatment Modalities: Medication  Management, Group therapy, Case management,  1 to 1 session with clinician, Psychoeducation, Recreational therapy.   Physician Treatment Plan for Primary Diagnosis: <principal problem not specified> Long Term Goal(s): Improvement in symptoms so as ready for discharge Improvement in symptoms so as ready for discharge   Short Term Goals: Ability to identify changes in lifestyle to reduce recurrence of condition will improve Ability to verbalize feelings will improve Ability to disclose and discuss suicidal ideas Ability to demonstrate self-control will improve Ability to identify and develop effective coping behaviors will improve Ability to  maintain clinical measurements within normal limits will improve Compliance with prescribed medications will improve Ability to identify triggers associated with substance abuse/mental health issues will improve Ability to identify changes in lifestyle to reduce recurrence of condition will improve Ability to verbalize feelings will improve Ability to disclose and discuss suicidal ideas Ability to demonstrate self-control will improve Ability to identify and develop effective coping behaviors will improve Ability to maintain clinical measurements within normal limits will improve Compliance with prescribed medications will improve Ability to identify triggers associated with substance abuse/mental health issues will improve  Medication Management: Evaluate patient's response, side effects, and tolerance of medication regimen.  Therapeutic Interventions: 1 to 1 sessions, Unit Group sessions and Medication administration.  Evaluation of Outcomes: Not Met  Physician Treatment Plan for Secondary Diagnosis: Active Problems:   Major depression, recurrent (HCC)   MDD (major depressive disorder), severe ([place])  Long Term Goal(s): Improvement in symptoms so as ready for discharge Improvement in symptoms so as ready for discharge   Short Term Goals: Ability to identify changes in lifestyle to reduce recurrence of condition will improve Ability to verbalize feelings will improve Ability to disclose and discuss suicidal ideas Ability to demonstrate self-control will improve Ability to identify and develop effective coping behaviors will improve Ability to maintain clinical measurements within normal limits will improve Compliance with prescribed medications will improve Ability to identify triggers associated with substance abuse/mental health issues will improve Ability to identify changes in lifestyle to reduce recurrence of condition will improve Ability to verbalize feelings will  improve Ability to disclose and discuss suicidal ideas Ability to demonstrate self-control will improve Ability to identify and develop effective coping behaviors will improve Ability to maintain clinical measurements within normal limits will improve Compliance with prescribed medications will improve Ability to identify triggers associated with substance abuse/mental health issues will improve     Medication Management: Evaluate patient's response, side effects, and tolerance of medication regimen.  Therapeutic Interventions: 1 to 1 sessions, Unit Group sessions and Medication administration.  Evaluation of Outcomes: Not Met   RN Treatment Plan for Primary Diagnosis: <principal problem not specified> Long Term Goal(s): Knowledge of disease and therapeutic regimen to maintain health will improve  Short Term Goals: Ability to participate in decision making will improve, Ability to disclose and discuss suicidal ideas, Ability to identify and develop effective coping behaviors will improve and Compliance with prescribed medications will improve  Medication Management: RN will administer medications as ordered by provider, will assess and evaluate patient's response and provide education to patient for prescribed medication. RN will report any adverse and/or side effects to prescribing provider.  Therapeutic Interventions: 1 on 1 counseling sessions, Psychoeducation, Medication administration, Evaluate responses to treatment, Monitor vital signs and CBGs as ordered, Perform/monitor CIWA, COWS, AIMS and Fall Risk screenings as ordered, Perform wound care treatments as ordered.  Evaluation of Outcomes: Not Met   LCSW Treatment Plan for Primary Diagnosis: <principal problem  not specified> Long Term Goal(s): Safe transition to appropriate next level of care at discharge, Engage patient in therapeutic group addressing interpersonal concerns.  Short Term Goals: Engage patient in aftercare  planning with referrals and resources  Therapeutic Interventions: Assess for all discharge needs, 1 to 1 time with Social worker, Explore available resources and support systems, Assess for adequacy in community support network, Educate family and significant other(s) on suicide prevention, Complete Psychosocial Assessment, Interpersonal group therapy.  Evaluation of Outcomes: Not Met   Progress in Treatment: Attending groups: No. New to unit  Participating in groups: No. Taking medication as prescribed: Yes. Toleration medication: Yes. Family/Significant other contact made: No, will contact:  if patient provides consent for collateral contacts Patient understands diagnosis: Yes. Discussing patient identified problems/goals with staff: Yes. Medical problems stabilized or resolved: Yes. Denies suicidal/homicidal ideation: Yes. Issues/concerns per patient self-inventory: No. Other:  New problem(s) identified: None   New Short Term/Long Term Goal(s):  Patient Goals:  Get on some medicine to stop being so down  Discharge Plan or Barriers: CSW will assess for appropriate referrals and discharge planning.   Reason for Continuation of Hospitalization: Depression Medication stabilization Suicidal ideation  Estimated Length of Stay:3-5 days  Attendees: Patient: 05/28/2018 8:57 AM  Physician: Dr. Myles Lipps, MD 05/28/2018 8:57 AM  Nursing: Legrand Como.Chauncey Cruel, RN 05/28/2018 8:57 AM  RN Care Manager: Rhunette Croft 05/28/2018 8:57 AM  Social Worker: Theresa Duty 05/28/2018 8:57 AM  Recreational Therapist: Rhunette Croft 05/28/2018 8:57 AM  Other: Marvia Pickles, NP 05/28/2018 8:57 AM  Other:  05/28/2018 8:57 AM  Other: 05/28/2018 8:57 AM    Scribe for Treatment Team: Marylee Floras, [place] 05/28/2018 8:57 AM

## 2018-05-28 NOTE — Progress Notes (Signed)
Patient attended AA group meeting.  
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
## 2018-05-28 NOTE — Progress Notes (Signed)
[facility] MD Progress Note  05/28/2018 3:09 PM John Cox  MRN:  [id number] Subjective: Patient is seen and examined.  Patient is a 47 year old male with a past psychiatric history significant for major depression versus substance-induced mood disorder, cocaine use disorder, marijuana use disorder and alcohol use disorder.  He is seen in follow-up.  He stated he felt better today.  His withdrawal symptoms have been at a minimum.  He stated he has been attending groups, but not really speaking.  He stated his tearfulness is a bit better.  Unfortunately he found out this morning that he did not show up in court, and he needs social work to provide some documentation that he was in the hospital.  He is concerned about that.  He denied any gross alcohol withdrawal symptoms this morning.  His vital signs are stable, he is afebrile.  He is tolerating fluoxetine without any difficulty.  He slept 5.25 hours last night.  He he did state that the Neurontin may be causing some daytime sedation.  We discussed potentially decreasing that dose.  He denied any suicidal ideation.  His blood sugar control was poor yesterday.  After he got here from the main hospital his blood sugars went above 400.  I adjusted his insulin dose yesterday.  His blood sugar this morning was 257.  I have already ordered a nutrition consult and is well we discussed good dietary control. Principal Problem: <principal problem not specified> Diagnosis:   Patient Active Problem List   Diagnosis Date Noted  . MDD (major depressive disorder), severe (HCC) [F32.2] 05/27/2018  . Tachy-brady syndrome (HCC) [I49.5] 05/26/2018  . Hypoglycemia [E16.2] 05/24/2018  . DKA, type 1 (HCC) [E10.10] 03/31/2018  . Hyperglycemia due to type 1 diabetes mellitus (HCC) [E10.65] 02/23/2018  . Cocaine dependence with cocaine-induced mood disorder (HCC) [F14.24]   . MDD (major depressive disorder), recurrent severe, without psychosis (HCC) [F33.2] 02/22/2018  .  Malnutrition of moderate degree [E44.0] 07/19/2015  . AKI (acute kidney injury) (HCC) [N17.9] 07/18/2015  . Marijuana abuse [F12.10] 07/18/2015  . Dehydration [E86.0] 07/18/2015  . Dental caries [K02.9] 07/18/2015  . Hyperlipidemia associated with type 2 diabetes mellitus (HCC) [E11.69, E78.5] 05/24/2014  . Polysubstance abuse (HCC) [F19.10] 05/13/2014  . Major depression, recurrent (HCC) [F33.9] 05/13/2014  . Suicidal ideation [R45.851] 05/13/2014  . ANTICARDIOLIPIN ANTIBODY SYNDROME [R89.4] 05/12/2007  . Type 1 diabetes mellitus (HCC) [E10.9] 04/26/2007  . TOBACCO ABUSE [F17.200] 04/26/2007  . THROMBOSIS, PORTAL VEIN [I81] 11/16/2006   Total Time spent with patient: 15 minutes  Past Psychiatric History: See admission H&P  Past Medical History:  Past Medical History:  Diagnosis Date  . AKI (acute kidney injury) (HCC) 01/25/2018  . Anxiety   . Bipolar disorder (HCC)   . Depression 2000  . Diabetes mellitus 1995  . Hyperglycemia 01/25/2018  . Nausea & vomiting   . Polysubstance abuse (HCC) 2012   relapse 2 wk ago   . Suicidal ideation     Past Surgical History:  Procedure Laterality Date  . NO PAST SURGERIES     Family History:  Family History  Problem Relation Age of Onset  . Diabetes Mother   . COPD Mother   . Heart disease Mother    Family Psychiatric  History: See admission H&P Social History:  Social History   Substance and Sexual Activity  Alcohol Use Yes  . Alcohol/week: 1.0 standard drinks  . Types: 1 Cans of beer per week   Comment: one beer weekly  Social History   Substance and Sexual Activity  Drug Use Yes  . Types: Marijuana, Cocaine   Comment: THC daily, Cocaine daily    Social History   Socioeconomic History  . Marital status: Single    Spouse name: Not on file  . Number of children: Not on file  . Years of education: Not on file  . Highest education level: Not on file  Occupational History  . Not on file  Social Needs  .  Financial resource strain: Not on file  . Food insecurity:    Worry: Not on file    Inability: Not on file  . Transportation needs:    Medical: Not on file    Non-medical: Not on file  Tobacco Use  . Smoking status: Current Every Day Smoker    Packs/day: 1.00    Years: 25.00    Pack years: 25.00    Types: Cigarettes  . Smokeless tobacco: Never Used  Substance and Sexual Activity  . Alcohol use: Yes    Alcohol/week: 1.0 standard drinks    Types: 1 Cans of beer per week    Comment: one beer weekly  . Drug use: Yes    Types: Marijuana, Cocaine    Comment: THC daily, Cocaine daily  . Sexual activity: Not Currently  Lifestyle  . Physical activity:    Days per week: Not on file    Minutes per session: Not on file  . Stress: Not on file  Relationships  . Social connections:    Talks on phone: Not on file    Gets together: Not on file    Attends religious service: Not on file    Active member of club or organization: Not on file    Attends meetings of clubs or organizations: Not on file    Relationship status: Not on file  Other Topics Concern  . Not on file  Social History Narrative  . Not on file   Additional Social History:                         Sleep: Fair  Appetite:  Good  Current Medications: Current Facility-Administered Medications  Medication Dose Route Frequency Provider Last Rate Last Dose  . acetaminophen (TYLENOL) tablet 650 mg  650 mg Oral Q6H PRN Antonieta Pert, MD      . alum & mag hydroxide-simeth (MAALOX/MYLANTA) 200-200-20 MG/5ML suspension 30 mL  30 mL Oral Q4H PRN Antonieta Pert, MD      . cyclobenzaprine (FLEXERIL) tablet 5 mg  5 mg Oral TID PRN Antonieta Pert, MD      . FLUoxetine (PROZAC) capsule 20 mg  20 mg Oral Daily Antonieta Pert, MD   20 mg at 05/28/18 0758  . gabapentin (NEURONTIN) capsule 100 mg  100 mg Oral TID Antonieta Pert, MD      . hydrOXYzine (ATARAX/VISTARIL) tablet 25 mg  25 mg Oral TID PRN Antonieta Pert, MD      . insulin aspart (novoLOG) injection 0-15 Units  0-15 Units Subcutaneous TID WC Antonieta Pert, MD   8 Units at 05/28/18 1208  . insulin aspart (novoLOG) injection 0-5 Units  0-5 Units Subcutaneous QHS Antonieta Pert, MD      . insulin aspart protamine- aspart (NOVOLOG MIX 70/30) injection 20 Units  20 Units Subcutaneous BID WC Antonieta Pert, MD      . magnesium hydroxide (MILK OF MAGNESIA) suspension 30  mL  30 mL Oral Daily PRN Antonieta Pert, MD      . nicotine (NICODERM CQ - dosed in mg/24 hours) patch 21 mg  21 mg Transdermal Daily Antonieta Pert, MD   21 mg at 05/28/18 0757  . traZODone (DESYREL) tablet 50 mg  50 mg Oral QHS PRN Antonieta Pert, MD   50 mg at 05/27/18 2305    Lab Results:  Results for orders placed or performed during the hospital encounter of 05/27/18 (from the past 48 hour(s))  Glucose, capillary     Status: Abnormal   Collection Time: 05/27/18  3:00 PM  Result Value Ref Range   Glucose-Capillary 294 (H) 70 - 99 mg/dL  Glucose, capillary     Status: Abnormal   Collection Time: 05/27/18  5:25 PM  Result Value Ref Range   Glucose-Capillary 475 (H) 70 - 99 mg/dL   Comment 1 Notify RN    Comment 2 Document in Chart   Glucose, capillary     Status: Abnormal   Collection Time: 05/27/18  6:21 PM  Result Value Ref Range   Glucose-Capillary 461 (H) 70 - 99 mg/dL  Glucose, capillary     Status: Abnormal   Collection Time: 05/27/18  8:53 PM  Result Value Ref Range   Glucose-Capillary 141 (H) 70 - 99 mg/dL  Glucose, capillary     Status: Abnormal   Collection Time: 05/28/18  5:59 AM  Result Value Ref Range   Glucose-Capillary 277 (H) 70 - 99 mg/dL   Comment 1 Notify RN    Comment 2 Document in Chart   Glucose, capillary     Status: Abnormal   Collection Time: 05/28/18 12:06 PM  Result Value Ref Range   Glucose-Capillary 257 (H) 70 - 99 mg/dL    Blood Alcohol level:  Lab Results  Component Value Date   ETH <10  05/24/2018   ETH <10 02/22/2018    Metabolic Disorder Labs: Lab Results  Component Value Date   HGBA1C 10.6 (H) 05/26/2018   MPG 257.52 05/26/2018   MPG 280.48 01/25/2018   No results found for: PROLACTIN Lab Results  Component Value Date   CHOL 215 (H) 05/23/2014   TRIG 135 05/23/2014   HDL 67 05/23/2014   CHOLHDL 3.2 05/23/2014   VLDL 27 05/23/2014   LDLCALC 121 (H) 05/23/2014   LDLCALC 101 (H) 06/01/2009    Physical Findings: AIMS:  , ,  ,  ,    CIWA:    COWS:     Musculoskeletal: Strength & Muscle Tone: within normal limits Gait & Station: normal Patient leans: N/A  Psychiatric Specialty Exam: Physical Exam  Nursing note and vitals reviewed. Constitutional: He is oriented to person, place, and time. He appears well-developed and well-nourished.  HENT:  Head: Normocephalic and atraumatic.  Respiratory: Effort normal.  Neurological: He is alert and oriented to person, place, and time.    ROS  Blood pressure 131/74, pulse 79, temperature 98.3 F (36.8 C), resp. rate 16, height 5\' 10"  (1.778 m), weight 70 kg, SpO2 98 %.Body mass index is 22.14 kg/m.  General Appearance: Casual  Eye Contact:  Fair  Speech:  Normal Rate  Volume:  Normal  Mood:  Anxious  Affect:  Congruent  Thought Process:  Coherent and Descriptions of Associations: Intact  Orientation:  Full (Time, Place, and Person)  Thought Content:  Logical  Suicidal Thoughts:  No  Homicidal Thoughts:  No  Memory:  Immediate;   Fair Recent;  Fair Remote;   Fair  Judgement:  Intact  Insight:  Fair  Psychomotor Activity:  Normal  Concentration:  Concentration: Fair and Attention Span: Fair  Recall:  [employer] of Knowledge:  Fair  Language:  Fair  Akathisia:  Negative  Handed:  Right  AIMS (if indicated):     Assets:  Communication Skills Desire for Improvement Housing Physical Health Resilience Social Support Talents/Skills  ADL's:  Intact  Cognition:  WNL  Sleep:  Number of Hours: 5.25      Treatment Plan Summary: Daily contact with patient to assess and evaluate symptoms and progress in treatment, Medication management and Plan : Patient is seen and examined.  Patient is a 47 year old male with the above-stated past psychiatric history who is seen in follow-up.  #1 polysubstance use disorder/major depression versus substance-induced mood disorder-patient continues on fluoxetine 20 mg p.o. daily for mood and anxiety.  No change in this dosage.  Blood pressure and vital signs have been stable.  No evidence of withdrawal syndrome at this time.  No change in current treatment.  #2 poorly controlled diabetes mellitus type 1-the inpatient service had recommended 70/30 insulin 15 units subcu twice daily, but his sugars are still remain significantly elevated.  They are better today.  I am increasing his insulin 7030 to 20 units subcu twice daily as well as continuing the sliding scale.  #3 peripheral neuropathy-patient received gabapentin 300 mg p.o. 3 times daily yesterday for the neuropathy, but it did sedating.  I am reducing that down to 100 mg p.o. 3 times daily.  #3 disposition-social work continues to obtain information with regard to residential treatment facilities.  Discharge planning continues to be in progress.  Antonieta Pert, MD 05/28/2018, 3:09 PM

## 2018-05-28 NOTE — Progress Notes (Addendum)
Recreation Therapy Notes  Date: 9.27.19 Time: 0930 Location: [address] Dayroom  Group Topic: Stress Management  Goal Area(s) Addresses:  Patient will verbalize importance of using healthy stress management.  Patient will identify positive emotions associated with healthy stress management.   Behavioral Response: Engaged  Intervention: Stress Management  Activity :  Meditation.  LRT introduced the stress management technique of meditation.  LRT played a meditation that focused on being resilient in the face of obstacles.  Patients were to follow along as meditation played.  Education:  Stress Management, Discharge Planning.   Education Outcome: Acknowledges edcuation/In group clarification offered/Needs additional education  Clinical Observations/Feedback: Pt attended and participated in group.      Caroll Rancher, LRT/CTRS         Caroll Rancher A 05/28/2018 12:47 PM

## 2018-05-28 NOTE — [facility] Suicide Risk Assessment (Signed)
[facility] INPATIENT:  Family/Significant Other Suicide Prevention Education  Suicide Prevention Education:  Patient Refusal for Family/Significant Other Suicide Prevention Education: The patient John Cox has refused to provide written consent for family/significant other to be provided Family/Significant Other Suicide Prevention Education during admission and/or prior to discharge.  Physician notified.  Ida Rogue 05/28/2018, 3:38 PM

## 2018-05-29 DIAGNOSIS — F17210 Nicotine dependence, cigarettes, uncomplicated: Secondary | ICD-10-CM

## 2018-05-29 DIAGNOSIS — F1410 Cocaine abuse, uncomplicated: Secondary | ICD-10-CM

## 2018-05-29 DIAGNOSIS — F419 Anxiety disorder, unspecified: Secondary | ICD-10-CM

## 2018-05-29 LAB — GLUCOSE, CAPILLARY
GLUCOSE-CAPILLARY: 442 mg/dL — AB (ref 70–99)
GLUCOSE-CAPILLARY: 343 mg/dL — AB (ref 70–99)
Glucose-Capillary: 190 mg/dL — ABNORMAL HIGH (ref 70–99)
Glucose-Capillary: 443 mg/dL — ABNORMAL HIGH (ref 70–99)
Glucose-Capillary: 235 mg/dL — ABNORMAL HIGH (ref 70–99)
Glucose-Capillary: 152 mg/dL — ABNORMAL HIGH (ref 70–99)

## 2018-05-29 MED ORDER — INSULIN ASPART 100 UNIT/ML ~~LOC~~ SOLN: 0.0000 [IU] | Freq: Three times a day (TID) | SUBCUTANEOUS | Status: DC

## 2018-05-29 MED ORDER — IBUPROFEN 600 MG PO TABS
600.0000 mg | ORAL_TABLET | Freq: Four times a day (QID) | ORAL | Status: DC | PRN
  Administered 2018-05-29 – 2018-06-02 (×5): 600 mg via ORAL
  Filled 2018-05-29 (×5): qty 1

## 2018-05-29 MED ORDER — INSULIN ASPART 100 UNIT/ML ~~LOC~~ SOLN
0.0000 [IU] | Freq: Three times a day (TID) | SUBCUTANEOUS | Status: DC
  Administered 2018-05-30 (×2): 15 [IU] via SUBCUTANEOUS

## 2018-05-29 MED ORDER — TRAZODONE HCL 100 MG PO TABS
100.0000 mg | ORAL_TABLET | Freq: Every evening | ORAL | Status: DC | PRN
  Administered 2018-05-29 – 2018-06-02 (×5): 100 mg via ORAL
  Filled 2018-05-29 (×2): qty 1
  Filled 2018-05-29: qty 7
  Filled 2018-05-29 (×3): qty 1

## 2018-05-29 NOTE — Progress Notes (Signed)
D: Pt was in hallway upon initial approach.  Pt presents with anxious, depressed affect and mood.  When asked how his day was, pt states "I can't complain, better than being out there."  Pt goal is to "sleep well."  Pt denies SI/HI, denies hallucinations, reports pain from headache of 7/10.  Pt has been visible in milieu interacting with peers and staff appropriately.  Pt attended evening group.    A: Introduced self to pt.  Actively listened to pt and offered support and encouragement. PRN medication administered for pain, sleep, and anxiety.  R: Pt is safe on the unit.  Pt is compliant with medications.  Pt verbally contracts for safety.  Will continue to monitor and assess.

## 2018-05-29 NOTE — Progress Notes (Signed)
MHT informed writer that pt's CBG was 442 mg/dL.  On-site provider notified and writer was instructed to administer Novolog 15 units and Novolog 70/30 20 units.  Medications administered.  Pt reports he plans to eat breakfast.  CBG is to be rechecked 2 hours after breakfast per provider.  Pt reports frequent urination overnight but that he slept well otherwise.  No other symptoms reported at this time.

## 2018-05-29 NOTE — Progress Notes (Signed)
Inpatient Diabetes Program Recommendations  AACE/ADA: New Consensus Statement on Inpatient Glycemic Control (2015)  Target Ranges:  Prepandial:   less than 140 mg/dL      Peak postprandial:   less than 180 mg/dL (1-2 hours)      Critically ill patients:  140 - 180 mg/dL   Lab Results  Component Value Date   GLUCAP 343 (H) 05/29/2018   HGBA1C 10.6 (H) 05/26/2018    Review of Glycemic ControlResults for JAYLAN, HINOJOSA (MRN [id number]) as of 05/29/2018 11:19  Ref. Range 05/28/2018 05:59 05/28/2018 12:06 05/28/2018 17:19 05/28/2018 21:08 05/29/2018 06:03  Glucose-Capillary Latest Ref Range: 70 - 99 mg/dL 409 (H) 811 (H) 914 (H) 169 (H) 442 (H)    Diabetes history: Type 1 DM  Outpatient Diabetes medications: Novolog 70/30 15 units bid, Novolog sensitive tid with meals Current orders for Inpatient glycemic control:  Novolog moderate tid with meals and HS Novolog 70/30 20 units bid  Inpatient Diabetes Program Recommendations:    Blood sugars were better on 05/28/18 however increased to 442 mg/dL this morning.  May need to check 2 am blood sugar to make sure he is not going low at 2AM and then going high in response to low (Somogyi effect).  Will discuss with RN.  Hesitant to recommend changes to medications today?  Thanks,  Beryl Meager, RN, BC-ADM Inpatient Diabetes Coordinator Pager [phone] (8a-5p)

## 2018-05-29 NOTE — Progress Notes (Signed)
Patient attended AA group meeting.  

## 2018-05-29 NOTE — Progress Notes (Signed)
[facility] MD Progress Note  05/29/2018 8:59 AM John Cox  MRN:  [id number] Subjective: Patient reports he is feeling better. Currently denies suicidal ideations. Denies medication side effects. Objective: I have reviewed chart notes and have met with patient. 47 year old male, employed, lives with mother. History of DM I, Depression, Substance Use Disorder ( identifies cocaine as substance of choice) . Admitted to inpatient medical unit initially, after being found unresponsive by mother with severe hypoglycemia. Seen by psychiatric consultant, who recommended inpatient psychiatric admission due to worsening depression and SI. Patient reports partially improved mood and states feels better than before admission . Denies suicidal ideations. Behavior on unit in good control, polite on approach, visible in day room. Currently future oriented, reports plan of returning home ( lives with mother) and states he is hoping he still has his job , plans to return to work soon after discharge . Of note, CBG elevated to 442 this AM- no associated symptoms at this time- patient reports he did not get insulin last night and ate a late night snack. States he is motivated in and  making an effort to follow diabetic diet at present. Repeat CBG at 9,15 343.  Denies medication side effects.  Principal Problem: MDD, Cocaine Use Disorder  Diagnosis:   Patient Active Problem List   Diagnosis Date Noted  . MDD (major depressive disorder), severe ([place]) [F32.2] 05/27/2018  . Tachy-brady syndrome ([place]) [I49.5] 05/26/2018  . Hypoglycemia [E16.2] 05/24/2018  . DKA, type 1 ([place]) [E10.10] 03/31/2018  . Hyperglycemia due to type 1 diabetes mellitus ([place]) [E10.65] 02/23/2018  . Cocaine dependence with cocaine-induced mood disorder ([place]) [F14.24]   . MDD (major depressive disorder), recurrent severe, without psychosis ([place]) [F33.2] 02/22/2018  . Malnutrition of moderate degree [E44.0] 07/19/2015  . AKI (acute kidney injury)  ([place]) [N17.9] 07/18/2015  . Marijuana abuse [F12.10] 07/18/2015  . Dehydration [E86.0] 07/18/2015  . Dental caries [K02.9] 07/18/2015  . Hyperlipidemia associated with type 2 diabetes mellitus ([place]) [E11.69, E78.5] 05/24/2014  . Polysubstance abuse ([place]) [F19.10] 05/13/2014  . Major depression, recurrent ([place]) [F33.9] 05/13/2014  . Suicidal ideation [R45.851] 05/13/2014  . ANTICARDIOLIPIN ANTIBODY SYNDROME [R89.4] 05/12/2007  . Type 1 diabetes mellitus ([place]) [E10.9] 04/26/2007  . TOBACCO ABUSE [F17.200] 04/26/2007  . 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 AKI (acute kidney injury) ([place]) 01/25/2018  . Anxiety   . Bipolar disorder ([place])   . Depression 2000  . Diabetes mellitus 1995  . Hyperglycemia 01/25/2018  . Nausea & vomiting   . Polysubstance abuse ([place]) 2012   relapse 2 wk ago   . Suicidal ideation     Past Surgical History:  Procedure Laterality Date  . NO PAST SURGERIES     Family History:  Family History  Problem Relation Age of Onset  . Diabetes Mother   . COPD Mother   . Heart disease Mother    Family Psychiatric  History: See admission H&P Social History:  Social History   Substance and Sexual Activity  Alcohol Use Yes  . Alcohol/week: 1.0 standard drinks  . Types: 1 Cans of beer per week   Comment: one beer weekly     Social History   Substance and Sexual Activity  Drug Use Yes  . Types: Marijuana, Cocaine   Comment: THC daily, Cocaine daily    Social History  Socioeconomic History  . Marital status: Single    Spouse name: Not on file  . Number of children: Not on file  . Years of education: Not on file  . Highest education level: Not on file  Occupational History  . Not on file  Social Needs  . Financial resource strain: Not on file  . Food insecurity:    Worry: Not on file     Inability: Not on file  . Transportation needs:    Medical: Not on file    Non-medical: Not on file  Tobacco Use  . Smoking status: Current Every Day Smoker    Packs/day: 1.00    Years: 25.00    Pack years: 25.00    Types: Cigarettes  . Smokeless tobacco: Never Used  Substance and Sexual Activity  . Alcohol use: Yes    Alcohol/week: 1.0 standard drinks    Types: 1 Cans of beer per week    Comment: one beer weekly  . Drug use: Yes    Types: Marijuana, Cocaine    Comment: THC daily, Cocaine daily  . Sexual activity: Not Currently  Lifestyle  . Physical activity:    Days per week: Not on file    Minutes per session: Not on file  . Stress: Not on file  Relationships  . Social connections:    Talks on phone: Not on file    Gets together: Not on file    Attends religious service: Not on file    Active member of club or organization: Not on file    Attends meetings of clubs or organizations: Not on file    Relationship status: Not on file  Other Topics Concern  . Not on file  Social History Narrative  . Not on file   Additional Social History:   Sleep: Fair- improving   Appetite:  Good  Current Medications: Current Facility-Administered Medications  Medication Dose Route Frequency Provider Last Rate Last Dose  . acetaminophen (TYLENOL) tablet 650 mg  650 mg Oral Q6H PRN Sharma Covert, MD   650 mg at 05/28/18 2113  . alum & mag hydroxide-simeth (MAALOX/MYLANTA) 200-200-20 MG/5ML suspension 30 mL  30 mL Oral Q4H PRN Sharma Covert, MD      . cyclobenzaprine (FLEXERIL) tablet 5 mg  5 mg Oral TID PRN Sharma Covert, MD      . FLUoxetine (PROZAC) capsule 20 mg  20 mg Oral Daily Sharma Covert, MD   20 mg at 05/29/18 0817  . gabapentin (NEURONTIN) capsule 100 mg  100 mg Oral TID Sharma Covert, MD   100 mg at 05/29/18 0817  . hydrOXYzine (ATARAX/VISTARIL) tablet 25 mg  25 mg Oral TID PRN Sharma Covert, MD   25 mg at 05/28/18 2113  . insulin aspart  (novoLOG) injection 0-15 Units  0-15 Units Subcutaneous TID WC Sharma Covert, MD   15 Units at 05/29/18 0617  . insulin aspart (novoLOG) injection 0-5 Units  0-5 Units Subcutaneous QHS Sharma Covert, MD      . insulin aspart protamine- aspart (NOVOLOG MIX 70/30) injection 20 Units  20 Units Subcutaneous BID WC Sharma Covert, MD   20 Units at 05/29/18 [phone]  . magnesium hydroxide (MILK OF MAGNESIA) suspension 30 mL  30 mL Oral Daily PRN Sharma Covert, MD      . nicotine (NICODERM CQ - dosed in mg/24 hours) patch 21 mg  21 mg Transdermal Daily Mallie Darting Cordie Grice, MD  21 mg at 05/28/18 0757  . traZODone (DESYREL) tablet 50 mg  50 mg Oral QHS PRN Sharma Covert, MD   50 mg at 05/28/18 2113    Lab Results:  Results for orders placed or performed during the hospital encounter of 05/27/18 (from the past 48 hour(s))  Glucose, capillary     Status: Abnormal   Collection Time: 05/27/18  3:00 PM  Result Value Ref Range   Glucose-Capillary 294 (H) 70 - 99 mg/dL  Glucose, capillary     Status: Abnormal   Collection Time: 05/27/18  5:25 PM  Result Value Ref Range   Glucose-Capillary 475 (H) 70 - 99 mg/dL   Comment 1 Notify RN    Comment 2 Document in Chart   Glucose, capillary     Status: Abnormal   Collection Time: 05/27/18  6:21 PM  Result Value Ref Range   Glucose-Capillary 461 (H) 70 - 99 mg/dL  Glucose, capillary     Status: Abnormal   Collection Time: 05/27/18  8:53 PM  Result Value Ref Range   Glucose-Capillary 141 (H) 70 - 99 mg/dL  Glucose, capillary     Status: Abnormal   Collection Time: 05/28/18  5:59 AM  Result Value Ref Range   Glucose-Capillary 277 (H) 70 - 99 mg/dL   Comment 1 Notify RN    Comment 2 Document in Chart   Glucose, capillary     Status: Abnormal   Collection Time: 05/28/18 12:06 PM  Result Value Ref Range   Glucose-Capillary 257 (H) 70 - 99 mg/dL  Glucose, capillary     Status: Abnormal   Collection Time: 05/28/18  5:19 PM  Result Value  Ref Range   Glucose-Capillary 191 (H) 70 - 99 mg/dL   Comment 1 Notify RN    Comment 2 Document in Chart   Glucose, capillary     Status: Abnormal   Collection Time: 05/28/18  9:08 PM  Result Value Ref Range   Glucose-Capillary 169 (H) 70 - 99 mg/dL  Glucose, capillary     Status: Abnormal   Collection Time: 05/29/18  6:03 AM  Result Value Ref Range   Glucose-Capillary 442 (H) 70 - 99 mg/dL   Comment 1 Notify RN     Blood Alcohol level:  Lab Results  Component Value Date   ETH <10 05/24/2018   ETH <10 95/28/4132    Metabolic Disorder Labs: Lab Results  Component Value Date   HGBA1C 10.6 (H) 05/26/2018   MPG 257.52 05/26/2018   MPG 280.48 01/25/2018   No results found for: PROLACTIN Lab Results  Component Value Date   CHOL 215 (H) 05/23/2014   TRIG 135 05/23/2014   HDL 67 05/23/2014   CHOLHDL 3.2 05/23/2014   VLDL 27 05/23/2014   LDLCALC 121 (H) 05/23/2014   LDLCALC 101 (H) 06/01/2009    Physical Findings: AIMS: Facial and Oral Movements Muscles of Facial Expression: None, normal Lips and Perioral Area: None, normal Jaw: None, normal Tongue: None, normal,Extremity Movements Upper (arms, wrists, hands, fingers): None, normal Lower (legs, knees, ankles, toes): None, normal, Trunk Movements Neck, shoulders, hips: None, normal, Overall Severity Severity of abnormal movements (highest score from questions above): None, normal Incapacitation due to abnormal movements: None, normal Patient's awareness of abnormal movements (rate only patient's report): No Awareness, Dental Status Current problems with teeth and/or dentures?: No Does patient usually wear dentures?: No  CIWA:    COWS:     Musculoskeletal: Strength & Muscle Tone: within normal limits Gait &  Station: normal Patient leans: N/A  Psychiatric Specialty Exam: Physical Exam  Nursing note and vitals reviewed. Constitutional: He is oriented to person, place, and time. He appears well-developed and  well-nourished.  HENT:  Head: Normocephalic and atraumatic.  Respiratory: Effort normal.  Neurological: He is alert and oriented to person, place, and time.    ROS mild headache, no chest pain, no shortness of breath, no vomiting , no diarrhea  Blood pressure 131/74, pulse 79, temperature 98.3 F (36.8 C), resp. rate 16, height 5' 10"  (1.778 m), weight 70 kg, SpO2 98 %.Body mass index is 22.14 kg/m.  General Appearance: Casual  Eye Contact:  Good  Speech:  Normal Rate  Volume:  Normal  Mood:  remains vaguely depressed but reports feeling better compared to admission  Affect:  constricted, but smiles at times appropriately   Thought Process:  Linear and Descriptions of Associations: Intact  Orientation:  Full (Time, Place, and Person)  Thought Content:  no hallucinations, no delusions, not internally preoccupied   Suicidal Thoughts:  No- denies suicidal or self injurious ideations, denies homicidal or violent ideations  Homicidal Thoughts:  No  Memory:  recent and remote grossly intact   Judgement:  Other:  improving   Insight:  Fair/ improving   Psychomotor Activity:  Normal  Concentration:  Concentration: Good and Attention Span: Good  Recall:  Good  Fund of Knowledge:  Good  Language:  Good  Akathisia:  Negative  Handed:  Right  AIMS (if indicated):     Assets:  Communication Skills Desire for Improvement Housing Physical Health Resilience Social Support Talents/Skills  ADL's:  Intact  Cognition:  WNL  Sleep:  Number of Hours: 6.75   Assessment - 47 year old male, employed, lives with mother. History of DM I, Depression, Substance Use Disorder ( identifies cocaine as substance of choice) . Admitted to inpatient medical unit initially, after being found unresponsive by mother with severe hypoglycemia. Seen by psychiatric consultant, who recommended inpatient psychiatric admission due to worsening depression and SI. Patient reports partially improved mood, denies  suicidal ideations, and presents future oriented at this time.  Reports sleep remains fair. Will increase Trazodone PRN to 100 mgrs QHS  Treatment Plan Summary: Treatment Plan reviewed as below today 9/28 Encourage group and milieu participation to work on coping skills and symptom reduction Encourage efforts to work on sobriety and relapse prevention efforts  Treatment Team working on disposition planning options Continue Prozac 20 mgrs QDAY for depression, anxiety Continue Neurontin 100 mgrs TID for anxiety, peripheral neuropathy Increase Trazodone to 100 mgrs QHS PRN for anxiety  Continue DM management - will consult Diabetes Coordinator for further management       Jenne Campus, MD 05/29/2018, 8:59 AM  Patient ID: John Cox, male   DOB: Jul 01, 1971, 48 y.o.   MRN: [id number]

## 2018-05-29 NOTE — [facility] Group Notes (Signed)
[facility] Group Notes:  (Nursing/MHT/Case Management/Adjunct)  Date:  05/29/2018  Time:  1:30 PM  Type of Therapy:  Nurse Education  Participation Level:  Active  Participation Quality:  Appropriate and Supportive  Affect:  Appropriate  Cognitive:  Appropriate  Insight:  Appropriate  Engagement in Group:  Lacking and Supportive  Modes of Intervention:  Discussion and Support  Summary of Progress/Problems: Patient participated in group when asked questions, but mostly kept himself.  Dewayne Shorter 05/29/2018, 2:51 PM

## 2018-05-29 NOTE — [facility] Group Notes (Signed)
LCSW Group Therapy Note  05/29/2018    9:00 - 10:15 AM               Type of Therapy and Topic:  Group Therapy: Anger Cues and Responses  Participation Level:  Minimal / None  In this group, patients learned how to recognize the physical, cognitive, emotional, and behavioral responses they have to anger-provoking situations.  They identified a recent time they became angry and how they reacted.  They analyzed how their reaction was possibly beneficial and how it was possibly unhelpful.  The group discussed anger warning signs and how to know when our anger can potentially become a problem. The group will discuss the cycle of anger and how our thoughts impact our feelings which in result affect our behaviors. Patients will explore alternative emotions in addition to feeling anger. Patients will share with the group and learn from CSW but also other patients coping skills that work for others.   Therapeutic Goals: 1. Patients will remember their last incident of anger and how they felt emotionally and physically, what their thoughts were at the time, and how they behaved. 2. Patients will identify how their behavior at that time worked for them, as well as how it worked against them. 3. Patients will explore how their body, mind and feelings play a role with anger. 4. Patients will learn that anger itself is normal and cannot be eliminated, and that healthier reactions can assist with resolving conflict rather than worsening situations. 5. Patients will learn the causes of anger and education on the negative implications anger can have on [place] overtime.  Summary of Patient Progress:  Patient was late entering group. Pt shared his anger often gets him in trouble. Pt was silent most of group and left early.   Therapeutic Modalities:   Cognitive Behavioral Therapy  Shellia Cleverly, LCSW

## 2018-05-29 NOTE — Plan of Care (Signed)
Progress Note  D: pt found in bed; pt compliant with medication administration. Pt states he slept fairly well. Pt rates his depression/hopelessness/anxiety a 3/3/5 out of 10 respectively. Pt denies any physical symptoms or physical pain, rating this a 0/10. Pt states his goal for today is to be positive. Pt denies any si/hi/ah/vh and verbally agrees to approach staff if these become apparent or before harming himself while at [facility]. A: pt provided support and encouragement. Pt given medications per protocol and standing orders. Q54m safety checks implemented and continued.  R: pt safe on the unit. Will continue to monitor.  Pt progressing in the following metrics  Problem: Coping: Goal: Ability to demonstrate self-control will improve Outcome: Progressing   Problem: Health Behavior/Discharge Planning: Goal: Identification of resources available to assist in meeting health care needs will improve Outcome: Progressing Goal: Compliance with treatment plan for underlying cause of condition will improve Outcome: Progressing   Problem: Physical Regulation: Goal: Ability to maintain clinical measurements within normal limits will improve Outcome: Progressing   Problem: Safety: Goal: Periods of time without injury will increase Outcome: Progressing

## 2018-05-30 LAB — GLUCOSE, CAPILLARY
GLUCOSE-CAPILLARY: 211 mg/dL — AB (ref 70–99)
Glucose-Capillary: 448 mg/dL — ABNORMAL HIGH (ref 70–99)
Glucose-Capillary: 286 mg/dL — ABNORMAL HIGH (ref 70–99)
Glucose-Capillary: 377 mg/dL — ABNORMAL HIGH (ref 70–99)
Glucose-Capillary: 265 mg/dL — ABNORMAL HIGH (ref 70–99)

## 2018-05-30 MED ORDER — INSULIN ASPART 100 UNIT/ML ~~LOC~~ SOLN
0.0000 [IU] | Freq: Three times a day (TID) | SUBCUTANEOUS | Status: DC
  Administered 2018-05-30: 5 [IU] via SUBCUTANEOUS
  Administered 2018-05-31: 9 [IU] via SUBCUTANEOUS
  Administered 2018-05-31: 1 [IU] via SUBCUTANEOUS
  Administered 2018-05-31 – 2018-06-03 (×6): 9 [IU] via SUBCUTANEOUS
  Administered 2018-06-03: 3 [IU] via SUBCUTANEOUS

## 2018-05-30 MED ORDER — INSULIN ASPART PROT & ASPART (70-30 MIX) 100 UNIT/ML ~~LOC~~ SUSP
25.0000 [IU] | Freq: Two times a day (BID) | SUBCUTANEOUS | Status: DC
  Administered 2018-05-30 – 2018-06-02 (×6): 25 [IU] via SUBCUTANEOUS

## 2018-05-30 MED ORDER — INSULIN ASPART 100 UNIT/ML ~~LOC~~ SOLN
0.0000 [IU] | Freq: Every day | SUBCUTANEOUS | Status: DC
  Administered 2018-06-02: 2 [IU] via SUBCUTANEOUS

## 2018-05-30 NOTE — Progress Notes (Signed)
D    Pt is pleasant on approach and cooperative   He attends and participates in groups and his behavior is appropriate  A   Verbal support given   Discussed diabetes and changes to insulin regimen  with patient and pt agreed to let staff know if he feels like his blood sugar is low also his blood sugar will be checked at 230 in the am   Pt did eat a snack and did not receive any HS coverage  R    Pt is safe and receptive to teaching

## 2018-05-30 NOTE — Progress Notes (Signed)
Nutrition Note  RD consulted for diet education regarding diabetes. Pt with Type 1 diabetes. CBGs currently ranging between 79-496 mg/dL. HgbA1c: 10.6  Pt has had Type 1 diabetes for years. Has had multiple educations in the past. Pt now more motivated to follow diet. Left diabetes education tips in discharge instructions for patient to review.   John Franco, MS, RD, LDN [facility] Inpatient Clinical Dietitian Pager: [phone] After Hours Pager: [phone]

## 2018-05-30 NOTE — Plan of Care (Signed)
Progress note  D: pt found in bed; pt requested to sleep long. Pt compliant with medication administration. Pt states he slept fair. Pt rates his depression/hopelessness/anxiety a 8/7/8 out of 10 respectively. Pt denies any physical ailments, but does complain or lightheadedness accompanied by a headache. Pt failed to rate this on his self inventory and denied any pain to this writer, rating this a 0/10. Pt doesn't have a goal for today. Pt denies any si/hi/ah/vh and verbally agrees to approach staff if these become apparent or before harming himself while at [facility]. A: pt provided support and encouragement. Pt given medication per protocol and standing orders. Q68m safety checks implemented and continued.  R: pt safe on the unit. Will continue to monitor.  Pt progressing in the following metrics  Problem: Activity: Goal: Interest or engagement in activities will improve Outcome: Progressing   Problem: Health Behavior/Discharge Planning: Goal: Identification of resources available to assist in meeting health care needs will improve Outcome: Progressing   Problem: Health Behavior/Discharge Planning: Goal: Ability to identify changes in lifestyle to reduce recurrence of condition will improve Outcome: Progressing Goal: Identification of resources available to assist in meeting health care needs will improve Outcome: Progressing   Problem: Physical Regulation: Goal: Complications related to the disease process, condition or treatment will be avoided or minimized Outcome: Progressing

## 2018-05-30 NOTE — [facility] Group Notes (Signed)
[facility] Group Notes:  (Nursing/MHT/Case Management/Adjunct)  Date:  05/30/2018  Time:  1:15 PM  Type of Therapy:  Nurse Education  Participation Level:  Active  Participation Quality:  Appropriate and Attentive  Affect:  Appropriate  Cognitive:  Alert and Appropriate  Insight:  Appropriate  Engagement in Group:  Improving  Modes of Intervention:  Discussion and Education  Summary of Progress/Problems: pt discussed healthy and unhealthy support systems and how to utilize positive ones for change.    Suszanne Conners Kaysey Berndt 05/30/2018, 5:37 PM

## 2018-05-30 NOTE — Progress Notes (Signed)
Patient did attend the evening speaker AA meeting.  

## 2018-05-30 NOTE — Progress Notes (Addendum)
[facility] MD Progress Note  05/30/2018 4:33 PM JAYZIAH BANKHEAD  MRN:  [id number] Subjective: patient reports improving mood. States he is feeling better. Denies suicidal ideations . Denies medication side effects. Reports mild rhinorrhea, nasal congestion today.  Objective: I have reviewed chart notes and have met with patient. 47 year old male, employed, lives with mother. History of DM I, Depression, Substance Use Disorder ( identifies cocaine as substance of choice) . Admitted to inpatient medical unit initially, after being found unresponsive by mother with severe hypoglycemia. Seen by psychiatric consultant, who recommended inpatient psychiatric admission due to worsening depression and SI. Patient reports improving mood , feeling better than on admission. Denies suicidal ideations. Reports some anxiety regarding disposition planning , states that his mother will not allow him to return home at this time so is currently homeless. States he may go to a shelter at discharge. Denies medication side effects.  No disruptive or agitated behaviors on unit . Some group participation, cooperative on approach. CBG today 286.   Principal Problem: MDD, Cocaine Use Disorder  Diagnosis:   Patient Active Problem List   Diagnosis Date Noted  . MDD (major depressive disorder), severe ([place]) [F32.2] 05/27/2018  . Tachy-brady syndrome ([place]) [I49.5] 05/26/2018  . Hypoglycemia [E16.2] 05/24/2018  . DKA, type 1 ([place]) [E10.10] 03/31/2018  . Hyperglycemia due to type 1 diabetes mellitus ([place]) [E10.65] 02/23/2018  . Cocaine dependence with cocaine-induced mood disorder ([place]) [F14.24]   . MDD (major depressive disorder), recurrent severe, without psychosis ([place]) [F33.2] 02/22/2018  . Malnutrition of moderate degree [E44.0] 07/19/2015  . AKI (acute kidney injury) ([place]) [N17.9] 07/18/2015  . Marijuana abuse [F12.10] 07/18/2015  . Dehydration [E86.0] 07/18/2015  . Dental caries [K02.9] 07/18/2015  . Hyperlipidemia  associated with type 2 diabetes mellitus ([place]) [E11.69, E78.5] 05/24/2014  . Polysubstance abuse ([place]) [F19.10] 05/13/2014  . Major depression, recurrent ([place]) [F33.9] 05/13/2014  . Suicidal ideation [R45.851] 05/13/2014  . ANTICARDIOLIPIN ANTIBODY SYNDROME [R89.4] 05/12/2007  . Type 1 diabetes mellitus ([place]) [E10.9] 04/26/2007  . TOBACCO ABUSE [F17.200] 04/26/2007  . 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 AKI (acute kidney injury) ([place]) 01/25/2018  . Anxiety   . Bipolar disorder ([place])   . Depression 2000  . Diabetes mellitus 1995  . Hyperglycemia 01/25/2018  . Nausea & vomiting   . Polysubstance abuse ([place]) 2012   relapse 2 wk ago   . Suicidal ideation     Past Surgical History:  Procedure Laterality Date  . NO PAST SURGERIES     Family History:  Family History  Problem Relation Age of Onset  . Diabetes Mother   . COPD Mother   . Heart disease Mother    Family Psychiatric  History: See admission H&P Social History:  Social History   Substance and Sexual Activity  Alcohol Use Yes  . Alcohol/week: 1.0 standard drinks  . Types: 1 Cans of beer per week   Comment: one beer weekly     Social History   Substance and Sexual Activity  Drug Use Yes  . Types: Marijuana, Cocaine   Comment: THC daily, Cocaine daily    Social History   Socioeconomic History  . Marital status: Single    Spouse name: Not on file  . Number of children: Not on file  . Years of education: Not on file  . Highest education  level: Not on file  Occupational History  . Not on file  Social Needs  . Financial resource strain: Not on file  . Food insecurity:    Worry: Not on file    Inability: Not on file  . Transportation needs:    Medical: Not on file    Non-medical: Not on file  Tobacco Use  . Smoking status: Current Every Day  Smoker    Packs/day: 1.00    Years: 25.00    Pack years: 25.00    Types: Cigarettes  . Smokeless tobacco: Never Used  Substance and Sexual Activity  . Alcohol use: Yes    Alcohol/week: 1.0 standard drinks    Types: 1 Cans of beer per week    Comment: one beer weekly  . Drug use: Yes    Types: Marijuana, Cocaine    Comment: THC daily, Cocaine daily  . Sexual activity: Not Currently  Lifestyle  . Physical activity:    Days per week: Not on file    Minutes per session: Not on file  . Stress: Not on file  Relationships  . Social connections:    Talks on phone: Not on file    Gets together: Not on file    Attends religious service: Not on file    Active member of club or organization: Not on file    Attends meetings of clubs or organizations: Not on file    Relationship status: Not on file  Other Topics Concern  . Not on file  Social History Narrative  . Not on file   Additional Social History:   Sleep: Fair  Appetite:  Good  Current Medications: Current Facility-Administered Medications  Medication Dose Route Frequency Provider Last Rate Last Dose  . acetaminophen (TYLENOL) tablet 650 mg  650 mg Oral Q6H PRN Sharma Covert, MD   650 mg at 05/29/18 1932  . alum & mag hydroxide-simeth (MAALOX/MYLANTA) 200-200-20 MG/5ML suspension 30 mL  30 mL Oral Q4H PRN Sharma Covert, MD      . cyclobenzaprine (FLEXERIL) tablet 5 mg  5 mg Oral TID PRN Sharma Covert, MD   5 mg at 05/29/18 1934  . FLUoxetine (PROZAC) capsule 20 mg  20 mg Oral Daily Sharma Covert, MD   20 mg at 05/30/18 1158  . gabapentin (NEURONTIN) capsule 100 mg  100 mg Oral TID Sharma Covert, MD   100 mg at 05/30/18 1158  . hydrOXYzine (ATARAX/VISTARIL) tablet 25 mg  25 mg Oral TID PRN Sharma Covert, MD   25 mg at 05/29/18 1932  . ibuprofen (ADVIL,MOTRIN) tablet 600 mg  600 mg Oral Q6H PRN Laverle Hobby, PA-C   600 mg at 05/29/18 2122  . insulin aspart (novoLOG) injection 0-15 Units  0-15  Units Subcutaneous TID WC Derrill Center, NP   15 Units at 05/30/18 1158  . insulin aspart (novoLOG) injection 0-5 Units  0-5 Units Subcutaneous QHS Sharma Covert, MD      . insulin aspart protamine- aspart (NOVOLOG MIX 70/30) injection 20 Units  20 Units Subcutaneous BID WC Sharma Covert, MD   20 Units at 05/30/18 0539  . magnesium hydroxide (MILK OF MAGNESIA) suspension 30 mL  30 mL Oral Daily PRN Sharma Covert, MD      . nicotine (NICODERM CQ - dosed in mg/24 hours) patch 21 mg  21 mg Transdermal Daily Sharma Covert, MD   21 mg at 05/28/18 0757  . traZODone (DESYREL) tablet  100 mg  100 mg Oral QHS PRN Cobos, Myer Peer, MD   100 mg at 05/29/18 2102    Lab Results:  Results for orders placed or performed during the hospital encounter of 05/27/18 (from the past 48 hour(s))  Glucose, capillary     Status: Abnormal   Collection Time: 05/28/18  5:19 PM  Result Value Ref Range   Glucose-Capillary 191 (H) 70 - 99 mg/dL   Comment 1 Notify RN    Comment 2 Document in Chart   Glucose, capillary     Status: Abnormal   Collection Time: 05/28/18  9:08 PM  Result Value Ref Range   Glucose-Capillary 169 (H) 70 - 99 mg/dL  Glucose, capillary     Status: Abnormal   Collection Time: 05/29/18  6:03 AM  Result Value Ref Range   Glucose-Capillary 442 (H) 70 - 99 mg/dL   Comment 1 Notify RN   Glucose, capillary     Status: Abnormal   Collection Time: 05/29/18  9:15 AM  Result Value Ref Range   Glucose-Capillary 343 (H) 70 - 99 mg/dL  Glucose, capillary     Status: Abnormal   Collection Time: 05/29/18 12:06 PM  Result Value Ref Range   Glucose-Capillary 190 (H) 70 - 99 mg/dL  Glucose, capillary     Status: Abnormal   Collection Time: 05/29/18  4:43 PM  Result Value Ref Range   Glucose-Capillary 443 (H) 70 - 99 mg/dL  Glucose, capillary     Status: Abnormal   Collection Time: 05/29/18  7:29 PM  Result Value Ref Range   Glucose-Capillary 235 (H) 70 - 99 mg/dL  Glucose,  capillary     Status: Abnormal   Collection Time: 05/29/18  9:06 PM  Result Value Ref Range   Glucose-Capillary 152 (H) 70 - 99 mg/dL  Glucose, capillary     Status: Abnormal   Collection Time: 05/30/18  5:30 AM  Result Value Ref Range   Glucose-Capillary 448 (H) 70 - 99 mg/dL   Comment 1 Notify RN   Glucose, capillary     Status: Abnormal   Collection Time: 05/30/18  7:34 AM  Result Value Ref Range   Glucose-Capillary 286 (H) 70 - 99 mg/dL  Glucose, capillary     Status: Abnormal   Collection Time: 05/30/18 11:53 AM  Result Value Ref Range   Glucose-Capillary 377 (H) 70 - 99 mg/dL    Blood Alcohol level:  Lab Results  Component Value Date   ETH <10 05/24/2018   ETH <10 04/14/4817    Metabolic Disorder Labs: Lab Results  Component Value Date   HGBA1C 10.6 (H) 05/26/2018   MPG 257.52 05/26/2018   MPG 280.48 01/25/2018   No results found for: PROLACTIN Lab Results  Component Value Date   CHOL 215 (H) 05/23/2014   TRIG 135 05/23/2014   HDL 67 05/23/2014   CHOLHDL 3.2 05/23/2014   VLDL 27 05/23/2014   LDLCALC 121 (H) 05/23/2014   LDLCALC 101 (H) 06/01/2009    Physical Findings: AIMS: Facial and Oral Movements Muscles of Facial Expression: None, normal Lips and Perioral Area: None, normal Jaw: None, normal Tongue: None, normal,Extremity Movements Upper (arms, wrists, hands, fingers): None, normal Lower (legs, knees, ankles, toes): None, normal, Trunk Movements Neck, shoulders, hips: None, normal, Overall Severity Severity of abnormal movements (highest score from questions above): None, normal Incapacitation due to abnormal movements: None, normal Patient's awareness of abnormal movements (rate only patient's report): No Awareness, Dental Status Current problems with teeth  and/or dentures?: No Does patient usually wear dentures?: No  CIWA:    COWS:     Musculoskeletal: Strength & Muscle Tone: within normal limits Gait & Station: normal Patient leans:  N/A  Psychiatric Specialty Exam: Physical Exam  Nursing note and vitals reviewed. Constitutional: He is oriented to person, place, and time. He appears well-developed and well-nourished.  HENT:  Head: Normocephalic and atraumatic.  Respiratory: Effort normal.  Neurological: He is alert and oriented to person, place, and time.    ROS no  headache, reports nasal congestion today,no chest pain, no shortness of breath, no vomiting , no diarrhea  Blood pressure 131/74, pulse 79, temperature 98.3 F (36.8 C), resp. rate 16, height 5' 10"  (1.778 m), weight 70 kg, SpO2 98 %.Body mass index is 22.14 kg/m.  General Appearance: Casual  Eye Contact:  Good  Speech:  Normal Rate  Volume:  Normal  Mood:  reports partially improved mood, remains vaguely depressed but states he feels better  Affect:  more reactive, smiles at times appropriately   Thought Process:  Linear and Descriptions of Associations: Intact  Orientation:  Full (Time, Place, and Person)  Thought Content:  no hallucinations, no delusions, not internally preoccupied   Suicidal Thoughts:  No- denies suicidal or self injurious ideations, denies homicidal or violent ideations  Homicidal Thoughts:  No  Memory:  recent and remote grossly intact   Judgement:  Other:  improving   Insight:  Fair/ improving   Psychomotor Activity:  Normal  Concentration:  Concentration: Good and Attention Span: Good  Recall:  Good  Fund of Knowledge:  Good  Language:  Good  Akathisia:  Negative  Handed:  Right  AIMS (if indicated):     Assets:  Communication Skills Desire for Improvement Housing Physical Health Resilience Social Support Talents/Skills  ADL's:  Intact  Cognition:  WNL  Sleep:  Number of Hours: 5.75   Assessment - 47 year old male, employed, lives with mother. History of DM I, Depression, Substance Use Disorder ( identifies cocaine as substance of choice) . Admitted to inpatient medical unit initially, after being found  unresponsive by mother with severe hypoglycemia. Seen by psychiatric consultant, who recommended inpatient psychiatric admission due to worsening depression and SI. Patient reports partially improved mood, but remains vaguely depressed , with constricted but more reactive affect. Denies suicidal ideations.  Reports he had been living with mother prior to admission but cannot return there after discharge.  DM coordinator has evaluated patient- no current medication changes/recommend 2 AM  CBG to insure patient is not presenting with early AM hypoglycemic episodes and rebound hyperglycemias ( Somogy effect )  Treatment Plan Summary: Treatment Plan reviewed as below today 9/29 Encourage group and milieu participation to work on coping skills and symptom reduction Encourage efforts to work on sobriety and relapse prevention efforts  Treatment Team working on disposition planning options Continue Prozac 20 mgrs QDAY for depression, anxiety Continue Neurontin 100 mgrs TID for anxiety, peripheral neuropathy Continue Trazodone  100 mgrs QHS PRN for anxiety  Continue DM management - have consulted  with Dr. Sherrian Divers, hospitalist, for DM management recommendations. Also will order 2 AM CBG as requested by DM  . DM Coordinator following for recommendations.     Jenne Campus, MD 05/30/2018, 4:33 PM  Patient ID: Kela Millin, male   DOB: Jun 30, 1971, 47 y.o.   MRN: [id number]

## 2018-05-30 NOTE — Progress Notes (Signed)
D: Pt was at nurse's station upon initial approach.  Pt presents with anxious, depressed affect and mood.  When asked how he is doing today, he reports he "could be doing better."  Pt discussed how "I have some issues going on at the house, I thought about leaving, but I'm going to stay."  Pt denies SI/HI, denies hallucinations, reports pain from headache of 7/10.  Pt has been visible in milieu interacting with peers and staff appropriately.  Pt attended evening group.    A: Met with pt 1:1.  Actively listened to pt and offered support and encouragement.  PRN medication administered for anxiety, muscle spasms, sleep, and pain.  Fall prevention techniques reviewed with pt and he verbalized understanding.  Q15 minute safety checks maintained.  R: Pt is safe on the unit.  Pt is compliant with medications.  Pt verbally contracts for safety.  Will continue to monitor and assess.

## 2018-05-30 NOTE — Plan of Care (Signed)
  Problem: Activity: Goal: Sleeping patterns will improve Outcome: Progressing Note:  Pt slept 6.75 hours last night per flowsheet.    

## 2018-05-30 NOTE — [facility] Group Notes (Signed)
[facility] Group Notes: (Clinical Social Work)   05/30/2018      Type of Therapy:  Group Therapy   Participation Level:  Did Not Attend despite MHT prompting   Shellia Cleverly, LCSW  05/30/2018 3:20 PM

## 2018-05-30 NOTE — Progress Notes (Signed)
   05/30/18 0531  Provider Notification  Reason for Communication Critical lab value  Provider Name Donell Sievert  Provider Role Physician assistant  Method of Communication Face to face  Response Other (Comment) (see note)  Notification Time 0531   CBG of 448 mg/dL at 1610.  On-site provider instructed writer to administer Novolog 15 units and Novolog 70/30 20 units now.  Medications administered.  Per provider, CBG is to be rechecked 2 hours after insulin administered (which was at 0539).

## 2018-05-30 NOTE — Discharge Instructions (Signed)
Tips for Eating Healthy With Diabetes:  Eat Less Saturated Fat  Eat baked, broiled, or stewed fish and meats instead of fried.  Use nonfat or low-fat salad dressing, mayo, and margarine.  Try a food lower in fat in a favorite dish--for example, make macand cheese with fat-free or low-fat cheese and milk.  Eat Less Sugar  Drink water, sugar-free soda, or unsweetened iced tea instead of fruit drinks,  regular soda, or sweet tea.  Keep cold water in the fridge.  Share dessert with someone else when youre eating out, instead of having a whole dessert.  Eat Healthy Portions  When eating out, share a meal with someone else or put half in a box to take home.  Eat slowly and take a break between bites.  Do not skip meals--when you skip a meal, its easy to overeat at the next meal.  Eat well  Choose foods that are lower in calories, saturated fat,trans fat, sugar, and salt.  Eat foods with more fiber, such as whole grain cereals, breads, crackers, rice, or pasta.  Choose foods such as fruits, vegetables, whole grains, bread and cereals, and low-fat or skim milk and cheese.  Drink water instead of juice and regular soda.

## 2018-05-30 NOTE — Treatment Plan (Signed)
Received call from [facility] regarding issues with blood glucose today. Chart reviewed. Patient known to me from recent admission. On recent admission, there were concerns that patient's presenting hypoglycemia was result of inappropriate PO intake while still taking insulin in a type 1 diabetic. While on medical service, glucose had remained stable and controlled with 15 units 70/30. Noted to have a low of 79 while still on medical service prior to transfer to [facility]. Of note, patient's diet was provided in a controlled setting at [facility].   Lately at [facility], patient noted to have glucose into the 200-400's. Discussed with Dr. Jama Flavors. Concerns for buffet-style eating arrangement where it would be more difficult to control carb intake. For now, recommend increasing 70-30 to 25 units, decrease SSI coverage to sensitive scale, continue to encourage PO. Would ask Diabetic Coordinator to follow up in AM.

## 2018-05-31 LAB — GLUCOSE, CAPILLARY
GLUCOSE-CAPILLARY: 343 mg/dL — AB (ref 70–99)
GLUCOSE-CAPILLARY: 462 mg/dL — AB (ref 70–99)
GLUCOSE-CAPILLARY: 378 mg/dL — AB (ref 70–99)
GLUCOSE-CAPILLARY: 175 mg/dL — AB (ref 70–99)

## 2018-05-31 NOTE — [facility] Group Notes (Signed)
LCSW Group Therapy Note 05/31/2018 2:01 PM  Type of Therapy and Topic: Group Therapy: Overcoming Obstacles  Participation Level: Active  Description of Group:  In this group patients will be encouraged to explore what they see as obstacles to their own wellness and recovery. They will be guided to discuss their thoughts, feelings, and behaviors related to these obstacles. The group will process together ways to cope with barriers, with attention given to specific choices patients can make. Each patient will be challenged to identify changes they are motivated to make in order to overcome their obstacles. This group will be process-oriented, with patients participating in exploration of their own experiences as well as giving and receiving support and challenge from other group members.  Therapeutic Goals: 1. Patient will identify personal and current obstacles as they relate to admission. 2. Patient will identify barriers that currently interfere with their wellness or overcoming obstacles.  3. Patient will identify feelings, thought process and behaviors related to these barriers. 4. Patient will identify two changes they are willing to make to overcome these obstacles:   Summary of Patient Progress John Cox reports that his main obstacle continues to be housing. John Cox reports that he would like housing resources so that he can build a stronger plan prior to discharge    Therapeutic Modalities:  Cognitive Behavioral Therapy Solution Focused Therapy Motivational Interviewing Relapse Prevention Therapy   Alcario Drought Clinical Social Worker

## 2018-05-31 NOTE — Progress Notes (Signed)
Recreation Therapy Notes  Date: 9.30.19 Time: 0930 Location: [address] Dayroom  Group Topic: Stress Management  Goal Area(s) Addresses:  Patient will verbalize importance of using healthy stress management.  Patient will identify positive emotions associated with healthy stress management.   Intervention: Stress Management  Activity :  Guided Imagery.  LRT introduced the stress management technique of guided imagery.  LRT read a script that guided patients a walk through a wildlife sanctuary.  Patients were to listen and follow along as the script was read.  Education:  Stress Management, Discharge Planning.   Education Outcome: Acknowledges edcuation/In group clarification offered/Needs additional education  Clinical Observations/Feedback: Pt did not attend group.     Burnadette Baskett, LRT/CTRS         Indie Nickerson A 05/31/2018 11:36 AM 

## 2018-05-31 NOTE — Progress Notes (Signed)
D    Pt is pleasant on approach and cooperative   He attends and participates in groups and his behavior is appropriate  A   Verbal support given   Discussed diabetes and changes to insulin regimen  with patient and pt agreed to let staff know if he feels like his blood sugar is low   Pt did eat a snack and did not receive any HS coverage   Pt is choosing to get ice cream and other sweet snacks  R    Pt is safe and receptive to teaching

## 2018-05-31 NOTE — Progress Notes (Addendum)
[facility] MD Progress Note  05/31/2018 4:52 PM John Cox  MRN:  [id number]    Evaluation: John Cox is awake alert oriented x3.  Seen resting in bed.  Continues to deny suicidal or homicidal ideations.  Rates his depression 5-10 with 10 being the worst Patient has expressed concerns about discharging on tomorrow.  Reports he was recently advised that he will not be able to stay where he was residing prior to admission.  Reports mild depression and stress related to discharge disposition.  Social work to follow-up with residential treatment resources.   As reported John Cox  has pending court case and maybe able to follow-up with  Residential treatment after his court case has resolved.  Social worker will provide resources for [employer]. Denies auditory or visual hallucinations.  Reports attending group sessions over the weekend.  Reports he is taking his medications as prescribed and tolerating them well.  See chart for updated diabetic consult recommendations. Support encouragement reassurance was provided   History: Per assessment note:Admitted to inpatient medical unit initially, after being found unresponsive by mother with severe hypoglycemia. Seen by psychiatric consultant, who recommended inpatient psychiatric admission due to worsening depression and SI. Patient reports improving mood , feeling better than on admission. Denies suicidal ideations. Reports some anxiety regarding disposition planning , states that his mother will not allow him to return home at this time so is currently homeless. States he may go to a shelter at discharge. Denies medication side effects.  No disruptive or agitated behaviors on unit . Some group participation, cooperative on approach.CBG today 286.   Principal Problem: MDD, Cocaine Use Disorder  Diagnosis:   Patient Active Problem List   Diagnosis Date Noted  . MDD (major depressive disorder), severe (HCC) [F32.2] 05/27/2018  . Tachy-brady syndrome (HCC)  [I49.5] 05/26/2018  . Hypoglycemia [E16.2] 05/24/2018  . DKA, type 1 (HCC) [E10.10] 03/31/2018  . Hyperglycemia due to type 1 diabetes mellitus (HCC) [E10.65] 02/23/2018  . Cocaine dependence with cocaine-induced mood disorder (HCC) [F14.24]   . MDD (major depressive disorder), recurrent severe, without psychosis (HCC) [F33.2] 02/22/2018  . Malnutrition of moderate degree [E44.0] 07/19/2015  . AKI (acute kidney injury) (HCC) [N17.9] 07/18/2015  . Marijuana abuse [F12.10] 07/18/2015  . Dehydration [E86.0] 07/18/2015  . Dental caries [K02.9] 07/18/2015  . Hyperlipidemia associated with type 2 diabetes mellitus (HCC) [E11.69, E78.5] 05/24/2014  . Polysubstance abuse (HCC) [F19.10] 05/13/2014  . Major depression, recurrent (HCC) [F33.9] 05/13/2014  . Suicidal ideation [R45.851] 05/13/2014  . ANTICARDIOLIPIN ANTIBODY SYNDROME [R89.4] 05/12/2007  . Type 1 diabetes mellitus (HCC) [E10.9] 04/26/2007  . TOBACCO ABUSE [F17.200] 04/26/2007  . 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 AKI (acute kidney injury) (HCC) 01/25/2018  . Anxiety   . Bipolar disorder (HCC)   . Depression 2000  . Diabetes mellitus 1995  . Hyperglycemia 01/25/2018  . Nausea & vomiting   . Polysubstance abuse (HCC) 2012   relapse 2 wk ago   . Suicidal ideation     Past Surgical History:  Procedure Laterality Date  . NO PAST SURGERIES     Family History:  Family History  Problem Relation Age of Onset  . Diabetes Mother   . COPD Mother   . Heart disease Mother    Family Psychiatric  History: See admission H&P Social History:  Social History  Substance and Sexual Activity  Alcohol Use Yes  . Alcohol/week: 1.0 standard drinks  . Types: 1 Cans of beer per week   Comment: one beer weekly     Social History   Substance and Sexual Activity  Drug Use  Yes  . Types: Marijuana, Cocaine   Comment: THC daily, Cocaine daily    Social History   Socioeconomic History  . Marital status: Single    Spouse name: Not on file  . Number of children: Not on file  . Years of education: Not on file  . Highest education level: Not on file  Occupational History  . Not on file  Social Needs  . Financial resource strain: Not on file  . Food insecurity:    Worry: Not on file    Inability: Not on file  . Transportation needs:    Medical: Not on file    Non-medical: Not on file  Tobacco Use  . Smoking status: Current Every Day Smoker    Packs/day: 1.00    Years: 25.00    Pack years: 25.00    Types: Cigarettes  . Smokeless tobacco: Never Used  Substance and Sexual Activity  . Alcohol use: Yes    Alcohol/week: 1.0 standard drinks    Types: 1 Cans of beer per week    Comment: one beer weekly  . Drug use: Yes    Types: Marijuana, Cocaine    Comment: THC daily, Cocaine daily  . Sexual activity: Not Currently  Lifestyle  . Physical activity:    Days per week: Not on file    Minutes per session: Not on file  . Stress: Not on file  Relationships  . Social connections:    Talks on phone: Not on file    Gets together: Not on file    Attends religious service: Not on file    Active member of club or organization: Not on file    Attends meetings of clubs or organizations: Not on file    Relationship status: Not on file  Other Topics Concern  . Not on file  Social History Narrative  . Not on file   Additional Social History:   Sleep: Fair  Appetite:  Good  Current Medications: Current Facility-Administered Medications  Medication Dose Route Frequency Provider Last Rate Last Dose  . acetaminophen (TYLENOL) tablet 650 mg  650 mg Oral Q6H PRN Antonieta Pert, MD   650 mg at 05/29/18 1932  . alum & mag hydroxide-simeth (MAALOX/MYLANTA) 200-200-20 MG/5ML suspension 30 mL  30 mL Oral Q4H PRN Antonieta Pert, MD      . cyclobenzaprine  (FLEXERIL) tablet 5 mg  5 mg Oral TID PRN Antonieta Pert, MD   5 mg at 05/29/18 1934  . FLUoxetine (PROZAC) capsule 20 mg  20 mg Oral Daily Antonieta Pert, MD   20 mg at 05/31/18 1217  . gabapentin (NEURONTIN) capsule 100 mg  100 mg Oral TID Antonieta Pert, MD   100 mg at 05/31/18 1217  . hydrOXYzine (ATARAX/VISTARIL) tablet 25 mg  25 mg Oral TID PRN Antonieta Pert, MD   25 mg at 05/30/18 2209  . ibuprofen (ADVIL,MOTRIN) tablet 600 mg  600 mg Oral Q6H PRN Kerry Hough, PA-C   600 mg at 05/30/18 2209  . insulin aspart (novoLOG) injection 0-5 Units  0-5 Units Subcutaneous QHS Jerald Kief, MD      . insulin aspart (novoLOG) injection 0-9 Units  0-9 Units Subcutaneous TID  WC Jerald Kief, MD   1 Units at 05/31/18 1218  . insulin aspart protamine- aspart (NOVOLOG MIX 70/30) injection 25 Units  25 Units Subcutaneous BID WC Jerald Kief, MD   25 Units at 05/31/18 [id number]  . magnesium hydroxide (MILK OF MAGNESIA) suspension 30 mL  30 mL Oral Daily PRN Antonieta Pert, MD      . nicotine (NICODERM CQ - dosed in mg/24 hours) patch 21 mg  21 mg Transdermal Daily Antonieta Pert, MD   21 mg at 05/28/18 0757  . traZODone (DESYREL) tablet 100 mg  100 mg Oral QHS PRN Mikiah Demond, Rockey Situ, MD   100 mg at 05/30/18 2209    Lab Results:  Results for orders placed or performed during the hospital encounter of 05/27/18 (from the past 48 hour(s))  Glucose, capillary     Status: Abnormal   Collection Time: 05/29/18  7:29 PM  Result Value Ref Range   Glucose-Capillary 235 (H) 70 - 99 mg/dL  Glucose, capillary     Status: Abnormal   Collection Time: 05/29/18  9:06 PM  Result Value Ref Range   Glucose-Capillary 152 (H) 70 - 99 mg/dL  Glucose, capillary     Status: Abnormal   Collection Time: 05/30/18  5:30 AM  Result Value Ref Range   Glucose-Capillary 448 (H) 70 - 99 mg/dL   Comment 1 Notify RN   Glucose, capillary     Status: Abnormal   Collection Time: 05/30/18  7:34 AM  Result  Value Ref Range   Glucose-Capillary 286 (H) 70 - 99 mg/dL  Glucose, capillary     Status: Abnormal   Collection Time: 05/30/18 11:53 AM  Result Value Ref Range   Glucose-Capillary 377 (H) 70 - 99 mg/dL  Glucose, capillary     Status: Abnormal   Collection Time: 05/30/18  5:00 PM  Result Value Ref Range   Glucose-Capillary 265 (H) 70 - 99 mg/dL  Glucose, capillary     Status: Abnormal   Collection Time: 05/30/18  9:06 PM  Result Value Ref Range   Glucose-Capillary 211 (H) 70 - 99 mg/dL   Comment 1 Notify RN    Comment 2 Document in Chart   Glucose, capillary     Status: Abnormal   Collection Time: 05/31/18  2:07 AM  Result Value Ref Range   Glucose-Capillary 343 (H) 70 - 99 mg/dL   Comment 1 Notify RN    Comment 2 Document in Chart   Glucose, capillary     Status: Abnormal   Collection Time: 05/31/18  5:47 AM  Result Value Ref Range   Glucose-Capillary 462 (H) 70 - 99 mg/dL   Comment 1 Notify RN    Comment 2 Document in Chart     Blood Alcohol level:  Lab Results  Component Value Date   ETH <10 05/24/2018   ETH <10 02/22/2018    Metabolic Disorder Labs: Lab Results  Component Value Date   HGBA1C 10.6 (H) 05/26/2018   MPG 257.52 05/26/2018   MPG 280.48 01/25/2018   No results found for: PROLACTIN Lab Results  Component Value Date   CHOL 215 (H) 05/23/2014   TRIG 135 05/23/2014   HDL 67 05/23/2014   CHOLHDL 3.2 05/23/2014   VLDL 27 05/23/2014   LDLCALC 121 (H) 05/23/2014   LDLCALC 101 (H) 06/01/2009    Physical Findings: AIMS: Facial and Oral Movements Muscles of Facial Expression: None, normal Lips and Perioral Area: None, normal Jaw: None, normal Tongue:  None, normal,Extremity Movements Upper (arms, wrists, hands, fingers): None, normal Lower (legs, knees, ankles, toes): None, normal, Trunk Movements Neck, shoulders, hips: None, normal, Overall Severity Severity of abnormal movements (highest score from questions above): None, normal Incapacitation due  to abnormal movements: None, normal Patient's awareness of abnormal movements (rate only patient's report): No Awareness, Dental Status Current problems with teeth and/or dentures?: No Does patient usually wear dentures?: No  CIWA:    COWS:     Musculoskeletal: Strength & Muscle Tone: within normal limits Gait & Station: normal Patient leans: N/A  Psychiatric Specialty Exam: Physical Exam  Nursing note and vitals reviewed. Constitutional: He is oriented to person, place, and time. He appears well-developed and well-nourished.  HENT:  Head: Normocephalic and atraumatic.  Respiratory: Effort normal.  Neurological: He is alert and oriented to person, place, and time.  Psychiatric: He has a normal mood and affect. His behavior is normal.    Review of Systems  Psychiatric/Behavioral: Positive for substance abuse. Negative for depression (improving ), hallucinations and suicidal ideas. The patient is not nervous/anxious.   All other systems reviewed and are negative.    Blood pressure 131/74, pulse 79, temperature 98.3 F (36.8 C), resp. rate 16, height 5\' 10"  (1.778 m), weight 70 kg, SpO2 98 %.Body mass index is 22.14 kg/m.  General Appearance: Casual and Guarded  Eye Contact:  Good  Speech:  Normal Rate  Volume:  Normal  Mood:  Depressed  Affect:  Depressed and Flat  Thought Process:  Coherent and Linear  Orientation:  Full (Time, Place, and Person)  Thought Content:  Hallucinations: None  Suicidal Thoughts:  No  Homicidal Thoughts:  No  Memory:  Immediate;   Fair Recent;   Fair  Judgement:  Other:  improving   Insight:  Fair/ improving   Psychomotor Activity:  Normal  Concentration:  Concentration: Good  Recall:  Good  Fund of Knowledge:  Good  Language:  Good  Akathisia:  Negative  Handed:  Right  AIMS (if indicated):     Assets:  Communication Skills Desire for Improvement Housing Physical Health Resilience  ADL's:  Intact  Cognition:  WNL  Sleep:  Number of  Hours: 5.25     Treatment Plan Summary: Daily contact with patient to assess and evaluate symptoms and progress in treatment and Medication management   Continue with current treatment plan on 05/31/2018 as listed below except where noted  Continue Prozac 20 mgrs QDAY for depression, anxiety Continue Neurontin 100 mgrs TID for anxiety, peripheral neuropathy Continue Trazodone  100 mgrs QHS PRN for anxiety   Continue DM management - have consulted  with Dr. Deno Etienne, hospitalist, for DM management recommendations. Also will order 2 AM CBG as requested by DM  . DM Coordinator following for recommendations.   Encourage group and milieu participation to work on Pharmacologist and symptom reduction Encourage efforts to work on sobriety and relapse prevention efforts  Treatment Team working on disposition planning options  Oneta Rack, NP 05/31/2018, 4:52 PM  ..Agree with NP Progress Note

## 2018-06-01 DIAGNOSIS — E119 Type 2 diabetes mellitus without complications: Secondary | ICD-10-CM

## 2018-06-01 DIAGNOSIS — G629 Polyneuropathy, unspecified: Secondary | ICD-10-CM

## 2018-06-01 DIAGNOSIS — F332 Major depressive disorder, recurrent severe without psychotic features: Principal | ICD-10-CM

## 2018-06-01 LAB — GLUCOSE, CAPILLARY
GLUCOSE-CAPILLARY: 410 mg/dL — AB (ref 70–99)
GLUCOSE-CAPILLARY: 135 mg/dL — AB (ref 70–99)
Glucose-Capillary: 385 mg/dL — ABNORMAL HIGH (ref 70–99)
Glucose-Capillary: 72 mg/dL (ref 70–99)
Glucose-Capillary: 366 mg/dL — ABNORMAL HIGH (ref 70–99)

## 2018-06-01 NOTE — Plan of Care (Addendum)
Progress Note  D: pt found in bed; compliant with medication administration. Pt states he slept poorly again. Pt rates his depression/hopelessness/anxiety an 8/9/8 out of 10 respectively. Pt complains of a headache that he rates at a 7/10 but has no other physical symptoms. Pt denies any si/hi/ah/vh and verbally agrees to approach staff if these become apparent. Pt is wondering about discharge. Pt reassured and educated. A: pt provided support and encouragement. Pt given medications per protocol and standing orders. Q70m safety checks implemented and continued.  R: pt safe on the unit. Will continue to monitor.  Pt progressing in the following metrics  Problem: Education: Goal: Mental status will improve Outcome: Progressing Goal: Verbalization of understanding the information provided will improve Outcome: Progressing   Problem: Activity: Goal: Sleeping patterns will improve Outcome: Progressing   Problem: Health Behavior/Discharge Planning: Goal: Compliance with treatment plan for underlying cause of condition will improve Outcome: Progressing   Problem: Physical Regulation: Goal: Ability to maintain clinical measurements within normal limits will improve Outcome: Progressing   Problem: Safety: Goal: Periods of time without injury will increase Outcome: Progressing

## 2018-06-01 NOTE — Progress Notes (Signed)
D    Pt is pleasant on approach and cooperative   He attends and participates in groups and his behavior is appropriate  A   Verbal support given   Discussed diabetes and changes to insulin regimen  with patient and pt agreed to let staff know if he feels like his blood sugar is low   Pt did eat a snack and did not receive any HS coverage   Pt is making better food choices this evening for snack   R    Pt is safe and receptive to teaching

## 2018-06-01 NOTE — [facility] Group Notes (Signed)
Adult Psychoeducational Group Note  Date:  06/01/2018 Time:  4:25 AM  Group Topic/Focus:  Wrap-Up Group:   The focus of this group is to help patients review their daily goal of treatment and discuss progress on daily workbooks.  Participation Level:  Active  Participation Quality:  Appropriate and Attentive  Affect:  Appropriate  Cognitive:  Alert and Appropriate  Insight: Appropriate and Good  Engagement in Group:  Engaged  Modes of Intervention:  Discussion and Education  Additional Comments:  Pt attended and participated in wrap up group this evening. Pt had a pretty good day, due to them not having any stress for the day. Pt completed their goal, which was to stay positive.   John Cox 06/01/2018, 4:25 AM

## 2018-06-01 NOTE — Progress Notes (Signed)
Inpatient Diabetes Program Recommendations  AACE/ADA: New Consensus Statement on Inpatient Glycemic Control (2015)  Target Ranges:  Prepandial:   less than 140 mg/dL      Peak postprandial:   less than 180 mg/dL (1-2 hours)      Critically ill patients:  140 - 180 mg/dL   Lab Results  Component Value Date   GLUCAP 385 (H) 06/01/2018   HGBA1C 10.6 (H) 05/26/2018    Review of Glycemic Control  Per RN, pt eating a lot of sugary snacks and beverages. Not being cautious with trying to control his blood sugars.   Inpatient Diabetes Program Recommendations:     Increase 70/30 to 28 units bid.  Please order 2 am blood sugar to make sure it's not dropping in the middle of the night.  Will continue to follow.  Thank you. Ailene Ards, RD, LDN, CDE Inpatient Diabetes Coordinator [phone]

## 2018-06-01 NOTE — [facility] Group Notes (Signed)
[facility] Group Notes:  (Nursing/MHT/Case Management/Adjunct)  Date:  06/01/2018  Time:  4:15 PM  Type of Therapy:  Nurse Education  Participation Level:  Active  Participation Quality:  Appropriate and Attentive  Affect:  Appropriate  Cognitive:  Alert and Appropriate  Insight:  Appropriate  Engagement in Group:  Engaged  Modes of Intervention:  Discussion and Education  Summary of Progress/Problems: pt discussed positive and negative therapeutic coping mechanisms.  Suszanne Conners Jeris Roser 06/01/2018, 5:46 PM

## 2018-06-01 NOTE — Progress Notes (Signed)
Patient shared with the group that he had a good day and that he anticipates being discharged tomorrow. He states that he has a "clear head".

## 2018-06-01 NOTE — Progress Notes (Addendum)
[facility] MD Progress Note  06/01/2018 2:57 PM John Cox  MRN:  [id number]    Evaluation: Chrissie Noa observed standing at the nurses station.  Continues to deny suicidal or homicidal ideations.  Presents flat and guarded.  States his depression appears to be worse after speaking to his mother. Reports his mother asked him not to come home, due to his substance abuse addiction states he has been sober for the past 5 years while in the [facility].  Patient is requesting a work note so that he is able to start back working Monday 06/07/2018. NP to follow-up with Child psychotherapist. Patient is followed by Inpatient Diabetes Coordinator for fluctuation CBG's. Support, encouragement and reassurance was provided.     History: Per assessment note:Admitted to inpatient medical unit initially, after being found unresponsive by mother with severe hypoglycemia. Seen by psychiatric consultant, who recommended inpatient psychiatric admission due to worsening depression and SI. Patient reports improving mood , feeling better than on admission. Denies suicidal ideations. Reports some anxiety regarding disposition planning , states that his mother will not allow him to return home at this time so is currently homeless. States he may go to a shelter at discharge. Denies medication side effects.  No disruptive or agitated behaviors on unit . Some group participation, cooperative on approach.CBG today 286.   Principal Problem: MDD, Cocaine Use Disorder  Diagnosis:   Patient Active Problem List   Diagnosis Date Noted  . MDD (major depressive disorder), severe (HCC) [F32.2] 05/27/2018  . Tachy-brady syndrome (HCC) [I49.5] 05/26/2018  . Hypoglycemia [E16.2] 05/24/2018  . DKA, type 1 (HCC) [E10.10] 03/31/2018  . Hyperglycemia due to type 1 diabetes mellitus (HCC) [E10.65] 02/23/2018  . Cocaine dependence with cocaine-induced mood disorder (HCC) [F14.24]   . MDD (major depressive disorder), recurrent severe, without psychosis  (HCC) [F33.2] 02/22/2018  . Malnutrition of moderate degree [E44.0] 07/19/2015  . AKI (acute kidney injury) (HCC) [N17.9] 07/18/2015  . Marijuana abuse [F12.10] 07/18/2015  . Dehydration [E86.0] 07/18/2015  . Dental caries [K02.9] 07/18/2015  . Hyperlipidemia associated with type 2 diabetes mellitus (HCC) [E11.69, E78.5] 05/24/2014  . Polysubstance abuse (HCC) [F19.10] 05/13/2014  . Major depression, recurrent (HCC) [F33.9] 05/13/2014  . Suicidal ideation [R45.851] 05/13/2014  . ANTICARDIOLIPIN ANTIBODY SYNDROME [R89.4] 05/12/2007  . Type 1 diabetes mellitus (HCC) [E10.9] 04/26/2007  . TOBACCO ABUSE [F17.200] 04/26/2007  . 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 AKI (acute kidney injury) (HCC) 01/25/2018  . Anxiety   . Bipolar disorder (HCC)   . Depression 2000  . Diabetes mellitus 1995  . Hyperglycemia 01/25/2018  . Nausea & vomiting   . Polysubstance abuse (HCC) 2012   relapse 2 wk ago   . Suicidal ideation     Past Surgical History:  Procedure Laterality Date  . NO PAST SURGERIES     Family History:  Family History  Problem Relation Age of Onset  . Diabetes Mother   . COPD Mother   . Heart disease Mother    Family Psychiatric  History: See admission H&P Social History:  Social History   Substance and Sexual Activity  Alcohol Use Yes  . Alcohol/week: 1.0 standard drinks  . Types: 1 Cans of beer per week   Comment: one beer weekly     Social History   Substance and Sexual Activity  Drug Use  Yes  . Types: Marijuana, Cocaine   Comment: THC daily, Cocaine daily    Social History   Socioeconomic History  . Marital status: Single    Spouse name: Not on file  . Number of children: Not on file  . Years of education: Not on file  . Highest education level: Not on file  Occupational History  . Not on  file  Social Needs  . Financial resource strain: Not on file  . Food insecurity:    Worry: Not on file    Inability: Not on file  . Transportation needs:    Medical: Not on file    Non-medical: Not on file  Tobacco Use  . Smoking status: Current Every Day Smoker    Packs/day: 1.00    Years: 25.00    Pack years: 25.00    Types: Cigarettes  . Smokeless tobacco: Never Used  Substance and Sexual Activity  . Alcohol use: Yes    Alcohol/week: 1.0 standard drinks    Types: 1 Cans of beer per week    Comment: one beer weekly  . Drug use: Yes    Types: Marijuana, Cocaine    Comment: THC daily, Cocaine daily  . Sexual activity: Not Currently  Lifestyle  . Physical activity:    Days per week: Not on file    Minutes per session: Not on file  . Stress: Not on file  Relationships  . Social connections:    Talks on phone: Not on file    Gets together: Not on file    Attends religious service: Not on file    Active member of club or organization: Not on file    Attends meetings of clubs or organizations: Not on file    Relationship status: Not on file  Other Topics Concern  . Not on file  Social History Narrative  . Not on file   Additional Social History:   Sleep: Fair  Appetite:  Good  Current Medications: Current Facility-Administered Medications  Medication Dose Route Frequency Provider Last Rate Last Dose  . acetaminophen (TYLENOL) tablet 650 mg  650 mg Oral Q6H PRN Antonieta Pert, MD   650 mg at 06/01/18 0809  . alum & mag hydroxide-simeth (MAALOX/MYLANTA) 200-200-20 MG/5ML suspension 30 mL  30 mL Oral Q4H PRN Antonieta Pert, MD      . cyclobenzaprine (FLEXERIL) tablet 5 mg  5 mg Oral TID PRN Antonieta Pert, MD   5 mg at 05/29/18 1934  . FLUoxetine (PROZAC) capsule 20 mg  20 mg Oral Daily Antonieta Pert, MD   20 mg at 06/01/18 0806  . gabapentin (NEURONTIN) capsule 100 mg  100 mg Oral TID Antonieta Pert, MD   100 mg at 06/01/18 1213  . hydrOXYzine  (ATARAX/VISTARIL) tablet 25 mg  25 mg Oral TID PRN Antonieta Pert, MD   25 mg at 05/31/18 2142  . ibuprofen (ADVIL,MOTRIN) tablet 600 mg  600 mg Oral Q6H PRN Kerry Hough, PA-C   600 mg at 05/31/18 2142  . insulin aspart (novoLOG) injection 0-5 Units  0-5 Units Subcutaneous QHS Jerald Kief, MD      . insulin aspart (novoLOG) injection 0-9 Units  0-9 Units Subcutaneous TID WC Jerald Kief, MD   Stopped at 06/01/18 1149  . insulin aspart protamine- aspart (NOVOLOG MIX 70/30) injection 25 Units  25 Units Subcutaneous BID WC Jerald Kief, MD   25 Units at 06/01/18 [phone]  . magnesium hydroxide (  MILK OF MAGNESIA) suspension 30 mL  30 mL Oral Daily PRN Antonieta Pert, MD      . nicotine (NICODERM CQ - dosed in mg/24 hours) patch 21 mg  21 mg Transdermal Daily Antonieta Pert, MD   21 mg at 05/28/18 0757  . traZODone (DESYREL) tablet 100 mg  100 mg Oral QHS PRN Amaan Meyer, Rockey Situ, MD   100 mg at 05/31/18 2142    Lab Results:  Results for orders placed or performed during the hospital encounter of 05/27/18 (from the past 48 hour(s))  Glucose, capillary     Status: Abnormal   Collection Time: 05/30/18  5:00 PM  Result Value Ref Range   Glucose-Capillary 265 (H) 70 - 99 mg/dL  Glucose, capillary     Status: Abnormal   Collection Time: 05/30/18  9:06 PM  Result Value Ref Range   Glucose-Capillary 211 (H) 70 - 99 mg/dL   Comment 1 Notify RN    Comment 2 Document in Chart   Glucose, capillary     Status: Abnormal   Collection Time: 05/31/18  2:07 AM  Result Value Ref Range   Glucose-Capillary 343 (H) 70 - 99 mg/dL   Comment 1 Notify RN    Comment 2 Document in Chart   Glucose, capillary     Status: Abnormal   Collection Time: 05/31/18  5:47 AM  Result Value Ref Range   Glucose-Capillary 462 (H) 70 - 99 mg/dL   Comment 1 Notify RN    Comment 2 Document in Chart   Glucose, capillary     Status: Abnormal   Collection Time: 05/31/18  5:32 PM  Result Value Ref Range    Glucose-Capillary 378 (H) 70 - 99 mg/dL   Comment 1 Notify RN    Comment 2 Document in Chart   Glucose, capillary     Status: Abnormal   Collection Time: 05/31/18  8:43 PM  Result Value Ref Range   Glucose-Capillary 175 (H) 70 - 99 mg/dL   Comment 1 Notify RN    Comment 2 Document in Chart   Glucose, capillary     Status: Abnormal   Collection Time: 06/01/18  6:09 AM  Result Value Ref Range   Glucose-Capillary 385 (H) 70 - 99 mg/dL  Glucose, capillary     Status: None   Collection Time: 06/01/18 11:43 AM  Result Value Ref Range   Glucose-Capillary 72 70 - 99 mg/dL   Comment 1 Notify RN    Comment 2 Document in Chart     Blood Alcohol level:  Lab Results  Component Value Date   ETH <10 05/24/2018   ETH <10 02/22/2018    Metabolic Disorder Labs: Lab Results  Component Value Date   HGBA1C 10.6 (H) 05/26/2018   MPG 257.52 05/26/2018   MPG 280.48 01/25/2018   No results found for: PROLACTIN Lab Results  Component Value Date   CHOL 215 (H) 05/23/2014   TRIG 135 05/23/2014   HDL 67 05/23/2014   CHOLHDL 3.2 05/23/2014   VLDL 27 05/23/2014   LDLCALC 121 (H) 05/23/2014   LDLCALC 101 (H) 06/01/2009    Physical Findings: AIMS: Facial and Oral Movements Muscles of Facial Expression: None, normal Lips and Perioral Area: None, normal Jaw: None, normal Tongue: None, normal,Extremity Movements Upper (arms, wrists, hands, fingers): None, normal Lower (legs, knees, ankles, toes): None, normal, Trunk Movements Neck, shoulders, hips: None, normal, Overall Severity Severity of abnormal movements (highest score from questions above): None, normal Incapacitation due  to abnormal movements: None, normal Patient's awareness of abnormal movements (rate only patient's report): No Awareness, Dental Status Current problems with teeth and/or dentures?: No Does patient usually wear dentures?: No  CIWA:    COWS:     Musculoskeletal: Strength & Muscle Tone: within normal limits Gait &  Station: normal Patient leans: N/A  Psychiatric Specialty Exam: Physical Exam  Nursing note and vitals reviewed. Constitutional: He is oriented to person, place, and time. He appears well-developed and well-nourished.  HENT:  Head: Atraumatic.  Respiratory: Effort normal.  Neurological: He is alert and oriented to person, place, and time.  Psychiatric: He has a normal mood and affect.    Review of Systems  Psychiatric/Behavioral: Positive for substance abuse. Negative for depression (improving ), hallucinations and suicidal ideas. The patient is not nervous/anxious.   All other systems reviewed and are negative.    Blood pressure 111/82, pulse 80, temperature 98.3 F (36.8 C), temperature source Oral, resp. rate 16, height 5\' 10"  (1.778 m), weight 70 kg, SpO2 98 %.Body mass index is 22.14 kg/m.  General Appearance: Casual and Guarded  Eye Contact:  Good  Speech:  Normal Rate  Volume:  Normal  Mood:  Depressed  Affect:  Depressed and Flat  Thought Process:  Coherent and Linear  Orientation:  Full (Time, Place, and Person)  Thought Content:  Hallucinations: None  Suicidal Thoughts:  No  Homicidal Thoughts:  No  Memory:  Immediate;   Fair Recent;   Fair  Judgement:  Other:  improving   Insight:  Fair/ improving   Psychomotor Activity:  Normal  Concentration:  Concentration: Good  Recall:  Good  Fund of Knowledge:  Good  Language:  Good  Akathisia:  Negative  Handed:  Right  AIMS (if indicated):     Assets:  Communication Skills Desire for Improvement Housing Physical Health Resilience  ADL's:  Intact  Cognition:  WNL  Sleep:  Number of Hours: 6.75     Treatment Plan Summary: Daily contact with patient to assess and evaluate symptoms and progress in treatment and Medication management   Continue with current treatment plan on 10/01/2019as listed below except where noted  Continue Prozac 20 mgrs QDAY for depression, anxiety Continue Neurontin 100 mgrs TID for  anxiety, peripheral neuropathy Continue Trazodone  100 mgrs QHS PRN for anxiety   Continue DM management - have consulted  with Dr. Deno Etienne, hospitalist, for DM management recommendations. Also will order 2 AM CBG as requested by DM  . DM Coordinator following for recommendations.   Encourage group and milieu participation to work on Pharmacologist and symptom reduction Encourage efforts to work on sobriety and relapse prevention efforts  Treatment Team working on disposition planning options  Oneta Rack, NP 06/01/2018, 2:57 PM  ..Agree with NP Progress Note

## 2018-06-01 NOTE — [facility] Group Notes (Signed)
[facility] Mental [facility] Association Group Therapy      06/01/2018 2:41 PM  Type of Therapy: Mental Health Association Presentation  Participation Level: Active  Participation Quality: Attentive  Affect: Appropriate  Cognitive: Oriented  Insight: Developing/Improving  Engagement in Therapy: Engaged  Modes of Intervention: Discussion, Education and Socialization  Summary of Progress/Problems: Mental Health Association (MHA) Speaker came to talk about his personal journey with mental health. The pt processed ways by which to relate to the speaker. MHA speaker provided handouts and educational information pertaining to groups and services offered by the [facility]. Pt was engaged in speaker's presentation and was receptive to resources provided.    Alcario Drought Clinical Social Worker

## 2018-06-02 DIAGNOSIS — E1065 Type 1 diabetes mellitus with hyperglycemia: Secondary | ICD-10-CM

## 2018-06-02 LAB — GLUCOSE, CAPILLARY
GLUCOSE-CAPILLARY: 90 mg/dL (ref 70–99)
GLUCOSE-CAPILLARY: 397 mg/dL — AB (ref 70–99)
GLUCOSE-CAPILLARY: 202 mg/dL — AB (ref 70–99)
Glucose-Capillary: 439 mg/dL — ABNORMAL HIGH (ref 70–99)

## 2018-06-02 MED ORDER — INSULIN ASPART PROT & ASPART (70-30 MIX) 100 UNIT/ML ~~LOC~~ SUSP: 28.0000 [IU] | Freq: Two times a day (BID) | SUBCUTANEOUS | Status: DC

## 2018-06-02 MED ORDER — INSULIN ASPART PROT & ASPART (70-30 MIX) 100 UNIT/ML ~~LOC~~ SUSP
22.0000 [IU] | Freq: Every day | SUBCUTANEOUS | Status: DC
  Administered 2018-06-03: 22 [IU] via SUBCUTANEOUS
  Filled 2018-06-02 (×2): qty 0.22

## 2018-06-02 MED ORDER — INSULIN ASPART PROT & ASPART (70-30 MIX) 100 UNIT/ML ~~LOC~~ SUSP: 28.0000 [IU] | Freq: Every day | SUBCUTANEOUS | Status: DC

## 2018-06-02 NOTE — [facility] Group Notes (Signed)
Adult Psychoeducational Group Note  Date:  06/02/2018 Time:  9:26 PM  Group Topic/Focus:  Wrap-Up Group:   The focus of this group is to help patients review their daily goal of treatment and discuss progress on daily workbooks.  Participation Level:  Active  Participation Quality:  Appropriate  Affect:  Appropriate  Cognitive:  Appropriate  Insight: Appropriate  Engagement in Group:  Engaged  Modes of Intervention:  Discussion  Additional Comments:  Patient stated that she had a good day with the exception of an elevated blood sugar. Patient is hoping to hear about discharge soon.  Lyndee Hensen 06/02/2018, 9:26 PM

## 2018-06-02 NOTE — [facility] Group Notes (Signed)
[facility] Group Notes:  (Nursing/MHT/Case Management/Adjunct)  Date:  06/02/2018  Time:  4:00 pm  Type of Therapy:  Psychoeducational Skills  Participation Level:  Did Not Attend  Participation Quality:    Affect:    Cognitive:    Insight:    Engagement in Group:    Modes of Intervention:    Summary of Progress/Problems:  John Cox 06/02/2018, 6:29 PM

## 2018-06-02 NOTE — [facility] Group Notes (Signed)
LCSW Group Therapy Note 06/02/2018 1:03 PM  Type of Therapy/Topic: Group Therapy: Feelings about Diagnosis  Participation Level: Active   Description of Group:  This group will allow patients to explore their thoughts and feelings about diagnoses they have received. Patients will be guided to explore their level of understanding and acceptance of these diagnoses. Facilitator will encourage patients to process their thoughts and feelings about the reactions of others to their diagnosis and will guide patients in identifying ways to discuss their diagnosis with significant others in their lives. This group will be process-oriented, with patients participating in exploration of their own experiences, giving and receiving support, and processing challenge from other group members.  Therapeutic Goals: 1. Patient will demonstrate understanding of diagnosis as evidenced by identifying two or more symptoms of the disorder 2. Patient will be able to express two feelings regarding the diagnosis 3. Patient will demonstrate their ability to communicate their needs through discussion and/or role play  Summary of Patient Progress:  John Cox was engaged and participated throughout the group session. John Cox reports that his mental health issues do not define him, however he has ruined a lot of relationships due to his anger problems.     Therapeutic Modalities:  Cognitive Behavioral Therapy Brief Therapy Feelings Identification    John Cox John Cox Clinical Social Worker

## 2018-06-02 NOTE — Progress Notes (Signed)
Nursing Progress Note: 7p-7a D: Pt currently presents with a pleasant/silly/pleasant/anxiety/depressed affect and behavior. Pt states "I want to go home." Interacting appropriately with the milieu. Pt reports good sleep during the previous night with current medication regimen. Pt did attend wrap-up group.  A: Pt provided with medications per providers orders. Pt's labs and vitals were monitored throughout the night. Pt supported emotionally and encouraged to express concerns and questions. Pt educated on medications.  R: Pt's safety ensured with 15 minute and environmental checks. Pt currently denies SI, HI, and AVH. Pt verbally contracts to seek staff if SI,HI, or AVH occurs and to consult with staff before acting on any harmful thoughts. Will continue to monitor.

## 2018-06-02 NOTE — Progress Notes (Signed)
[facility] MD Progress Note  06/02/2018 11:42 AM John Cox  MRN:  [id number]   Subjective- patient reports he is feeling better, less depressed , denies suicidal ideations at this time. Denies medication side effects. Objective- I have discussed case with treatment team and have met with patient. 47 year old male, employed, lives with mother. History of DM I, Depression, Substance Use Disorder ( identifies cocaine as substance of choice) . Admitted to inpatient medical unit initially, after being found unresponsive by mother with severe hypoglycemia. Seen by psychiatric consultant, who recommended inpatient psychiatric admission due to worsening depression and SI. At this time patient reports improving mood , states he is feeling better than before admission.Affect remains vaguely constricted , although he does smile at times during session today. Denies suicidal ideations. At this time is future oriented and focusing on disposition planning - states he is planning on going to [employer] or to [employer] at discharge. Denies medication side effects and states he feels Prozac is helping . *He has a history of DM I, and CBGs have remained elevated . We have reviewed importance of making adequate meal choices at mealtimes. He is being followed by Diabetes Care Coordinator and I have reviewed case with both DM Care Coordinator and with Scientist, physiological . As per DM Care coordinator recommendation Insulin 70/30 further titrated to 28 units BID.   Principal Problem: MDD, Cocaine Use Disorder  Diagnosis:   Patient Active Problem List   Diagnosis Date Noted  . MDD (major depressive disorder), severe ([place]) [F32.2] 05/27/2018  . Tachy-brady syndrome ([place]) [I49.5] 05/26/2018  . Hypoglycemia [E16.2] 05/24/2018  . DKA, type 1 ([place]) [E10.10] 03/31/2018  . Hyperglycemia due to type 1 diabetes mellitus ([place]) [E10.65] 02/23/2018  . Cocaine dependence with cocaine-induced mood disorder ([place])  [F14.24]   . MDD (major depressive disorder), recurrent severe, without psychosis ([place]) [F33.2] 02/22/2018  . Malnutrition of moderate degree [E44.0] 07/19/2015  . AKI (acute kidney injury) ([place]) [N17.9] 07/18/2015  . Marijuana abuse [F12.10] 07/18/2015  . Dehydration [E86.0] 07/18/2015  . Dental caries [K02.9] 07/18/2015  . Hyperlipidemia associated with type 2 diabetes mellitus ([place]) [E11.69, E78.5] 05/24/2014  . Polysubstance abuse ([place]) [F19.10] 05/13/2014  . Major depression, recurrent ([place]) [F33.9] 05/13/2014  . Suicidal ideation [R45.851] 05/13/2014  . ANTICARDIOLIPIN ANTIBODY SYNDROME [R89.4] 05/12/2007  . Type 1 diabetes mellitus ([place]) [E10.9] 04/26/2007  . TOBACCO ABUSE [F17.200] 04/26/2007  . 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 AKI (acute kidney injury) ([place]) 01/25/2018  . Anxiety   . Bipolar disorder ([place])   . Depression 2000  . Diabetes mellitus 1995  . Hyperglycemia 01/25/2018  . Nausea & vomiting   . Polysubstance abuse ([place]) 2012   relapse 2 wk ago   . Suicidal ideation     Past Surgical History:  Procedure Laterality Date  . NO PAST SURGERIES     Family History:  Family History  Problem Relation Age of Onset  . Diabetes Mother   . COPD Mother   . Heart disease Mother    Family Psychiatric  History: See admission H&P Social History:  Social History   Substance and Sexual Activity  Alcohol Use Yes  . Alcohol/week: 1.0 standard drinks  . Types: 1 Cans of beer per week   Comment: one beer weekly     Social History  Substance and Sexual Activity  Drug Use Yes  . Types: Marijuana, Cocaine   Comment: THC daily, Cocaine daily    Social History   Socioeconomic History  . Marital status: Single    Spouse name: Not on file  . Number of children: Not on file  . Years of education: Not on  file  . Highest education level: Not on file  Occupational History  . Not on file  Social Needs  . Financial resource strain: Not on file  . Food insecurity:    Worry: Not on file    Inability: Not on file  . Transportation needs:    Medical: Not on file    Non-medical: Not on file  Tobacco Use  . Smoking status: Current Every Day Smoker    Packs/day: 1.00    Years: 25.00    Pack years: 25.00    Types: Cigarettes  . Smokeless tobacco: Never Used  Substance and Sexual Activity  . Alcohol use: Yes    Alcohol/week: 1.0 standard drinks    Types: 1 Cans of beer per week    Comment: one beer weekly  . Drug use: Yes    Types: Marijuana, Cocaine    Comment: THC daily, Cocaine daily  . Sexual activity: Not Currently  Lifestyle  . Physical activity:    Days per week: Not on file    Minutes per session: Not on file  . Stress: Not on file  Relationships  . Social connections:    Talks on phone: Not on file    Gets together: Not on file    Attends religious service: Not on file    Active member of club or organization: Not on file    Attends meetings of clubs or organizations: Not on file    Relationship status: Not on file  Other Topics Concern  . Not on file  Social History Narrative  . Not on file   Additional Social History:   Sleep: improving   Appetite:  Good  Current Medications: Current Facility-Administered Medications  Medication Dose Route Frequency Provider Last Rate Last Dose  . acetaminophen (TYLENOL) tablet 650 mg  650 mg Oral Q6H PRN Sharma Covert, MD   650 mg at 06/01/18 0809  . alum & mag hydroxide-simeth (MAALOX/MYLANTA) 200-200-20 MG/5ML suspension 30 mL  30 mL Oral Q4H PRN Sharma Covert, MD   30 mL at 06/01/18 1929  . cyclobenzaprine (FLEXERIL) tablet 5 mg  5 mg Oral TID PRN Sharma Covert, MD   5 mg at 05/29/18 1934  . FLUoxetine (PROZAC) capsule 20 mg  20 mg Oral Daily Sharma Covert, MD   20 mg at 06/02/18 0915  . gabapentin  (NEURONTIN) capsule 100 mg  100 mg Oral TID Sharma Covert, MD   100 mg at 06/02/18 0915  . hydrOXYzine (ATARAX/VISTARIL) tablet 25 mg  25 mg Oral TID PRN Sharma Covert, MD   25 mg at 06/01/18 2150  . ibuprofen (ADVIL,MOTRIN) tablet 600 mg  600 mg Oral Q6H PRN Laverle Hobby, PA-C   600 mg at 06/01/18 2150  . insulin aspart (novoLOG) injection 0-5 Units  0-5 Units Subcutaneous QHS Donne Hazel, MD      . insulin aspart (novoLOG) injection 0-9 Units  0-9 Units Subcutaneous TID WC Donne Hazel, MD   9 Units at 06/02/18 0645  . insulin aspart protamine- aspart (NOVOLOG MIX 70/30) injection 28 Units  28 Units Subcutaneous BID WC Cobos, Myer Peer,  MD      . magnesium hydroxide (MILK OF MAGNESIA) suspension 30 mL  30 mL Oral Daily PRN Sharma Covert, MD      . nicotine (NICODERM CQ - dosed in mg/24 hours) patch 21 mg  21 mg Transdermal Daily Sharma Covert, MD   21 mg at 05/28/18 0757  . traZODone (DESYREL) tablet 100 mg  100 mg Oral QHS PRN Cobos, Myer Peer, MD   100 mg at 06/01/18 2150    Lab Results:  Results for orders placed or performed during the hospital encounter of 05/27/18 (from the past 48 hour(s))  Glucose, capillary     Status: Abnormal   Collection Time: 05/31/18  5:32 PM  Result Value Ref Range   Glucose-Capillary 378 (H) 70 - 99 mg/dL   Comment 1 Notify RN    Comment 2 Document in Chart   Glucose, capillary     Status: Abnormal   Collection Time: 05/31/18  8:43 PM  Result Value Ref Range   Glucose-Capillary 175 (H) 70 - 99 mg/dL   Comment 1 Notify RN    Comment 2 Document in Chart   Glucose, capillary     Status: Abnormal   Collection Time: 06/01/18  6:09 AM  Result Value Ref Range   Glucose-Capillary 385 (H) 70 - 99 mg/dL  Glucose, capillary     Status: None   Collection Time: 06/01/18 11:43 AM  Result Value Ref Range   Glucose-Capillary 72 70 - 99 mg/dL   Comment 1 Notify RN    Comment 2 Document in Chart   Glucose, capillary     Status:  Abnormal   Collection Time: 06/01/18  4:04 PM  Result Value Ref Range   Glucose-Capillary 366 (H) 70 - 99 mg/dL  Glucose, capillary     Status: Abnormal   Collection Time: 06/01/18  4:57 PM  Result Value Ref Range   Glucose-Capillary 410 (H) 70 - 99 mg/dL   Comment 1 Notify RN    Comment 2 Document in Chart   Glucose, capillary     Status: Abnormal   Collection Time: 06/01/18  8:48 PM  Result Value Ref Range   Glucose-Capillary 135 (H) 70 - 99 mg/dL  Glucose, capillary     Status: Abnormal   Collection Time: 06/02/18  6:09 AM  Result Value Ref Range   Glucose-Capillary 439 (H) 70 - 99 mg/dL    Blood Alcohol level:  Lab Results  Component Value Date   ETH <10 05/24/2018   ETH <10 44/09/270    Metabolic Disorder Labs: Lab Results  Component Value Date   HGBA1C 10.6 (H) 05/26/2018   MPG 257.52 05/26/2018   MPG 280.48 01/25/2018   No results found for: PROLACTIN Lab Results  Component Value Date   CHOL 215 (H) 05/23/2014   TRIG 135 05/23/2014   HDL 67 05/23/2014   CHOLHDL 3.2 05/23/2014   VLDL 27 05/23/2014   LDLCALC 121 (H) 05/23/2014   LDLCALC 101 (H) 06/01/2009    Physical Findings: AIMS: Facial and Oral Movements Muscles of Facial Expression: None, normal Lips and Perioral Area: None, normal Jaw: None, normal Tongue: None, normal,Extremity Movements Upper (arms, wrists, hands, fingers): None, normal Lower (legs, knees, ankles, toes): None, normal, Trunk Movements Neck, shoulders, hips: None, normal, Overall Severity Severity of abnormal movements (highest score from questions above): None, normal Incapacitation due to abnormal movements: None, normal Patient's awareness of abnormal movements (rate only patient's report): No Awareness, Dental Status Current problems with  teeth and/or dentures?: No Does patient usually wear dentures?: No  CIWA:    COWS:     Musculoskeletal: Strength & Muscle Tone: within normal limits Gait & Station: normal Patient  leans: N/A  Psychiatric Specialty Exam: Physical Exam  Nursing note and vitals reviewed. Constitutional: He is oriented to person, place, and time. He appears well-developed and well-nourished.  HENT:  Head: Atraumatic.  Respiratory: Effort normal.  Neurological: He is alert and oriented to person, place, and time.  Psychiatric: He has a normal mood and affect.    Review of Systems  Psychiatric/Behavioral: Positive for substance abuse. Negative for depression (improving ), hallucinations and suicidal ideas. The patient is not nervous/anxious.   All other systems reviewed and are negative.  mild headache, no chest pain, no shortness of breath, no vomiting   Blood pressure 132/79, pulse 80, temperature 98.2 F (36.8 C), temperature source Oral, resp. rate 16, height 5' 10" (1.778 m), weight 70 kg, SpO2 98 %.Body mass index is 22.14 kg/m.  General Appearance: Fairly Groomed  Eye Contact:  Good  Speech:  Normal Rate  Volume:  Normal  Mood:  reports improving mood , states he feels better   Affect:  less constricted   Thought Process:  Linear and Descriptions of Associations: Intact  Orientation:  Full (Time, Place, and Person)  Thought Content:  no hallucinations, no delusions, not internally preoccupied   Suicidal Thoughts:  No denies suicidal or self injurious ideations   Homicidal Thoughts:  No  Memory:  recent and remote grossly intact   Judgement:  Other:  improving   Insight:  Fair/ improving   Psychomotor Activity:  Normal  Concentration:  Concentration: Good and Attention Span: Good  Recall:  Good  Fund of Knowledge:  Good  Language:  Good  Akathisia:  Negative  Handed:  Right  AIMS (if indicated):     Assets:  Communication Skills Desire for Improvement Housing Physical Health Resilience  ADL's:  Intact  Cognition:  WNL  Sleep:  Number of Hours: 6.25   Assessment - 47 year old male, employed, lives with mother. History of DM I, Depression, Substance Use  Disorder ( identifies cocaine as substance of choice) . Admitted to inpatient medical unit initially, after being found unresponsive by mother with severe hypoglycemia. Seen by psychiatric consultant, who recommended inpatient psychiatric admission due to worsening depression and SI. Patient is reporting improving mood , feeling better, denies suicidal ideations , and is currently focusing more on disposition planning . He is currently homeless as he states he cannot currently return to his mother's , and plans to go to shelter at discharge.  Tolerating medications well , denies side effects. DM management recommendations and follow up by DM DM coordinator .  Treatment Plan Summary: Treatment Plan reviewed as below today 10/2  Encourage group and milieu participation to work on coping skills and symptom reduction Encourage efforts to work on Radiographer, therapeutic and symptom reduction Continue Neurontin 100 mgrs TID for anxiety, peripheral neuropathy Continue Trazodone  100 mgrs QHS PRN for anxiety  Continue Prozac 20 mgrs QDAY for depression and anxiety Continue DM management - as per DM coordinator recommendations, Insulin 70/30 now 28 units BID. Have also reviewed with Clinical cytogeneticist . Treatment Team working on disposition planning options Patient does not currently have an outpatient PCP- we discussed importance of outpatient medical management/monitoring for DM- plans to [facility] at [facility]  Jenne Campus, MD 06/02/2018, 11:42 AM    Patient ID:  John Cox, male   DOB: 05/04/1971, 47 y.o.   MRN: [id number]

## 2018-06-02 NOTE — Progress Notes (Signed)
Recreation Therapy Notes  Date: 10.2.19 Time: 0930 Location: 300 [address] Dayroom  Group Topic: Stress Management  Goal Area(s) Addresses:  Patient will verbalize importance of using healthy stress management.  Patient will identify positive emotions associated with healthy stress management.   Intervention: Stress Management  Activity :  Guided Imagery.  LRT introduced the stress management technique of guided imagery.  LRT read a script taking the patients on a journey on the beach at sunrise.  Patients were to listen and follow along as script was read.  Education:  Stress Management, Discharge Planning.   Education Outcome: Acknowledges edcuation/In group clarification offered/Needs additional education  Clinical Observations/Feedback: Pt did not attend group.    Caroll Rancher, LRT/CTRS         Caroll Rancher A 06/02/2018 11:26 AM

## 2018-06-02 NOTE — Tx Team (Signed)
Interdisciplinary Treatment and Diagnostic Plan Update  06/02/2018 Time of Session:  John Cox MRN: [id number]  Principal Diagnosis: <principal problem not specified>  Secondary Diagnoses: Active Problems:   Major depression, recurrent (HCC)   MDD (major depressive disorder), severe ([place])   Current Medications:  Current Facility-Administered Medications  Medication Dose Route Frequency Provider Last Rate Last Dose  . acetaminophen (TYLENOL) tablet 650 mg  650 mg Oral Q6H PRN Sharma Covert, MD   650 mg at 06/01/18 0809  . alum & mag hydroxide-simeth (MAALOX/MYLANTA) 200-200-20 MG/5ML suspension 30 mL  30 mL Oral Q4H PRN Sharma Covert, MD   30 mL at 06/01/18 1929  . cyclobenzaprine (FLEXERIL) tablet 5 mg  5 mg Oral TID PRN Sharma Covert, MD   5 mg at 05/29/18 1934  . FLUoxetine (PROZAC) capsule 20 mg  20 mg Oral Daily Sharma Covert, MD   20 mg at 06/01/18 0806  . gabapentin (NEURONTIN) capsule 100 mg  100 mg Oral TID Sharma Covert, MD   100 mg at 06/01/18 1705  . hydrOXYzine (ATARAX/VISTARIL) tablet 25 mg  25 mg Oral TID PRN Sharma Covert, MD   25 mg at 06/01/18 2150  . ibuprofen (ADVIL,MOTRIN) tablet 600 mg  600 mg Oral Q6H PRN Laverle Hobby, PA-C   600 mg at 06/01/18 2150  . insulin aspart (novoLOG) injection 0-5 Units  0-5 Units Subcutaneous QHS Donne Hazel, MD      . insulin aspart (novoLOG) injection 0-9 Units  0-9 Units Subcutaneous TID WC Donne Hazel, MD   9 Units at 06/02/18 0645  . insulin aspart protamine- aspart (NOVOLOG MIX 70/30) injection 25 Units  25 Units Subcutaneous BID WC Donne Hazel, MD   25 Units at 06/01/18 1705  . magnesium hydroxide (MILK OF MAGNESIA) suspension 30 mL  30 mL Oral Daily PRN Sharma Covert, MD      . nicotine (NICODERM CQ - dosed in mg/24 hours) patch 21 mg  21 mg Transdermal Daily Sharma Covert, MD   21 mg at 05/28/18 0757  . traZODone (DESYREL) tablet 100 mg  100 mg Oral QHS PRN Cobos,  Myer Peer, MD   100 mg at 06/01/18 2150   PTA Medications: Medications Prior to Admission  Medication Sig Dispense Refill Last Dose  . blood glucose meter kit and supplies Dispense based on patient and insurance preference. Use up to four times daily as directed. (FOR ICD-10 E10.9, E11.9). 1 each 0  at Unknown time  . cyclobenzaprine (FLEXERIL) 5 MG tablet Take 1 tablet (5 mg total) by mouth 3 (three) times daily as needed for muscle spasms. 30 tablet 0   . FLUoxetine (PROZAC) 10 MG capsule Take 1 capsule (10 mg total) by mouth daily. (Patient not taking: Reported on 05/24/2018) 30 capsule 0 Not Taking at Unknown time  . gabapentin (NEURONTIN) 100 MG capsule Take 1 capsule (100 mg total) by mouth 3 (three) times daily. 90 capsule 0   . insulin aspart (NOVOLOG) 100 UNIT/ML injection Inject 0-9 Units into the skin 3 (three) times daily with meals. 30 mL 0 Past Week at Unknown time  . insulin aspart protamine- aspart (NOVOLOG MIX 70/30) (70-30) 100 UNIT/ML injection Inject 0.15 mLs (15 Units total) into the skin 2 (two) times daily with a meal. 30 mL 0 Past Week at Unknown time  . Insulin Syringe-Needle U-100 (SAFETY-GLIDE 0.3CC SYR 29GX1/2) 29G X 1/2" 0.3 ML MISC Use as directed. (Patient not  taking: Reported on 05/24/2018) 200 each 0 Not Taking at Unknown time    Patient Stressors: Financial difficulties Legal issue Substance abuse  Patient Strengths: Active sense of humor Supportive family/friends  Treatment Modalities: Medication Management, Group therapy, Case management,  1 to 1 session with clinician, Psychoeducation, Recreational therapy.   Physician Treatment Plan for Primary Diagnosis: <principal problem not specified> Long Term Goal(s): Improvement in symptoms so as ready for discharge Improvement in symptoms so as ready for discharge   Short Term Goals: Ability to identify changes in lifestyle to reduce recurrence of condition will improve Ability to verbalize feelings will  improve Ability to disclose and discuss suicidal ideas Ability to demonstrate self-control will improve Ability to identify and develop effective coping behaviors will improve Ability to maintain clinical measurements within normal limits will improve Compliance with prescribed medications will improve Ability to identify triggers associated with substance abuse/mental health issues will improve Ability to identify changes in lifestyle to reduce recurrence of condition will improve Ability to verbalize feelings will improve Ability to disclose and discuss suicidal ideas Ability to demonstrate self-control will improve Ability to identify and develop effective coping behaviors will improve Ability to maintain clinical measurements within normal limits will improve Compliance with prescribed medications will improve Ability to identify triggers associated with substance abuse/mental health issues will improve  Medication Management: Evaluate patient's response, side effects, and tolerance of medication regimen.  Therapeutic Interventions: 1 to 1 sessions, Unit Group sessions and Medication administration.  Evaluation of Outcomes: Adequate for Discharge  Physician Treatment Plan for Secondary Diagnosis: Active Problems:   Major depression, recurrent (HCC)   MDD (major depressive disorder), severe ([name])  Long Term Goal(s): Improvement in symptoms so as ready for discharge Improvement in symptoms so as ready for discharge   Short Term Goals: Ability to identify changes in lifestyle to reduce recurrence of condition will improve Ability to verbalize feelings will improve Ability to disclose and discuss suicidal ideas Ability to demonstrate self-control will improve Ability to identify and develop effective coping behaviors will improve Ability to maintain clinical measurements within normal limits will improve Compliance with prescribed medications will improve Ability to identify  triggers associated with substance abuse/mental health issues will improve Ability to identify changes in lifestyle to reduce recurrence of condition will improve Ability to verbalize feelings will improve Ability to disclose and discuss suicidal ideas Ability to demonstrate self-control will improve Ability to identify and develop effective coping behaviors will improve Ability to maintain clinical measurements within normal limits will improve Compliance with prescribed medications will improve Ability to identify triggers associated with substance abuse/mental health issues will improve     Medication Management: Evaluate patient's response, side effects, and tolerance of medication regimen.  Therapeutic Interventions: 1 to 1 sessions, Unit Group sessions and Medication administration.  Evaluation of Outcomes: Adequate for Discharge   RN Treatment Plan for Primary Diagnosis: <principal problem not specified> Long Term Goal(s): Knowledge of disease and therapeutic regimen to maintain health will improve  Short Term Goals: Ability to participate in decision making will improve, Ability to disclose and discuss suicidal ideas, Ability to identify and develop effective coping behaviors will improve and Compliance with prescribed medications will improve  Medication Management: RN will administer medications as ordered by provider, will assess and evaluate patient's response and provide education to patient for prescribed medication. RN will report any adverse and/or side effects to prescribing provider.  Therapeutic Interventions: 1 on 1 counseling sessions, Psychoeducation, Medication administration, Evaluate responses to treatment, Monitor  vital signs and CBGs as ordered, Perform/monitor CIWA, COWS, AIMS and Fall Risk screenings as ordered, Perform wound care treatments as ordered.  Evaluation of Outcomes: Adequate for Discharge   LCSW Treatment Plan for Primary Diagnosis: <principal  problem not specified> Long Term Goal(s): Safe transition to appropriate next level of care at discharge, Engage patient in therapeutic group addressing interpersonal concerns.  Short Term Goals: Engage patient in aftercare planning with referrals and resources  Therapeutic Interventions: Assess for all discharge needs, 1 to 1 time with Social worker, Explore available resources and support systems, Assess for adequacy in community support network, Educate family and significant other(s) on suicide prevention, Complete Psychosocial Assessment, Interpersonal group therapy.  Evaluation of Outcomes: Adequate for Discharge   Progress in Treatment: Attending groups: Yes.   Participating in groups: Yes. Taking medication as prescribed: Yes. Toleration medication: Yes. Family/Significant other contact made: No, will contact:  if patient provides consent for collateral contacts Patient understands diagnosis: Yes. Discussing patient identified problems/goals with staff: Yes. Medical problems stabilized or resolved: Yes. Denies suicidal/homicidal ideation: Yes. Issues/concerns per patient self-inventory: No. Other:  New problem(s) identified: None   New Short Term/Long Term Goal(s):medication stabilization, elimination of SI thoughts, development of comprehensive mental wellness plan.    Patient Goals:  Get on some medicine to stop being so down  Discharge Plan or Barriers: CSW will assess for appropriate referrals and discharge planning. Patient plans to follow up with [facility] for outpatient medication management. CSW provided housing resources for alternative housing options. CSW will continue to follow for a final plan.   Reason for Continuation of Hospitalization: Depression, medication stabilization  Estimated Length of Stay:3-5 days  Attendees: Patient: 06/02/2018 8:44 AM  Physician: Dr. Myles Lipps, MD; Dr. Neita Garnet, MD 06/02/2018 8:44 AM  Nursing: Legrand Como.S, RN; Nicoletta Dress.Viona Gilmore, RN  06/02/2018 8:44 AM  RN Care Manager: Jackelyn Knife, RN 06/02/2018 8:44 AM  Social Worker: Theresa Duty 06/02/2018 8:44 AM  Recreational Therapist: Rhunette Croft 06/02/2018 8:44 AM  Other: Marvia Pickles, NP 06/02/2018 8:44 AM  Other: Agustina Caroli, NP 06/02/2018 8:44 AM  Other: 06/02/2018 8:44 AM    Scribe for Treatment Team: Marylee Floras, [place] 06/02/2018 8:44 AM

## 2018-06-02 NOTE — Progress Notes (Signed)
Called by psychiatry for follow-up on patient with previously uncontrolled diabetes initially managed by hospitalist service at [facility] then transferred over to behavioral health for suicidal ideation.  During hospitalization, well controlled on 70/30 insulin 15 units twice daily. However following transfer to behavioral health, patient occasionally has episodes of early morning hyperglycemia with CBGs ranging from 300s-400s and then lower blood sugars by afternoon/evening.  Insulin 70/30 increased to 25 units twice a day, but above persisting.  Seen by diabetes educator.  2 AM blood sugar notes no evidence of hypoglycemia then.  Contributing factors may be buffet style eating and patient eating snacks.    Agree with plan to increase evening dose to 28 units nightly and will decrease morning dose to 22 units.  We will see how patient responds.  Plan as per psychiatrist for potential discharge tomorrow.  Patient with no funds and have recommended case management to refer to community and wellness center immediately after discharge

## 2018-06-02 NOTE — Plan of Care (Signed)
Problem: Safety: Goal: Periods of time without injury will increase Outcome: Progressing   Problem: Health Behavior/Discharge Planning: Goal: Ability to identify changes in lifestyle to reduce recurrence of condition will improve Outcome: Progressing DAR Note: Pt A & O X4. Denies SI, HI, AVH and pain. Per pt "I'm just worried about my blood sugar, it's high like in the 400s and all I ate yesterday was just some crackers". Rates his depression 3/10, hopelessness 4/10 and anxiety 4/10. Pt's goal this shift is "to stay sober" which he plans to attain by "going to a meeting". Pt is scheduled to have a CBG done at 0200 due to elevated levels. Pt updated on changes made to medication regimen. Scheduled medications given as ordered. Emotional support and encouragement offered to pt. Safety checks continues without self harm gestures or outburst to note thus far.  Pt receptive to care. Pt in agreement with changes made to [facility]. Pt attended 30% of scheduled groups. Remains medication compliant. Denies adverse drug reactions when assessed. POC maintained for safety and mood stability.

## 2018-06-03 DIAGNOSIS — F1210 Cannabis abuse, uncomplicated: Secondary | ICD-10-CM

## 2018-06-03 DIAGNOSIS — E162 Hypoglycemia, unspecified: Secondary | ICD-10-CM

## 2018-06-03 DIAGNOSIS — I495 Sick sinus syndrome: Secondary | ICD-10-CM

## 2018-06-03 LAB — GLUCOSE, CAPILLARY
GLUCOSE-CAPILLARY: 157 mg/dL — AB (ref 70–99)
GLUCOSE-CAPILLARY: 234 mg/dL — AB (ref 70–99)
GLUCOSE-CAPILLARY: 280 mg/dL — AB (ref 70–99)
GLUCOSE-CAPILLARY: 520 mg/dL — AB (ref 70–99)
Glucose-Capillary: 112 mg/dL — ABNORMAL HIGH (ref 70–99)
Glucose-Capillary: 114 mg/dL — ABNORMAL HIGH (ref 70–99)
Glucose-Capillary: 214 mg/dL — ABNORMAL HIGH (ref 70–99)
Glucose-Capillary: 327 mg/dL — ABNORMAL HIGH (ref 70–99)
Glucose-Capillary: 526 mg/dL (ref 70–99)
Glucose-Capillary: 500 mg/dL — ABNORMAL HIGH (ref 70–99)
Glucose-Capillary: 111 mg/dL — ABNORMAL HIGH (ref 70–99)
Glucose-Capillary: 244 mg/dL — ABNORMAL HIGH (ref 70–99)

## 2018-06-03 MED ORDER — INSULIN ASPART PROT & ASPART (70-30 MIX) 100 UNIT/ML ~~LOC~~ SUSP: 28.0000 [IU] | Freq: Every day | SUBCUTANEOUS | 1 refills | Status: DC

## 2018-06-03 MED ORDER — HYDROXYZINE HCL 25 MG PO TABS: 25.0000 mg | ORAL_TABLET | Freq: Three times a day (TID) | ORAL | 0 refills | Status: DC | PRN

## 2018-06-03 MED ORDER — TRAZODONE HCL 100 MG PO TABS: 100.0000 mg | ORAL_TABLET | Freq: Every evening | ORAL | 0 refills | Status: DC | PRN

## 2018-06-03 MED ORDER — FLUOXETINE HCL 20 MG PO CAPS: 20.0000 mg | ORAL_CAPSULE | Freq: Every day | ORAL | 0 refills | Status: DC

## 2018-06-03 MED ORDER — INSULIN ASPART 100 UNIT/ML ~~LOC~~ SOLN
2.0000 [IU] | Freq: Once | SUBCUTANEOUS | Status: AC
  Administered 2018-06-03: 2 [IU] via SUBCUTANEOUS

## 2018-06-03 MED ORDER — GABAPENTIN 100 MG PO CAPS: 100.0000 mg | ORAL_CAPSULE | Freq: Three times a day (TID) | ORAL | 0 refills | Status: DC

## 2018-06-03 MED ORDER — INSULIN ASPART PROT & ASPART (70-30 MIX) 100 UNIT/ML ~~LOC~~ SUSP: 22.0000 [IU] | Freq: Every day | SUBCUTANEOUS | 1 refills | Status: DC

## 2018-06-03 NOTE — Progress Notes (Signed)
CBG collected. CBG 327. Provider notified. No new orders. Will continue to monitor.

## 2018-06-03 NOTE — Progress Notes (Signed)
Pt. Discharged per MD orders;  PT. Currently denies any HI/SI or AVH.  Pt. Was given education regarding follow up appointments and medications by RN.  Pt. Denies any questions or concerns about the medications.  Pt. Was escorted to the search room to retrieve his belongings by RN before being discharged to the hospital lobby.  

## 2018-06-03 NOTE — Progress Notes (Signed)
  [facility] Adult Case Management Discharge Plan :  Will you be returning to the same living situation after discharge:  No. Patient reports he is going to the [employer] [facility] at discharge.  At discharge, do you have transportation home?: Yes,  patient reports that his uncle is picking him up at discharge Do you have the ability to pay for your medications: No.  Release of information consent forms completed and in the chart;  Patient's signature needed at discharge.  Patient to Follow up at: Follow-up Information    [facility]. Go on 06/08/2018.   Why:  Appointment for outpatient medication management and therapy services is Tuesday, 06/08/18 at 8:00am. Please be sure to bring your Photo ID, SSN, any insurance information and any discharge paperwork from this hospitalization Contact information: [address] [place] [address] [phone]        [facility]. Call on 06/07/2018.   Why:  PLEASE CALL to schedule intial appointment for primary care services. Hospital follow up appointments are only scheduled on Mondays. Please call Monday, 06/07/18 at 8:30am to schedule appointment for continuity of care for your Type 1 diabetes diagnosis.  Contact information: [address] [place] [address] [phone]          Next level of care provider has access to [facility] Link:yes  Safety Planning and Suicide Prevention discussed: Yes,  with the patient  Have you used any form of tobacco in the last 30 days? (Cigarettes, Smokeless Tobacco, Cigars, and/or Pipes): Yes  Has patient been referred to the Quitline?: Patient refused referral  Patient has been referred for addiction treatment: Yes Patient has a pending legal matters that need to be resolved prior to going to a residential treatment program.   KYLAR LEONHARDT, LCSWA 06/03/2018, 10:56 AM

## 2018-06-03 NOTE — Discharge Summary (Addendum)
Physician Discharge Summary Note  Patient:  John Cox is an 47 y.o., male MRN:  [id number] DOB:  09/08/1970 Patient phone:  [phone] (home)  Patient address:   [address] [place] [address],  Total Time spent with patient: 20 minutes  Date of Admission:  05/27/2018 Date of Discharge: 06/04/18  Reason for Admission:  Worsening depression with SI  Principal Problem: MDD (major depressive disorder), severe (HCC) Discharge Diagnoses: Patient Active Problem List   Diagnosis Date Noted  . MDD (major depressive disorder), severe (HCC) [F32.2] 05/27/2018  . Tachy-brady syndrome (HCC) [I49.5] 05/26/2018  . Hypoglycemia [E16.2] 05/24/2018  . DKA, type 1 (HCC) [E10.10] 03/31/2018  . Hyperglycemia due to type 1 diabetes mellitus (HCC) [E10.65] 02/23/2018  . Cocaine dependence with cocaine-induced mood disorder (HCC) [F14.24]   . MDD (major depressive disorder), recurrent severe, without psychosis (HCC) [F33.2] 02/22/2018  . Malnutrition of moderate degree [E44.0] 07/19/2015  . AKI (acute kidney injury) (HCC) [N17.9] 07/18/2015  . Marijuana abuse [F12.10] 07/18/2015  . Dehydration [E86.0] 07/18/2015  . Dental caries [K02.9] 07/18/2015  . Hyperlipidemia associated with type 2 diabetes mellitus (HCC) [E11.69, E78.5] 05/24/2014  . Polysubstance abuse (HCC) [F19.10] 05/13/2014  . Major depression, recurrent (HCC) [F33.9] 05/13/2014  . Suicidal ideation [R45.851] 05/13/2014  . ANTICARDIOLIPIN ANTIBODY SYNDROME [R89.4] 05/12/2007  . Type 1 diabetes mellitus (HCC) [E10.9] 04/26/2007  . TOBACCO ABUSE [F17.200] 04/26/2007  . 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  He actually spent over 4 years in [facility].  He remained sober for most that period of time.  He had one previous psychiatric admission as an adolescent at this facility [facility] a [facility].  He has used cocaine, alcohol and  marijuana.  He did have one overdose in the past.  Past Medical History:  Past Medical History:  Diagnosis Date  . AKI (acute kidney injury) (HCC) 01/25/2018  . Anxiety   . Bipolar disorder (HCC)   . Depression 2000  . Diabetes mellitus 1995  . Hyperglycemia 01/25/2018  . Nausea & vomiting   . Polysubstance abuse (HCC) 2012   relapse 2 wk ago   . Suicidal ideation     Past Surgical History:  Procedure Laterality Date  . NO PAST SURGERIES     Family History:  Family History  Problem Relation Age of Onset  . Diabetes Mother   . COPD Mother   . Heart disease Mother    Family Psychiatric  History: Patient stated he has a brother who suffers either from schizophrenia or bipolar disorder Social History:  Social History   Substance and Sexual Activity  Alcohol Use Yes  . Alcohol/week: 1.0 standard drinks  . Types: 1 Cans of beer per week   Comment: one beer weekly     Social History   Substance and Sexual Activity  Drug Use Yes  . Types: Marijuana, Cocaine   Comment: THC daily, Cocaine daily    Social History   Socioeconomic History  . Marital status: Single    Spouse name: Not on file  . Number of children: Not on file  . Years of education: Not on file  . Highest education level: Not on file  Occupational History  . Not on file  Social Needs  . Financial resource strain: Not on file  . Food insecurity:    Worry: Not on file    Inability: Not on file  . Transportation   needs:    Medical: Not on file    Non-medical: Not on file  Tobacco Use  . Smoking status: Current Every Day Smoker    Packs/day: 1.00    Years: 25.00    Pack years: 25.00    Types: Cigarettes  . Smokeless tobacco: Never Used  Substance and Sexual Activity  . Alcohol use: Yes    Alcohol/week: 1.0 standard drinks    Types: 1 Cans of beer per week    Comment: one beer weekly  . Drug use: Yes    Types: Marijuana, Cocaine    Comment: THC daily, Cocaine daily  . Sexual activity: Not  Currently  Lifestyle  . Physical activity:    Days per week: Not on file    Minutes per session: Not on file  . Stress: Not on file  Relationships  . Social connections:    Talks on phone: Not on file    Gets together: Not on file    Attends religious service: Not on file    Active member of club or organization: Not on file    Attends meetings of clubs or organizations: Not on file    Relationship status: Not on file  Other Topics Concern  . Not on file  Social History Narrative  . Not on file    Hospital Course:   05/27/18 [facility] MD Assessment:47 year old male with a past psychiatric history significant for depression, cocaine use disorder, marijuana use disorder and alcohol use disorder and a past medical history significant for poorly controlled diabetes mellitus type 1 who presented to the [facility] emergency department on 9/23 with a blood sugar of 19. The patient was found by his mother down at home. When EMS arrived his blood sugar was found to be 19. He was transported to the emergency room for evaluation and stabilization.During the course of the hospitalization and stabilization of his diabetes he admitted that he used cocaine and marijuana as well as alcohol. He stated he had been quite depressed over the last several months. He stated that he had had a break-up with a girlfriend that is been going on for several years, and that was very difficult for him. He was upset about unemployment, but recently got a job at [employer]. He admitted to a long-standing substance abuse history. He stated that his longest period of sobriety over the last several years was when he was at the [facility]. He stated that he was able to remain sober for approximately 4 years there. He denied any formal psychiatric admissions, and stated he had been in the emergency room on several occasions attempting to get help. He had been referred to [facility] on several occasions, but failed to do that.  He did admit to suicidal ideation to the psychiatric consultant while he was in the medical side of the hospital. During the interview today he was quite tearful and sad. He admitted to helplessness, hopelessness and worthlessness. He also has had some psychotic symptoms but only when he is using drugs. He was transferred to our facility for evaluation and stabilization.   Patient remained on the [facility] unit for 7 days. The patient stabilized on medication and therapy. Patient was discharged on Neurontin 100 mg TID, Vistaril 25 mg TID PRN, and Prozac 20 mg Daily. Patient has shown improvement with improved mood, affect, sleep, appetite, and interaction. Patient has attended group and participated. Patient has been seen in the day room interacting with peers and staff appropriately. Patient denies any  SI/HI/AVH and contracts for safety. Patient agrees to follow up at [facility] for his diabetes. Patient is provided with prescriptions for their medications upon discharge.   Physical Findings: AIMS: Facial and Oral Movements Muscles of Facial Expression: None, normal Lips and Perioral Area: None, normal Jaw: None, normal Tongue: None, normal,Extremity Movements Upper (arms, wrists, hands, fingers): None, normal Lower (legs, knees, ankles, toes): None, normal, Trunk Movements Neck, shoulders, hips: None, normal, Overall Severity Severity of abnormal movements (highest score from questions above): None, normal Incapacitation due to abnormal movements: None, normal Patient's awareness of abnormal movements (rate only patient's report): No Awareness, Dental Status Current problems with teeth and/or dentures?: No Does patient usually wear dentures?: No  CIWA:    COWS:     Musculoskeletal: Strength & Muscle Tone: within normal limits Gait & Station: normal Patient leans: Right  Psychiatric Specialty Exam: Physical Exam  Nursing note and vitals  reviewed. Constitutional: He is oriented to person, place, and time. He appears well-developed and well-nourished.  Cardiovascular: Normal rate.  Respiratory: Effort normal.  Musculoskeletal: Normal range of motion.  Neurological: He is alert and oriented to person, place, and time.  Skin: Skin is warm.    Review of Systems  Constitutional: Negative.   HENT: Negative.   Eyes: Negative.   Respiratory: Negative.   Cardiovascular: Negative.   Gastrointestinal: Negative.   Genitourinary: Negative.   Musculoskeletal: Negative.   Skin: Negative.   Neurological: Negative.   Endo/Heme/Allergies: Negative.   Psychiatric/Behavioral: Negative.     Blood pressure 132/79, pulse 80, temperature 98.2 F (36.8 C), temperature source Oral, resp. rate 16, height 5' 10" (1.778 m), weight 70 kg, SpO2 98 %.Body mass index is 22.14 kg/m.  General Appearance: Casual  Eye Contact:  Good  Speech:  Clear and Coherent and Normal Rate  Volume:  Normal  Mood:  Euthymic  Affect:  Congruent  Thought Process:  Goal Directed and Descriptions of Associations: Intact  Orientation:  Full (Time, Place, and Person)  Thought Content:  WDL  Suicidal Thoughts:  No  Homicidal Thoughts:  No  Memory:  Immediate;   Good Recent;   Good Remote;   Good  Judgement:  Fair  Insight:  Fair  Psychomotor Activity:  Normal  Concentration:  Concentration: Good and Attention Span: Good  Recall:  Good  Fund of Knowledge:  Good  Language:  Good  Akathisia:  No  Handed:  Right  AIMS (if indicated):     Assets:  Communication Skills Desire for Improvement Financial Resources/Insurance Housing Physical Health Social Support Transportation  ADL's:  Intact  Cognition:  WNL  Sleep:  Number of Hours: 6.25     Have you used any form of tobacco in the last 30 days? (Cigarettes, Smokeless Tobacco, Cigars, and/or Pipes): Yes  Has this patient used any form of tobacco in the last 30 days? (Cigarettes, Smokeless Tobacco,  Cigars, and/or Pipes) Yes, Yes, A prescription for an FDA-approved tobacco cessation medication was offered at discharge and the patient refused  Blood Alcohol level:  Lab Results  Component Value Date   ETH <10 05/24/2018   ETH <10 02/22/2018    Metabolic Disorder Labs:  Lab Results  Component Value Date   HGBA1C 10.6 (H) 05/26/2018   MPG 257.52 05/26/2018   MPG 280.48 01/25/2018   No results found for: PROLACTIN Lab Results  Component Value Date   CHOL 215 (H) 05/23/2014   TRIG 135 05/23/2014   HDL 67 05/23/2014     CHOLHDL 3.2 05/23/2014   VLDL 27 05/23/2014   LDLCALC 121 (H) 05/23/2014   LDLCALC 101 (H) 06/01/2009    See Psychiatric Specialty Exam and Suicide Risk Assessment completed by Attending Physician prior to discharge.  Discharge destination:  Home  Is patient on multiple antipsychotic therapies at discharge:  No   Has Patient had three or more failed trials of antipsychotic monotherapy by history:  No  Recommended Plan for Multiple Antipsychotic Therapies: NA   Allergies as of 06/03/2018   No Known Allergies     Medication List    TAKE these medications     Indication  blood glucose meter kit and supplies Dispense based on patient and insurance preference. Use up to four times daily as directed. (FOR ICD-10 E10.9, E11.9).  Indication:  Diabetes   cyclobenzaprine 5 MG tablet Commonly known as:  FLEXERIL Take 1 tablet (5 mg total) by mouth 3 (three) times daily as needed for muscle spasms.  Indication:  Muscle Spasm   FLUoxetine 20 MG capsule Commonly known as:  PROZAC Take 1 capsule (20 mg total) by mouth daily. For mood control What changed:    medication strength  how much to take  additional instructions  Indication:  mood stability   gabapentin 100 MG capsule Commonly known as:  NEURONTIN Take 1 capsule (100 mg total) by mouth 3 (three) times daily.  Indication:  Neuropathic Pain   hydrOXYzine 25 MG tablet Commonly known as:   ATARAX/VISTARIL Take 1 tablet (25 mg total) by mouth 3 (three) times daily as needed for itching, anxiety or nausea.  Indication:  Feeling Anxious   insulin aspart 100 UNIT/ML injection Commonly known as:  novoLOG Inject 0-9 Units into the skin 3 (three) times daily with meals.  Indication:  Insulin-Dependent Diabetes   insulin aspart protamine- aspart (70-30) 100 UNIT/ML injection Commonly known as:  NOVOLOG MIX 70/30 Inject 0.28 mLs (28 Units total) into the skin daily with supper. What changed:    how much to take  when to take this  Indication:  Insulin-Dependent Diabetes   insulin aspart protamine- aspart (70-30) 100 UNIT/ML injection Commonly known as:  NOVOLOG MIX 70/30 Inject 0.22 mLs (22 Units total) into the skin daily with breakfast. What changed:  You were already taking a medication with the same name, and this prescription was added. Make sure you understand how and when to take each.  Indication:  Insulin-Dependent Diabetes   Insulin Syringe-Needle U-100 29G X 1/2" 0.3 ML Misc Use as directed.  Indication:  Diabetes   traZODone 100 MG tablet Commonly known as:  DESYREL Take 1 tablet (100 mg total) by mouth at bedtime as needed for sleep.  Indication:  Trouble Sleeping      Follow-up [facility]. Go on 06/08/2018.   Why:  Appointment for outpatient medication management and therapy services is Tuesday, 06/08/18 at 8:00am. Please be sure to bring your Photo ID, SSN, any insurance information and any discharge paperwork from this hospitalization Contact information: [place] [address] [phone]        [facility]. Call on 06/07/2018.   Why:  PLEASE CALL to schedule intial appointment for primary care services. Hospital follow up appointments are only scheduled on Mondays. Please call Monday, 06/07/18 at 8:30am to schedule appointment for continuity of care for your Type 1 diabetes diagnosis.  Contact  information: [address] [place] [address] [phone]  Follow-up recommendations:  Continue activity as tolerated. Continue diet as recommended by your PCP. Ensure to keep all appointments with outpatient providers.  Comments:  Patient is instructed prior to discharge to: Take all medications as prescribed by his/her mental healthcare provider. Report any adverse effects and or reactions from the medicines to his/her outpatient provider promptly. Patient has been instructed & cautioned: To not engage in alcohol and or illegal drug use while on prescription medicines. In the event of worsening symptoms, patient is instructed to call the crisis hotline, 911 and or go to the nearest ED for appropriate evaluation and treatment of symptoms. To follow-up with his/her primary care provider for your other medical issues, concerns and or health care needs.    Signed: Travis B Money, FNP 06/04/2018, 8:08 AM   Patient seen, Suicide Assessment Completed.  Disposition Plan Reviewed  

## 2018-06-03 NOTE — Progress Notes (Addendum)
Patient does not have a primary care provider for follow up on his Type 1 diabetes diagnosis.   CSW attempted to refer the patient to [facility] for primary care services and continuity of care at discharge. According to the receptionist, hospital follow up appointments are only scheduled on Mondays for new clients.   The receptionist requested that the patient call Monday morning (06/07/18) during operating hours (8:30am-5:30pm) in order for him to schedule his initial  appointment. CSW shared this information with the patient, and he was agreeable to follow up on Monday morning.   CSW charted [facility] contact information and address information in the patient's AVS.   CSW informed the patient's attending physician, Dr. Nehemiah Massed, MD.     Baldo Daub, MSW, LCSWA Clinical Social Worker [facility]  Phone: [phone]

## 2018-06-03 NOTE — Progress Notes (Signed)
Patient is being discharged today, charge recommendation , patient at baseline on Novolin 70/30 15 units twice daily, but he is noncompliant with his medication during his [facility] stay, have labile numbers, CBG going postprandial 400-500, most recent CBG 234, patient can be discharged today on Novolg  70/30 , 22 units subcu with breakfast, and 28 units subcu before supper. Huey Bienenstock MD Pager [phone]

## 2018-06-03 NOTE — Progress Notes (Signed)
CBG collected. CBG 526. Provider notified. One time order of 2 units of Novolog plus SS 9 units of novolog and 22 units of 70/30. Will continue to monitor.

## 2018-06-03 NOTE — [facility] Suicide Risk Assessment (Addendum)
[facility] Discharge Suicide Risk Assessment   Principal Problem: Depression Discharge Diagnoses:  Patient Active Problem List   Diagnosis Date Noted  . MDD (major depressive disorder), severe (HCC) [F32.2] 05/27/2018  . Tachy-brady syndrome (HCC) [I49.5] 05/26/2018  . Hypoglycemia [E16.2] 05/24/2018  . DKA, type 1 (HCC) [E10.10] 03/31/2018  . Hyperglycemia due to type 1 diabetes mellitus (HCC) [E10.65] 02/23/2018  . Cocaine dependence with cocaine-induced mood disorder (HCC) [F14.24]   . MDD (major depressive disorder), recurrent severe, without psychosis (HCC) [F33.2] 02/22/2018  . Malnutrition of moderate degree [E44.0] 07/19/2015  . AKI (acute kidney injury) (HCC) [N17.9] 07/18/2015  . Marijuana abuse [F12.10] 07/18/2015  . Dehydration [E86.0] 07/18/2015  . Dental caries [K02.9] 07/18/2015  . Hyperlipidemia associated with type 2 diabetes mellitus (HCC) [E11.69, E78.5] 05/24/2014  . Polysubstance abuse (HCC) [F19.10] 05/13/2014  . Major depression, recurrent (HCC) [F33.9] 05/13/2014  . Suicidal ideation [R45.851] 05/13/2014  . ANTICARDIOLIPIN ANTIBODY SYNDROME [R89.4] 05/12/2007  . Type 1 diabetes mellitus (HCC) [E10.9] 04/26/2007  . TOBACCO ABUSE [F17.200] 04/26/2007  . 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 rate 16, height 5\' 10"  (1.778 m), weight 70 kg, SpO2 98 %.Body mass index is 22.14 kg/m.  General Appearance: Fairly Groomed  Patent attorney::  Good  Speech:  Normal Rate409  Volume:  Normal  Mood:  reports mood is better, improved   Affect:  becoming more reactive, smiles at times appropriately   Thought Process:  Linear and  Descriptions of Associations: Intact  Orientation:  Full (Time, Place, and Person)- he is currently alert, attentive, and oriented x 3   Thought Content:  no hallucinations , no delusions   Suicidal Thoughts:  No denies suicidal or self injurious ideations, no homicidal or violent ideations  Homicidal Thoughts:  No  Memory:  recent and remote grossly intact   Judgement:  Other:  improving   Insight:  improving   Psychomotor Activity:  Normal  Concentration:  Good  Recall:  Good  Fund of Knowledge:Good  Language: Good  Akathisia:  Negative  Handed:  Right  AIMS (if indicated):     Assets:  Desire for Improvement Resilience  Sleep:  Number of Hours: 6.25  Cognition: WNL  ADL's:  Intact   Mental Status Per Nursing Assessment::   On Admission:  Suicidal ideation indicated by patient  Demographic Factors:  47 year old male, currently homeless ( reports had been living with mother but cannot currently return there after discharge)  Loss Factors: Housing, employment stressors, chronic illness ( DM), substance abuse   Historical Factors: History of depression, history of substance abuse -cocaine, alcohol  History of DM I   Risk Reduction Factors:   Positive coping skills or problem solving skills  Continued Clinical Symptoms:  Patient reports feeling better than on admission, reports improving mood, and affect presents more reactive. Denies suicidal or self injurious ideations at this time, denies homicidal or violent ideations, and denies hallucinations , no delusions . Currently future oriented, states he plans to go to shelter and to work on employment. Describes feeling motivated in remaining sober/abstinent  Behavior on unit in good control. DM has been poorly controlled- I have reviewed today  with hospitalist consultant, who have been following for help with management. No current contraindication from medical standpoint for discharge today.   Cognitive Features That  Contribute To Risk:  No gross cognitive deficits noted upon discharge. Is alert , attentive, and oriented x 3   Suicide Risk:  Mild:  Suicidal ideation of limited frequency, intensity, duration, and specificity.  There are no identifiable plans, no associated intent, mild dysphoria and related symptoms, good self-control (both objective and subjective assessment), few other risk factors, and identifiable protective factors, including available and accessible social support.  Follow-up Information    [facility]. Go on 06/08/2018.   Why:  Appointment for medication management and therapy services is Tuesday, 06/08/18 at 8:00am. Please be sure to bring your Photo ID, SSN, any insurance information and any discharge paperwork from this hospitalization Contact information: [address] [place] [address] [phone]           Plan Of Care/Follow-up recommendations:  Activity:  as tolerated  Diet:  diabetic diet  Tests:  NA Other:  See below'  Patient is expressing improvement and readiness for discharge- no current grounds for involuntary commitment  Plans to go to shelter and follow up as above . Plans to [facility] up at [facility] for ongoing medical , diabetic management. Importance of following Diabetic Diet reviewed .  12 step program participation and avoiding people, places and situations associated with drug use reviewed . Craige Cotta, MD 06/03/2018, 10:21 AM

## 2018-06-26 ENCOUNTER — Observation Stay (HOSPITAL_COMMUNITY)
Admission: EM | Admit: 2018-06-26 | Discharge: 2018-06-27 | Disposition: A | Discharge status: Home / Self Care | Payer: Self-pay | Attending: Family Medicine | Admitting: Family Medicine

## 2018-06-26 DIAGNOSIS — R252 Cramp and spasm: Secondary | ICD-10-CM | POA: Insufficient documentation

## 2018-06-26 DIAGNOSIS — F1410 Cocaine abuse, uncomplicated: Secondary | ICD-10-CM | POA: Insufficient documentation

## 2018-06-26 DIAGNOSIS — T50901A Poisoning by unspecified drugs, medicaments and biological substances, accidental (unintentional), initial encounter: Secondary | ICD-10-CM | POA: Diagnosis present

## 2018-06-26 DIAGNOSIS — Y900 Blood alcohol level of less than 20 mg/100 ml: Secondary | ICD-10-CM | POA: Insufficient documentation

## 2018-06-26 DIAGNOSIS — T50904A Poisoning by unspecified drugs, medicaments and biological substances, undetermined, initial encounter: Secondary | ICD-10-CM

## 2018-06-26 DIAGNOSIS — G92 Toxic encephalopathy: Secondary | ICD-10-CM | POA: Insufficient documentation

## 2018-06-26 DIAGNOSIS — E1065 Type 1 diabetes mellitus with hyperglycemia: Secondary | ICD-10-CM | POA: Insufficient documentation

## 2018-06-26 DIAGNOSIS — F1010 Alcohol abuse, uncomplicated: Secondary | ICD-10-CM | POA: Insufficient documentation

## 2018-06-26 DIAGNOSIS — I959 Hypotension, unspecified: Secondary | ICD-10-CM | POA: Insufficient documentation

## 2018-06-26 DIAGNOSIS — F17210 Nicotine dependence, cigarettes, uncomplicated: Secondary | ICD-10-CM | POA: Insufficient documentation

## 2018-06-26 DIAGNOSIS — I9589 Other hypotension: Secondary | ICD-10-CM

## 2018-06-26 DIAGNOSIS — F419 Anxiety disorder, unspecified: Secondary | ICD-10-CM | POA: Insufficient documentation

## 2018-06-26 DIAGNOSIS — F1910 Other psychoactive substance abuse, uncomplicated: Principal | ICD-10-CM | POA: Insufficient documentation

## 2018-06-26 DIAGNOSIS — Z79899 Other long term (current) drug therapy: Secondary | ICD-10-CM | POA: Insufficient documentation

## 2018-06-26 DIAGNOSIS — F319 Bipolar disorder, unspecified: Secondary | ICD-10-CM | POA: Insufficient documentation

## 2018-06-26 DIAGNOSIS — N179 Acute kidney failure, unspecified: Secondary | ICD-10-CM | POA: Insufficient documentation

## 2018-06-26 DIAGNOSIS — E860 Dehydration: Secondary | ICD-10-CM | POA: Insufficient documentation

## 2018-06-26 DIAGNOSIS — Z794 Long term (current) use of insulin: Secondary | ICD-10-CM | POA: Insufficient documentation

## 2018-06-26 DIAGNOSIS — F1210 Cannabis abuse, uncomplicated: Secondary | ICD-10-CM | POA: Insufficient documentation

## 2018-06-26 LAB — CBC WITH DIFFERENTIAL/PLATELET
Abs Immature Granulocytes: 0.01 10*3/uL (ref 0.00–0.07)
BASOS ABS: 0 10*3/uL (ref 0.0–0.1)
Basophils Relative: 1 %
EOS ABS: 0.1 10*3/uL (ref 0.0–0.5)
Eosinophils Relative: 2 %
HEMATOCRIT: 45.6 % (ref 39.0–52.0)
Hemoglobin: 14.6 g/dL (ref 13.0–17.0)
IMMATURE GRANULOCYTES: 0 %
LYMPHS ABS: 1.3 10*3/uL (ref 0.7–4.0)
Lymphocytes Relative: 28 %
MCH: 27.4 pg (ref 26.0–34.0)
MCHC: 32 g/dL (ref 30.0–36.0)
MCV: 85.7 fL (ref 80.0–100.0)
Monocytes Absolute: 0.4 10*3/uL (ref 0.1–1.0)
Monocytes Relative: 9 %
NEUTROS PCT: 60 %
NRBC: 0 % (ref 0.0–0.2)
Neutro Abs: 2.9 10*3/uL (ref 1.7–7.7)
Platelets: 326 10*3/uL (ref 150–400)
RBC: 5.32 MIL/uL (ref 4.22–5.81)
RDW: 13.4 % (ref 11.5–15.5)
WBC: 4.7 10*3/uL (ref 4.0–10.5)

## 2018-06-26 LAB — CK: CK TOTAL: 272 U/L (ref 49–397)

## 2018-06-26 LAB — COMPREHENSIVE METABOLIC PANEL
ALT: 30 U/L (ref 0–44)
AST: 28 U/L (ref 15–41)
Albumin: 4.1 g/dL (ref 3.5–5.0)
Alkaline Phosphatase: 58 U/L (ref 38–126)
Anion gap: 10 (ref 5–15)
BUN: 15 mg/dL (ref 6–20)
CHLORIDE: 97 mmol/L — AB (ref 98–111)
CO2: 29 mmol/L (ref 22–32)
Calcium: 9.1 mg/dL (ref 8.9–10.3)
Creatinine, Ser: 1.63 mg/dL — ABNORMAL HIGH (ref 0.61–1.24)
GFR calc non Af Amer: 48 mL/min — ABNORMAL LOW (ref >60)
GFR, EST AFRICAN AMERICAN: 56 mL/min — AB (ref >60)
Glucose, Bld: 178 mg/dL — ABNORMAL HIGH (ref 70–99)
Potassium: 3.8 mmol/L (ref 3.5–5.1)
SODIUM: 136 mmol/L (ref 135–145)
Total Bilirubin: 1.3 mg/dL — ABNORMAL HIGH (ref 0.3–1.2)
Total Protein: 6.7 g/dL (ref 6.5–8.1)

## 2018-06-26 LAB — GLUCOSE, CAPILLARY
GLUCOSE-CAPILLARY: 158 mg/dL — AB (ref 70–99)
GLUCOSE-CAPILLARY: 461 mg/dL — AB (ref 70–99)
GLUCOSE-CAPILLARY: 255 mg/dL — AB (ref 70–99)
Glucose-Capillary: 555 mg/dL (ref 70–99)
Glucose-Capillary: 446 mg/dL — ABNORMAL HIGH (ref 70–99)
Glucose-Capillary: 362 mg/dL — ABNORMAL HIGH (ref 70–99)

## 2018-06-26 LAB — RAPID URINE DRUG SCREEN, HOSP PERFORMED
AMPHETAMINES: NOT DETECTED
Barbiturates: NOT DETECTED
Benzodiazepines: NOT DETECTED
Cocaine: POSITIVE — AB
OPIATES: NOT DETECTED
Tetrahydrocannabinol: POSITIVE — AB

## 2018-06-26 LAB — ETHANOL

## 2018-06-26 MED ORDER — INSULIN ASPART 100 UNIT/ML ~~LOC~~ SOLN
15.0000 [IU] | Freq: Once | SUBCUTANEOUS | Status: AC
  Administered 2018-06-26: 15 [IU] via SUBCUTANEOUS

## 2018-06-26 MED ORDER — SODIUM CHLORIDE 0.9 % IV BOLUS
2000.0000 mL | Freq: Once | INTRAVENOUS | Status: AC
  Administered 2018-06-26: 2000 mL via INTRAVENOUS

## 2018-06-26 MED ORDER — VITAMIN B-1 100 MG PO TABS
100.0000 mg | ORAL_TABLET | Freq: Every day | ORAL | Status: DC
  Administered 2018-06-27: 100 mg via ORAL
  Filled 2018-06-26: qty 1

## 2018-06-26 MED ORDER — ADULT MULTIVITAMIN W/MINERALS CH
1.0000 | ORAL_TABLET | Freq: Every day | ORAL | Status: DC
  Administered 2018-06-27: 1 via ORAL
  Filled 2018-06-26: qty 1

## 2018-06-26 MED ORDER — ENOXAPARIN SODIUM 40 MG/0.4ML ~~LOC~~ SOLN
40.0000 mg | SUBCUTANEOUS | Status: DC
  Administered 2018-06-27: 40 mg via SUBCUTANEOUS
  Filled 2018-06-26: qty 0.4

## 2018-06-26 MED ORDER — SODIUM CHLORIDE 0.9 % IV BOLUS
1000.0000 mL | Freq: Once | INTRAVENOUS | Status: AC
  Administered 2018-06-26: 1000 mL via INTRAVENOUS

## 2018-06-26 MED ORDER — LORAZEPAM 2 MG/ML IJ SOLN: 1.0000 mg | Freq: Four times a day (QID) | INTRAMUSCULAR | Status: DC | PRN

## 2018-06-26 MED ORDER — NITROGLYCERIN 0.4 MG SL SUBL
SUBLINGUAL_TABLET | SUBLINGUAL | Status: AC
  Filled 2018-06-26: qty 1

## 2018-06-26 MED ORDER — INSULIN ASPART PROT & ASPART (70-30 MIX) 100 UNIT/ML ~~LOC~~ SUSP
10.0000 [IU] | Freq: Two times a day (BID) | SUBCUTANEOUS | Status: DC
  Administered 2018-06-27: 10 [IU] via SUBCUTANEOUS
  Filled 2018-06-26: qty 10

## 2018-06-26 MED ORDER — MORPHINE SULFATE (PF) 2 MG/ML IV SOLN
INTRAVENOUS | Status: AC
  Filled 2018-06-26: qty 1

## 2018-06-26 MED ORDER — METOPROLOL TARTRATE 5 MG/5ML IV SOLN
INTRAVENOUS | Status: AC
  Filled 2018-06-26: qty 5

## 2018-06-26 MED ORDER — LORAZEPAM 1 MG PO TABS: 1.0000 mg | ORAL_TABLET | Freq: Four times a day (QID) | ORAL | Status: DC | PRN

## 2018-06-26 MED ORDER — ASPIRIN 81 MG PO CHEW
CHEWABLE_TABLET | ORAL | Status: AC
  Filled 2018-06-26: qty 4

## 2018-06-26 MED ORDER — FOLIC ACID 1 MG PO TABS
1.0000 mg | ORAL_TABLET | Freq: Every day | ORAL | Status: DC
  Administered 2018-06-27: 1 mg via ORAL
  Filled 2018-06-26: qty 1

## 2018-06-26 MED ORDER — SODIUM CHLORIDE 0.9 % IV SOLN
INTRAVENOUS | Status: DC
  Administered 2018-06-26 – 2018-06-27 (×3): via INTRAVENOUS

## 2018-06-26 MED ORDER — THIAMINE HCL 100 MG/ML IJ SOLN: 100.0000 mg | Freq: Every day | INTRAMUSCULAR | Status: DC

## 2018-06-26 MED ORDER — INSULIN ASPART 100 UNIT/ML ~~LOC~~ SOLN
0.0000 [IU] | Freq: Three times a day (TID) | SUBCUTANEOUS | Status: DC
  Administered 2018-06-26: 11 [IU] via SUBCUTANEOUS
  Administered 2018-06-27 (×2): 15 [IU] via SUBCUTANEOUS

## 2018-06-26 NOTE — ED Notes (Addendum)
Patient able to bend and move both legs. Patient c/o of cramping in right calf and shin. Md made aware.

## 2018-06-26 NOTE — ED Notes (Addendum)
Provider at bedside. Per MD continue to monitor. MD will re-evaluate after fluids.

## 2018-06-26 NOTE — Progress Notes (Signed)
Pt has arrived to unit on stretcher from Emergency Room. Pt is A&O x 4. Pt is drowsy, Telemetry placed and orders implemented. Pt refused ADMISSION HX to be completed.

## 2018-06-26 NOTE — ED Notes (Addendum)
Patient sleeping and snoring

## 2018-06-26 NOTE — ED Notes (Addendum)
Patient A/Ox4. Patient knows his name, date of birth, place (hospital), and president. Attempted to doppler patients pulses. Pulses heard in popliteal. None heard on dorsalis pedis or anterior tibialis. MD made aware. MD able to use doppler and hear popliteal but not on feet. J,MD wants to give patient fluids and recheck again. Patient was also able to roll his body to his side but no all the way to his back.

## 2018-06-26 NOTE — Progress Notes (Signed)
The patient continues to refuse to talk to pharmacy about his home medications. The meds on his PTA med list were all prescribed recently but per [facility] pharmacy he never picked any of them up. I'm unable to confirm if he picked up the cyclobenzaprine from [facility] since they are closed.   Charolotte Eke, PharmD. Mobile: [phone]. 06/26/2018,5:10 PM.

## 2018-06-26 NOTE — Progress Notes (Signed)
Pt refused to answer questions to be admiited to hospital.

## 2018-06-26 NOTE — ED Notes (Signed)
ED TO INPATIENT HANDOFF REPORT  Name/Age/Gender John Cox 47 y.o. male  Code Status    Code Status Orders  (From admission, onward)         Start     Ordered   06/26/18 0953  Full code  Continuous     06/26/18 0952        Code Status History    Date Active Date Inactive Code Status Order ID Comments User Context   05/27/2018 1627 06/03/2018 2025 Full Code [id number]  Sharma Covert, MD Inpatient   05/27/2018 1427 05/27/2018 1627 Full Code [id number]  Consuello Closs, NP Inpatient   05/24/2018 2001 05/27/2018 1417 Full Code [id number]  Norval Morton, MD ED   03/31/2018 1812 04/02/2018 1909 Full Code [id number]  Dessa Phi, DO Inpatient   02/22/2018 1442 02/22/2018 1913 Full Code [id number]  Niel Hummer, NP Inpatient   01/25/2018 0105 01/26/2018 1441 Full Code [id number]  Jani Gravel, MD ED   11/13/2017 2146 11/15/2017 1600 Full Code [id number]  Toy Baker, MD Inpatient   07/18/2015 1802 07/19/2015 1919 Full Code [id number]  Modena Jansky, MD Inpatient   05/14/2014 2357 05/18/2014 2018 Full Code [id number]  Berle Mull, MD ED   05/13/2014 1244 05/14/2014 1939 Full Code [id number]  Piepenbrink, [facility]la [facility] ED      Home/SNF/Other Home  Chief Complaint No admission diagnoses are documented for this encounter.  Level of Care/Admitting Diagnosis ED Disposition    ED Disposition Condition Comment   Admit  Hospital Area: [facility] [[id number]]  Level of Care: Telemetry [5]  Admit to tele based on following criteria: Monitor for Ischemic changes  Diagnosis: Overdose [470962]  Admitting Physician: Georgette Shell [[id number]]  Attending Physician: Georgette Shell [[id number]]  PT Class (Do Not Modify): Observation [104]  PT Acc Code (Do Not Modify): Observation [10022]       Medical History Past Medical History:  Diagnosis Date  . AKI (acute kidney injury) ([place]) 01/25/2018  . Anxiety   . Bipolar disorder ([place])   . Depression 2000  .  Diabetes mellitus 1995  . Hyperglycemia 01/25/2018  . Nausea & vomiting   . Polysubstance abuse ([place]) 2012   relapse 2 wk ago   . Suicidal ideation     Allergies No Known Allergies  IV Location/Drains/Wounds Patient Lines/Drains/Airways Status   Active Line/Drains/Airways    Name:   Placement date:   Placement time:   Site:   Days:   Peripheral IV 06/26/18 Right Antecubital   06/26/18    0704    Antecubital   less than 1          Labs/Imaging Results for orders placed or performed during the hospital encounter of 06/26/18 (from the past 48 hour(s))  Comprehensive metabolic panel     Status: Abnormal   Collection Time: 06/26/18  7:11 AM  Result Value Ref Range   Sodium 136 135 - 145 mmol/L   Potassium 3.8 3.5 - 5.1 mmol/L   Chloride 97 (L) 98 - 111 mmol/L   CO2 29 22 - 32 mmol/L   Glucose, Bld 178 (H) 70 - 99 mg/dL   BUN 15 6 - 20 mg/dL   Creatinine, Ser 1.63 (H) 0.61 - 1.24 mg/dL   Calcium 9.1 8.9 - 10.3 mg/dL   Total Protein 6.7 6.5 - 8.1 g/dL   Albumin 4.1 3.5 - 5.0 g/dL   AST 28 15 - 41 U/L   ALT 30 0 - 44  U/L   Alkaline Phosphatase 58 38 - 126 U/L   Total Bilirubin 1.3 (H) 0.3 - 1.2 mg/dL   GFR calc non Af Amer 48 (L) >60 mL/min   GFR calc Af Amer 56 (L) >60 mL/min    Comment: (NOTE) The eGFR has been calculated using the CKD EPI equation. This calculation has not been validated in all clinical situations. eGFR's persistently <60 mL/min signify possible Chronic Kidney Disease.    Anion gap 10 5 - 15    Comment: Performed at [facility], [place] [address], [place], [place] [address]  CBC with Differential/Platelet     Status: None   Collection Time: 06/26/18  7:11 AM  Result Value Ref Range   WBC 4.7 4.0 - 10.5 K/uL   RBC 5.32 4.22 - 5.81 MIL/uL   Hemoglobin 14.6 13.0 - 17.0 g/dL   HCT 45.6 39.0 - 52.0 %   MCV 85.7 80.0 - 100.0 fL   MCH 27.4 26.0 - 34.0 pg   MCHC 32.0 30.0 - 36.0 g/dL   RDW 13.4 11.5 - 15.5 %   Platelets 326 150 - 400  K/uL   nRBC 0.0 0.0 - 0.2 %   Neutrophils Relative % 60 %   Neutro Abs 2.9 1.7 - 7.7 K/uL   Lymphocytes Relative 28 %   Lymphs Abs 1.3 0.7 - 4.0 K/uL   Monocytes Relative 9 %   Monocytes Absolute 0.4 0.1 - 1.0 K/uL   Eosinophils Relative 2 %   Eosinophils Absolute 0.1 0.0 - 0.5 K/uL   Basophils Relative 1 %   Basophils Absolute 0.0 0.0 - 0.1 K/uL   Immature Granulocytes 0 %   Abs Immature Granulocytes 0.01 0.00 - 0.07 K/uL    Comment: Performed at [facility], [place] [address], [place], [place] [address]  CK     Status: None   Collection Time: 06/26/18  7:11 AM  Result Value Ref Range   Total CK 272 49 - 397 U/L    Comment: Performed at [facility], [place] [address] [place], [place] [address]  Ethanol     Status: None   Collection Time: 06/26/18  7:12 AM  Result Value Ref Range   Alcohol, Ethyl (B) <10 <10 mg/dL    Comment: (NOTE) Lowest detectable limit for serum alcohol is 10 mg/dL. For medical purposes only. Performed at [facility], [place] [address], [place], [place] [address]    No results found. EKG Interpretation  Date/Time:  Saturday June 26 2018 06:49:17 EDT Ventricular Rate:  76 PR Interval:    QRS Duration: 90 QT Interval:  425 QTC Calculation: 478 R Axis:   83 Text Interpretation:  Sinus rhythm Consider left ventricular hypertrophy Inferior infarct, acute (LCx) ST elevation, consider anterolateral injury early repolarization No significant change since last tracing Confirmed by Orlie Dakin [phone]) on 06/26/2018 7:01:01 AM   Pending Labs Unresulted Labs (From admission, onward)    Start     Ordered   06/27/18 0500  Comprehensive metabolic panel  Tomorrow morning,   R     06/26/18 0952   06/27/18 0500  CBC  Tomorrow morning,   R     06/26/18 0952          Vitals/Pain Today's Vitals   06/26/18 0740 06/26/18 0745 06/26/18 0800 06/26/18 0815  BP: 107/69 115/74 111/69 102/63  Pulse:  72     Resp: 15 17 13 15   SpO2: 96% 96% (!) 88%  91%  Weight:      Height:        Isolation Precautions No active isolations  Medications Medications  enoxaparin (LOVENOX) injection 30 mg (has no administration in time range)  insulin aspart (novoLOG) injection 0-15 Units (has no administration in time range)  sodium chloride 0.9 % bolus 2,000 mL (0 mLs Intravenous Stopped 06/26/18 0828)  sodium chloride 0.9 % bolus 1,000 mL (1,000 mLs Intravenous New Bag/Given 06/26/18 0933)    Mobility walks

## 2018-06-26 NOTE — ED Provider Notes (Signed)
[facility] DEPT Provider Note   CSN: [id number] Arrival date & time: 06/26/18  2426  Level 5 caveat unstable vital signs   History   Chief Complaint Chief Complaint  Patient presents with  . Leg Pain    HPI John Cox is a 47 y.o. male.  Complains of bilateral leg cramps below the knees onset 2 or 3 hours ago.  Brought by EMS makes symptoms better or worse.  Patient had systolic blood pressure 72 in the field.  No treatment prior to coming here.  Nothing makes symptoms better or worse.  He missed to smoking and sniffing cocaine last night and also smoking marijuana and drinking alcohol.  Denies chest pain denies abdominal pain denies shortness of breath denies headache no other associated symptoms HPI  Past Medical History:  Diagnosis Date  . AKI (acute kidney injury) ([place]) 01/25/2018  . Anxiety   . Bipolar disorder ([place])   . Depression 2000  . Diabetes mellitus 1995  . Hyperglycemia 01/25/2018  . Nausea & vomiting   . Polysubstance abuse ([place]) 2012   relapse 2 wk ago   . Suicidal ideation     Patient Active Problem List   Diagnosis Date Noted  . MDD (major depressive disorder), severe ([place]) 05/27/2018  . Tachy-brady syndrome ([place]) 05/26/2018  . Hypoglycemia 05/24/2018  . DKA, type 1 ([place]) 03/31/2018  . Hyperglycemia due to type 1 diabetes mellitus ([place]) 02/23/2018  . Cocaine dependence with cocaine-induced mood disorder ([place])   . MDD (major depressive disorder), recurrent severe, without psychosis ([place]) 02/22/2018  . Malnutrition of moderate degree 07/19/2015  . AKI (acute kidney injury) ([place]) 07/18/2015  . Marijuana abuse 07/18/2015  . Dehydration 07/18/2015  . Dental caries 07/18/2015  . Hyperlipidemia associated with type 2 diabetes mellitus ([place]) 05/24/2014  . Polysubstance abuse ([place]) 05/13/2014  . Major depression, recurrent ([place]) 05/13/2014  . Suicidal ideation 05/13/2014  . ANTICARDIOLIPIN ANTIBODY SYNDROME 05/12/2007    . Type 1 diabetes mellitus ([place]) 04/26/2007  . TOBACCO ABUSE 04/26/2007  . 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 NO PAST SURGERIES          Home Medications    Prior to Admission medications   Medication Sig Start Date End Date Taking? Authorizing Provider  blood glucose meter kit and supplies Dispense based on patient and insurance preference. Use up to four times daily as directed. (FOR ICD-10 E10.9, E11.9). 04/02/18   Dessa Phi, DO  cyclobenzaprine (FLEXERIL) 5 MG tablet Take 1 tablet (5 mg total) by mouth 3 (three) times daily as needed for muscle spasms. 05/27/18 06/26/18  Donne Hazel, MD  FLUoxetine (PROZAC) 20 MG capsule Take 1 capsule (20 mg total) by mouth daily. For mood control 06/03/18   Money, Lowry Ram, FNP  gabapentin (NEURONTIN) 100 MG capsule Take 1 capsule (100 mg total) by mouth 3 (three) times daily. 06/03/18 07/03/18  Money, Lowry Ram, FNP  hydrOXYzine (ATARAX/VISTARIL) 25 MG tablet Take 1 tablet (25 mg total) by mouth 3 (three) times daily as needed for itching, anxiety or nausea. 06/03/18   Money, Lowry Ram, FNP  insulin aspart (NOVOLOG) 100 UNIT/ML injection Inject 0-9 Units into the skin 3 (three) times daily with meals. 04/02/18   Dessa Phi, DO  insulin aspart protamine- aspart (NOVOLOG MIX 70/30) (70-30) 100 UNIT/ML injection Inject 0.28 mLs (28 Units total) into the skin daily with supper. 06/03/18   Money, Lowry Ram, FNP  insulin aspart protamine- aspart (NOVOLOG MIX 70/30) (70-30) 100 UNIT/ML injection Inject 0.22 mLs (22 Units total) into the skin daily with breakfast. 06/04/18   Money, Lowry Ram, FNP  Insulin Syringe-Needle U-100 (SAFETY-GLIDE 0.3CC SYR 29GX1/2) 29G X 1/2" 0.3 ML MISC Use as directed. Patient not taking: Reported on 05/24/2018 04/02/18   Dessa Phi, DO  traZODone (DESYREL) 100 MG tablet Take 1 tablet (100 mg total) by mouth at bedtime as needed for sleep. 06/03/18   Money, Lowry Ram, FNP     Family History Family History  Problem Relation Age of Onset  . Diabetes Mother   . COPD Mother   . Heart disease Mother     Social History Social History   Tobacco Use  . Smoking status: Current Every Day Smoker    Packs/day: 1.00    Years: 25.00    Pack years: 25.00    Types: Cigarettes  . Smokeless tobacco: Never Used  Substance Use Topics  . Alcohol use: Yes    Alcohol/week: 1.0 standard drinks    Types: 1 Cans of beer per week    Comment: one beer weekly  . Drug use: Yes    Types: Marijuana, Cocaine    Comment: THC daily, Cocaine daily     Allergies   Patient has no known allergies.   Review of Systems Review of Systems  Unable to perform ROS: Unstable vital signs  Musculoskeletal: Positive for myalgias.  All other systems reviewed and are negative.    Physical Exam Updated Vital Signs BP (!) 98/58 (BP Location: Left Arm)   Pulse 72   Resp 20   Ht 5' 11"  (1.803 m)   Wt 69.9 kg   SpO2 97%   BMI 21.48 kg/m   Physical Exam  Constitutional:  Chronically ill-appearing  HENT:  Head: Normocephalic and atraumatic.  Eyes: Pupils are equal, round, and reactive to light. Conjunctivae are normal.  Neck: Neck supple. No tracheal deviation present. No thyromegaly present.  Cardiovascular: Normal rate, regular rhythm and normal heart sounds.  No murmur heard. PT and DP pulses absent bilaterally.  Popliteal pulses 1+ bilaterally femoral pulses 2+ bilaterally radial pulses 1+ bilaterally  Pulmonary/Chest: Effort normal and breath sounds normal.  Abdominal: Soft. Bowel sounds are normal. He exhibits no distension. There is no tenderness.  Musculoskeletal: Normal range of motion. He exhibits no edema or tenderness.  Neurological: Coordination normal.  Sleepy arousable to verbal stimulus moves all extremities cranial nerves II through XII grossly intact.  Skin: Skin is warm and dry. No rash noted.  Psychiatric: He has a normal mood and affect.  Nursing note  and vitals reviewed.    ED Treatments / Results  Labs (all labs ordered are listed, but only abnormal results are displayed) Labs Reviewed  COMPREHENSIVE METABOLIC PANEL  CBC WITH DIFFERENTIAL/PLATELET  CK  ETHANOL    EKG EKG Interpretation  Date/Time:  Saturday June 26 2018 06:49:17 EDT Ventricular Rate:  76 PR Interval:    QRS Duration: 90 QT Interval:  425 QTC Calculation: 478 R Axis:   83 Text Interpretation:  Sinus rhythm Consider left ventricular hypertrophy Inferior infarct, acute (LCx) ST elevation, consider anterolateral injury early repolarization No significant change since last tracing Confirmed by Orlie Dakin [phone]) on 06/26/2018 7:01:01 AM   Radiology No results found.  Procedures Procedures (including critical care time)  Medications Ordered in ED Medications  sodium chloride 0.9 % bolus 2,000 mL (has no administration in time range)     Initial Impression /  Assessment and Plan / ED Course  I have reviewed the triage vital signs and the nursing notes.  Pertinent labs & imaging results that were available during my care of the patient were reviewed by me and considered in my medical decision making (see chart for details).     Lab work consistent with acute kidney injury.   9:35 PM patient is sleepy arousable to verbal stimulus.  Complains of generally feeling poorly.  Denies specific pain anywhere.  On reexamination of legs.  DP pulse of right foot now 1+ PT pulse 1+.  Left lower extremity PT pulse one plus DP pulse absent.  Toes with good capillary refill..  Assessment.  Patient hypotensive on arrival.  Likely secondary to dehydration.  His peripheral pulses returned and his legs and leg symptoms improved after intravenous hydration.  Do not feel the patient needs emergent vascular study.  Lab work consistent with acute kidney injury And hyperglycemia Plan 10-year intravenous hydration.  I consulted Dr. Zigmund Daniel from hospital service will  arrange for overnight stay Final Clinical Impressions(s) / ED Diagnoses  Diagnoses #1 hypotension  #2 acute kidney injury #3 leg cramps #4 hyperglycemia #5 polysubstance abuse CRITICAL CARE Performed by: Orlie Dakin Total critical care time: 30 minutes Critical care time was exclusive of separately billable procedures and treating other patients. Critical care was necessary to treat or prevent imminent or life-threatening deterioration. Critical care was time spent personally by me on the following activities: development of treatment plan with patient and/or surrogate as well as nursing, discussions with consultants, evaluation of patient's response to treatment, examination of patient, obtaining history from patient or surrogate, ordering and performing treatments and interventions, ordering and review of laboratory studies, ordering and review of radiographic studies, pulse oximetry and re-evaluation of patient's condition. Final diagnoses:  None    ED Discharge Orders    None       Orlie Dakin, MD 06/26/18 [phone]

## 2018-06-26 NOTE — Progress Notes (Signed)
Pts CBG is 555. 11units given per sliding scale and Dr Ashley Royalty notified. New Orders being placed by her.

## 2018-06-26 NOTE — ED Triage Notes (Signed)
Patient arrives by [facility] with complaints of bilateral leg pain-patient using cocaine,alcohol and marijuana. Hx Diabetes and patient with SBP 72-12 lead EKG shows changes anterior/lateral leads-stat 12 lead done and given to Dr. Read Drivers.

## 2018-06-26 NOTE — H&P (Signed)
History and Physical    CHRISTORPHER HISAW HCW:[id number] DOB: 09/28/1970 DOA: 06/26/2018  PCP: Patient, No Pcp Per  Patient coming from: Home    Chief Complaint: Leg pain  HPI: John Cox is a 47 y.o. male with medical history significant anxiety bipolar depression type 2 diabetes polysubstance abuse admitted with bilateral leg cramps below the knee started 2 hours prior to admission to the hospital.  Friend called EMS was found to have systolic blood pressure 72 in the field.  He used cocaine all night and smoked marijuana and drank Alcohol.  History is obtained from the ER records patient not able to hold a conversation or give me history.  But he is oriented to where he is he knows he is in the hospital.  ER documentation reports he has denied chest pain abdominal pain or headaches. ED Course: He received 3 L of IV fluids in the ER, blood pressure 116/75 pulse 73 respiration 20 saturation 98%.  Review of Systems: See HPI  Past Medical History:  Diagnosis Date  . AKI (acute kidney injury) ([place]) 01/25/2018  . Anxiety   . Bipolar disorder ([place])   . Depression 2000  . Diabetes mellitus 1995  . Hyperglycemia 01/25/2018  . Nausea & vomiting   . Polysubstance abuse ([place]) 2012   relapse 2 wk ago   . Suicidal ideation     Past Surgical History:  Procedure Laterality Date  . NO PAST SURGERIES       reports that he has been smoking cigarettes. He has a 25.00 pack-year smoking history. He has never used smokeless tobacco. He reports that he drinks about 1.0 standard drinks of alcohol per week. He reports that he has current or past drug history. Drugs: Marijuana and Cocaine.  No Known Allergies  Family History  Problem Relation Age of Onset  . Diabetes Mother   . COPD Mother   . Heart disease Mother     Prior to Admission medications   Medication Sig Start Date End Date Taking? Authorizing Provider  blood glucose meter kit and supplies Dispense based on patient and  insurance preference. Use up to four times daily as directed. (FOR ICD-10 E10.9, E11.9). 04/02/18   Dessa Phi, DO  cyclobenzaprine (FLEXERIL) 5 MG tablet Take 1 tablet (5 mg total) by mouth 3 (three) times daily as needed for muscle spasms. 05/27/18 06/26/18  Donne Hazel, MD  FLUoxetine (PROZAC) 20 MG capsule Take 1 capsule (20 mg total) by mouth daily. For mood control 06/03/18   Money, Lowry Ram, FNP  gabapentin (NEURONTIN) 100 MG capsule Take 1 capsule (100 mg total) by mouth 3 (three) times daily. 06/03/18 07/03/18  Money, Lowry Ram, FNP  hydrOXYzine (ATARAX/VISTARIL) 25 MG tablet Take 1 tablet (25 mg total) by mouth 3 (three) times daily as needed for itching, anxiety or nausea. 06/03/18   Money, Lowry Ram, FNP  insulin aspart (NOVOLOG) 100 UNIT/ML injection Inject 0-9 Units into the skin 3 (three) times daily with meals. 04/02/18   Dessa Phi, DO  insulin aspart protamine- aspart (NOVOLOG MIX 70/30) (70-30) 100 UNIT/ML injection Inject 0.28 mLs (28 Units total) into the skin daily with supper. 06/03/18   Money, Lowry Ram, FNP  insulin aspart protamine- aspart (NOVOLOG MIX 70/30) (70-30) 100 UNIT/ML injection Inject 0.22 mLs (22 Units total) into the skin daily with breakfast. 06/04/18   Money, Lowry Ram, FNP  traZODone (DESYREL) 100 MG tablet Take 1 tablet (100 mg total) by mouth at bedtime as needed  for sleep. 06/03/18   Money, Lowry Ram, FNP    Physical Exam: Vitals:   06/26/18 0745 06/26/18 0800 06/26/18 0815 06/26/18 1051  BP: 115/74 111/69 102/63 116/75  Pulse: 72   73  Resp: 17 13 15 20   SpO2: 96% (!) 88% 91% 98%  Weight:      Height:        Constitutional: NAD, calm, comfortable Vitals:   06/26/18 0745 06/26/18 0800 06/26/18 0815 06/26/18 1051  BP: 115/74 111/69 102/63 116/75  Pulse: 72   73  Resp: 17 13 15 20   SpO2: 96% (!) 88% 91% 98%  Weight:      Height:       Eyes: PERRL, lids and conjunctivae normal ENMT: Mucous membranes are DRY Posterior pharynx clear of any exudate  or lesions.Normal dentition.  Neck: normal, supple, no masses, no thyromegaly Respiratory: clear to auscultation bilaterally, no wheezing, no crackles. Normal respiratory effort. No accessory muscle use.  Cardiovascular: Regular rate and rhythm, no murmurs / rubs / gallops. No extremity edema. 2+ pedal pulses. No carotid bruits.  Abdomen: no tenderness, no masses palpated. No hepatosplenomegaly. Bowel sounds positive.  Musculoskeletal: Warm to touch pulses present.  Skin: no rashes, lesions, ulcers. No induration Neurologic: Oriented to place the does not know his name when I asked him who is the president of this country he said it ain't safe Labs on Admission: I have personally reviewed following labs and imaging studies  CBC: Recent Labs  Lab 06/26/18 0711  WBC 4.7  NEUTROABS 2.9  HGB 14.6  HCT 45.6  MCV 85.7  PLT 256   Basic Metabolic Panel: Recent Labs  Lab 06/26/18 0711  NA 136  K 3.8  CL 97*  CO2 29  GLUCOSE 178*  BUN 15  CREATININE 1.63*  CALCIUM 9.1   GFR: Estimated Creatinine Clearance: 54.8 mL/min (A) (by C-G formula based on SCr of 1.63 mg/dL (H)). Liver Function Tests: Recent Labs  Lab 06/26/18 0711  AST 28  ALT 30  ALKPHOS 58  BILITOT 1.3*  PROT 6.7  ALBUMIN 4.1   No results for input(s): LIPASE, AMYLASE in the last 168 hours. No results for input(s): AMMONIA in the last 168 hours. Coagulation Profile: No results for input(s): INR, PROTIME in the last 168 hours. Cardiac Enzymes: Recent Labs  Lab 06/26/18 0711  CKTOTAL 272   BNP (last 3 results) No results for input(s): PROBNP in the last 8760 hours. HbA1C: No results for input(s): HGBA1C in the last 72 hours. CBG: No results for input(s): GLUCAP in the last 168 hours. Lipid Profile: No results for input(s): CHOL, HDL, LDLCALC, TRIG, CHOLHDL, LDLDIRECT in the last 72 hours. Thyroid Function Tests: No results for input(s): TSH, T4TOTAL, FREET4, T3FREE, THYROIDAB in the last 72  hours. Anemia Panel: No results for input(s): VITAMINB12, FOLATE, FERRITIN, TIBC, IRON, RETICCTPCT in the last 72 hours. Urine analysis:    Component Value Date/Time   COLORURINE YELLOW 05/24/2018 1939   APPEARANCEUR CLEAR 05/24/2018 1939   LABSPEC 1.031 (H) 05/24/2018 1939   PHURINE 5.0 05/24/2018 1939   GLUCOSEU 150 (A) 05/24/2018 1939   HGBUR NEGATIVE 05/24/2018 1939   HGBUR negative 05/12/2007 [place] 05/24/2018 1939   BILIRUBINUR negative 05/23/2014 1052   KETONESUR 5 (A) 05/24/2018 1939   PROTEINUR 100 (A) 05/24/2018 1939   UROBILINOGEN 0.2 10/04/2014 1749   NITRITE NEGATIVE 05/24/2018 1939   LEUKOCYTESUR NEGATIVE 05/24/2018 1939    Radiological Exams on Admission: No results found.  EKG: Independently reviewed.  Assessment/Plan Active Problems:   Overdose #1 polysubstance abuse status post THC, cocaine, alcohol abuse-history given to me was patient used all the 3 at last night and was brought to the hospital with hypotension and lethargy and not able to follow any commands.  When he first came in his systolic blood pressure was low he was given 3 L of IV fluid bolus and blood pressure came up to 282 systolic.  He was also found to have lower extremity faint pulses to no pulses when he first came in but the foot was warm to touch after hydration his pulses came back.  He probably had lower extremity cocaine induced vasospasm.  A urine drug screen was added today.  Previous urine drug screen shows positive cocaine and THC.  #2 AKI secondary to polysubstance abuse, hypotension, dehydration.  Hydrate recheck renal functions tomorrow.  Check CPK tomorrow continue IV fluids.  His initial CPKs in the 200S  #3 alcohol abuse unknown when was his last drink however alcohol level is less than 10 I will place him on CIWA protocol.  #4 history of type 2 diabetes with elevated hemoglobin A1c of 10.0 on insulin at home I will place him on SSI for now. DVT  prophylaxis:LOVENOX Code Status: FULL Family Communication: NONE Disposition Plan:TBD Consults called: NONE Admission status:OBSERVATION Georgette Shell MD [facility]  If 7PM-7AM, please contact night-coverage [email or web address] Password [email or web address]  06/26/2018, 11:41 AM

## 2018-06-26 NOTE — Progress Notes (Signed)
Pt still refusing to be admitted to hospital.

## 2018-06-27 DIAGNOSIS — I9589 Other hypotension: Secondary | ICD-10-CM

## 2018-06-27 LAB — COMPREHENSIVE METABOLIC PANEL
ALBUMIN: 2.7 g/dL — AB (ref 3.5–5.0)
ALT: 22 U/L (ref 0–44)
ANION GAP: 7 (ref 5–15)
AST: 24 U/L (ref 15–41)
Alkaline Phosphatase: 40 U/L (ref 38–126)
BILIRUBIN TOTAL: 1.2 mg/dL (ref 0.3–1.2)
BUN: 8 mg/dL (ref 6–20)
CO2: 27 mmol/L (ref 22–32)
Calcium: 8 mg/dL — ABNORMAL LOW (ref 8.9–10.3)
Chloride: 103 mmol/L (ref 98–111)
Creatinine, Ser: 1.1 mg/dL (ref 0.61–1.24)
GFR calc Af Amer: >60 mL/min (ref >60)
GFR calc non Af Amer: >60 mL/min (ref >60)
GLUCOSE: 335 mg/dL — AB (ref 70–99)
POTASSIUM: 5.7 mmol/L — AB (ref 3.5–5.1)
Sodium: 137 mmol/L (ref 135–145)
TOTAL PROTEIN: 4.6 g/dL — AB (ref 6.5–8.1)

## 2018-06-27 LAB — BASIC METABOLIC PANEL
ANION GAP: 9 (ref 5–15)
BUN: 12 mg/dL (ref 6–20)
CALCIUM: 7.9 mg/dL — AB (ref 8.9–10.3)
CO2: 24 mmol/L (ref 22–32)
Chloride: 100 mmol/L (ref 98–111)
Creatinine, Ser: 1.32 mg/dL — ABNORMAL HIGH (ref 0.61–1.24)
Glucose, Bld: 566 mg/dL (ref 70–99)
Potassium: 4.9 mmol/L (ref 3.5–5.1)
SODIUM: 133 mmol/L — AB (ref 135–145)

## 2018-06-27 LAB — CBC
HCT: 38.5 % — ABNORMAL LOW (ref 39.0–52.0)
HEMOGLOBIN: 12 g/dL — AB (ref 13.0–17.0)
MCH: 27.4 pg (ref 26.0–34.0)
MCHC: 31.2 g/dL (ref 30.0–36.0)
MCV: 87.9 fL (ref 80.0–100.0)
Platelets: 262 10*3/uL (ref 150–400)
RBC: 4.38 MIL/uL (ref 4.22–5.81)
RDW: 13.7 % (ref 11.5–15.5)
WBC: 3.6 10*3/uL — AB (ref 4.0–10.5)
nRBC: 0 % (ref 0.0–0.2)

## 2018-06-27 LAB — GLUCOSE, CAPILLARY
GLUCOSE-CAPILLARY: 472 mg/dL — AB (ref 70–99)
Glucose-Capillary: 515 mg/dL (ref 70–99)
Glucose-Capillary: 462 mg/dL — ABNORMAL HIGH (ref 70–99)

## 2018-06-27 LAB — CK: CK TOTAL: 268 U/L (ref 49–397)

## 2018-06-27 MED ORDER — CYCLOBENZAPRINE HCL 5 MG PO TABS: 5.0000 mg | ORAL_TABLET | Freq: Three times a day (TID) | ORAL | 0 refills | Status: AC | PRN

## 2018-06-27 MED ORDER — INSULIN ASPART PROT & ASPART (70-30 MIX) 100 UNIT/ML ~~LOC~~ SUSP
20.0000 [IU] | Freq: Two times a day (BID) | SUBCUTANEOUS | Status: DC
  Administered 2018-06-27: 20 [IU] via SUBCUTANEOUS
  Filled 2018-06-27: qty 10

## 2018-06-27 NOTE — Progress Notes (Signed)
John Cox to be D/C'd Home per MD order.  Discussed prescriptions and follow up appointments with the patient. Prescriptions given to patient, medication list explained in detail. Pt verbalized understanding.  Allergies as of 06/27/2018   No Known Allergies     Medication List    TAKE these medications   blood glucose meter kit and supplies Dispense based on patient and insurance preference. Use up to four times daily as directed. (FOR ICD-10 E10.9, E11.9).   cyclobenzaprine 5 MG tablet Commonly known as:  FLEXERIL Take 1 tablet (5 mg total) by mouth 3 (three) times daily as needed for muscle spasms.   FLUoxetine 20 MG capsule Commonly known as:  PROZAC Take 1 capsule (20 mg total) by mouth daily. For mood control   gabapentin 100 MG capsule Commonly known as:  NEURONTIN Take 1 capsule (100 mg total) by mouth 3 (three) times daily.   hydrOXYzine 25 MG tablet Commonly known as:  ATARAX/VISTARIL Take 1 tablet (25 mg total) by mouth 3 (three) times daily as needed for itching, anxiety or nausea.   insulin aspart 100 UNIT/ML injection Commonly known as:  novoLOG Inject 0-9 Units into the skin 3 (three) times daily with meals.   insulin aspart protamine- aspart (70-30) 100 UNIT/ML injection Commonly known as:  NOVOLOG MIX 70/30 Inject 0.28 mLs (28 Units total) into the skin daily with supper.   insulin aspart protamine- aspart (70-30) 100 UNIT/ML injection Commonly known as:  NOVOLOG MIX 70/30 Inject 0.22 mLs (22 Units total) into the skin daily with breakfast.   traZODone 100 MG tablet Commonly known as:  DESYREL Take 1 tablet (100 mg total) by mouth at bedtime as needed for sleep.       Vitals:   06/27/18 0545 06/27/18 1145  BP: 123/72 120/69  Pulse: 70 72  Resp: 16 20  Temp: 98.4 F (36.9 C) 97.9 F (36.6 C)  SpO2: 100% 100%    Skin clean, dry and intact without evidence of skin break down, no evidence of skin tears noted. IV catheter discontinued intact.  Site without signs and symptoms of complications. Dressing and pressure applied. Pt denies pain at this time. No complaints noted.  An After Visit Summary was printed and given to the patient. Patient escorted via [place], and D/C home via bus.  Haywood Lasso BSN, RN [employer] Phone [phone]

## 2018-06-27 NOTE — Progress Notes (Signed)
Provided patient with one bus pass. Left with RN, Lurena Joiner.  Enid Cutter, MSW, LCSWA Clinical Social Work [phone]

## 2018-06-27 NOTE — Discharge Summary (Signed)
Physician Discharge Summary  TIRSO LAWS FWY:[id number] DOB: 1970/11/18 DOA: 06/26/2018  PCP: Patient, No Pcp Per  Admit date: 06/26/2018 Discharge date: 06/27/2018  Time spent: 20 minutes  Recommendations for Outpatient Follow-up:  1. Patient advised to quit doing drugs 2. Patient advised to use insulin-get follow up labs as OP  Discharge Diagnoses:  Active Problems:   Overdose   Discharge Condition: improved  Diet recommendation: dm diet  Filed Weights   06/26/18 0657  Weight: 69.9 kg    History of present illness:   47 y.o. male with medical history significant anxiety bipolar depression type 2 diabetes polysubstance abuse admitted with bilateral leg cramps below the knee started 2 hours prior to admission to the hospital.  Friend called EMS was found to have systolic blood pressure 72 in the field.  He used cocaine all night and smoked marijuana and drank Alcohol  Hospital Course:  Patient was admitted -rapidly improved in terms of hypotension and His AKI resolved with volume repletion His toxic encephalopathy on admit was secondary to drugs and aki He didn't;t particularly wish to stay in hopsital for further checks on his labs, was drinking water well and passing good amounts of urine and it was felt patient was stabilized adequately for d/c   Discharge Exam: Vitals:   06/27/18 0545 06/27/18 1145  BP: 123/72 120/69  Pulse: 70 72  Resp: 16 20  Temp: 98.4 F (36.9 C) 97.9 F (36.6 C)  SpO2: 100% 100%    General: awake alert pleasant in nad Cardiovascular:  s1 s2 no m/r/g Respiratory: clear no added sound  Discharge Instructions   Discharge Instructions    Diet - low sodium heart healthy   Complete by:  As directed    Discharge instructions   Complete by:  As directed    Take your insulin as scheduled Please drink plenty of water Please follow with your regular doc in 1 week and get labs   Increase activity slowly   Complete by:  As directed       Allergies as of 06/27/2018   No Known Allergies     Medication List    TAKE these medications   blood glucose meter kit and supplies Dispense based on patient and insurance preference. Use up to four times daily as directed. (FOR ICD-10 E10.9, E11.9).   cyclobenzaprine 5 MG tablet Commonly known as:  FLEXERIL Take 1 tablet (5 mg total) by mouth 3 (three) times daily as needed for muscle spasms.   FLUoxetine 20 MG capsule Commonly known as:  PROZAC Take 1 capsule (20 mg total) by mouth daily. For mood control   gabapentin 100 MG capsule Commonly known as:  NEURONTIN Take 1 capsule (100 mg total) by mouth 3 (three) times daily.   hydrOXYzine 25 MG tablet Commonly known as:  ATARAX/VISTARIL Take 1 tablet (25 mg total) by mouth 3 (three) times daily as needed for itching, anxiety or nausea.   insulin aspart 100 UNIT/ML injection Commonly known as:  novoLOG Inject 0-9 Units into the skin 3 (three) times daily with meals.   insulin aspart protamine- aspart (70-30) 100 UNIT/ML injection Commonly known as:  NOVOLOG MIX 70/30 Inject 0.28 mLs (28 Units total) into the skin daily with supper.   insulin aspart protamine- aspart (70-30) 100 UNIT/ML injection Commonly known as:  NOVOLOG MIX 70/30 Inject 0.22 mLs (22 Units total) into the skin daily with breakfast.   traZODone 100 MG tablet Commonly known as:  DESYREL Take 1 tablet (  100 mg total) by mouth at bedtime as needed for sleep.      No Known Allergies    The results of significant diagnostics from this hospitalization (including imaging, microbiology, ancillary and laboratory) are listed below for reference.    Significant Diagnostic Studies: No results found.  Microbiology: No results found for this or any previous visit (from the past 240 hour(s)).   Labs: Basic Metabolic Panel: Recent Labs  Lab 06/26/18 0711 06/27/18 0547 06/27/18 1002  NA 136 137 133*  K 3.8 5.7* 4.9  CL 97* 103 100  CO2 _0 GLUCOSE 178* 335* 566*  BUN _1 CREATININE 1.63* 1.10 1.32*  CALCIUM 9.1 8.0* 7.9*   Liver Function Tests: Recent Labs  Lab 06/26/18 0711 06/27/18 0547  AST 28 24  ALT 30 22  ALKPHOS 58 40  BILITOT 1.3* 1.2  PROT 6.7 4.6*  ALBUMIN 4.1 2.7*   No results for input(s): LIPASE, AMYLASE in the last 168 hours. No results for input(s): AMMONIA in the last 168 hours. CBC: Recent Labs  Lab 06/26/18 0711 06/27/18 0547  WBC 4.7 3.6*  NEUTROABS 2.9  --   HGB 14.6 12.0*  HCT 45.6 38.5*  MCV 85.7 87.9  PLT 326 262   Cardiac Enzymes: Recent Labs  Lab 06/26/18 0711 06/27/18 0547  CKTOTAL 272 268   BNP: BNP (last 3 results) No results for input(s): BNP in the last 8760 hours.  ProBNP (last 3 results) No results for input(s): PROBNP in the last 8760 hours.  CBG: Recent Labs  Lab 06/26/18 1944 06/26/18 2043 06/26/18 2136 06/27/18 0813 06/27/18 1119  GLUCAP 446* 362* 255* 472* 515*       Signed:  Nita Sells MD   [facility] 06/27/2018, 12:13 PM

## 2018-11-15 ENCOUNTER — Other Ambulatory Visit: Payer: Self-pay

## 2018-11-15 ENCOUNTER — Emergency Department (HOSPITAL_COMMUNITY): Payer: Self-pay

## 2018-11-15 ENCOUNTER — Emergency Department (HOSPITAL_COMMUNITY)
Admission: EM | Admit: 2018-11-15 | Discharge: 2018-11-15 | Disposition: A | Discharge status: Home / Self Care | Payer: Self-pay | Attending: Emergency Medicine | Admitting: Emergency Medicine

## 2018-11-15 ENCOUNTER — Encounter (HOSPITAL_COMMUNITY): Payer: Self-pay | Admitting: Emergency Medicine

## 2018-11-15 DIAGNOSIS — Z794 Long term (current) use of insulin: Secondary | ICD-10-CM | POA: Insufficient documentation

## 2018-11-15 DIAGNOSIS — F17210 Nicotine dependence, cigarettes, uncomplicated: Secondary | ICD-10-CM | POA: Insufficient documentation

## 2018-11-15 DIAGNOSIS — R739 Hyperglycemia, unspecified: Secondary | ICD-10-CM

## 2018-11-15 DIAGNOSIS — E1165 Type 2 diabetes mellitus with hyperglycemia: Secondary | ICD-10-CM | POA: Insufficient documentation

## 2018-11-15 LAB — URINALYSIS, ROUTINE W REFLEX MICROSCOPIC
BACTERIA UA: NONE SEEN
Bilirubin Urine: NEGATIVE
Glucose, UA: >=500 mg/dL — AB
Hgb urine dipstick: NEGATIVE
Ketones, ur: NEGATIVE mg/dL
LEUKOCYTE UA: NEGATIVE
Nitrite: NEGATIVE
PH: 7 (ref 5.0–8.0)
Protein, ur: NEGATIVE mg/dL
SPECIFIC GRAVITY, URINE: 1.023 (ref 1.005–1.030)

## 2018-11-15 LAB — LIPASE, BLOOD: LIPASE: 50 U/L (ref 11–51)

## 2018-11-15 LAB — CBC
HEMATOCRIT: 42.7 % (ref 39.0–52.0)
Hemoglobin: 13.2 g/dL (ref 13.0–17.0)
MCH: 27 pg (ref 26.0–34.0)
MCHC: 30.9 g/dL (ref 30.0–36.0)
MCV: 87.5 fL (ref 80.0–100.0)
NRBC: 0 % (ref 0.0–0.2)
Platelets: 336 10*3/uL (ref 150–400)
RBC: 4.88 MIL/uL (ref 4.22–5.81)
RDW: 12.9 % (ref 11.5–15.5)
WBC: 4 10*3/uL (ref 4.0–10.5)

## 2018-11-15 LAB — COMPREHENSIVE METABOLIC PANEL
ALBUMIN: 2.8 g/dL — AB (ref 3.5–5.0)
ALT: 30 U/L (ref 0–44)
AST: 34 U/L (ref 15–41)
Alkaline Phosphatase: 63 U/L (ref 38–126)
Anion gap: 8 (ref 5–15)
BUN: 24 mg/dL — ABNORMAL HIGH (ref 6–20)
CHLORIDE: 99 mmol/L (ref 98–111)
CO2: 26 mmol/L (ref 22–32)
Calcium: 8.1 mg/dL — ABNORMAL LOW (ref 8.9–10.3)
Creatinine, Ser: 1.5 mg/dL — ABNORMAL HIGH (ref 0.61–1.24)
GFR calc non Af Amer: 54 mL/min — ABNORMAL LOW (ref >60)
GLUCOSE: 620 mg/dL — AB (ref 70–99)
Potassium: 5 mmol/L (ref 3.5–5.1)
SODIUM: 133 mmol/L — AB (ref 135–145)
Total Bilirubin: 0.7 mg/dL (ref 0.3–1.2)
Total Protein: 4.9 g/dL — ABNORMAL LOW (ref 6.5–8.1)

## 2018-11-15 LAB — CBG MONITORING, ED
GLUCOSE-CAPILLARY: 542 mg/dL — AB (ref 70–99)
Glucose-Capillary: >600 mg/dL (ref 70–99)

## 2018-11-15 MED ORDER — SODIUM CHLORIDE 0.9% FLUSH: 3.0000 mL | Freq: Once | INTRAVENOUS | Status: DC

## 2018-11-15 MED ORDER — INSULIN ASPART 100 UNIT/ML ~~LOC~~ SOLN
10.0000 [IU] | Freq: Once | SUBCUTANEOUS | Status: AC
  Administered 2018-11-15: 10 [IU] via SUBCUTANEOUS

## 2018-11-15 MED ORDER — LACTATED RINGERS IV BOLUS
1000.0000 mL | Freq: Once | INTRAVENOUS | Status: AC
  Administered 2018-11-15: 1000 mL via INTRAVENOUS

## 2018-11-15 NOTE — ED Provider Notes (Signed)
I saw and evaluated the patient, reviewed the resident's note and I agree with the findings and plan.  Pertinent History: Patient is a diabetic, insulin requiring, recently incarcerated and did not have his insulin while he was in jail, now he is released, feeling some shortness of breath, also complaining of his neuropathy which is chronic  Pertinent Exam findings: On exam the patient appears well, he has a minimal tenderness in the periumbilical region but no guarding, heart and lung exams are unremarkable, no edema, mucous membranes are normal-appearing as this is normal mental status.  I was personally present and directly supervised the following procedures:  Medical resuscitation for hyperglycemia, no signs of diabetic ketoacidosis based on lab work  No increased anion gap, no low bicarb, sugar is over 600 and will require insulin and fluids.  I personally interpreted the EKG as well as the resident and agree with the interpretation on the resident's chart.  Final diagnoses:  Hyperglycemia      Eber Hong, MD 11/16/18 [phone]

## 2018-11-15 NOTE — ED Provider Notes (Signed)
[facility] Provider Note   CSN: [id number] Arrival date & time: 11/15/18  1648    History   Chief Complaint Chief Complaint  Patient presents with   Shortness of Breath   Abdominal Pain    HPI John Cox is a 48 y.o. male with history of insulin-dependent diabetes who presents emergency department complaining of general malaise for the past 1 to 2 days.  Patient states he is felt increasingly fatigued/weak as well as short of breath since getting out of prison 1 day ago.  He states that he was incarcerated over the weekend and missed multiple doses of his insulin regimen secondary to the prison not providing it to him.  Patient states he also felt mildly nauseated earlier today however is still stooling and voiding without issue.      Illness  Severity:  Moderate Onset quality:  Gradual Duration:  2 days Timing:  Constant Progression:  Worsening Chronicity:  New Associated symptoms: fatigue, nausea and shortness of breath   Associated symptoms: no abdominal pain, no chest pain, no cough, no ear pain, no fever, no rash, no sore throat and no vomiting     Past Medical History:  Diagnosis Date   AKI (acute kidney injury) ([place]) 01/25/2018   Anxiety    Bipolar disorder ([place])    Depression 2000   Diabetes mellitus 1995   Hyperglycemia 01/25/2018   Nausea & vomiting    Polysubstance abuse ([place]) 2012   relapse 2 wk ago    Suicidal ideation     Patient Active Problem List   Diagnosis Date Noted   Overdose 06/26/2018   MDD (major depressive disorder), severe ([place]) 05/27/2018   Tachy-brady syndrome ([place]) 05/26/2018   Hypoglycemia 05/24/2018   DKA, type 1 ([place]) 03/31/2018   Hyperglycemia due to type 1 diabetes mellitus ([place]) 02/23/2018   Cocaine dependence with cocaine-induced mood disorder ([place])    MDD (major depressive disorder), recurrent severe, 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/2018   Malnutrition of  moderate degree 07/19/2015   AKI (acute kidney injury) ([place]) 07/18/2015   Marijuana abuse 07/18/2015   Dehydration 07/18/2015   Dental caries 07/18/2015   Hyperlipidemia associated with type 2 diabetes mellitus ([place]) 05/24/2014   Polysubstance abuse ([place]) 05/13/2014   Major depression, recurrent ([place]) 05/13/2014   Suicidal ideation 05/13/2014   ANTICARDIOLIPIN ANTIBODY SYNDROME 05/12/2007   Type 1 diabetes mellitus ([place]) 04/26/2007   TOBACCO ABUSE 04/26/2007   THROMBOSIS, 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 Authorizing Provider  insulin aspart (NOVOLOG) 100 UNIT/ML injection Inject 0-9 Units into the skin 3 (three) times daily with meals. Patient taking differently: Inject 0-12 Units into the skin 3 (three) times daily with meals.  04/02/18  Yes Dessa Phi, DO  insulin aspart protamine- aspart (NOVOLOG MIX 70/30) (70-30) 100 UNIT/ML injection Inject 0.28 mLs (28 Units total) into the skin daily with supper. Patient taking differently: Inject 25-30 Units into the skin daily with supper.  06/03/18  Yes Money, Lowry Ram, FNP  blood glucose meter kit and supplies Dispense based on patient and insurance preference. Use up to four times daily as directed. (FOR ICD-10 E10.9, E11.9). 04/02/18   Dessa Phi, DO  FLUoxetine (PROZAC) 20 MG capsule Take 1 capsule (20 mg total) by mouth  daily. For mood control Patient not taking: Reported on 11/15/2018 06/03/18   Money, Lowry Ram, FNP  gabapentin (NEURONTIN) 100 MG capsule Take 1 capsule (100 mg total) by mouth 3 (three) times daily. Patient not taking: Reported on 11/15/2018 06/03/18 07/03/18  Money, Lowry Ram, FNP  hydrOXYzine (ATARAX/VISTARIL) 25 MG tablet Take 1 tablet (25 mg total) by mouth 3 (three) times daily as needed for itching, anxiety or nausea. Patient not  taking: Reported on 11/15/2018 06/03/18   Money, Lowry Ram, FNP  traZODone (DESYREL) 100 MG tablet Take 1 tablet (100 mg total) by mouth at bedtime as needed for sleep. Patient not taking: Reported on 11/15/2018 06/03/18   Money, Lowry Ram, FNP    Family History Family History  Problem Relation Age of Onset   Diabetes Mother    COPD Mother    Heart disease Mother     Social History Social History   Tobacco Use   Smoking status: Current Every Day Smoker    Packs/day: 1.00    Years: 25.00    Pack years: 25.00    Types: Cigarettes   Smokeless tobacco: Never Used  Substance Use Topics   Alcohol use: Yes    Alcohol/week: 1.0 standard drinks    Types: 1 Cans of beer per week    Comment: one beer weekly   Drug use: Yes    Types: Marijuana, Cocaine    Comment: THC daily, Cocaine daily     Allergies   Patient has no known allergies.   Review of Systems Review of Systems  Constitutional: Positive for fatigue. Negative for chills and fever.  HENT: Negative for ear pain and sore throat.   Eyes: Negative for pain and visual disturbance.  Respiratory: Positive for shortness of breath. Negative for cough.   Cardiovascular: Negative for chest pain and palpitations.  Gastrointestinal: Positive for nausea. Negative for abdominal pain and vomiting.  Genitourinary: Negative for dysuria and hematuria.  Musculoskeletal: Negative for arthralgias and back pain.  Skin: Negative for color change and rash.  Neurological: Positive for weakness. Negative for seizures and syncope.  All other systems reviewed and are negative.    Physical Exam Updated Vital Signs BP (!) 153/89 (BP Location: Left Arm)    Pulse 86    Temp 97.9 F (36.6 C) (Oral)    Resp 18    SpO2 100%   Physical Exam Vitals signs and nursing note reviewed.  Constitutional:      Appearance: He is well-developed.  HENT:     Head: Normocephalic and atraumatic.  Eyes:     Conjunctiva/sclera: Conjunctivae normal.    Neck:     Musculoskeletal: Neck supple.  Cardiovascular:     Rate and Rhythm: Normal rate and regular rhythm.     Heart sounds: No murmur.  Pulmonary:     Effort: Pulmonary effort is normal. No respiratory distress.     Breath sounds: Normal breath sounds.  Abdominal:     Palpations: Abdomen is soft. There is no splenomegaly.     Tenderness: There is no abdominal tenderness.  Skin:    General: Skin is warm and dry.  Neurological:     General: No focal deficit present.     Mental Status: He is alert and oriented to person, place, and time.     Cranial Nerves: No cranial nerve deficit.     Sensory: No sensory deficit.      ED Treatments / Results  Labs (all labs ordered are listed, but only abnormal  results are displayed) Labs Reviewed  COMPREHENSIVE METABOLIC PANEL - Abnormal; Notable for the following components:      Result Value   Sodium 133 (*)    Glucose, Bld 620 (*)    BUN 24 (*)    Creatinine, Ser 1.50 (*)    Calcium 8.1 (*)    Total Protein 4.9 (*)    Albumin 2.8 (*)    GFR calc non Af Amer 54 (*)    All other components within normal limits  URINALYSIS, ROUTINE W REFLEX MICROSCOPIC - Abnormal; Notable for the following components:   Color, Urine COLORLESS (*)    Glucose, UA >=500 (*)    All other components within normal limits  CBG MONITORING, ED - Abnormal; Notable for the following components:   Glucose-Capillary >600 (*)    All other components within normal limits  CBG MONITORING, ED - Abnormal; Notable for the following components:   Glucose-Capillary 542 (*)    All other components within normal limits  LIPASE, BLOOD  CBC    EKG EKG Interpretation  Date/Time:  Monday November 15 2018 16:55:19 EDT Ventricular Rate:  85 PR Interval:  148 QRS Duration: 98 QT Interval:  378 QTC Calculation: 449 R Axis:   85 Text Interpretation:  Normal sinus rhythm Minimal voltage criteria for LVH, may be normal variant Borderline ECG less ST elevation compared  with prior (diffuse ST elevation on prior) Confirmed by Noemi Chapel [phone]) on 11/15/2018 6:02:32 PM   Radiology Dg Chest 2 View  Result Date: 11/15/2018 CLINICAL DATA:  Shortness of breath started yesterday EXAM: CHEST - 2 VIEW COMPARISON:  05/24/2018 FINDINGS: The heart size and mediastinal contours are within normal limits. Both lungs are clear. The visualized skeletal structures are unremarkable. IMPRESSION: No active cardiopulmonary disease. Electronically Signed   By: Kathreen Devoid   On: 11/15/2018 18:11    Procedures Procedures (including critical care time)  Medications Ordered in ED Medications  sodium chloride flush (NS) 0.9 % injection 3 mL (has no administration in time range)  lactated ringers bolus 1,000 mL (0 mLs Intravenous Stopped 11/15/18 2025)  insulin aspart (novoLOG) injection 10 Units (10 Units Subcutaneous Given 11/15/18 1914)  lactated ringers bolus 1,000 mL (0 mLs Intravenous Stopped 11/15/18 2310)     Initial Impression / Assessment and Plan / ED Course  I have reviewed the triage vital signs and the nursing notes.  Pertinent labs & imaging results that were available during my care of the patient were reviewed by me and considered in my medical decision making (see chart for details).       Patient is a 48 year old male with insulin-dependent diabetes who presents the emergency department complaining of generalized fatigue/weakness as well as some shortness of breath over the past 1 to 2 days.  Patient was incarcerated over the weekend and missed multiple doses of his home insulin regimen.  He states that he took his 70/30 insulin earlier this morning however he feels as if his blood sugar is elevated.  On initial evaluation the patient he was hemodynamically stable and nontoxic-appearing.  The patient was afebrile, not tachycardic, normotensive, satting appropriately on room air.  Physical exam as detailed above which is remarkable for middle-age male resting  comfortably in bed.  Lungs are clear to auscultation and no murmurs rubs or gallops are present on auscultation of the heart.  Abdomen is soft and nontender.  Equal pulses throughout.  Prior to my evaluation the patient he had basic lab work obtained  via the triaging process that had already resulted.  These results reviewed by myself and used in medical decision-making.  Patient CBC within normal limits.  His metabolic panel shows hyperglycemia with blood glucose of 620 however bicarb and anion gap are within normal limits.  Creatinine is mildly elevated at 1.50 which is not significantly changed from prior.  This is most likely secondary to prerenal azotemia with volume deplete status secondary to hyperglycemia.  CXR showing no acute cardiac or pulmonary pathology.   Hyperglycemia likely secondary to poor insulin compliance with recent incarceration and unlikely to be due to acute infectious process.  As the patient was not in DKA and potassium was within normal limits, he was given 2L of IV crystalloids as well as 10 units of regular insulin.  Patient's blood glucose mildly improved following therapy now down to 542.  Patient wishes to return home at this time.  He states that he has his insulin at home and will be able to continue glucose monitoring and management.  As patient is not in DKA and shows no signs of HHS I feel he is stable for discharge at this time with strict instructions on appropriate and home glucose management as well as follow-up with PCP for optimization of outpatient regimen.  I discussed concerning signs and symptoms that would necessitate return to the emergency department.  Patient voiced understanding of these instructions and had no further questions at this time. Final Clinical Impressions(s) / ED Diagnoses   Final diagnoses:  Hyperglycemia    ED Discharge Orders    None       Tommie Raymond, MD 11/16/18 Sheila Oats    Noemi Chapel, MD 11/16/18 [phone]

## 2018-11-15 NOTE — ED Triage Notes (Signed)
Pt reports SOB that started yesterday. States he is a diabetic and is also having problems with neuropathy, reports taking his blood sugar, lang acting and fast acting insulin this morning

## 2018-11-15 NOTE — ED Notes (Signed)
Patient verbalizes understanding of discharge instructions. Opportunity for questioning and answers were provided. Armband removed by staff, pt discharged from ED, ambulatory to the lobby.  

## 2019-03-06 ENCOUNTER — Inpatient Hospital Stay (HOSPITAL_COMMUNITY)
Admission: EM | Admit: 2019-03-06 | Discharge: 2019-03-08 | DRG: 638 | Disposition: A | Discharge status: Home / Self Care | Payer: Self-pay | Attending: Internal Medicine | Admitting: Internal Medicine

## 2019-03-06 ENCOUNTER — Encounter (HOSPITAL_COMMUNITY): Payer: Self-pay

## 2019-03-06 ENCOUNTER — Other Ambulatory Visit: Payer: Self-pay

## 2019-03-06 DIAGNOSIS — N179 Acute kidney failure, unspecified: Secondary | ICD-10-CM | POA: Diagnosis present

## 2019-03-06 DIAGNOSIS — F332 Major depressive disorder, recurrent severe without psychotic features: Secondary | ICD-10-CM

## 2019-03-06 DIAGNOSIS — Z20828 Contact with and (suspected) exposure to other viral communicable diseases: Secondary | ICD-10-CM | POA: Diagnosis present

## 2019-03-06 DIAGNOSIS — F319 Bipolar disorder, unspecified: Secondary | ICD-10-CM | POA: Diagnosis present

## 2019-03-06 DIAGNOSIS — E875 Hyperkalemia: Secondary | ICD-10-CM | POA: Diagnosis present

## 2019-03-06 DIAGNOSIS — Z79899 Other long term (current) drug therapy: Secondary | ICD-10-CM

## 2019-03-06 DIAGNOSIS — E1022 Type 1 diabetes mellitus with diabetic chronic kidney disease: Secondary | ICD-10-CM | POA: Diagnosis present

## 2019-03-06 DIAGNOSIS — Z9119 Patient's noncompliance with other medical treatment and regimen: Secondary | ICD-10-CM

## 2019-03-06 DIAGNOSIS — R17 Unspecified jaundice: Secondary | ICD-10-CM | POA: Diagnosis present

## 2019-03-06 DIAGNOSIS — F419 Anxiety disorder, unspecified: Secondary | ICD-10-CM | POA: Diagnosis present

## 2019-03-06 DIAGNOSIS — Z9114 Patient's other noncompliance with medication regimen: Secondary | ICD-10-CM

## 2019-03-06 DIAGNOSIS — F17210 Nicotine dependence, cigarettes, uncomplicated: Secondary | ICD-10-CM | POA: Diagnosis present

## 2019-03-06 DIAGNOSIS — Z8249 Family history of ischemic heart disease and other diseases of the circulatory system: Secondary | ICD-10-CM

## 2019-03-06 DIAGNOSIS — N182 Chronic kidney disease, stage 2 (mild): Secondary | ICD-10-CM | POA: Diagnosis present

## 2019-03-06 DIAGNOSIS — E1310 Other specified diabetes mellitus with ketoacidosis without coma: Secondary | ICD-10-CM

## 2019-03-06 DIAGNOSIS — Z794 Long term (current) use of insulin: Secondary | ICD-10-CM

## 2019-03-06 DIAGNOSIS — Z825 Family history of asthma and other chronic lower respiratory diseases: Secondary | ICD-10-CM

## 2019-03-06 DIAGNOSIS — F339 Major depressive disorder, recurrent, unspecified: Secondary | ICD-10-CM | POA: Diagnosis present

## 2019-03-06 DIAGNOSIS — E1110 Type 2 diabetes mellitus with ketoacidosis without coma: Secondary | ICD-10-CM | POA: Diagnosis present

## 2019-03-06 DIAGNOSIS — E1010 Type 1 diabetes mellitus with ketoacidosis without coma: Principal | ICD-10-CM | POA: Diagnosis present

## 2019-03-06 DIAGNOSIS — Z833 Family history of diabetes mellitus: Secondary | ICD-10-CM

## 2019-03-06 LAB — URINALYSIS, ROUTINE W REFLEX MICROSCOPIC
Bacteria, UA: NONE SEEN
Bilirubin Urine: NEGATIVE
Glucose, UA: >=500 mg/dL — AB
Hgb urine dipstick: NEGATIVE
Ketones, ur: 80 mg/dL — AB
Leukocytes,Ua: NEGATIVE
Nitrite: NEGATIVE
Protein, ur: NEGATIVE mg/dL
Specific Gravity, Urine: 1.026 (ref 1.005–1.030)
pH: 5 (ref 5.0–8.0)

## 2019-03-06 LAB — HEPATIC FUNCTION PANEL
ALT: 48 U/L — ABNORMAL HIGH (ref 0–44)
AST: 31 U/L (ref 15–41)
Albumin: 3.5 g/dL (ref 3.5–5.0)
Alkaline Phosphatase: 80 U/L (ref 38–126)
Bilirubin, Direct: 0.2 mg/dL (ref 0.0–0.2)
Indirect Bilirubin: 2.4 mg/dL — ABNORMAL HIGH (ref 0.3–0.9)
Total Bilirubin: 2.6 mg/dL — ABNORMAL HIGH (ref 0.3–1.2)
Total Protein: 6.2 g/dL — ABNORMAL LOW (ref 6.5–8.1)

## 2019-03-06 LAB — POCT I-STAT EG7
Acid-base deficit: 11 mmol/L — ABNORMAL HIGH (ref 0.0–2.0)
Bicarbonate: 16.9 mmol/L — ABNORMAL LOW (ref 20.0–28.0)
Calcium, Ion: 1.11 mmol/L — ABNORMAL LOW (ref 1.15–1.40)
HCT: 50 % (ref 39.0–52.0)
Hemoglobin: 17 g/dL (ref 13.0–17.0)
O2 Saturation: 97 %
Potassium: 7.1 mmol/L (ref 3.5–5.1)
Sodium: 128 mmol/L — ABNORMAL LOW (ref 135–145)
TCO2: 18 mmol/L — ABNORMAL LOW (ref 22–32)
pCO2, Ven: 41.6 mmHg — ABNORMAL LOW (ref 44.0–60.0)
pH, Ven: 7.216 — ABNORMAL LOW (ref 7.250–7.430)
pO2, Ven: 113 mmHg — ABNORMAL HIGH (ref 32.0–45.0)

## 2019-03-06 LAB — BASIC METABOLIC PANEL
Anion gap: 24 — ABNORMAL HIGH (ref 5–15)
BUN: 26 mg/dL — ABNORMAL HIGH (ref 6–20)
CO2: 17 mmol/L — ABNORMAL LOW (ref 22–32)
Calcium: 9.1 mg/dL (ref 8.9–10.3)
Chloride: 91 mmol/L — ABNORMAL LOW (ref 98–111)
Creatinine, Ser: 2.28 mg/dL — ABNORMAL HIGH (ref 0.61–1.24)
GFR calc Af Amer: 38 mL/min — ABNORMAL LOW (ref >60)
GFR calc non Af Amer: 33 mL/min — ABNORMAL LOW (ref >60)
Glucose, Bld: 754 mg/dL (ref 70–99)
Potassium: 7.4 mmol/L (ref 3.5–5.1)
Sodium: 132 mmol/L — ABNORMAL LOW (ref 135–145)

## 2019-03-06 LAB — CBC WITH DIFFERENTIAL/PLATELET
Abs Immature Granulocytes: 0.15 10*3/uL — ABNORMAL HIGH (ref 0.00–0.07)
Basophils Absolute: 0.1 10*3/uL (ref 0.0–0.1)
Basophils Relative: 1 %
Eosinophils Absolute: 0 10*3/uL (ref 0.0–0.5)
Eosinophils Relative: 0 %
HCT: 47.6 % (ref 39.0–52.0)
Hemoglobin: 15.1 g/dL (ref 13.0–17.0)
Immature Granulocytes: 2 %
Lymphocytes Relative: 10 %
Lymphs Abs: 0.9 10*3/uL (ref 0.7–4.0)
MCH: 27.9 pg (ref 26.0–34.0)
MCHC: 31.7 g/dL (ref 30.0–36.0)
MCV: 87.8 fL (ref 80.0–100.0)
Monocytes Absolute: 0.3 10*3/uL (ref 0.1–1.0)
Monocytes Relative: 4 %
Neutro Abs: 7.8 10*3/uL — ABNORMAL HIGH (ref 1.7–7.7)
Neutrophils Relative %: 83 %
Platelets: 253 10*3/uL (ref 150–400)
RBC: 5.42 MIL/uL (ref 4.22–5.81)
RDW: 14.9 % (ref 11.5–15.5)
WBC: 9.2 10*3/uL (ref 4.0–10.5)
nRBC: 0 % (ref 0.0–0.2)

## 2019-03-06 LAB — SARS CORONAVIRUS 2 BY RT PCR (HOSPITAL ORDER, PERFORMED IN ~~LOC~~ HOSPITAL LAB): SARS Coronavirus 2: NEGATIVE

## 2019-03-06 LAB — LIPASE, BLOOD: Lipase: 21 U/L (ref 11–51)

## 2019-03-06 LAB — CBG MONITORING, ED
Glucose-Capillary: >600 mg/dL (ref 70–99)
Glucose-Capillary: >600 mg/dL (ref 70–99)
Glucose-Capillary: >600 mg/dL (ref 70–99)

## 2019-03-06 LAB — GLUCOSE, CAPILLARY
Glucose-Capillary: 435 mg/dL — ABNORMAL HIGH (ref 70–99)
Glucose-Capillary: 284 mg/dL — ABNORMAL HIGH (ref 70–99)

## 2019-03-06 MED ORDER — CALCIUM GLUCONATE 10 % IV SOLN
1.0000 g | Freq: Once | INTRAVENOUS | Status: AC
  Administered 2019-03-06: 1 g via INTRAVENOUS
  Filled 2019-03-06: qty 10

## 2019-03-06 MED ORDER — SODIUM CHLORIDE 0.9 % IV BOLUS
1000.0000 mL | Freq: Once | INTRAVENOUS | Status: AC
  Administered 2019-03-06: 21:00:00 1000 mL via INTRAVENOUS

## 2019-03-06 MED ORDER — SODIUM BICARBONATE 8.4 % IV SOLN
50.0000 meq | Freq: Once | INTRAVENOUS | Status: AC
  Administered 2019-03-06: 50 meq via INTRAVENOUS
  Filled 2019-03-06: qty 50

## 2019-03-06 MED ORDER — SODIUM CHLORIDE 0.9 % IV BOLUS
1000.0000 mL | Freq: Once | INTRAVENOUS | Status: AC
  Administered 2019-03-06: 1000 mL via INTRAVENOUS

## 2019-03-06 MED ORDER — INSULIN REGULAR(HUMAN) IN NACL 100-0.9 UT/100ML-% IV SOLN
INTRAVENOUS | Status: DC
  Administered 2019-03-06: 5.4 [IU]/h via INTRAVENOUS

## 2019-03-06 MED ORDER — INSULIN REGULAR(HUMAN) IN NACL 100-0.9 UT/100ML-% IV SOLN
INTRAVENOUS | Status: DC
  Filled 2019-03-06: qty 100

## 2019-03-06 MED ORDER — DEXTROSE-NACL 5-0.45 % IV SOLN
INTRAVENOUS | Status: DC
  Administered 2019-03-07: 02:00:00 via INTRAVENOUS

## 2019-03-06 MED ORDER — HEPARIN SODIUM (PORCINE) 5000 UNIT/ML IJ SOLN
5000.0000 [IU] | Freq: Three times a day (TID) | INTRAMUSCULAR | Status: DC
  Administered 2019-03-06 – 2019-03-08 (×5): 5000 [IU] via SUBCUTANEOUS
  Filled 2019-03-06 (×5): qty 1

## 2019-03-06 MED ORDER — ONDANSETRON HCL 4 MG/2ML IJ SOLN
4.0000 mg | Freq: Once | INTRAMUSCULAR | Status: AC
  Administered 2019-03-06: 4 mg via INTRAVENOUS
  Filled 2019-03-06: qty 2

## 2019-03-06 MED ORDER — SODIUM ZIRCONIUM CYCLOSILICATE 10 G PO PACK
10.0000 g | PACK | Freq: Every day | ORAL | Status: DC
  Administered 2019-03-06: 10 g via ORAL
  Filled 2019-03-06 (×2): qty 1

## 2019-03-06 MED ORDER — SODIUM CHLORIDE 0.9 % IV SOLN
INTRAVENOUS | Status: DC
  Administered 2019-03-06: 23:00:00 via INTRAVENOUS

## 2019-03-06 MED ORDER — FENTANYL CITRATE (PF) 100 MCG/2ML IJ SOLN
50.0000 ug | Freq: Once | INTRAMUSCULAR | Status: AC
  Administered 2019-03-06: 50 ug via INTRAVENOUS
  Filled 2019-03-06: qty 2

## 2019-03-06 NOTE — ED Notes (Signed)
CBG at bedside read HI.  Provider aware and labs sent

## 2019-03-06 NOTE — ED Triage Notes (Signed)
Pt c/o weakness for 3 days.  Pt started vomiting today..  Pt reports ABD pain for 3 days.  Pt states he does not check his CBG due to not having a meter at home

## 2019-03-06 NOTE — H&P (Signed)
History and Physical    John Cox JSH:[id number] DOB: September 07, 1970 DOA: 03/06/2019  PCP: Patient, No Pcp Per   Patient coming from: Home  Chief Complaint: Abdominal discomfort, N/V  HPI: John Cox is a 48 y.o. male with medical history significant for insulin-dependent diabetes mellitus, depression, history of polysubstance abuse, now presenting to the emergency department with 3 days of generalized weakness, malaise, vomiting, and abdominal discomfort.  Patient reports that he does not have a glucometer and has not checked his sugar recently.  Symptoms came on insidiously and have progressively worsened.  Abdominal discomfort is generalized and cramping in nature.  Denies any fevers, chills, shortness of breath, cough, or chest pain.  ED Course: Upon arrival to the ED, patient is found to be afebrile, saturating well on room air, and with stable blood pressure and normal heart rate.  EKG features a sinus rhythm with peaked T waves.  Chemistry panel is notable for glucose of 754 with bicarbonate 17, anion gap 24, potassium 7.4, and creatinine of 2.28, up from 1.5 in March.  Also noted on chemistry panel is mild elevation in ALT and total bilirubin of 2.6.  CBC is unremarkable.  Urinalysis notable for ketonuria.  Patient was treated with calcium, insulin, Lokelma, and 2 L of IV fluids in the ED.  Hospitalists are consulted for admission.   Review of Systems:  All other systems reviewed and apart from HPI, are negative.  Past Medical History:  Diagnosis Date  . AKI (acute kidney injury) ([place]) 01/25/2018  . Anxiety   . Bipolar disorder ([place])   . Depression 2000  . Diabetes mellitus 1995  . Hyperglycemia 01/25/2018  . Nausea & vomiting   . Polysubstance abuse ([place]) 2012   relapse 2 wk ago   . Suicidal ideation     Past Surgical History:  Procedure Laterality Date  . NO PAST SURGERIES       reports that he has been smoking cigarettes. He has a 25.00 pack-year smoking history.  He has never used smokeless tobacco. He reports current alcohol use of about 1.0 standard drinks of alcohol per week. He reports current drug use. Drugs: Marijuana and Cocaine.  No Known Allergies  Family History  Problem Relation Age of Onset  . Diabetes Mother   . COPD Mother   . Heart disease Mother      Prior to Admission medications   Medication Sig Start Date End Date Taking? Authorizing Provider  blood glucose meter kit and supplies Dispense based on patient and insurance preference. Use up to four times daily as directed. (FOR ICD-10 E10.9, E11.9). 04/02/18   Dessa Phi, DO  FLUoxetine (PROZAC) 20 MG capsule Take 1 capsule (20 mg total) by mouth daily. For mood control Patient not taking: Reported on 11/15/2018 06/03/18   Money, Lowry Ram, FNP  gabapentin (NEURONTIN) 100 MG capsule Take 1 capsule (100 mg total) by mouth 3 (three) times daily. Patient not taking: Reported on 11/15/2018 06/03/18 07/03/18  Money, Lowry Ram, FNP  hydrOXYzine (ATARAX/VISTARIL) 25 MG tablet Take 1 tablet (25 mg total) by mouth 3 (three) times daily as needed for itching, anxiety or nausea. Patient not taking: Reported on 11/15/2018 06/03/18   Money, Lowry Ram, FNP  insulin aspart (NOVOLOG) 100 UNIT/ML injection Inject 0-9 Units into the skin 3 (three) times daily with meals. Patient taking differently: Inject 0-12 Units into the skin 3 (three) times daily with meals.  04/02/18   Dessa Phi, DO  insulin aspart protamine- aspart (  NOVOLOG MIX 70/30) (70-30) 100 UNIT/ML injection Inject 0.28 mLs (28 Units total) into the skin daily with supper. Patient taking differently: Inject 25-30 Units into the skin daily with supper.  06/03/18   Money, Lowry Ram, FNP  traZODone (DESYREL) 100 MG tablet Take 1 tablet (100 mg total) by mouth at bedtime as needed for sleep. Patient not taking: Reported on 11/15/2018 06/03/18   MoneyLowry Ram, FNP    Physical Exam: Vitals:   03/06/19 1800 03/06/19 1815 03/06/19 1830 03/06/19  1845  BP: 107/60 101/64 122/67 105/63  Pulse: 80 80    Resp: (!) 21 (!) 22 (!) 21 20  Temp:      TempSrc:      SpO2: 98% 99%    Weight:      Height:        Constitutional: NAD, calm  Eyes: PERTLA, lids and conjunctivae normal ENMT: Mucous membranes are moist. Posterior pharynx clear of any exudate or lesions.   Neck: normal, supple, no masses, no thyromegaly Respiratory:  no wheezing, no crackles. No accessory muscle use.  Cardiovascular: S1 & S2 heard, regular rate and rhythm. No extremity edema.   Abdomen: No distension, soft, mild generalized tenderness, no rebound pain or guarding. Bowel sounds active.  Musculoskeletal: no clubbing / cyanosis. No joint deformity upper and lower extremities.  Skin: no significant rashes, lesions, ulcers. Warm, dry, well-perfused. Neurologic: CN 2-12 grossly intact. Sensation intact. Moving all extremities.  Psychiatric: Somnolent, easily woken. Oriented to person, place, and situation.    Labs on Admission: I have personally reviewed following labs and imaging studies  CBC: Recent Labs  Lab 03/06/19 1734 03/06/19 1904  WBC 9.2  --   NEUTROABS 7.8*  --   HGB 15.1 17.0  HCT 47.6 50.0  MCV 87.8  --   PLT 253  --    Basic Metabolic Panel: Recent Labs  Lab 03/06/19 1734 03/06/19 1904  NA 132* 128*  K 7.4* 7.1*  CL 91*  --   CO2 17*  --   GLUCOSE 754*  --   BUN 26*  --   CREATININE 2.28*  --   CALCIUM 9.1  --    GFR: Estimated Creatinine Clearance: 38.6 mL/min (A) (by C-G formula based on SCr of 2.28 mg/dL (H)). Liver Function Tests: Recent Labs  Lab 03/06/19 1734  AST 31  ALT 48*  ALKPHOS 80  BILITOT 2.6*  PROT 6.2*  ALBUMIN 3.5   Recent Labs  Lab 03/06/19 1734  LIPASE 21   No results for input(s): AMMONIA in the last 168 hours. Coagulation Profile: No results for input(s): INR, PROTIME in the last 168 hours. Cardiac Enzymes: No results for input(s): CKTOTAL, CKMB, CKMBINDEX, TROPONINI in the last 168 hours.  BNP (last 3 results) No results for input(s): PROBNP in the last 8760 hours. HbA1C: No results for input(s): HGBA1C in the last 72 hours. CBG: Recent Labs  Lab 03/06/19 1730  GLUCAP >600*   Lipid Profile: No results for input(s): CHOL, HDL, LDLCALC, TRIG, CHOLHDL, LDLDIRECT in the last 72 hours. Thyroid Function Tests: No results for input(s): TSH, T4TOTAL, FREET4, T3FREE, THYROIDAB in the last 72 hours. Anemia Panel: No results for input(s): VITAMINB12, FOLATE, FERRITIN, TIBC, IRON, RETICCTPCT in the last 72 hours. Urine analysis:    Component Value Date/Time   COLORURINE STRAW (A) 03/06/2019 1739   APPEARANCEUR CLEAR 03/06/2019 1739   LABSPEC 1.026 03/06/2019 1739   PHURINE 5.0 03/06/2019 1739   GLUCOSEU >=500 (A) 03/06/2019 1739  HGBUR NEGATIVE 03/06/2019 1739   HGBUR negative 05/12/2007 [place] 03/06/2019 1739   BILIRUBINUR negative 05/23/2014 1052   KETONESUR 80 (A) 03/06/2019 1739   PROTEINUR NEGATIVE 03/06/2019 1739   UROBILINOGEN 0.2 10/04/2014 1749   NITRITE NEGATIVE 03/06/2019 1739   LEUKOCYTESUR NEGATIVE 03/06/2019 1739   Sepsis Labs: @LABRCNTIP (procalcitonin:4,lacticidven:4) )No results found for this or any previous visit (from the past 240 hour(s)).   Radiological Exams on Admission: No results found.  EKG: Independently reviewed. Sinus rhythm, peaked T-waves, QRS 97, QTc 467.   Assessment/Plan   1. 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 DKA  - Presents with abdominal discomfort and N/V, found to have serum glucose of 754 with acidosis on blood gas, elevated AG, and urine ketones  - A1c was 10.9% in September 2019  - He does not have a glucometer at home and this is likely related to poor adherence to treatment plan rather than a serious acute illness  - He was given 2 liters IVF in ED and started on insulin infusion  - Continue insulin infusion and IVF with frequent CBG's and serial chem panels until DKA resolves and patient tolerating diet    3. Hyperbilirubinemia  - Total bilirubin is 2.6 (unconjugated 2.4) with slight ALT elevation, normal in March   - There is generalized abdominal discomfort but he is not particularly tender in RUQ, no evidence for hemolysis, repeat LFT's in am   4. Depression  - He is prescribed several medications but never picked them up - Untreated mental illness likely contributing to his poor glycemic-control and DKA and SW will be consulted     PPE: Mask, face shield. Patient wearing mask.  DVT prophylaxis: sq heparin  Code Status: Full Family Communication: Discussed with patient Consults called: None  Admission status: Inpatient. Patient is critically-ill with acute renal failure, life-threatening hyperkalemia, and DKA. It will require inpatient management to stabilize the condition and avoid life-threatening complications.     Vianne Bulls, MD [facility] Pager [phone]  If 7PM-7AM, please contact night-coverage [email or web address] Password [facility]  03/06/2019, 7:41 PM

## 2019-03-06 NOTE — ED Provider Notes (Signed)
[facility] Provider Note   CSN: [id number] Arrival date & time: 03/06/19  1646    History   Chief Complaint Chief Complaint  Patient presents with  . Hyperglycemia    HPI John Cox is a 48 y.o. male.     The history is provided by the patient.  Hyperglycemia Severity:  Moderate Onset quality:  Unable to specify Timing:  Constant Progression:  Worsening Chronicity:  New Diabetes status:  Non-diabetic Current diabetic therapy:  Insulin  Time since last antidiabetic medication:  1 day Relieved by:  Nothing Ineffective treatments:  None tried Associated symptoms: abdominal pain (cramps), dehydration, nausea and vomiting   Associated symptoms: no chest pain, no dysuria, no fever and no shortness of breath     Past Medical History:  Diagnosis Date  . AKI (acute kidney injury) ([place]) 01/25/2018  . Anxiety   . Bipolar disorder ([place])   . Depression 2000  . Diabetes mellitus 1995  . Hyperglycemia 01/25/2018  . Nausea & vomiting   . Polysubstance abuse ([place]) 2012   relapse 2 wk ago   . Suicidal ideation     Patient Active Problem List   Diagnosis Date Noted  . Hyperkalemia 03/06/2019  . Hyperbilirubinemia 03/06/2019  . Overdose 06/26/2018  . MDD (major depressive disorder), severe ([place]) 05/27/2018  . Tachy-brady syndrome ([place]) 05/26/2018  . Hypoglycemia 05/24/2018  . DKA, type 1 ([place]) 03/31/2018  . Hyperglycemia due to type 1 diabetes mellitus ([place]) 02/23/2018  . Cocaine dependence with cocaine-induced mood disorder ([place])   . MDD (major depressive disorder), recurrent severe, without psychosis ([place]) 02/22/2018  . Malnutrition of moderate degree 07/19/2015  . DKA (diabetic ketoacidoses) ([place]) 07/18/2015  . AKI (acute kidney injury) ([place]) 07/18/2015  . Marijuana abuse 07/18/2015  . Dehydration 07/18/2015  . Dental caries 07/18/2015  . Hyperlipidemia associated with type 2 diabetes mellitus ([place]) 05/24/2014  .  Polysubstance abuse ([place]) 05/13/2014  . Major depression, recurrent ([place]) 05/13/2014  . Suicidal ideation 05/13/2014  . ANTICARDIOLIPIN ANTIBODY SYNDROME 05/12/2007  . Type 1 diabetes mellitus ([place]) 04/26/2007  . TOBACCO ABUSE 04/26/2007  . 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 NO PAST SURGERIES          Home Medications    Prior to Admission medications   Medication Sig Start Date End Date Taking? Authorizing Provider  blood glucose meter kit and supplies Dispense based on patient and insurance preference. Use up to four times daily as directed. (FOR ICD-10 E10.9, E11.9). 04/02/18   Dessa Phi, DO  FLUoxetine (PROZAC) 20 MG capsule Take 1 capsule (20 mg total) by mouth daily. For mood control Patient not taking: Reported on 11/15/2018 06/03/18   Money, Lowry Ram, FNP  gabapentin (NEURONTIN) 100 MG capsule Take 1 capsule (100 mg total) by mouth 3 (three) times daily. Patient not taking: Reported on 11/15/2018 06/03/18 07/03/18  Money, Lowry Ram, FNP  hydrOXYzine (ATARAX/VISTARIL) 25 MG tablet Take 1 tablet (25 mg total) by mouth 3 (three) times daily as needed for itching, anxiety or nausea. Patient not taking: Reported on 11/15/2018 06/03/18   Money, Lowry Ram, FNP  insulin aspart (NOVOLOG) 100 UNIT/ML injection Inject 0-9 Units into the skin 3 (three) times daily with meals. Patient taking differently: Inject 0-12 Units into the skin 3 (three) times daily with meals.  04/02/18   Dessa Phi, DO  insulin aspart protamine- aspart (NOVOLOG MIX 70/30) (70-30) 100 UNIT/ML injection Inject  0.28 mLs (28 Units total) into the skin daily with supper. Patient taking differently: Inject 25-30 Units into the skin daily with supper.  06/03/18   Money, Lowry Ram, FNP  traZODone (DESYREL) 100 MG tablet Take 1 tablet (100 mg total) by mouth at bedtime as needed for sleep. Patient not taking: Reported on 11/15/2018 06/03/18   Money, Lowry Ram, FNP     Family History Family History  Problem Relation Age of Onset  . Diabetes Mother   . COPD Mother   . Heart disease Mother     Social History Social History   Tobacco Use  . Smoking status: Current Every Day Smoker    Packs/day: 1.00    Years: 25.00    Pack years: 25.00    Types: Cigarettes  . Smokeless tobacco: Never Used  Substance Use Topics  . Alcohol use: Yes    Alcohol/week: 1.0 standard drinks    Types: 1 Cans of beer per week    Comment: one beer weekly  . Drug use: Yes    Types: Marijuana, Cocaine    Comment: THC daily, Cocaine daily     Allergies   Patient has no known allergies.   Review of Systems Review of Systems  Constitutional: Negative for chills and fever.  HENT: Negative for ear pain and sore throat.   Eyes: Negative for pain and visual disturbance.  Respiratory: Negative for cough and shortness of breath.   Cardiovascular: Negative for chest pain and palpitations.  Gastrointestinal: Positive for abdominal pain (cramps), nausea and vomiting.  Genitourinary: Negative for dysuria and hematuria.  Musculoskeletal: Negative for arthralgias and back pain.  Skin: Negative for color change and rash.  Neurological: Negative for seizures and syncope.  All other systems reviewed and are negative.    Physical Exam Updated Vital Signs  ED Triage Vitals  Enc Vitals Group     BP 03/06/19 1709 113/68     Pulse Rate 03/06/19 1709 80     Resp 03/06/19 1709 20     Temp 03/06/19 1709 98.2 F (36.8 C)     Temp Source 03/06/19 1709 Oral     SpO2 03/06/19 1709 99 %     Weight 03/06/19 1709 152 lb (68.9 kg)     Height 03/06/19 1709 5' 10"  (1.778 m)     Head Circumference --      Peak Flow --      Pain Score 03/06/19 1659 9     Pain Loc --      Pain Edu? --      Excl. in [place]? --     Physical Exam Vitals signs and nursing note reviewed.  Constitutional:      General: He is not in acute distress.    Appearance: He is well-developed. He is  ill-appearing.  HENT:     Head: Normocephalic and atraumatic.     Mouth/Throat:     Mouth: Mucous membranes are dry.  Eyes:     Extraocular Movements: Extraocular movements intact.     Conjunctiva/sclera: Conjunctivae normal.     Pupils: Pupils are equal, round, and reactive to light.  Neck:     Musculoskeletal: Normal range of motion and neck supple.  Cardiovascular:     Rate and Rhythm: Normal rate and regular rhythm.     Pulses: Normal pulses.     Heart sounds: Normal heart sounds. No murmur.  Pulmonary:     Effort: Pulmonary effort is normal. No respiratory distress.  Breath sounds: Normal breath sounds.  Abdominal:     General: Abdomen is flat. There is no distension.     Palpations: Abdomen is soft.     Tenderness: There is no abdominal tenderness.  Musculoskeletal:     Right lower leg: No edema.     Left lower leg: No edema.  Skin:    General: Skin is warm and dry.     Capillary Refill: Capillary refill takes less than 2 seconds.  Neurological:     General: No focal deficit present.     Mental Status: He is alert.      ED Treatments / Results  Labs (all labs ordered are listed, but only abnormal results are displayed) Labs Reviewed  CBC WITH DIFFERENTIAL/PLATELET - Abnormal; Notable for the following components:      Result Value   Neutro Abs 7.8 (*)    Abs Immature Granulocytes 0.15 (*)    All other components within normal limits  BASIC METABOLIC PANEL - Abnormal; Notable for the following components:   Sodium 132 (*)    Potassium 7.4 (*)    Chloride 91 (*)    CO2 17 (*)    Glucose, Bld 754 (*)    BUN 26 (*)    Creatinine, Ser 2.28 (*)    GFR calc non Af Amer 33 (*)    GFR calc Af Amer 38 (*)    Anion gap 24 (*)    All other components within normal limits  URINALYSIS, ROUTINE W REFLEX MICROSCOPIC - Abnormal; Notable for the following components:   Color, Urine STRAW (*)    Glucose, UA >=500 (*)    Ketones, ur 80 (*)    All other components  within normal limits  HEPATIC FUNCTION PANEL - Abnormal; Notable for the following components:   Total Protein 6.2 (*)    ALT 48 (*)    Total Bilirubin 2.6 (*)    Indirect Bilirubin 2.4 (*)    All other components within normal limits  CBG MONITORING, ED - Abnormal; Notable for the following components:   Glucose-Capillary >600 (*)    All other components within normal limits  POCT I-STAT EG7 - Abnormal; Notable for the following components:   pH, Ven 7.216 (*)    pCO2, Ven 41.6 (*)    pO2, Ven 113.0 (*)    Bicarbonate 16.9 (*)    TCO2 18 (*)    Acid-base deficit 11.0 (*)    Sodium 128 (*)    Potassium 7.1 (*)    Calcium, Ion 1.11 (*)    All other components within normal limits  SARS CORONAVIRUS 2 (HOSPITAL ORDER, [facility])  LIPASE, BLOOD  RAPID URINE DRUG SCREEN, HOSP PERFORMED  HIV ANTIBODY (ROUTINE TESTING W REFLEX)  NA AND K (SODIUM & POTASSIUM), RAND UR  CREATININE, URINE, RANDOM  CBC WITH DIFFERENTIAL/PLATELET  BASIC METABOLIC PANEL  BASIC METABOLIC PANEL  BASIC METABOLIC PANEL  HEPATIC FUNCTION PANEL  HEPATITIS PANEL, ACUTE    EKG EKG Interpretation  Date/Time:  Sunday March 06 2019 17:08:33 EDT Ventricular Rate:  76 PR Interval:    QRS Duration: 97 QT Interval:  415 QTC Calculation: 467 R Axis:   77 Text Interpretation:  Sinus rhythm Biatrial enlargement RSR' in V1 or V2, probably normal variant Left ventricular hypertrophy peaked t waves Confirmed by Lennice Sites [phone]) on 03/06/2019 5:53:52 PM   Radiology No results found.  Procedures .Critical Care Performed by: Lennice Sites, DO Authorized by: Ronnald Nian,  Yvette Loveless, DO   Critical care provider statement:    Critical care time (minutes):  45   Critical care time was exclusive of:  Separately billable procedures and treating other patients and teaching time   Critical care was necessary to treat or prevent imminent or life-threatening deterioration of the following  conditions:  Dehydration, endocrine crisis and metabolic crisis   Critical care was time spent personally by me on the following activities:  Blood draw for specimens, development of treatment plan with patient or surrogate, discussions with primary provider, evaluation of patient's response to treatment, examination of patient, obtaining history from patient or surrogate, ordering and performing treatments and interventions, ordering and review of laboratory studies, ordering and review of radiographic studies, pulse oximetry, re-evaluation of patient's condition and review of old charts   I assumed direction of critical care for this patient from another provider in my specialty: no     (including critical care time)  Medications Ordered in ED Medications  sodium chloride 0.9 % bolus 1,000 mL (has no administration in time range)  sodium zirconium cyclosilicate (LOKELMA) packet 10 g (has no administration in time range)  insulin regular, human (MYXREDLIN) 100 units/ 100 mL infusion (has no administration in time range)  0.9 %  sodium chloride infusion (has no administration in time range)  dextrose 5 %-0.45 % sodium chloride infusion (has no administration in time range)  insulin regular, human (MYXREDLIN) 100 units/ 100 mL infusion (has no administration in time range)  sodium bicarbonate injection 50 mEq (has no administration in time range)  heparin injection 5,000 Units (has no administration in time range)  sodium chloride 0.9 % bolus 1,000 mL (1,000 mLs Intravenous New Bag/Given 03/06/19 1745)  ondansetron (ZOFRAN) injection 4 mg (4 mg Intravenous Given 03/06/19 1745)  fentaNYL (SUBLIMAZE) injection 50 mcg (50 mcg Intravenous Given 03/06/19 1813)  calcium gluconate inj 10% (1 g) URGENT USE ONLY! (1 g Intravenous Given 03/06/19 1852)     Initial Impression / Assessment and Plan / ED Course  I have reviewed the triage vital signs and the nursing notes.  Pertinent labs & imaging results that  were available during my care of the patient were reviewed by me and considered in my medical decision making (see chart for details).     John Cox is a 49 year old male with history of diabetes, substance abuse who presents to the ED with nausea, vomiting, hyperglycemia.  Patient possibly not compliant with his medications.  Blood sugar elevated upon arrival.  Patient found to be in diabetic ketoacidosis.  Hyperkalemic to 7.2.  EKG showed hyperkalemic changes.  Patient was given calcium IV.  Patient with pH of 7.2.  Low bicarb.  Elevated anion gap.  AKI.  Negative for UTI.  Lipase normal.  Overall patient appears to be in diabetic ketoacidosis likely in the setting of noncompliance.  Was started on insulin infusion.  Hyperkalemia was treated with calcium as well.  Hemodynamically stable throughout my care.  Was given 2 L of normal saline.  Admitted to medicine for further care.  This chart was dictated using voice recognition software.  Despite best efforts to proofread,  errors can occur which can change the documentation meaning.    Final Clinical Impressions(s) / ED Diagnoses   Final diagnoses:  Diabetic ketoacidosis without coma associated with other specified diabetes mellitus [facility])  Hyperkalemia    ED Discharge Orders    None       Lennice Sites, DO 03/06/19 1944

## 2019-03-06 NOTE — ED Notes (Signed)
Dr Curatolo informed of EG7 results 

## 2019-03-06 NOTE — ED Notes (Signed)
Attempted to call report.  Was told that charge nurse was calling bed placement regarding patient

## 2019-03-07 LAB — BASIC METABOLIC PANEL
Anion gap: 11 (ref 5–15)
Anion gap: 13 (ref 5–15)
Anion gap: 13 (ref 5–15)
Anion gap: 18 — ABNORMAL HIGH (ref 5–15)
BUN: 24 mg/dL — ABNORMAL HIGH (ref 6–20)
BUN: 26 mg/dL — ABNORMAL HIGH (ref 6–20)
BUN: 26 mg/dL — ABNORMAL HIGH (ref 6–20)
BUN: 29 mg/dL — ABNORMAL HIGH (ref 6–20)
CO2: 19 mmol/L — ABNORMAL LOW (ref 22–32)
CO2: 21 mmol/L — ABNORMAL LOW (ref 22–32)
CO2: 25 mmol/L (ref 22–32)
CO2: 26 mmol/L (ref 22–32)
Calcium: 8.1 mg/dL — ABNORMAL LOW (ref 8.9–10.3)
Calcium: 8.5 mg/dL — ABNORMAL LOW (ref 8.9–10.3)
Calcium: 8.5 mg/dL — ABNORMAL LOW (ref 8.9–10.3)
Calcium: 8.8 mg/dL — ABNORMAL LOW (ref 8.9–10.3)
Chloride: 101 mmol/L (ref 98–111)
Chloride: 106 mmol/L (ref 98–111)
Chloride: 106 mmol/L (ref 98–111)
Chloride: 108 mmol/L (ref 98–111)
Creatinine, Ser: 1.57 mg/dL — ABNORMAL HIGH (ref 0.61–1.24)
Creatinine, Ser: 1.64 mg/dL — ABNORMAL HIGH (ref 0.61–1.24)
Creatinine, Ser: 1.79 mg/dL — ABNORMAL HIGH (ref 0.61–1.24)
Creatinine, Ser: 2.09 mg/dL — ABNORMAL HIGH (ref 0.61–1.24)
GFR calc Af Amer: 42 mL/min — ABNORMAL LOW (ref >60)
GFR calc Af Amer: 51 mL/min — ABNORMAL LOW (ref >60)
GFR calc Af Amer: 56 mL/min — ABNORMAL LOW (ref >60)
GFR calc Af Amer: 60 mL/min — ABNORMAL LOW (ref >60)
GFR calc non Af Amer: 36 mL/min — ABNORMAL LOW (ref >60)
GFR calc non Af Amer: 44 mL/min — ABNORMAL LOW (ref >60)
GFR calc non Af Amer: 49 mL/min — ABNORMAL LOW (ref >60)
GFR calc non Af Amer: 51 mL/min — ABNORMAL LOW (ref >60)
Glucose, Bld: 146 mg/dL — ABNORMAL HIGH (ref 70–99)
Glucose, Bld: 308 mg/dL — ABNORMAL HIGH (ref 70–99)
Glucose, Bld: 371 mg/dL — ABNORMAL HIGH (ref 70–99)
Glucose, Bld: 88 mg/dL (ref 70–99)
Potassium: 4.1 mmol/L (ref 3.5–5.1)
Potassium: 4.2 mmol/L (ref 3.5–5.1)
Potassium: 4.3 mmol/L (ref 3.5–5.1)
Potassium: 4.9 mmol/L (ref 3.5–5.1)
Sodium: 135 mmol/L (ref 135–145)
Sodium: 143 mmol/L (ref 135–145)
Sodium: 144 mmol/L (ref 135–145)
Sodium: 145 mmol/L (ref 135–145)

## 2019-03-07 LAB — CBC WITH DIFFERENTIAL/PLATELET
Abs Immature Granulocytes: 0.11 10*3/uL — ABNORMAL HIGH (ref 0.00–0.07)
Basophils Absolute: 0 10*3/uL (ref 0.0–0.1)
Basophils Relative: 0 %
Eosinophils Absolute: 0 10*3/uL (ref 0.0–0.5)
Eosinophils Relative: 0 %
HCT: 41.8 % (ref 39.0–52.0)
Hemoglobin: 13.9 g/dL (ref 13.0–17.0)
Immature Granulocytes: 1 %
Lymphocytes Relative: 19 %
Lymphs Abs: 1.6 10*3/uL (ref 0.7–4.0)
MCH: 27.9 pg (ref 26.0–34.0)
MCHC: 33.3 g/dL (ref 30.0–36.0)
MCV: 83.9 fL (ref 80.0–100.0)
Monocytes Absolute: 0.7 10*3/uL (ref 0.1–1.0)
Monocytes Relative: 9 %
Neutro Abs: 5.9 10*3/uL (ref 1.7–7.7)
Neutrophils Relative %: 71 %
Platelets: 265 10*3/uL (ref 150–400)
RBC: 4.98 MIL/uL (ref 4.22–5.81)
RDW: 14.6 % (ref 11.5–15.5)
WBC: 8.4 10*3/uL (ref 4.0–10.5)
nRBC: 0 % (ref 0.0–0.2)

## 2019-03-07 LAB — HEPATIC FUNCTION PANEL
ALT: 36 U/L (ref 0–44)
AST: 19 U/L (ref 15–41)
Albumin: 2.9 g/dL — ABNORMAL LOW (ref 3.5–5.0)
Alkaline Phosphatase: 64 U/L (ref 38–126)
Bilirubin, Direct: 0.2 mg/dL (ref 0.0–0.2)
Indirect Bilirubin: 1.3 mg/dL — ABNORMAL HIGH (ref 0.3–0.9)
Total Bilirubin: 1.5 mg/dL — ABNORMAL HIGH (ref 0.3–1.2)
Total Protein: 4.9 g/dL — ABNORMAL LOW (ref 6.5–8.1)

## 2019-03-07 LAB — GLUCOSE, CAPILLARY
Glucose-Capillary: 109 mg/dL — ABNORMAL HIGH (ref 70–99)
Glucose-Capillary: 151 mg/dL — ABNORMAL HIGH (ref 70–99)
Glucose-Capillary: 197 mg/dL — ABNORMAL HIGH (ref 70–99)
Glucose-Capillary: 230 mg/dL — ABNORMAL HIGH (ref 70–99)
Glucose-Capillary: 246 mg/dL — ABNORMAL HIGH (ref 70–99)
Glucose-Capillary: 354 mg/dL — ABNORMAL HIGH (ref 70–99)
Glucose-Capillary: 390 mg/dL — ABNORMAL HIGH (ref 70–99)
Glucose-Capillary: 80 mg/dL (ref 70–99)
Glucose-Capillary: 86 mg/dL (ref 70–99)
Glucose-Capillary: 94 mg/dL (ref 70–99)

## 2019-03-07 LAB — HEMOGLOBIN A1C
Hgb A1c MFr Bld: 12.3 % — ABNORMAL HIGH (ref 4.8–5.6)
Mean Plasma Glucose: 306.31 mg/dL

## 2019-03-07 LAB — HIV ANTIBODY (ROUTINE TESTING W REFLEX): HIV Screen 4th Generation wRfx: NONREACTIVE

## 2019-03-07 MED ORDER — INSULIN GLARGINE 100 UNIT/ML ~~LOC~~ SOLN
10.0000 [IU] | Freq: Every day | SUBCUTANEOUS | Status: DC
  Filled 2019-03-07: qty 0.1

## 2019-03-07 MED ORDER — INSULIN ASPART 100 UNIT/ML ~~LOC~~ SOLN: 0.0000 [IU] | SUBCUTANEOUS | Status: DC

## 2019-03-07 MED ORDER — FAMOTIDINE 20 MG PO TABS
20.0000 mg | ORAL_TABLET | Freq: Every day | ORAL | Status: DC
  Administered 2019-03-07 – 2019-03-08 (×2): 20 mg via ORAL
  Filled 2019-03-07 (×2): qty 1

## 2019-03-07 MED ORDER — GLUCERNA SHAKE PO LIQD
237.0000 mL | Freq: Three times a day (TID) | ORAL | Status: DC
  Administered 2019-03-07 – 2019-03-08 (×3): 237 mL via ORAL

## 2019-03-07 MED ORDER — INSULIN ASPART 100 UNIT/ML ~~LOC~~ SOLN
0.0000 [IU] | Freq: Three times a day (TID) | SUBCUTANEOUS | Status: DC
  Administered 2019-03-07 (×2): 15 [IU] via SUBCUTANEOUS
  Administered 2019-03-08: 8 [IU] via SUBCUTANEOUS
  Administered 2019-03-08: 2 [IU] via SUBCUTANEOUS

## 2019-03-07 MED ORDER — INSULIN GLARGINE 100 UNIT/ML ~~LOC~~ SOLN: 10.0000 [IU] | Freq: Every day | SUBCUTANEOUS | Status: DC

## 2019-03-07 MED ORDER — SODIUM CHLORIDE 0.9 % IV SOLN
INTRAVENOUS | Status: DC
  Administered 2019-03-07: 06:00:00 via INTRAVENOUS

## 2019-03-07 MED ORDER — INSULIN GLARGINE 100 UNIT/ML ~~LOC~~ SOLN
5.0000 [IU] | Freq: Once | SUBCUTANEOUS | Status: AC
  Administered 2019-03-07: 5 [IU] via SUBCUTANEOUS
  Filled 2019-03-07: qty 0.05

## 2019-03-07 MED ORDER — INSULIN DETEMIR 100 UNIT/ML ~~LOC~~ SOLN: 15.0000 [IU] | Freq: Every day | SUBCUTANEOUS | Status: DC

## 2019-03-07 MED ORDER — INSULIN GLARGINE 100 UNIT/ML ~~LOC~~ SOLN
15.0000 [IU] | Freq: Once | SUBCUTANEOUS | Status: AC
  Administered 2019-03-07: 15 [IU] via SUBCUTANEOUS
  Filled 2019-03-07: qty 0.15

## 2019-03-07 MED ORDER — INSULIN DETEMIR 100 UNIT/ML ~~LOC~~ SOLN
20.0000 [IU] | Freq: Every day | SUBCUTANEOUS | Status: DC
  Filled 2019-03-07: qty 0.2

## 2019-03-07 MED ORDER — INSULIN GLARGINE 100 UNIT/ML ~~LOC~~ SOLN: 10.0000 [IU] | Freq: Once | SUBCUTANEOUS | Status: DC

## 2019-03-07 NOTE — Plan of Care (Signed)
POC initiated and progressing. 

## 2019-03-07 NOTE — Progress Notes (Signed)
PROGRESS NOTE    Rhae LernerWilliam B Standlee  NWG:[id number] DOB: Apr 07, 1971 DOA: 03/06/2019 PCP: Patient, No Pcp Per    Brief Narrative:  48 year old male presented abdominal pain, nausea and vomiting.  He does have significant past medical history for type 2 diabetes mellitus, depression, and polysubstance abuse.  Reported 3 days of generalized weakness, malaise, vomiting and abdominal discomfort.  He is known to be noncompliant with his diabetic regimen.  On his initial physical examination blood pressure 107/60, heart rate 80, respiratory 22, oxygen saturation 98%.  He had moist mucous membranes, lungs clear to auscultation bilaterally, heart S1-S2 present regular, abdomen with generalized tenderness, no rebound or guarding, no lower extremity edema.  Sodium 132, potassium 7.4, chloride 91, bicarb 17, glucose 754, BUN 26, creatinine 2.6, anion gap 24, 9.2, hemoglobin 15.1, hematocrit 47.6, platelets 253. SARS Covid 19 negative.  Ueine analysis more than 500 glucose.  Specific gravity 1.026.  KG 76 bpm, normal axis, normal intervals, sinus rhythm, no ST segment changes, peaked T waves (new).  Patient was admitted to the hospital with the working diagnosis of diabetes ketoacidosis complicated by hyperkalemia.   Assessment & Plan:   Principal Problem:   Hyperkalemia Active Problems:   Major depression, recurrent ([facility])   DKA (diabetic ketoacidoses) ([facility])   AKI (acute kidney injury) ([facility])   Hyperbilirubinemia   1. DKA. Fasting glucose this am at 88 with anion gap at 13, received 5 units of insulin glragine at 5 am, now off insulin drip. No nausea or vomiting. At home uses about 90 units of insulin long acting. Will continue glucose cover and monitoring, add 10 units of insulin levimir at 18:00 pm.   2. T2DM. Patient has been non compliant with insulin therapy, he run out insulin about 24 H before hospitalization, uses about 90 units per day of long acting insulin. His Hgb A1c is 12,3.   3. AKI with  hyperkalemia. This am serum cr is 1,64, with K ar 4,3 and serum bicarbonate at 26, Cl 106. Tolerating po well. Will hold on IV fluids and will follow renal panel this pm. Avoid nephrotoxic medications and hypotension.   4. Depression and polysubstance abuse. No agitation or confusion, not taking medical therapy at home.   DVT prophylaxis: enoxaparin   Code Status: full Family Communication: no family at the bedside  Disposition Plan/ discharge barriers: pending clinical improvement.   Body mass index is 20.26 kg/m. Malnutrition Type:      Malnutrition Characteristics:      Nutrition Interventions:     RN Pressure Injury Documentation:     Consultants:     Procedures:     Antimicrobials:       Subjective: Patient is feeling better but not yet back to baseline, continue to be very weak and deconditioned, no nausea or vomiting, no chest pain, dyspnea or further abdominal pain.   Objective: Vitals:   03/06/19 2210 03/06/19 2332 03/07/19 0344 03/07/19 0607  BP: 107/83 113/66 (!) 103/59 106/69  Pulse: 92 87 85 74  Resp: 20 (!) 25 20 16   Temp: 98.8 F (37.1 C) 98.8 F (37.1 C) 97.8 F (36.6 C) 98 F (36.7 C)  TempSrc: Oral Oral Oral Oral  SpO2: 99% 97% 93% 97%  Weight: 65.9 kg     Height: 5\' 11"  (1.803 m)       Intake/Output Summary (Last 24 hours) at 03/07/2019 0810 Last data filed at 03/07/2019 0600 Gross per 24 hour  Intake 1672.51 ml  Output 750 ml  Net 922.51 ml   Filed Weights   03/06/19 1709 03/06/19 2210  Weight: 68.9 kg 65.9 kg    Examination:   General: deconditioned  Neurology: Awake and alert, non focal  E ENT: mild pallor, no icterus, oral mucosa moist Cardiovascular: No JVD. S1-S2 present, rhythmic, no gallops, rubs, or murmurs. No lower extremity edema. Pulmonary:  positive breath sounds bilaterally, adequate air movement, no wheezing, rhonchi or rales. Gastrointestinal. Abdomen with no organomegaly, non tender, no rebound or  guarding Skin. No rashes Musculoskeletal: no joint deformities     Data Reviewed: I have personally reviewed following labs and imaging studies  CBC: Recent Labs  Lab 03/06/19 1734 03/06/19 1904 03/07/19 0241  WBC 9.2  --  8.4  NEUTROABS 7.8*  --  5.9  HGB 15.1 17.0 13.9  HCT 47.6 50.0 41.8  MCV 87.8  --  83.9  PLT 253  --  161   Basic Metabolic Panel: Recent Labs  Lab 03/06/19 1734 03/06/19 1904 03/06/19 2356 03/07/19 0241 03/07/19 0702  NA 132* 128* 143 144 145  K 7.4* 7.1* 4.9 4.2 4.3  CL 91*  --  106 108 106  CO2 17*  --  19* 25 26  GLUCOSE 754*  --  308* 146* 88  BUN 26*  --  26* 26* 24*  CREATININE 2.28*  --  1.79* 1.57* 1.64*  CALCIUM 9.1  --  8.8* 8.5* 8.5*   GFR: Estimated Creatinine Clearance: 51.3 mL/min (A) (by C-G formula based on SCr of 1.64 mg/dL (H)). Liver Function Tests: Recent Labs  Lab 03/06/19 1734 03/07/19 0702  AST 31 19  ALT 48* 36  ALKPHOS 80 64  BILITOT 2.6* 1.5*  PROT 6.2* 4.9*  ALBUMIN 3.5 2.9*   Recent Labs  Lab 03/06/19 1734  LIPASE 21   No results for input(s): AMMONIA in the last 168 hours. Coagulation Profile: No results for input(s): INR, PROTIME in the last 168 hours. Cardiac Enzymes: No results for input(s): CKTOTAL, CKMB, CKMBINDEX, TROPONINI in the last 168 hours. BNP (last 3 results) No results for input(s): PROBNP in the last 8760 hours. HbA1C: Recent Labs    03/07/19 0702  HGBA1C 12.3*   CBG: Recent Labs  Lab 03/07/19 0132 03/07/19 0236 03/07/19 0342 03/07/19 0444 03/07/19 0546  GLUCAP 197* 151* 109* 86 80   Lipid Profile: No results for input(s): CHOL, HDL, LDLCALC, TRIG, CHOLHDL, LDLDIRECT in the last 72 hours. Thyroid Function Tests: No results for input(s): TSH, T4TOTAL, FREET4, T3FREE, THYROIDAB in the last 72 hours. Anemia Panel: No results for input(s): VITAMINB12, FOLATE, FERRITIN, TIBC, IRON, RETICCTPCT in the last 72 hours.    Radiology Studies: I have reviewed all of the  imaging during this hospital visit personally     Scheduled Meds: . heparin  5,000 Units Subcutaneous Q8H  . insulin aspart  0-15 Units Subcutaneous Q4H  . sodium zirconium cyclosilicate  10 g Oral Daily   Continuous Infusions: . sodium chloride 50 mL/hr at 03/07/19 0552     LOS: 1 day        Roniesha Hollingshead Gerome Apley, MD

## 2019-03-07 NOTE — Progress Notes (Signed)
Initial Nutrition Assessment  DOCUMENTATION CODES:   Non-severe (moderate) malnutrition in context of chronic illness  INTERVENTION:   - Glucerna Shake po TID, each supplement provides 220 kcal and 10 grams of protein  - Attached "Carbohydrate Counting for People with Diabetes" handout from the Academy of [employer] and Dietetics to pt's d/c summary  NUTRITION DIAGNOSIS:   Moderate Malnutrition related to chronic illness (uncontrolled T2DM) as evidenced by mild fat depletion, mild muscle depletion, moderate muscle depletion.  GOAL:   Patient will meet greater than or equal to 90% of their needs  MONITOR:   PO intake, Supplement acceptance, Labs, I & O's, Weight trends  REASON FOR ASSESSMENT:   Malnutrition Screening Tool    ASSESSMENT:   48 year old male who presented to the ED on 7/05 with weakness, N/V, and hyperglycemia. PMH of DM, depression, polysubstance abuse. 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  Spoke with pt at bedside. Pt very lethargic and did not awaken to RD voice. Pt finally awoke when RD touched pt's shoulder a few times. Pt did not provide much history. Suspect this is related to pt having just woken up. Pt fell asleep multiple times throughout RD visit.  Due to pt's lethargy, RD will attach "Carbohydrate Counting for People with Diabetes" to pt's d/c instructions rather than discussing handout in person.  Reviewed Diabetes Coordinator note. Pt with T2DM. Per Diabetes Coordinator, pt reports using his mom's Levemir the past 2 months and states he did not check his blood sugars because he did not have a meter.  Pt reports having a good appetite and eating well at breakfast and lunch today.  Pt endorses weight loss and reports his UBW as 160 lbs. Pt states he last weighed this "a few weeks ago." Pt believes he has lost weight due to his uncontrolled blood sugars. Pt unable to provide much history regarding PO intake PTA. Pt  states that he "ate well."  Reviewed weight history in chart. Pt with a 4 kg weight loss since 06/26/18. This is a 5.7% weight loss which is not significant for timeframe.  Meal Completion: 50% x 1 recorded meal  Medications reviewed and include: Pepcid, SSI, Levemir 15 units once, Levemir 20 units daily starting 7/07  Labs reviewed: BUN 24, creatinine 1.64, hemoglobin A1C 12.3 CBG's: 80-360 x 12 hours  NUTRITION - FOCUSED PHYSICAL EXAM:    Most Recent Value  Orbital Region  No depletion  Upper Arm Region  Mild depletion  Thoracic and Lumbar Region  Mild depletion  Buccal Region  Mild depletion  Temple Region  No depletion  Clavicle Bone Region  Mild depletion  Clavicle and Acromion Bone Region  Moderate depletion  Scapular Bone Region  Mild depletion  Dorsal Hand  No depletion  Patellar Region  No depletion  Anterior Thigh Region  Mild depletion  Posterior Calf Region  Mild depletion  Edema (RD Assessment)  None  Hair  Reviewed  Eyes  Reviewed  Mouth  Reviewed  Skin  Reviewed  Nails  Reviewed       Diet Order:   Diet Order            Diet renal/carb modified with fluid restriction Diet-HS Snack? Nothing; Fluid restriction: 1200 mL Fluid; Room service appropriate? Yes; Fluid consistency: Thin  Diet effective now              EDUCATION NEEDS:   Education needs have been addressed  Skin:  Skin Assessment: Reviewed RN  Assessment  Last BM:  03/04/19  Height:   Ht Readings from Last 1 Encounters:  03/06/19 5\' 11"  (1.803 m)    Weight:   Wt Readings from Last 1 Encounters:  03/06/19 65.9 kg    Ideal Body Weight:  78.2 kg  BMI:  Body mass index is 20.26 kg/m.  Estimated Nutritional Needs:   Kcal:  2050-2250  Protein:  90-105 grams  Fluid:  >/= 2.0 L    Gaynell Face, MS, RD, LDN Inpatient Clinical Dietitian Pager: [phone] Weekend/After Hours: [phone]

## 2019-03-07 NOTE — Progress Notes (Signed)
Pt arrived to floor via stretcher, alert and oriented walked to the bed. Stand up scale weight taken, CHG bath given, Heart monitor applied and CCMD notified.Pt is NSR on the monitor and sat 94% on RA. Gluco stabilizer resumed. Pt introduced to nursing staff and oriented to room and equipment.

## 2019-03-07 NOTE — Discharge Instructions (Signed)
CARBOHYDRATE COUNTING FOR PEOPLE WITH DIABETES  Why Is Carbohydrate Counting Important?  Counting carbohydrate servings may help you control your blood glucose level so that you feel better.  The balance between the carbohydrates you eat and insulin determines what your blood glucose level will be after eating.  Carbohydrate counting can also help you plan your meals.  Which Foods Have Carbohydrates? Foods with carbohydrates include:  Breads, crackers, and cereals  Pasta, rice, and grains  Starchy vegetables, such as potatoes, corn, and peas  Beans and legumes  Milk, soy milk, and yogurt  Fruits and fruit juices  Sweets, such as cakes, cookies, ice cream, jam, and jelly Carbohydrate Servings In diabetes meal planning, 1 serving of a food with carbohydrate has about 15 grams of carbohydrate:  Check serving sizes with measuring cups and spoons or a food scale.  Read the Nutrition Facts on food labels to find out how many grams of carbohydrate are in foods you eat. The food lists in this handout show portions that have about 15 grams of carbohydrate.  Meal Planning Tips  An Eating Plan tells you how many carbohydrate servings to eat at your meals and snacks. For many adults, eating 3 to 5 servings of carbohydrate foods at each meal and 1 or 2 carbohydrate servings for each snack works well.  In a healthy daily Eating Plan, most carbohydrates come from: ? At least 6 servings of fruits and nonstarchy vegetables ? At least 6 servings of grains, beans, and starchy vegetables, with at least 3 servings from whole grains ? At least 2 servings of milk or milk products  Check your blood glucose level regularly. It can tell you if you need to adjust when you eat carbohydrates.  Eating foods that have fiber, such as whole grains, and having very few salty foods is good for your health.  Eat 4 to 6 ounces of meat or other protein foods (such as soybean burgers) each day. Choose  low-fat sources of protein, such as lean beef, lean pork, chicken, fish, low-fat cheese, or vegetarian foods such as soy.  Eat some healthy fats, such as olive oil, canola oil, and nuts.  Eat very little saturated fats. These unhealthy fats are found in butter, cream, and high-fat meats, such as bacon and sausage.  Eat very little or no trans fats. These unhealthy fats are found in all foods that list partially hydrogenated oil as an ingredient.  Label Reading Tips The Nutrition Facts panel on a label lists the grams of total carbohydrate in1 standard serving. The labels standard serving may be larger or smaller than 1 carbohydrate serving. To figure out how many carbohydrate servings are in the food:  First, look at the labels standard serving size.  Check the grams of total carbohydrate. This is the amount of carbohydrate in 1 standard serving.  Divide the grams of total carbohydrate by 15. This number equals the number of carbohydrate servings in 1 standard serving. Remember: 1 carbohydrate serving is 15 grams of carbohydrate.  Note: You may ignore the grams of sugars on the Nutrition Facts panel because they are included in the grams of total carbohydrate.  Foods Recommended 1 serving = about 15 grams of carbohydrate  Starches  1 slice bread (1 ounce)  1 tortilla (6-inch size)   large bagel (1 ounce)  2 taco shells (5-inch size)   hamburger or hot dog bun ( ounce)   cup ready-to-eat unsweetened cereal   cup cooked cereal  1 cup  broth-based soup  4 to 6 small crackers  1/3 cup pasta or rice (cooked)   cup beans, peas, corn, sweet potatoes, winter squash, or mashed or boiled potatoes (cooked)   large baked potato (3 ounces)   ounce pretzels, potato chips, or tortilla chips  3 cups popcorn (popped)  Fruit  1 small fresh fruit ( to 1 cup)   cup canned or frozen fruit  2 tablespoons dried fruit (blueberries, cherries, cranberries, mixed  fruit, raisins)  17 small grapes (3 ounces)  1 cup melon or berries   cup unsweetened fruit juice  Milk  1 cup fat-free or reduced-fat milk  1 cup soy milk  2/3 cup (6 ounces) nonfat yogurt sweetened with sugar-free sweetener   Sweets and Desserts  2-inch square cake (unfrosted)  2 small cookies (2/3 ounce)   cup ice cream or frozen yogurt   cup sherbet or sorbet  1 tablespoon syrup, jam, jelly, table sugar, or honey  2 tablespoons light syrup  Other Foods  Count 1 cup raw vegetables or  cup cooked nonstarchy vegetables as zero (0) carbohydrate servings or free foods. If you eat 3 or more servings at one meal, count them as 1 carbohydrate serving.  Foods that have less than 20 calories in each serving also may be counted as zero carbohydrate servings or free foods.  Count 1 cup of casserole or other mixed foods as 2 carbohydrate servings.   Sample 1 Day Menu Breakfast 1 extra-small banana (1 carbohydrate serving) 1 cup low-fat or fat-free milk (1 carbohydrate serving) 1 slice whole wheat bread (1 carbohydrate serving) 1 teaspoon margarine  Lunch 2 ounces [place] slices 2 slices whole wheat bread (2 carbohydrate servings) 2 lettuce leaves 4 celery sticks 4 carrot sticks 1 medium apple (1 carbohydrate serving) 1 cup low-fat or fat-free milk (1 carbohydrate serving)  Afternoon Snack 2 tablespoons raisins (1 carbohydrate serving) 3/4 ounce unsalted mini pretzels (1 carbohydrate serving)  Evening Meal 3 ounces lean roast beef  1/2 large baked potato (2 carbohydrate servings) 1 tablespoon reduced-fat sour cream 1/2 cup green beans 1 tablespoon light salad dressing 1 whole wheat dinner roll (1 carbohydrate serving) 1 teaspoon margarine 1 cup melon balls (1 carbohydrate serving)  Evening Snack 2 tablespoons unsalted nuts

## 2019-03-07 NOTE — Progress Notes (Signed)
Inpatient Diabetes Program Recommendations  AACE/ADA: New Consensus Statement on Inpatient Glycemic Control (2015)  Target Ranges:  Prepandial:   less than 140 mg/dL      Peak postprandial:   less than 180 mg/dL (1-2 hours)      Critically ill patients:  140 - 180 mg/dL   Lab Results  Component Value Date   GLUCAP 390 (H) 03/07/2019   HGBA1C 12.3 (H) 03/07/2019    Review of Glycemic Control  Diabetes history: DM 2  Current orders for Inpatient glycemic control:  Lantus 10 units to get tonight Novolog 0-15 units tid  Inpatient Diabetes Program Recommendations:    Spoke with patient about diabetes and home regimen for diabetes control. Patient reports that he needs a PCP. Patient reports using his mom's Levemir the past 2 months approx 90 units but did not have a glucose meter to check his glucose.   Discussed current A1C results, 12.3%. Discussed glucose and A1C goals. Discussed importance of checking CBGs and maintaining good CBG control to prevent long-term and short-term complications.   Discussed impact of nutrition, exercise, stress, sickness, and medications on diabetes control. Discussed carbohydrates, carbohydrate goals per day and meal, along with portion sizes. Patient reports drinking water and juice.  Encouraged patient to check glucose at least 2 times per day (fasting and alternating second check).  Spoke with RN CM. Patient will follow up at the [facility] health [facility] care [facility] at [facility]. Patient will utilize the match letter and can get his Rx at the [facility] pharmacy. Patient can get Rx for glucose meter filled at the pharmacy or choose to obtain meter from [facility].  verbalized understanding of information discussed and he states that he has no further questions at this time related to diabetes.  Consider Lantus 20 units (0.3 units/kg). Consider an additional 15 units today.  For D/c: Levemir Flextouch Insulin Pen (Order # [id number]) Insulin pen needles  (order # [id number]) Glucose Meter kit (order #[id number])  Thanks,  Tama Headings RN, MSN, BC-ADM Inpatient Diabetes Coordinator Team Pager [phone] (8a-5p)

## 2019-03-07 NOTE — Plan of Care (Signed)
Continue to monitor

## 2019-03-08 LAB — BASIC METABOLIC PANEL
Anion gap: 9 (ref 5–15)
BUN: 22 mg/dL — ABNORMAL HIGH (ref 6–20)
CO2: 26 mmol/L (ref 22–32)
Calcium: 8.3 mg/dL — ABNORMAL LOW (ref 8.9–10.3)
Chloride: 99 mmol/L (ref 98–111)
Creatinine, Ser: 1.42 mg/dL — ABNORMAL HIGH (ref 0.61–1.24)
GFR calc Af Amer: >60 mL/min (ref >60)
GFR calc non Af Amer: 58 mL/min — ABNORMAL LOW (ref >60)
Glucose, Bld: 363 mg/dL — ABNORMAL HIGH (ref 70–99)
Potassium: 4.7 mmol/L (ref 3.5–5.1)
Sodium: 134 mmol/L — ABNORMAL LOW (ref 135–145)

## 2019-03-08 LAB — HEPATITIS PANEL, ACUTE
HCV Ab: <0.1 s/co ratio (ref 0.0–0.9)
Hep A IgM: NEGATIVE
Hep B C IgM: NEGATIVE
Hepatitis B Surface Ag: NEGATIVE

## 2019-03-08 LAB — GLUCOSE, CAPILLARY
Glucose-Capillary: 295 mg/dL — ABNORMAL HIGH (ref 70–99)
Glucose-Capillary: 132 mg/dL — ABNORMAL HIGH (ref 70–99)

## 2019-03-08 MED ORDER — INSULIN ASPART PROT & ASPART (70-30 MIX) 100 UNIT/ML ~~LOC~~ SUSP
25.0000 [IU] | Freq: Two times a day (BID) | SUBCUTANEOUS | Status: DC
  Administered 2019-03-08: 25 [IU] via SUBCUTANEOUS
  Filled 2019-03-08: qty 10

## 2019-03-08 MED ORDER — INSULIN DETEMIR 100 UNIT/ML ~~LOC~~ SOLN
20.0000 [IU] | Freq: Two times a day (BID) | SUBCUTANEOUS | Status: DC
  Filled 2019-03-08: qty 0.2

## 2019-03-08 MED ORDER — NOVOLIN 70/30 RELION (70-30) 100 UNIT/ML ~~LOC~~ SUSP: 25.0000 [IU] | Freq: Two times a day (BID) | SUBCUTANEOUS | 0 refills | Status: DC

## 2019-03-08 MED ORDER — INSULIN ASPART 100 UNIT/ML ~~LOC~~ SOLN
3.0000 [IU] | Freq: Three times a day (TID) | SUBCUTANEOUS | Status: DC
  Administered 2019-03-08: 3 [IU] via SUBCUTANEOUS

## 2019-03-08 MED ORDER — "INSULIN SYRINGE-NEEDLE U-100 30G X 5/16"" 1 ML MISC": 1.0000 | Freq: Two times a day (BID) | 0 refills | Status: DC

## 2019-03-08 MED ORDER — BLOOD GLUCOSE METER KIT: PACK | 0 refills | Status: AC

## 2019-03-08 MED FILL — NOVOLIN 70/30 FLEXPEN (70-3: (70-30) 100 | 30 days supply | Qty: 15 | Fill #0

## 2019-03-08 MED FILL — PENTIPS 31G X 8 MM MISC: 31G X 8 MM | 30 days supply | Qty: 100 | Fill #0

## 2019-03-08 NOTE — Progress Notes (Addendum)
Pt will d/c to home today.TOC pharmacy to fill Rx Meds and deliver meds to pt's @ bedside. Match Letter given to assist with med cost, pt without job, no insurance. Pt to purchase  blood glucose meter kit and supplies per self and is aware. Pt's states has transportation to home.  Whitman Hero RN,BSN,CM

## 2019-03-08 NOTE — Discharge Summary (Signed)
Physician Discharge Summary  JACQUEL MCCAMISH ZOX:[id number] DOB: September 25, 1970 DOA: 03/06/2019  PCP: Patient, No Pcp Per  Admit date: 03/06/2019 Discharge date: 03/08/2019  Admitted From: Home  Disposition:  Home   Recommendations for Outpatient Follow-up and new medication changes:  1. Follow up with Mena Pauls NP in 7 days.  2. Patient has been resumed on insulin 70/30 25 units bid. 3. Glucometer, to check capillary glucose in am and before meals.   Home Health: no   Equipment/Devices: glucometer    Discharge Condition: stable  CODE STATUS: full  Diet recommendation: diabetic prudent diet.   Brief/Interim Summary: 48 year old male presented abdominal pain, nausea and vomiting.  He does have significant past medical history for type 2 diabetes mellitus, depression, and polysubstance abuse.  Reported 3 days of generalized weakness, malaise, vomiting and abdominal discomfort.  He is known to be noncompliant with his diabetic regimen.  On his initial physical examination blood pressure 107/60, heart rate 80, respiratory rate 22, oxygen saturation 98%.  He had moist mucous membranes, lungs clear to auscultation bilaterally, heart S1-S2 present regular, abdomen with generalized tenderness, no rebound or guarding, no lower extremity edema.  Sodium 132, potassium 7.4, chloride 91, bicarb 17, glucose 754, BUN 26, creatinine 2.6, anion gap 24, wbc 9.2, hemoglobin 15.1, hematocrit 47.6, platelets 253. SARS Covid 19 negative.  Urine analysis more than 500 glucose.  Specific gravity 1.026.  EKG 76 bpm, normal axis, normal intervals, sinus rhythm, no ST segment changes, peaked T waves (new).  Patient was admitted to the hospital with the working diagnosis of diabetes ketoacidosis complicated by AKI and hyperkalemia.  1.  Diabetes ketoacidosis.  Patient was admitted to the stepdown unit, he was placed on IV insulin infusion, with normalization of his anion gap.  His diet was advanced with good toleration  and he was successfully transitioned to subcutaneous insulin.  Over the last 24 hours he has required about 60 units of insulin including short and  long-acting.  His fasting glucose at discharge is 363, anion gap 9, no further nausea, vomiting abdominal pain.  Patient will be discharged on insulin 70/30 25 units twice daily with close glucose monitoring.  Social services have been consulted to assist in discharge planning.   2.  Type 1 diabetes mellitus.  Patient had frequent hospitalizations for decompensated diabetes mellitus, he will need a close follow-up as an outpatient, unfortunately he has no medical insurance.  His hemoglobin A1c is 12.3.  3.  Acute kidney injury with hyperkalemia. Patient tolerated well IV fluids, his hyperkalemia was corrected with insulin therapy, he received also calcium gluconate and sodium zirconium.  No further gastrointestinal symptoms.  His discharge creatinine was 1.42, potassium 4.7 and serum bicarb 26, anion gap 9.  4.  Transient hyperbilirubinemia.  Indirect hyperbilirubinemia, likely hypoperfusion related, follow-up bilirubin 1.3, AST 19 and ALT 36.   5. Depression.  No signs of decompensation, will need follow up as out patient.    Discharge Diagnoses:  Principal Problem:   Hyperkalemia Active Problems:   Major depression, recurrent ([place])   DKA (diabetic ketoacidoses) ([place])   AKI (acute kidney injury) ([place])   Hyperbilirubinemia    Discharge Instructions   Allergies as of 03/08/2019   No Known Allergies     Medication List    STOP taking these medications   insulin aspart 100 UNIT/ML injection Commonly known as: novoLOG   insulin aspart protamine- aspart (70-30) 100 UNIT/ML injection Commonly known as: NOVOLOG MIX 70/30   insulin detemir 100  UNIT/ML injection Commonly known as: LEVEMIR     TAKE these medications   blood glucose meter kit and supplies Dispense based on patient and insurance preference. Use up to four times daily as  directed. (FOR ICD-10 E10.9, E11.9).   Insulin Syringe-Needle U-100 30G X 5/16" 1 ML Misc Commonly known as: RELION INSULIN SYR 1CC/30G 1 Syringe by Does not apply route 2 (two) times a day.   NovoLIN 70/30 ReliOn (70-30) 100 UNIT/ML injection Generic drug: insulin NPH-regular Human Inject 25 Units into the skin 2 (two) times daily with a meal.      Follow-up Information    [place] [place] [facility] [place]. Go on 03/18/2019.   Specialty: Internal Medicine Why: 9:20 am with Mena Pauls NP Contact information: [place] [phone]         No Known Allergies  Consultations:  none   Procedures/Studies:  No results found.   Procedures:   Subjective: Patient is feeling better, no nausea or vomiting and tolerating po well, no dyspnea or chest pain.   Discharge Exam: Vitals:   03/07/19 2014 03/08/19 0316  BP: 116/71 105/70  Pulse: 78 63  Resp:  18  Temp: 98 F (36.7 C) 98 F (36.7 C)  SpO2: 96% 99%   Vitals:   03/07/19 0900 03/07/19 1225 03/07/19 2014 03/08/19 0316  BP:  116/63 116/71 105/70  Pulse:  74 78 63  Resp: 13   18  Temp:  97.7 F (36.5 C) 98 F (36.7 C) 98 F (36.7 C)  TempSrc:  Oral Oral Oral  SpO2:  98% 96% 99%  Weight:    66.5 kg  Height:        General: Not in pain or dyspnea  Neurology: Awake and alert, non focal  E ENT: no pallor, no icterus, oral mucosa moist Cardiovascular: No JVD. S1-S2 present, rhythmic, no gallops, rubs, or murmurs. No lower extremity edema. Pulmonary: vesicular breath sounds bilaterally, adequate air movement, no wheezing, rhonchi or rales. Gastrointestinal. Abdomen with no organomegaly, non tender, no rebound or guarding Skin. No rashes Musculoskeletal: no joint deformities   The results of significant diagnostics from this hospitalization (including imaging, microbiology, ancillary and laboratory) are listed below for reference.     Microbiology: Recent Results  (from the past 240 hour(s))  SARS Coronavirus 2 (CEPHEID - Performed in [place] hospital lab), Hosp Order     Status: None   Collection Time: 03/06/19  8:29 PM   Specimen: Nasopharyngeal Swab  Result Value Ref Range Status   SARS Coronavirus 2 NEGATIVE NEGATIVE Final    Comment: (NOTE) If result is NEGATIVE SARS-CoV-2 target nucleic acids are NOT DETECTED. The SARS-CoV-2 RNA is generally detectable in upper and lower  respiratory specimens during the acute phase of infection. The lowest  concentration of SARS-CoV-2 viral copies this assay can detect is 250  copies / mL. A negative result does not preclude SARS-CoV-2 infection  and should not be used as the sole basis for treatment or other  patient management decisions.  A negative result may occur with  improper specimen collection / handling, submission of specimen other  than nasopharyngeal swab, presence of viral mutation(s) within the  areas targeted by this assay, and inadequate number of viral copies  (<250 copies / mL). A negative result must be combined with clinical  observations, patient history, and epidemiological information. If result is POSITIVE SARS-CoV-2 target nucleic acids are DETECTED. The SARS-CoV-2 RNA is generally detectable  in upper and lower  respiratory specimens dur ing the acute phase of infection.  Positive  results are indicative of active infection with SARS-CoV-2.  Clinical  correlation with patient history and other diagnostic information is  necessary to determine patient infection status.  Positive results do  not rule out bacterial infection or co-infection with other viruses. If result is PRESUMPTIVE POSTIVE SARS-CoV-2 nucleic acids MAY BE PRESENT.   A presumptive positive result was obtained on the submitted specimen  and confirmed on repeat testing.  While 2019 novel coronavirus  (SARS-CoV-2) nucleic acids may be present in the submitted sample  additional confirmatory testing may be  necessary for epidemiological  and / or clinical management purposes  to differentiate between  SARS-CoV-2 and other Sarbecovirus currently known to infect humans.  If clinically indicated additional testing with an alternate test  methodology [phone]) is advised. The SARS-CoV-2 RNA is generally  detectable in upper and lower respiratory sp ecimens during the acute  phase of infection. The expected result is Negative. Fact Sheet for Patients:  StrictlyIdeas.no Fact Sheet for Healthcare Providers: BankingDealers.co.za This test is not yet approved or cleared by the [place] FDA and has been authorized for detection and/or diagnosis of SARS-CoV-2 by FDA under an Emergency Use Authorization (EUA).  This EUA will remain in effect (meaning this test can be used) for the duration of the COVID-19 declaration under Section 564(b)(1) of the Act, 21 U.S.C. section 360bbb-3(b)(1), unless the authorization is terminated or revoked sooner. Performed at [facility], [place] [address], [place], [place] [address]      Labs: BNP (last 3 results) No results for input(s): BNP in the last 8760 hours. Basic Metabolic Panel: Recent Labs  Lab 03/06/19 2356 03/07/19 0241 03/07/19 0702 03/07/19 1451 03/08/19 0241  NA 143 144 145 135 134*  K 4.9 4.2 4.3 4.1 4.7  CL 106 108 106 101 99  CO2 19* 25 26 21* 26  GLUCOSE 308* 146* 88 371* 363*  BUN 26* 26* 24* 29* 22*  CREATININE 1.79* 1.57* 1.64* 2.09* 1.42*  CALCIUM 8.8* 8.5* 8.5* 8.1* 8.3*   Liver Function Tests: Recent Labs  Lab 03/06/19 1734 03/07/19 0702  AST 31 19  ALT 48* 36  ALKPHOS 80 64  BILITOT 2.6* 1.5*  PROT 6.2* 4.9*  ALBUMIN 3.5 2.9*   Recent Labs  Lab 03/06/19 1734  LIPASE 21   No results for input(s): AMMONIA in the last 168 hours. CBC: Recent Labs  Lab 03/06/19 1734 03/06/19 1904 03/07/19 0241  WBC 9.2  --  8.4  NEUTROABS 7.8*  --  5.9  HGB 15.1 17.0 13.9   HCT 47.6 50.0 41.8  MCV 87.8  --  83.9  PLT 253  --  265   Cardiac Enzymes: No results for input(s): CKTOTAL, CKMB, CKMBINDEX, TROPONINI in the last 168 hours. BNP: Invalid input(s): POCBNP CBG: Recent Labs  Lab 03/07/19 0831 03/07/19 1223 03/07/19 1710 03/07/19 2021 03/08/19 0611  GLUCAP 94 390* 354* 230* 295*   D-Dimer No results for input(s): DDIMER in the last 72 hours. Hgb A1c Recent Labs    03/07/19 0702  HGBA1C 12.3*   Lipid Profile No results for input(s): CHOL, HDL, LDLCALC, TRIG, CHOLHDL, LDLDIRECT in the last 72 hours. Thyroid function studies No results for input(s): TSH, T4TOTAL, T3FREE, THYROIDAB in the last 72 hours.  Invalid input(s): FREET3 Anemia work up No results for input(s): VITAMINB12, FOLATE, FERRITIN, TIBC, IRON, RETICCTPCT in the last 72 hours. Urinalysis  Component Value Date/Time   COLORURINE STRAW (A) 03/06/2019 1739   APPEARANCEUR CLEAR 03/06/2019 1739   LABSPEC 1.026 03/06/2019 1739   PHURINE 5.0 03/06/2019 1739   GLUCOSEU >=500 (A) 03/06/2019 1739   HGBUR NEGATIVE 03/06/2019 1739   HGBUR negative 05/12/2007 1355   BILIRUBINUR NEGATIVE 03/06/2019 1739   BILIRUBINUR negative 05/23/2014 1052   KETONESUR 80 (A) 03/06/2019 1739   PROTEINUR NEGATIVE 03/06/2019 1739   UROBILINOGEN 0.2 10/04/2014 1749   NITRITE NEGATIVE 03/06/2019 1739   LEUKOCYTESUR NEGATIVE 03/06/2019 1739   Sepsis Labs Invalid input(s): PROCALCITONIN,  WBC,  LACTICIDVEN Microbiology Recent Results (from the past 240 hour(s))  SARS Coronavirus 2 (CEPHEID - Performed in [place] hospital lab), Hosp Order     Status: None   Collection Time: 03/06/19  8:29 PM   Specimen: Nasopharyngeal Swab  Result Value Ref Range Status   SARS Coronavirus 2 NEGATIVE NEGATIVE Final    Comment: (NOTE) If result is NEGATIVE SARS-CoV-2 target nucleic acids are NOT DETECTED. The SARS-CoV-2 RNA is generally detectable in upper and lower  respiratory specimens during the acute  phase of infection. The lowest  concentration of SARS-CoV-2 viral copies this assay can detect is 250  copies / mL. A negative result does not preclude SARS-CoV-2 infection  and should not be used as the sole basis for treatment or other  patient management decisions.  A negative result may occur with  improper specimen collection / handling, submission of specimen other  than nasopharyngeal swab, presence of viral mutation(s) within the  areas targeted by this assay, and inadequate number of viral copies  (<250 copies / mL). A negative result must be combined with clinical  observations, patient history, and epidemiological information. If result is POSITIVE SARS-CoV-2 target nucleic acids are DETECTED. The SARS-CoV-2 RNA is generally detectable in upper and lower  respiratory specimens dur ing the acute phase of infection.  Positive  results are indicative of active infection with SARS-CoV-2.  Clinical  correlation with patient history and other diagnostic information is  necessary to determine patient infection status.  Positive results do  not rule out bacterial infection or co-infection with other viruses. If result is PRESUMPTIVE POSTIVE SARS-CoV-2 nucleic acids MAY BE PRESENT.   A presumptive positive result was obtained on the submitted specimen  and confirmed on repeat testing.  While 2019 novel coronavirus  (SARS-CoV-2) nucleic acids may be present in the submitted sample  additional confirmatory testing may be necessary for epidemiological  and / or clinical management purposes  to differentiate between  SARS-CoV-2 and other Sarbecovirus currently known to infect humans.  If clinically indicated additional testing with an alternate test  methodology [phone]) is advised. The SARS-CoV-2 RNA is generally  detectable in upper and lower respiratory sp ecimens during the acute  phase of infection. The expected result is Negative. Fact Sheet for Patients:   StrictlyIdeas.no Fact Sheet for Healthcare Providers: BankingDealers.co.za This test is not yet approved or cleared by the [place] FDA and has been authorized for detection and/or diagnosis of SARS-CoV-2 by FDA under an Emergency Use Authorization (EUA).  This EUA will remain in effect (meaning this test can be used) for the duration of the COVID-19 declaration under Section 564(b)(1) of the Act, 21 U.S.C. section 360bbb-3(b)(1), unless the authorization is terminated or revoked sooner. Performed at [facility], [place] [address], [place], [place] [address]      Time coordinating discharge: 45 minutes  SIGNED:   Tawni Millers, MD  [facility] 03/08/2019, 9:04 AM

## 2019-03-08 NOTE — Progress Notes (Signed)
Discharge instructions reviewed with patient at this time. All questions answered.   Emelda Fear, RN

## 2019-03-08 NOTE — Plan of Care (Signed)
Continue to monitor

## 2019-03-08 NOTE — Progress Notes (Signed)
IV's discontinued at this time.

## 2019-03-08 NOTE — Plan of Care (Signed)
Discharged to home

## 2019-03-18 ENCOUNTER — Ambulatory Visit: Payer: Self-pay | Admitting: Family Medicine

## 2019-08-06 ENCOUNTER — Encounter (HOSPITAL_COMMUNITY): Payer: Self-pay | Admitting: Emergency Medicine

## 2019-08-06 ENCOUNTER — Emergency Department (HOSPITAL_COMMUNITY)
Admission: EM | Admit: 2019-08-06 | Discharge: 2019-08-06 | Disposition: A | Discharge status: Home / Self Care | Payer: Self-pay | Attending: Emergency Medicine | Admitting: Emergency Medicine

## 2019-08-06 ENCOUNTER — Other Ambulatory Visit: Payer: Self-pay

## 2019-08-06 DIAGNOSIS — E860 Dehydration: Secondary | ICD-10-CM | POA: Insufficient documentation

## 2019-08-06 DIAGNOSIS — Z794 Long term (current) use of insulin: Secondary | ICD-10-CM | POA: Insufficient documentation

## 2019-08-06 DIAGNOSIS — R42 Dizziness and giddiness: Secondary | ICD-10-CM | POA: Insufficient documentation

## 2019-08-06 DIAGNOSIS — F17210 Nicotine dependence, cigarettes, uncomplicated: Secondary | ICD-10-CM | POA: Insufficient documentation

## 2019-08-06 DIAGNOSIS — E109 Type 1 diabetes mellitus without complications: Secondary | ICD-10-CM | POA: Insufficient documentation

## 2019-08-06 LAB — URINALYSIS, ROUTINE W REFLEX MICROSCOPIC
Bacteria, UA: NONE SEEN
Bilirubin Urine: NEGATIVE
Glucose, UA: 50 mg/dL — AB
Hgb urine dipstick: NEGATIVE
Ketones, ur: 5 mg/dL — AB
Leukocytes,Ua: NEGATIVE
Nitrite: NEGATIVE
Protein, ur: 30 mg/dL — AB
Specific Gravity, Urine: 1.021 (ref 1.005–1.030)
pH: 5 (ref 5.0–8.0)

## 2019-08-06 LAB — CBC
HCT: 37.6 % — ABNORMAL LOW (ref 39.0–52.0)
Hemoglobin: 12.6 g/dL — ABNORMAL LOW (ref 13.0–17.0)
MCH: 28.4 pg (ref 26.0–34.0)
MCHC: 33.5 g/dL (ref 30.0–36.0)
MCV: 84.7 fL (ref 80.0–100.0)
Platelets: 369 10*3/uL (ref 150–400)
RBC: 4.44 MIL/uL (ref 4.22–5.81)
RDW: 14.7 % (ref 11.5–15.5)
WBC: 8.2 10*3/uL (ref 4.0–10.5)
nRBC: 0 % (ref 0.0–0.2)

## 2019-08-06 LAB — BASIC METABOLIC PANEL
Anion gap: 9 (ref 5–15)
BUN: 32 mg/dL — ABNORMAL HIGH (ref 6–20)
CO2: 27 mmol/L (ref 22–32)
Calcium: 8.6 mg/dL — ABNORMAL LOW (ref 8.9–10.3)
Chloride: 100 mmol/L (ref 98–111)
Creatinine, Ser: 1.65 mg/dL — ABNORMAL HIGH (ref 0.61–1.24)
GFR calc Af Amer: 56 mL/min — ABNORMAL LOW (ref >60)
GFR calc non Af Amer: 48 mL/min — ABNORMAL LOW (ref >60)
Glucose, Bld: 126 mg/dL — ABNORMAL HIGH (ref 70–99)
Potassium: 3.7 mmol/L (ref 3.5–5.1)
Sodium: 136 mmol/L (ref 135–145)

## 2019-08-06 LAB — CBG MONITORING, ED: Glucose-Capillary: 120 mg/dL — ABNORMAL HIGH (ref 70–99)

## 2019-08-06 MED ORDER — SODIUM CHLORIDE 0.9% FLUSH: 3.0000 mL | Freq: Once | INTRAVENOUS | Status: DC

## 2019-08-06 MED ORDER — SODIUM CHLORIDE 0.9 % IV BOLUS
1000.0000 mL | Freq: Once | INTRAVENOUS | Status: AC
  Administered 2019-08-06: 1000 mL via INTRAVENOUS

## 2019-08-06 NOTE — ED Provider Notes (Signed)
[facility] EMERGENCY DEPARTMENT Provider Note   CSN: [id number] Arrival date & time: 08/06/19  8938     History   Chief Complaint Chief Complaint  Patient presents with  . Dizziness    HPI John Cox is a 48 y.o. male.  He has a history of diabetes.  He is complaining of feeling lightheaded like he might pass out that started yesterday.  No headache no numbness or weakness no difficulty with speech.  No chest pain or shortness of breath.  He denies any fevers or chills no nausea vomiting diarrhea.  No urinary symptoms.  He said he smokes marijuana and thinks it was laced with cocaine that he last used a couple of days ago.  He said he was off of his insulin for a few days but restarted it a few days ago.     The history is provided by the patient.  Dizziness Quality:  Lightheadedness Severity:  Unable to specify Onset quality:  Gradual Duration:  2 days Timing:  Intermittent Progression:  Unchanged Chronicity:  New Context: not with loss of consciousness   Relieved by:  None tried Worsened by:  Standing up Ineffective treatments:  None tried Associated symptoms: no blood in stool, no chest pain, no diarrhea, no headaches, no hearing loss, no nausea, no shortness of breath, no vision changes and no vomiting     Past Medical History:  Diagnosis Date  . AKI (acute kidney injury) ([place]) 01/25/2018  . Anxiety   . Bipolar disorder ([place])   . Depression 2000  . Diabetes mellitus 1995  . Hyperglycemia 01/25/2018  . Nausea & vomiting   . Polysubstance abuse ([place]) 2012   relapse 2 wk ago   . Suicidal ideation     Patient Active Problem List   Diagnosis Date Noted  . Hyperkalemia 03/06/2019  . Hyperbilirubinemia 03/06/2019  . Overdose 06/26/2018  . MDD (major depressive disorder), severe ([place]) 05/27/2018  . Tachy-brady syndrome ([place]) 05/26/2018  . Hypoglycemia 05/24/2018  . DKA, type 1 ([place]) 03/31/2018  . Hyperglycemia due to type 1 diabetes  mellitus ([place]) 02/23/2018  . 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 Malnutrition of moderate degree 07/19/2015  . DKA (diabetic ketoacidoses) ([place]) 07/18/2015  . AKI (acute kidney injury) ([place]) 07/18/2015  . Marijuana abuse 07/18/2015  . Dehydration 07/18/2015  . Dental caries 07/18/2015  . Hyperlipidemia associated with type 2 diabetes mellitus ([place]) 05/24/2014  . Polysubstance abuse ([place]) 05/13/2014  . Major depression, recurrent ([place]) 05/13/2014  . Suicidal ideation 05/13/2014  . ANTICARDIOLIPIN ANTIBODY SYNDROME 05/12/2007  . Type 1 diabetes mellitus ([place]) 04/26/2007  . TOBACCO ABUSE 04/26/2007  . 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 NO PAST SURGERIES          Home Medications    Prior to Admission medications   Medication Sig Start Date End Date Taking? Authorizing Provider  blood glucose meter kit and supplies Dispense based on patient and insurance preference. Use up to four times daily as directed. (FOR ICD-10 E10.9, E11.9). 03/08/19   Arrien, Jimmy Picket, MD  insulin NPH-regular Human (NOVOLIN 70/30 RELION) (70-30) 100 UNIT/ML injection Inject 25 Units into the skin 2 (two) times daily with a meal. 03/08/19 04/07/19  Arrien, Jimmy Picket, MD  Insulin Syringe-Needle U-100 (RELION INSULIN SYR 1CC/30G) 30G X 5/16" 1 ML MISC 1 Syringe by  Does not apply route 2 (two) times a day. 03/08/19   Arrien, Jimmy Picket, MD    Family History Family History  Problem Relation Age of Onset  . Diabetes Mother   . COPD Mother   . Heart disease Mother     Social History Social History   Tobacco Use  . Smoking status: Current Every Day Smoker    Packs/day: 1.00    Years: 25.00    Pack years: 25.00    Types: Cigarettes  . Smokeless tobacco: Never Used  Substance Use Topics  . Alcohol use: Yes     Alcohol/week: 1.0 standard drinks    Types: 1 Cans of beer per week    Comment: one beer weekly  . Drug use: Yes    Types: Marijuana, Cocaine    Comment: THC daily, Cocaine daily     Allergies   Patient has no known allergies.   Review of Systems Review of Systems  Constitutional: Negative for fever.  HENT: Negative for hearing loss and sore throat.   Eyes: Negative for visual disturbance.  Respiratory: Negative for shortness of breath.   Cardiovascular: Negative for chest pain.  Gastrointestinal: Negative for abdominal pain, blood in stool, diarrhea, nausea and vomiting.  Genitourinary: Negative for dysuria.  Musculoskeletal: Negative for neck pain.  Skin: Negative for rash.  Neurological: Positive for dizziness and light-headedness. Negative for syncope, speech difficulty and headaches.     Physical Exam Updated Vital Signs BP 103/66   Pulse 84   Temp 98.7 F (37.1 C)   Resp 16   SpO2 99%   Physical Exam Vitals signs and nursing note reviewed.  Constitutional:      Appearance: He is well-developed.  HENT:     Head: Normocephalic and atraumatic.  Eyes:     Conjunctiva/sclera: Conjunctivae normal.  Neck:     Musculoskeletal: Neck supple.  Cardiovascular:     Rate and Rhythm: Normal rate and regular rhythm.     Heart sounds: No murmur.  Pulmonary:     Effort: Pulmonary effort is normal. No respiratory distress.     Breath sounds: Normal breath sounds.  Abdominal:     Palpations: Abdomen is soft.     Tenderness: There is no abdominal tenderness.  Musculoskeletal: Normal range of motion.     Right lower leg: No edema.     Left lower leg: No edema.  Skin:    General: Skin is warm and dry.     Capillary Refill: Capillary refill takes less than 2 seconds.  Neurological:     General: No focal deficit present.     Mental Status: He is alert.     Cranial Nerves: No cranial nerve deficit.     Sensory: No sensory deficit.     Motor: No weakness.      ED  Treatments / Results  Labs (all labs ordered are listed, but only abnormal results are displayed) Labs Reviewed  BASIC METABOLIC PANEL - Abnormal; Notable for the following components:      Result Value   Glucose, Bld 126 (*)    BUN 32 (*)    Creatinine, Ser 1.65 (*)    Calcium 8.6 (*)    GFR calc non Af Amer 48 (*)    GFR calc Af Amer 56 (*)    All other components within normal limits  CBC - Abnormal; Notable for the following components:   Hemoglobin 12.6 (*)    HCT 37.6 (*)    All other  components within normal limits  URINALYSIS, ROUTINE W REFLEX MICROSCOPIC - Abnormal; Notable for the following components:   Glucose, UA 50 (*)    Ketones, ur 5 (*)    Protein, ur 30 (*)    All other components within normal limits  CBG MONITORING, ED - Abnormal; Notable for the following components:   Glucose-Capillary 120 (*)    All other components within normal limits    EKG EKG Interpretation  Date/Time:  Saturday August 06 2019 10:04:09 EST Ventricular Rate:  80 PR Interval:  144 QRS Duration: 96 QT Interval:  398 QTC Calculation: 459 R Axis:   76 Text Interpretation: Normal sinus rhythm Incomplete right bundle branch block Moderate voltage criteria for LVH, may be normal variant ( Sokolow-Lyon , Cornell product ) Borderline ECG compared with prior 7/20, peaked t waves improved Confirmed by Aletta Edouard [phone]) on 08/06/2019 11:29:14 AM   Radiology No results found.  Procedures Procedures (including critical care time)  Medications Ordered in ED Medications  sodium chloride flush (NS) 0.9 % injection 3 mL (has no administration in time range)  sodium chloride 0.9 % bolus 1,000 mL (0 mLs Intravenous Stopped 08/06/19 1553)     Initial Impression / Assessment and Plan / ED Course  I have reviewed the triage vital signs and the nursing notes.  Pertinent labs & imaging results that were available during my care of the patient were reviewed by me and considered in my  medical decision making (see chart for details).  Clinical Course as of Aug 05 1709  Sat Aug 06, 5747  7149 48 year old male diabetic with history of substance use here with 2 days of feeling lightheaded like he might pass out.  Blood pressure low here.  Nonfocal neuro exam.  Differential diagnosis includes dehydration, arrhythmia, hypovolemia, infection, hypoglycemia.    [MB]  1310 Patient's creatinine elevated but he has been higher in the past.  Hemoglobin slightly lower than baseline but not critical.   [MB]  1456 Reevaluated patient.  He was sleeping but he said he felt a little better when I woke him up.  Blood pressure bouncing a little up and down but still on the soft side.  Otherwise nontoxic-appearing.  Received IV fluids.   [MB]    Clinical Course User Index [MB] Hayden Rasmussen, MD        Final Clinical Impressions(s) / ED Diagnoses   Final diagnoses:  Lightheadedness  Dehydration    ED Discharge Orders    None       Hayden Rasmussen, MD 08/06/19 1710

## 2019-08-06 NOTE — ED Triage Notes (Addendum)
Pt arrives via [facility] for c/o dizziness that began yesterday around 11am ems reports pt was at a friends house when he began to feel dizzy, pt was assisted to the floor by friends, a/ox4. Pt received 500 cc ns via 18g iv that ems placed pta. Speech clear, face symmetrical, grip strengths equal, no arm drift. Pt also reports hx of PE, no cp or sob, not currently on thinners.   110/70, 80 NSR, 100% ra, rr20, CBG 156 hx of diabetes

## 2019-08-06 NOTE — Discharge Instructions (Signed)
You were seen in the emergency department for feeling lightheaded like he might faint.  You had blood work EKG

## 2019-08-21 ENCOUNTER — Encounter (HOSPITAL_COMMUNITY): Payer: Self-pay

## 2019-08-21 ENCOUNTER — Other Ambulatory Visit: Payer: Self-pay

## 2019-08-21 ENCOUNTER — Inpatient Hospital Stay (HOSPITAL_COMMUNITY)
Admission: EM | Admit: 2019-08-21 | Discharge: 2019-08-24 | DRG: 638 | Disposition: A | Discharge status: Home / Self Care | Payer: Self-pay | Attending: Family Medicine | Admitting: Family Medicine

## 2019-08-21 DIAGNOSIS — F17210 Nicotine dependence, cigarettes, uncomplicated: Secondary | ICD-10-CM | POA: Diagnosis present

## 2019-08-21 DIAGNOSIS — F1910 Other psychoactive substance abuse, uncomplicated: Secondary | ICD-10-CM | POA: Diagnosis present

## 2019-08-21 DIAGNOSIS — Z9114 Patient's other noncompliance with medication regimen: Secondary | ICD-10-CM

## 2019-08-21 DIAGNOSIS — N179 Acute kidney failure, unspecified: Secondary | ICD-10-CM | POA: Diagnosis present

## 2019-08-21 DIAGNOSIS — Z91199 Patient's noncompliance with other medical treatment and regimen due to unspecified reason: Secondary | ICD-10-CM

## 2019-08-21 DIAGNOSIS — E1010 Type 1 diabetes mellitus with ketoacidosis without coma: Secondary | ICD-10-CM

## 2019-08-21 DIAGNOSIS — F319 Bipolar disorder, unspecified: Secondary | ICD-10-CM | POA: Diagnosis present

## 2019-08-21 DIAGNOSIS — Z825 Family history of asthma and other chronic lower respiratory diseases: Secondary | ICD-10-CM

## 2019-08-21 DIAGNOSIS — E1122 Type 2 diabetes mellitus with diabetic chronic kidney disease: Secondary | ICD-10-CM | POA: Diagnosis present

## 2019-08-21 DIAGNOSIS — Z9119 Patient's noncompliance with other medical treatment and regimen: Secondary | ICD-10-CM

## 2019-08-21 DIAGNOSIS — Z8249 Family history of ischemic heart disease and other diseases of the circulatory system: Secondary | ICD-10-CM

## 2019-08-21 DIAGNOSIS — Z794 Long term (current) use of insulin: Secondary | ICD-10-CM

## 2019-08-21 DIAGNOSIS — Z833 Family history of diabetes mellitus: Secondary | ICD-10-CM

## 2019-08-21 DIAGNOSIS — E1110 Type 2 diabetes mellitus with ketoacidosis without coma: Principal | ICD-10-CM | POA: Diagnosis present

## 2019-08-21 DIAGNOSIS — E875 Hyperkalemia: Secondary | ICD-10-CM | POA: Diagnosis present

## 2019-08-21 DIAGNOSIS — R52 Pain, unspecified: Secondary | ICD-10-CM

## 2019-08-21 DIAGNOSIS — F1410 Cocaine abuse, uncomplicated: Secondary | ICD-10-CM | POA: Diagnosis present

## 2019-08-21 DIAGNOSIS — Z20828 Contact with and (suspected) exposure to other viral communicable diseases: Secondary | ICD-10-CM | POA: Diagnosis present

## 2019-08-21 LAB — CBC WITH DIFFERENTIAL/PLATELET
Abs Immature Granulocytes: 0.04 10*3/uL (ref 0.00–0.07)
Basophils Absolute: 0 10*3/uL (ref 0.0–0.1)
Basophils Relative: 1 %
Eosinophils Absolute: 0 10*3/uL (ref 0.0–0.5)
Eosinophils Relative: 0 %
HCT: 43 % (ref 39.0–52.0)
Hemoglobin: 13.6 g/dL (ref 13.0–17.0)
Immature Granulocytes: 1 %
Lymphocytes Relative: 15 %
Lymphs Abs: 0.9 10*3/uL (ref 0.7–4.0)
MCH: 28.6 pg (ref 26.0–34.0)
MCHC: 31.6 g/dL (ref 30.0–36.0)
MCV: 90.3 fL (ref 80.0–100.0)
Monocytes Absolute: 0.3 10*3/uL (ref 0.1–1.0)
Monocytes Relative: 5 %
Neutro Abs: 4.5 10*3/uL (ref 1.7–7.7)
Neutrophils Relative %: 78 %
Platelets: 301 10*3/uL (ref 150–400)
RBC: 4.76 MIL/uL (ref 4.22–5.81)
RDW: 15.2 % (ref 11.5–15.5)
WBC: 5.7 10*3/uL (ref 4.0–10.5)
nRBC: 0 % (ref 0.0–0.2)

## 2019-08-21 LAB — URINALYSIS, ROUTINE W REFLEX MICROSCOPIC
Bacteria, UA: NONE SEEN
Bilirubin Urine: NEGATIVE
Glucose, UA: >=500 mg/dL — AB
Hgb urine dipstick: NEGATIVE
Ketones, ur: 80 mg/dL — AB
Leukocytes,Ua: NEGATIVE
Nitrite: NEGATIVE
Protein, ur: NEGATIVE mg/dL
Specific Gravity, Urine: 1.022 (ref 1.005–1.030)
pH: 5 (ref 5.0–8.0)

## 2019-08-21 LAB — CBG MONITORING, ED
Glucose-Capillary: 500 mg/dL — ABNORMAL HIGH (ref 70–99)
Glucose-Capillary: 546 mg/dL (ref 70–99)

## 2019-08-21 LAB — BASIC METABOLIC PANEL
Anion gap: 25 — ABNORMAL HIGH (ref 5–15)
BUN: 28 mg/dL — ABNORMAL HIGH (ref 6–20)
CO2: 11 mmol/L — ABNORMAL LOW (ref 22–32)
Calcium: 8.4 mg/dL — ABNORMAL LOW (ref 8.9–10.3)
Chloride: 99 mmol/L (ref 98–111)
Creatinine, Ser: 1.86 mg/dL — ABNORMAL HIGH (ref 0.61–1.24)
GFR calc Af Amer: 48 mL/min — ABNORMAL LOW (ref >60)
GFR calc non Af Amer: 42 mL/min — ABNORMAL LOW (ref >60)
Glucose, Bld: 586 mg/dL (ref 70–99)
Potassium: 5.4 mmol/L — ABNORMAL HIGH (ref 3.5–5.1)
Sodium: 135 mmol/L (ref 135–145)

## 2019-08-21 LAB — BLOOD GAS, VENOUS
Acid-base deficit: 14.6 mmol/L — ABNORMAL HIGH (ref 0.0–2.0)
Bicarbonate: 12.2 mmol/L — ABNORMAL LOW (ref 20.0–28.0)
O2 Saturation: 56.7 %
Patient temperature: 98.6
pCO2, Ven: 32 mmHg — ABNORMAL LOW (ref 44.0–60.0)
pH, Ven: 7.205 — ABNORMAL LOW (ref 7.250–7.430)
pO2, Ven: 36.3 mmHg (ref 32.0–45.0)

## 2019-08-21 LAB — RAPID URINE DRUG SCREEN, HOSP PERFORMED
Amphetamines: NOT DETECTED
Barbiturates: NOT DETECTED
Benzodiazepines: NOT DETECTED
Cocaine: POSITIVE — AB
Opiates: NOT DETECTED
Tetrahydrocannabinol: NOT DETECTED

## 2019-08-21 MED ORDER — DEXTROSE-NACL 5-0.45 % IV SOLN: INTRAVENOUS | Status: DC

## 2019-08-21 MED ORDER — SODIUM CHLORIDE 0.9 % IV BOLUS
1000.0000 mL | INTRAVENOUS | Status: DC
  Administered 2019-08-22: 01:00:00 1000 mL via INTRAVENOUS

## 2019-08-21 MED ORDER — SODIUM CHLORIDE 0.9 % IV BOLUS
1000.0000 mL | Freq: Once | INTRAVENOUS | Status: AC
  Administered 2019-08-21: 1000 mL via INTRAVENOUS

## 2019-08-21 MED ORDER — SODIUM CHLORIDE 0.9 % IV SOLN: INTRAVENOUS | Status: DC

## 2019-08-21 MED ORDER — INSULIN REGULAR(HUMAN) IN NACL 100-0.9 UT/100ML-% IV SOLN
INTRAVENOUS | Status: DC
  Administered 2019-08-22: 01:00:00 10 [IU]/h via INTRAVENOUS
  Filled 2019-08-21: qty 100

## 2019-08-21 MED ORDER — DEXTROSE 50 % IV SOLN: 0.0000 mL | INTRAVENOUS | Status: DC | PRN

## 2019-08-21 NOTE — ED Provider Notes (Signed)
[facility] DEPT Provider Note   CSN: [id number] Arrival date & time: 08/21/19  2131     History No chief complaint on file.   John Cox is a 48 y.o. male.  Patient to ED by EMS with concern for high blood sugar. He has run out of his insulin, last used one week ago. Per EMS, patient alert and oriented with them, however, patient refusing to participate in answering questions on HPI. Per EMS, he reported vomiting at home and was given Zofran in route. No vomiting in ED. Per chart review, the patient has a history of T2IDDM, polysubstance abuse, bipolar. He has been seen multiple times for sugar imbalances, last instance was 08/06/19.  The history is provided by the patient, the EMS personnel and medical records. No language interpreter was used.       Past Medical History:  Diagnosis Date  . AKI (acute kidney injury) ([place]) 01/25/2018  . Anxiety   . Bipolar disorder ([place])   . Depression 2000  . Diabetes mellitus 1995  . Hyperglycemia 01/25/2018  . Nausea & vomiting   . Polysubstance abuse ([place]) 2012   relapse 2 wk ago   . Suicidal ideation     Patient Active Problem List   Diagnosis Date Noted  . Hyperkalemia 03/06/2019  . Hyperbilirubinemia 03/06/2019  . Overdose 06/26/2018  . MDD (major depressive disorder), severe ([place]) 05/27/2018  . Tachy-brady syndrome ([place]) 05/26/2018  . Hypoglycemia 05/24/2018  . DKA, type 1 ([place]) 03/31/2018  . Hyperglycemia due to type 1 diabetes mellitus ([place]) 02/23/2018  . 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 Malnutrition of moderate degree 07/19/2015  . DKA (diabetic ketoacidoses) ([place]) 07/18/2015  . AKI (acute kidney injury) ([place]) 07/18/2015  . Marijuana abuse 07/18/2015  . Dehydration 07/18/2015  . Dental caries 07/18/2015  . Hyperlipidemia associated with type 2 diabetes mellitus ([place])  05/24/2014  . Polysubstance abuse ([place]) 05/13/2014  . Major depression, recurrent ([place]) 05/13/2014  . Suicidal ideation 05/13/2014  . ANTICARDIOLIPIN ANTIBODY SYNDROME 05/12/2007  . Type 1 diabetes mellitus ([place]) 04/26/2007  . TOBACCO ABUSE 04/26/2007  . 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 NO PAST SURGERIES         Family History  Problem Relation Age of Onset  . Diabetes Mother   . COPD Mother   . Heart disease Mother     Social History   Tobacco Use  . Smoking status: Current Every Day Smoker    Packs/day: 1.00    Years: 25.00    Pack years: 25.00    Types: Cigarettes  . Smokeless tobacco: Never Used  Substance Use Topics  . Alcohol use: Yes    Alcohol/week: 1.0 standard drinks    Types: 1 Cans of beer per week    Comment: one beer weekly  . Drug use: Yes    Types: Marijuana, Cocaine    Comment: THC daily, Cocaine daily    Home Medications Prior to Admission medications   Medication Sig Start Date End Date Taking? Authorizing Provider  blood glucose meter kit and supplies Dispense based on patient and insurance preference. Use up to four times daily as directed. (FOR ICD-10 E10.9, E11.9). 03/08/19   Arrien, Jimmy Picket, MD  insulin NPH-regular Human (NOVOLIN 70/30 RELION) (70-30) 100 UNIT/ML injection Inject 25 Units into the skin 2 (two) times daily  with a meal. 03/08/19 08/06/19  Arrien, Jimmy Picket, MD  Insulin Syringe-Needle U-100 (RELION INSULIN SYR 1CC/30G) 30G X 5/16" 1 ML MISC 1 Syringe by Does not apply route 2 (two) times a day. 03/08/19   Arrien, Jimmy Picket, MD    Allergies    Patient has no known allergies.  Review of Systems   Review of Systems  Unable to perform ROS: Patient nonverbal    Physical Exam Updated Vital Signs BP 127/60   Pulse 76   Resp 20   SpO2 100%   Physical Exam Vitals and nursing note reviewed.  Constitutional:      General: He is not in acute distress.     Appearance: He is not ill-appearing, toxic-appearing or diaphoretic.  HENT:     Head: Normocephalic and atraumatic.  Cardiovascular:     Rate and Rhythm: Normal rate and regular rhythm.     Heart sounds: No murmur.  Pulmonary:     Effort: No respiratory distress.     Breath sounds: No wheezing, rhonchi or rales.  Abdominal:     General: There is no distension.     Tenderness: There is no abdominal tenderness.  Musculoskeletal:     Cervical back: Normal range of motion.  Skin:    General: Skin is warm and dry.  Neurological:     Comments: Awake, understands command (opens eye when asked). Does not participate in exam otherwise. Reflexes normal.      ED Results / Procedures / Treatments   Labs (all labs ordered are listed, but only abnormal results are displayed) Labs Reviewed  CBG MONITORING, ED - Abnormal; Notable for the following components:      Result Value   Glucose-Capillary 546 (*)    All other components within normal limits  CBC WITH DIFFERENTIAL/PLATELET  BASIC METABOLIC PANEL  URINALYSIS, ROUTINE W REFLEX MICROSCOPIC  BLOOD GAS, VENOUS  RAPID URINE DRUG SCREEN, HOSP PERFORMED   Results for orders placed or performed during the hospital encounter of 08/21/19  CBC with Differential  Result Value Ref Range   WBC 5.7 4.0 - 10.5 K/uL   RBC 4.76 4.22 - 5.81 MIL/uL   Hemoglobin 13.6 13.0 - 17.0 g/dL   HCT 43.0 39.0 - 52.0 %   MCV 90.3 80.0 - 100.0 fL   MCH 28.6 26.0 - 34.0 pg   MCHC 31.6 30.0 - 36.0 g/dL   RDW 15.2 11.5 - 15.5 %   Platelets 301 150 - 400 K/uL   nRBC 0.0 0.0 - 0.2 %   Neutrophils Relative % 78 %   Neutro Abs 4.5 1.7 - 7.7 K/uL   Lymphocytes Relative 15 %   Lymphs Abs 0.9 0.7 - 4.0 K/uL   Monocytes Relative 5 %   Monocytes Absolute 0.3 0.1 - 1.0 K/uL   Eosinophils Relative 0 %   Eosinophils Absolute 0.0 0.0 - 0.5 K/uL   Basophils Relative 1 %   Basophils Absolute 0.0 0.0 - 0.1 K/uL   Immature Granulocytes 1 %   Abs Immature Granulocytes  0.04 0.00 - 0.07 K/uL  BMET  Result Value Ref Range   Sodium 135 135 - 145 mmol/L   Potassium 5.4 (H) 3.5 - 5.1 mmol/L   Chloride 99 98 - 111 mmol/L   CO2 11 (L) 22 - 32 mmol/L   Glucose, Bld 586 (HH) 70 - 99 mg/dL   BUN 28 (H) 6 - 20 mg/dL   Creatinine, Ser 1.86 (H) 0.61 - 1.24 mg/dL   Calcium  8.4 (L) 8.9 - 10.3 mg/dL   GFR calc non Af Amer 42 (L) >60 mL/min   GFR calc Af Amer 48 (L) >60 mL/min   Anion gap 25 (H) 5 - 15  UA  Result Value Ref Range   Color, Urine STRAW (A) YELLOW   APPearance CLEAR CLEAR   Specific Gravity, Urine 1.022 1.005 - 1.030   pH 5.0 5.0 - 8.0   Glucose, UA >=500 (A) NEGATIVE mg/dL   Hgb urine dipstick NEGATIVE NEGATIVE   Bilirubin Urine NEGATIVE NEGATIVE   Ketones, ur 80 (A) NEGATIVE mg/dL   Protein, ur NEGATIVE NEGATIVE mg/dL   Nitrite NEGATIVE NEGATIVE   Leukocytes,Ua NEGATIVE NEGATIVE   Bacteria, UA NONE SEEN NONE SEEN   Mucus PRESENT   VBG  Result Value Ref Range   pH, Ven 7.205 (L) 7.250 - 7.430   pCO2, Ven 32.0 (L) 44.0 - 60.0 mmHg   pO2, Ven 36.3 32.0 - 45.0 mmHg   Bicarbonate 12.2 (L) 20.0 - 28.0 mmol/L   Acid-base deficit 14.6 (H) 0.0 - 2.0 mmol/L   O2 Saturation 56.7 %   Patient temperature 98.6   Rapid urine drug screen (hospital performed)  Result Value Ref Range   Opiates NONE DETECTED NONE DETECTED   Cocaine POSITIVE (A) NONE DETECTED   Benzodiazepines NONE DETECTED NONE DETECTED   Amphetamines NONE DETECTED NONE DETECTED   Tetrahydrocannabinol NONE DETECTED NONE DETECTED   Barbiturates NONE DETECTED NONE DETECTED  Beta-hydroxybutyric acid  Result Value Ref Range   Beta-Hydroxybutyric Acid >8.00 (H) 0.05 - 0.27 mmol/L  CBG monitoring, ED  Result Value Ref Range   Glucose-Capillary 546 (HH) 70 - 99 mg/dL  CBG monitoring, ED  Result Value Ref Range   Glucose-Capillary 500 (H) 70 - 99 mg/dL  CBG monitoring, ED  Result Value Ref Range   Glucose-Capillary 557 (HH) 70 - 99 mg/dL   Comment 1 Notify RN   CBG monitoring, ED    Result Value Ref Range   Glucose-Capillary 480 (H) 70 - 99 mg/dL  CBG monitoring, ED  Result Value Ref Range   Glucose-Capillary 443 (H) 70 - 99 mg/dL    EKG None  Radiology No results found.  Procedures Procedures (including critical care time) CRITICAL CARE Performed by: Dewaine Oats   Total critical care time: 50 minutes  Critical care time was exclusive of separately billable procedures and treating other patients.  Critical care was necessary to treat or prevent imminent or life-threatening deterioration.  Critical care was time spent personally by me on the following activities: development of treatment plan with patient and/or surrogate as well as nursing, discussions with consultants, evaluation of patient's response to treatment, examination of patient, obtaining history from patient or surrogate, ordering and performing treatments and interventions, ordering and review of laboratory studies, ordering and review of radiographic studies, pulse oximetry and re-evaluation of patient's condition.  Medications Ordered in ED Medications  0.9 %  sodium chloride infusion (has no administration in time range)  sodium chloride 0.9 % bolus 1,000 mL (1,000 mLs Intravenous New Bag/Given (Non-Interop) 08/21/19 2231)    ED Course  I have reviewed the triage vital signs and the nursing notes.  Pertinent labs & imaging results that were available during my care of the patient were reviewed by me and considered in my medical decision making (see chart for details).  Patient to ED with nausea and vomiting, generally feeling unwell, no insulin x 1 week.   Patient with history of noncompliance with  medications, DKA, cocaine dependence. With nausea and hyperglycemia, 435 by EMS, it appears likely he is in DKA again. Labs pending. IV established. Will start fluid bolus.  Patient more willing to answer questions on re-eval. VBG supports acidosis. BMET pending. No vomiting. VSS.  Patient initially reported as Type 2 DM, however, on chart review he appears to be Type 1.  BMET with CO2 11, anion gap of 25. He is started on hyperglycemic crisis protocol insulin. Fluid bolus increased. Will call for admission.     MDM Rules/Calculators/A&P                      Patient to  Final Clinical Impression(s) / ED Diagnoses Final diagnoses:  None   1. DKA 2. Noncompliance with medications 3. Cocaine abuse  Rx / DC Orders ED Discharge Orders    None       Dennie Bible 08/22/19 0238    Lacretia Leigh, MD 08/23/19 [phone]

## 2019-08-21 NOTE — ED Triage Notes (Addendum)
BIB EMS. Pt states he has not has his insulin since last week. Pt reports N/V and feeling unwell for the past few days and not felt like eating. PT reports falling yesterday and hitting right wrist and complains of wrist pain. Pt A/O hx of diabetes CBG 435 with EMS.  500cc fluids 4mg  of Zofran  with EMS.

## 2019-08-22 ENCOUNTER — Inpatient Hospital Stay (HOSPITAL_COMMUNITY): Payer: Self-pay

## 2019-08-22 ENCOUNTER — Encounter (HOSPITAL_COMMUNITY): Payer: Self-pay | Admitting: Internal Medicine

## 2019-08-22 DIAGNOSIS — E1010 Type 1 diabetes mellitus with ketoacidosis without coma: Secondary | ICD-10-CM

## 2019-08-22 DIAGNOSIS — F1910 Other psychoactive substance abuse, uncomplicated: Secondary | ICD-10-CM

## 2019-08-22 DIAGNOSIS — N179 Acute kidney failure, unspecified: Secondary | ICD-10-CM | POA: Diagnosis present

## 2019-08-22 LAB — SARS CORONAVIRUS 2 (TAT 6-24 HRS): SARS Coronavirus 2: NEGATIVE

## 2019-08-22 LAB — BASIC METABOLIC PANEL
Anion gap: 23 — ABNORMAL HIGH (ref 5–15)
Anion gap: 16 — ABNORMAL HIGH (ref 5–15)
Anion gap: 15 (ref 5–15)
Anion gap: 17 — ABNORMAL HIGH (ref 5–15)
Anion gap: 26 — ABNORMAL HIGH (ref 5–15)
BUN: 28 mg/dL — ABNORMAL HIGH (ref 6–20)
BUN: 26 mg/dL — ABNORMAL HIGH (ref 6–20)
BUN: 23 mg/dL — ABNORMAL HIGH (ref 6–20)
BUN: 20 mg/dL (ref 6–20)
BUN: 22 mg/dL — ABNORMAL HIGH (ref 6–20)
CO2: 10 mmol/L — ABNORMAL LOW (ref 22–32)
CO2: 14 mmol/L — ABNORMAL LOW (ref 22–32)
CO2: 15 mmol/L — ABNORMAL LOW (ref 22–32)
CO2: 16 mmol/L — ABNORMAL LOW (ref 22–32)
CO2: 7 mmol/L — ABNORMAL LOW (ref 22–32)
Calcium: 8 mg/dL — ABNORMAL LOW (ref 8.9–10.3)
Calcium: 8.1 mg/dL — ABNORMAL LOW (ref 8.9–10.3)
Calcium: 8 mg/dL — ABNORMAL LOW (ref 8.9–10.3)
Calcium: 8.1 mg/dL — ABNORMAL LOW (ref 8.9–10.3)
Calcium: 8.3 mg/dL — ABNORMAL LOW (ref 8.9–10.3)
Chloride: 106 mmol/L (ref 98–111)
Chloride: 112 mmol/L — ABNORMAL HIGH (ref 98–111)
Chloride: 112 mmol/L — ABNORMAL HIGH (ref 98–111)
Chloride: 109 mmol/L (ref 98–111)
Chloride: 106 mmol/L (ref 98–111)
Creatinine, Ser: 1.89 mg/dL — ABNORMAL HIGH (ref 0.61–1.24)
Creatinine, Ser: 1.76 mg/dL — ABNORMAL HIGH (ref 0.61–1.24)
Creatinine, Ser: 1.58 mg/dL — ABNORMAL HIGH (ref 0.61–1.24)
Creatinine, Ser: 1.55 mg/dL — ABNORMAL HIGH (ref 0.61–1.24)
Creatinine, Ser: 1.76 mg/dL — ABNORMAL HIGH (ref 0.61–1.24)
GFR calc Af Amer: 47 mL/min — ABNORMAL LOW (ref >60)
GFR calc Af Amer: 51 mL/min — ABNORMAL LOW (ref >60)
GFR calc Af Amer: 59 mL/min — ABNORMAL LOW (ref >60)
GFR calc Af Amer: >60 mL/min (ref >60)
GFR calc Af Amer: 51 mL/min — ABNORMAL LOW (ref >60)
GFR calc non Af Amer: 41 mL/min — ABNORMAL LOW (ref >60)
GFR calc non Af Amer: 44 mL/min — ABNORMAL LOW (ref >60)
GFR calc non Af Amer: 51 mL/min — ABNORMAL LOW (ref >60)
GFR calc non Af Amer: 52 mL/min — ABNORMAL LOW (ref >60)
GFR calc non Af Amer: 44 mL/min — ABNORMAL LOW (ref >60)
Glucose, Bld: 450 mg/dL — ABNORMAL HIGH (ref 70–99)
Glucose, Bld: 208 mg/dL — ABNORMAL HIGH (ref 70–99)
Glucose, Bld: 155 mg/dL — ABNORMAL HIGH (ref 70–99)
Glucose, Bld: 213 mg/dL — ABNORMAL HIGH (ref 70–99)
Glucose, Bld: 540 mg/dL (ref 70–99)
Potassium: 5.2 mmol/L — ABNORMAL HIGH (ref 3.5–5.1)
Potassium: 4.6 mmol/L (ref 3.5–5.1)
Potassium: 4.9 mmol/L (ref 3.5–5.1)
Potassium: 5.2 mmol/L — ABNORMAL HIGH (ref 3.5–5.1)
Potassium: 5.5 mmol/L — ABNORMAL HIGH (ref 3.5–5.1)
Sodium: 139 mmol/L (ref 135–145)
Sodium: 142 mmol/L (ref 135–145)
Sodium: 142 mmol/L (ref 135–145)
Sodium: 142 mmol/L (ref 135–145)
Sodium: 139 mmol/L (ref 135–145)

## 2019-08-22 LAB — CBC
HCT: 41 % (ref 39.0–52.0)
Hemoglobin: 12.9 g/dL — ABNORMAL LOW (ref 13.0–17.0)
MCH: 27.8 pg (ref 26.0–34.0)
MCHC: 31.5 g/dL (ref 30.0–36.0)
MCV: 88.4 fL (ref 80.0–100.0)
Platelets: 293 10*3/uL (ref 150–400)
RBC: 4.64 MIL/uL (ref 4.22–5.81)
RDW: 15.3 % (ref 11.5–15.5)
WBC: 6.9 10*3/uL (ref 4.0–10.5)
nRBC: 0 % (ref 0.0–0.2)

## 2019-08-22 LAB — BETA-HYDROXYBUTYRIC ACID
Beta-Hydroxybutyric Acid: >8 mmol/L — ABNORMAL HIGH (ref 0.05–0.27)
Beta-Hydroxybutyric Acid: 6.79 mmol/L — ABNORMAL HIGH (ref 0.05–0.27)
Beta-Hydroxybutyric Acid: 7.98 mmol/L — ABNORMAL HIGH (ref 0.05–0.27)

## 2019-08-22 LAB — CBG MONITORING, ED
Glucose-Capillary: 123 mg/dL — ABNORMAL HIGH (ref 70–99)
Glucose-Capillary: 137 mg/dL — ABNORMAL HIGH (ref 70–99)
Glucose-Capillary: 139 mg/dL — ABNORMAL HIGH (ref 70–99)
Glucose-Capillary: 140 mg/dL — ABNORMAL HIGH (ref 70–99)
Glucose-Capillary: 179 mg/dL — ABNORMAL HIGH (ref 70–99)
Glucose-Capillary: 206 mg/dL — ABNORMAL HIGH (ref 70–99)
Glucose-Capillary: 230 mg/dL — ABNORMAL HIGH (ref 70–99)
Glucose-Capillary: 258 mg/dL — ABNORMAL HIGH (ref 70–99)
Glucose-Capillary: 269 mg/dL — ABNORMAL HIGH (ref 70–99)
Glucose-Capillary: 275 mg/dL — ABNORMAL HIGH (ref 70–99)
Glucose-Capillary: 443 mg/dL — ABNORMAL HIGH (ref 70–99)
Glucose-Capillary: 476 mg/dL — ABNORMAL HIGH (ref 70–99)
Glucose-Capillary: 480 mg/dL — ABNORMAL HIGH (ref 70–99)
Glucose-Capillary: 513 mg/dL (ref 70–99)
Glucose-Capillary: 557 mg/dL (ref 70–99)

## 2019-08-22 LAB — HEPATIC FUNCTION PANEL
ALT: 40 U/L (ref 0–44)
AST: 31 U/L (ref 15–41)
Albumin: 3.1 g/dL — ABNORMAL LOW (ref 3.5–5.0)
Alkaline Phosphatase: 59 U/L (ref 38–126)
Bilirubin, Direct: 0.1 mg/dL (ref 0.0–0.2)
Indirect Bilirubin: 1.5 mg/dL — ABNORMAL HIGH (ref 0.3–0.9)
Total Bilirubin: 1.6 mg/dL — ABNORMAL HIGH (ref 0.3–1.2)
Total Protein: 5.5 g/dL — ABNORMAL LOW (ref 6.5–8.1)

## 2019-08-22 LAB — TROPONIN I (HIGH SENSITIVITY)
Troponin I (High Sensitivity): 6 ng/L (ref <18)
Troponin I (High Sensitivity): 5 ng/L (ref <18)

## 2019-08-22 LAB — HEMOGLOBIN A1C
Hgb A1c MFr Bld: 12.2 % — ABNORMAL HIGH (ref 4.8–5.6)
Mean Plasma Glucose: 303.44 mg/dL

## 2019-08-22 LAB — LIPASE, BLOOD: Lipase: 27 U/L (ref 11–51)

## 2019-08-22 LAB — CK: Total CK: 135 U/L (ref 49–397)

## 2019-08-22 MED ORDER — INSULIN REGULAR(HUMAN) IN NACL 100-0.9 UT/100ML-% IV SOLN: INTRAVENOUS | Status: DC

## 2019-08-22 MED ORDER — DEXTROSE-NACL 5-0.45 % IV SOLN: INTRAVENOUS | Status: DC

## 2019-08-22 MED ORDER — DEXTROSE 50 % IV SOLN: 0.0000 mL | INTRAVENOUS | Status: DC | PRN

## 2019-08-22 MED ORDER — ENOXAPARIN SODIUM 40 MG/0.4ML ~~LOC~~ SOLN
40.0000 mg | SUBCUTANEOUS | Status: DC
  Administered 2019-08-22 – 2019-08-24 (×3): 40 mg via SUBCUTANEOUS
  Filled 2019-08-22 (×2): qty 0.4

## 2019-08-22 MED ORDER — SODIUM CHLORIDE 0.9 % IV SOLN: INTRAVENOUS | Status: DC

## 2019-08-22 MED ORDER — ONDANSETRON HCL 4 MG/2ML IJ SOLN
4.0000 mg | Freq: Once | INTRAMUSCULAR | Status: AC
  Administered 2019-08-22: 01:00:00 4 mg via INTRAVENOUS
  Filled 2019-08-22: qty 2

## 2019-08-22 MED ORDER — INSULIN REGULAR(HUMAN) IN NACL 100-0.9 UT/100ML-% IV SOLN
INTRAVENOUS | Status: DC
  Administered 2019-08-22: 20:00:00 10 [IU]/h via INTRAVENOUS
  Administered 2019-08-23: 05:00:00 0.4 [IU]/h via INTRAVENOUS
  Filled 2019-08-22 (×2): qty 100

## 2019-08-22 MED ORDER — SODIUM CHLORIDE 0.9 % IV BOLUS: 20.0000 mL/kg | Freq: Once | INTRAVENOUS | Status: AC

## 2019-08-22 MED ORDER — INSULIN NPH (HUMAN) (ISOPHANE) 100 UNIT/ML ~~LOC~~ SUSP
20.0000 [IU] | Freq: Two times a day (BID) | SUBCUTANEOUS | Status: DC
  Filled 2019-08-22: qty 10

## 2019-08-22 NOTE — ED Notes (Signed)
Dr. Maudie Mercury aware patients BMP shows glucose to be 540.

## 2019-08-22 NOTE — H&P (Signed)
History and Physical    AUBURN HERT SLP:[id number] DOB: [birth year] DOA: 08/21/2019  PCP: Patient, No Pcp Per  Patient coming from: Home.  Chief Complaint: Nausea vomiting.  HPI: John Cox is a 48 y.o. male with history of diabetes mellitus type 2, chronic kidney disease and polysubstance abuse presents to the ER after patient has been having intractable nausea vomiting over the last 2 days and feeling weak and had a fall and hurt his right wrist.  Denies hitting his head or losing consciousness.  Has been having some epigastric pain denies chest pain or shortness of breath.  ED Course: In the ER patient's labs show blood sugar was 586 bicarb 11 anion gap was 25 creatinine 1.8 increased from 1.6 urine showing ketones.  Urine drug screen was positive for cocaine.  Covid test is pending.  Patient was given fluid bolus and started on IV insulin infusion for DKA.  Review of Systems: As per HPI, rest all negative.   Past Medical History:  Diagnosis Date  . AKI (acute kidney injury) ([place]) 01/25/2018  . Anxiety   . Bipolar disorder ([place])   . Depression 2000  . Diabetes mellitus 1995  . Hyperglycemia 01/25/2018  . Nausea & vomiting   . Polysubstance abuse ([place]) 2012   relapse 2 wk ago   . Suicidal ideation     Past Surgical History:  Procedure Laterality Date  . NO PAST SURGERIES       reports that he has been smoking cigarettes. He has a 25.00 pack-year smoking history. He has never used smokeless tobacco. He reports current alcohol use of about 1.0 standard drinks of alcohol per week. He reports current drug use. Drugs: Marijuana and Cocaine.  No Known Allergies  Family History  Problem Relation Age of Onset  . Diabetes Mother   . COPD Mother   . Heart disease Mother     Prior to Admission medications   Medication Sig Start Date End Date Taking? Authorizing Provider  blood glucose meter kit and supplies Dispense based on patient and insurance preference. Use up  to four times daily as directed. (FOR ICD-10 E10.9, E11.9). 03/08/19  Yes Arrien, Jimmy Picket, MD  insulin NPH-regular Human (NOVOLIN 70/30 RELION) (70-30) 100 UNIT/ML injection Inject 25 Units into the skin 2 (two) times daily with a meal. 03/08/19 08/22/19 Yes Arrien, Jimmy Picket, MD  Insulin Syringe-Needle U-100 (RELION INSULIN SYR 1CC/30G) 30G X 5/16" 1 ML MISC 1 Syringe by Does not apply route 2 (two) times a day. 03/08/19  Yes Arrien, Jimmy Picket, MD    Physical Exam: Constitutional: Moderately built and nourished. Vitals:   08/21/19 2345 08/22/19 0049 08/22/19 0134 08/22/19 0249  BP: 140/75 134/62 132/63 112/67  Pulse: 91 91 90 96  Resp: (!) 23 (!) 22 20 17   Temp:      TempSrc:      SpO2: 100% 100% 100% 99%   Eyes: Anicteric no pallor. ENMT: No discharge from the ears eyes nose or mouth. Neck: No mass felt.  No neck rigidity. Respiratory: No rhonchi or crepitations. Cardiovascular: S1-S2 heard. Abdomen: Soft nontender bowel sounds present. Musculoskeletal: No edema. Skin: No rash. Neurologic: Alert awake oriented time place and person.  Moves all extremities. Psychiatric: Appears normal with normal affect.   Labs on Admission: I have personally reviewed following labs and imaging studies  CBC: Recent Labs  Lab 08/21/19 2217  WBC 5.7  NEUTROABS 4.5  HGB 13.6  HCT 43.0  MCV 90.3  PLT 962   Basic Metabolic Panel: Recent Labs  Lab 08/21/19 2217 08/22/19 0200  NA 135 139  K 5.4* 5.2*  CL 99 106  CO2 11* 10*  GLUCOSE 586* 450*  BUN 28* 28*  CREATININE 1.86* 1.89*  CALCIUM 8.4* 8.0*   GFR: CrCl cannot be calculated (Unknown ideal weight.). Liver Function Tests: No results for input(s): AST, ALT, ALKPHOS, BILITOT, PROT, ALBUMIN in the last 168 hours. No results for input(s): LIPASE, AMYLASE in the last 168 hours. No results for input(s): AMMONIA in the last 168 hours. Coagulation Profile: No results for input(s): INR, PROTIME in the last 168  hours. Cardiac Enzymes: No results for input(s): CKTOTAL, CKMB, CKMBINDEX, TROPONINI in the last 168 hours. BNP (last 3 results) No results for input(s): PROBNP in the last 8760 hours. HbA1C: No results for input(s): HGBA1C in the last 72 hours. CBG: Recent Labs  Lab 08/21/19 2348 08/22/19 0050 08/22/19 0125 08/22/19 0205 08/22/19 0241  GLUCAP 500* 557* 480* 443* 275*   Lipid Profile: No results for input(s): CHOL, HDL, LDLCALC, TRIG, CHOLHDL, LDLDIRECT in the last 72 hours. Thyroid Function Tests: No results for input(s): TSH, T4TOTAL, FREET4, T3FREE, THYROIDAB in the last 72 hours. Anemia Panel: No results for input(s): VITAMINB12, FOLATE, FERRITIN, TIBC, IRON, RETICCTPCT in the last 72 hours. Urine analysis:    Component Value Date/Time   COLORURINE STRAW (A) 08/21/2019 2217   APPEARANCEUR CLEAR 08/21/2019 2217   LABSPEC 1.022 08/21/2019 2217   PHURINE 5.0 08/21/2019 2217   GLUCOSEU >=500 (A) 08/21/2019 2217   HGBUR NEGATIVE 08/21/2019 2217   HGBUR negative 05/12/2007 1355   BILIRUBINUR NEGATIVE 08/21/2019 2217   BILIRUBINUR negative 05/23/2014 1052   KETONESUR 80 (A) 08/21/2019 2217   PROTEINUR NEGATIVE 08/21/2019 2217   UROBILINOGEN 0.2 10/04/2014 1749   NITRITE NEGATIVE 08/21/2019 2217   LEUKOCYTESUR NEGATIVE 08/21/2019 2217   Sepsis Labs: @LABRCNTIP (procalcitonin:4,lacticidven:4) )No results found for this or any previous visit (from the past 240 hour(s)).   Radiological Exams on Admission: No results found.   Assessment/Plan Principal Problem:   DKA (diabetic ketoacidoses) (HCC) Active Problems:   Polysubstance abuse ([place])   ARF (acute renal failure) ([place])    1. Diabetic ketoacidosis likely precipitated by patient being noncompliant with his medications.  For now patient is on IV insulin infusion IV fluids follow metabolic monitor closely and change to long-acting insulin once anion gap gets corrected. 2. Nausea vomiting with epigastric pain could be  from gastroparesis.  LFTs lipase and CT renal study are pending. 3. Acute on chronic and disease stage III likely worsened by nausea vomiting. 4. Cocaine abuse advised to quit using drugs.  COVID-19 test x-ray of the right wrist and CT renal study are pending.  Given the DKA picture with dehydration renal failure will need more than 2 midnight stay in inpatient status.   DVT prophylaxis: Lovenox. Code Status: Full code. Family Communication: Discussed with patient. Disposition Plan: Home. Consults called: None. Admission status: Inpatient.   Rise Patience MD [facility] Pager [phone].  If 7PM-7AM, please contact night-coverage [email or web address] Password [email or web address]  08/22/2019, 3:13 AM

## 2019-08-22 NOTE — ED Notes (Signed)
Pt. Documented in error see above note in chart. 

## 2019-08-22 NOTE — Progress Notes (Addendum)
Progress Note    John Cox  YKD:[id number] DOB: Sep 15, 1970  DOA: 08/21/2019 PCP: Patient, No Pcp Per       Assessment/Plan:   Principal Problem:   DKA (diabetic ketoacidoses) (HCC) Active Problems:   Polysubstance abuse (HCC)   ARF (acute renal failure) (HCC)   There is no height or weight on file to calculate BMI.   DKA: Resolved.  He is off of IV insulin infusion.  He has been started on insulin NPH which he takes at home.  Continue to monitor glucose levels.   AKI on probable underlying CKD stage III: Monitor BMP  Hyperkalemia: Resolved  Nausea, vomiting and epigastric pain: Improved.  Lipase is normal.  Liver enzymes are okay.  Cocaine abuse: Counseled to quit.   ADDENDUM: Patient reverted back to DKA so he has been restarted on IV insulin drip and IV fluids. Potassium and creatinine are going up again.   Family Communication/Anticipated D/C date and plan/Code Status   DVT prophylaxis: Lovenox Code Status: Full code Family Communication: Plan discussed with the patient Disposition Plan: Possible discharge home in 1 to 2 days      Subjective:   He feels a little better.  No vomiting, diarrhea or abdominal pain.  Objective:    Vitals:   08/22/19 1330 08/22/19 1400 08/22/19 1430 08/22/19 1500  BP: (!) 115/56 (!) 120/59 (!) 126/54 134/60  Pulse: 80 83 75 79  Resp: 16 16 15 16   Temp:      TempSrc:      SpO2: 100% 100% 100% 100%    Intake/Output Summary (Last 24 hours) at 08/22/2019 1534 Last data filed at 08/22/2019 0140 Gross per 24 hour  Intake 2000 ml  Output --  Net 2000 ml   There were no vitals filed for this visit.  Exam:  GEN: NAD SKIN: No rash EYES: EOMI ENT: MMM CV: RRR PULM: CTA B ABD: soft, ND, NT, +BS CNS: AAO x 3, non focal EXT: No edema or tenderness   Data Reviewed:   I have personally reviewed following labs and imaging studies:  Labs: Labs show the following:   Basic Metabolic Panel: Recent Labs   Lab 08/21/19 2217 08/22/19 0200 08/22/19 0410 08/22/19 0615 08/22/19 1156  NA 135 139 142 142 142  K 5.4* 5.2* 4.6 4.9 5.2*  CL 99 106 112* 112* 109  CO2 11* 10* 14* 15* 16*  GLUCOSE 586* 450* 208* 155* 213*  BUN 28* 28* 26* 23* 20  CREATININE 1.86* 1.89* 1.76* 1.58* 1.55*  CALCIUM 8.4* 8.0* 8.1* 8.0* 8.1*   GFR CrCl cannot be calculated (Unknown ideal weight.). Liver Function Tests: Recent Labs  Lab 08/22/19 0615  AST 31  ALT 40  ALKPHOS 59  BILITOT 1.6*  PROT 5.5*  ALBUMIN 3.1*   Recent Labs  Lab 08/22/19 0615  LIPASE 27   No results for input(s): AMMONIA in the last 168 hours. Coagulation profile No results for input(s): INR, PROTIME in the last 168 hours.  CBC: Recent Labs  Lab 08/21/19 2217 08/22/19 0410  WBC 5.7 6.9  NEUTROABS 4.5  --   HGB 13.6 12.9*  HCT 43.0 41.0  MCV 90.3 88.4  PLT 301 293   Cardiac Enzymes: Recent Labs  Lab 08/22/19 0410  CKTOTAL 135   BNP (last 3 results) No results for input(s): PROBNP in the last 8760 hours. CBG: Recent Labs  Lab 08/22/19 0610 08/22/19 0712 08/22/19 0823 08/22/19 0952 08/22/19 1207  GLUCAP 137* 140*  123* 139* 206*   D-Dimer: No results for input(s): DDIMER in the last 72 hours. Hgb A1c: Recent Labs    08/22/19 0410  HGBA1C 12.2*   Lipid Profile: No results for input(s): CHOL, HDL, LDLCALC, TRIG, CHOLHDL, LDLDIRECT in the last 72 hours. Thyroid function studies: No results for input(s): TSH, T4TOTAL, T3FREE, THYROIDAB in the last 72 hours.  Invalid input(s): FREET3 Anemia work up: No results for input(s): VITAMINB12, FOLATE, FERRITIN, TIBC, IRON, RETICCTPCT in the last 72 hours. Sepsis Labs: Recent Labs  Lab 08/21/19 2217 08/22/19 0410  WBC 5.7 6.9    Microbiology Recent Results (from the past 240 hour(s))  SARS CORONAVIRUS 2 (TAT 6-24 HRS) Nasopharyngeal Nasopharyngeal Swab     Status: None   Collection Time: 08/22/19  1:08 AM   Specimen: Nasopharyngeal Swab  Result Value  Ref Range Status   SARS Coronavirus 2 NEGATIVE NEGATIVE Final    Comment: (NOTE) SARS-CoV-2 target nucleic acids are NOT DETECTED. The SARS-CoV-2 RNA is generally detectable in upper and lower respiratory specimens during the acute phase of infection. Negative results do not preclude SARS-CoV-2 infection, do not rule out co-infections with other pathogens, and should not be used as the sole basis for treatment or other patient management decisions. Negative results must be combined with clinical observations, patient history, and epidemiological information. The expected result is Negative. Fact Sheet for Patients: HairSlick.nohttps://www.fda.gov/media/138098/download Fact Sheet for Healthcare Providers: quierodirigir.comhttps://www.fda.gov/media/138095/download This test is not yet approved or cleared by the [place] FDA and  has been authorized for detection and/or diagnosis of SARS-CoV-2 by FDA under an Emergency Use Authorization (EUA). This EUA will remain  in effect (meaning this test can be used) for the duration of the COVID-19 declaration under Section 56 4(b)(1) of the Act, 21 U.S.C. section 360bbb-3(b)(1), unless the authorization is terminated or revoked sooner. Performed at [facility], [address] [place], [place] [address]     Procedures and diagnostic studies:  DG Wrist 2 Views Right  Result Date: 08/22/2019 CLINICAL DATA:  Right wrist pain after fall striking wrist on something. EXAM: RIGHT WRIST - 2 VIEW COMPARISON:  None. FINDINGS: Lateral view is slightly limited by positioning. There is no evidence of fracture or dislocation. Minimal radial styloid spurring. There is no evidence of arthropathy or other focal bone abnormality. Soft tissues are unremarkable. IMPRESSION: No fracture or subluxation of the right wrist. Electronically Signed   By: Narda RutherfordMelanie  Sanford M.D.   On: 08/22/2019 04:57   CT RENAL STONE STUDY  Result Date: 08/22/2019 CLINICAL DATA:  Flank pain with kidney  stone suspected EXAM: CT ABDOMEN AND PELVIS WITHOUT CONTRAST TECHNIQUE: Multidetector CT imaging of the abdomen and pelvis was performed following the standard protocol without IV contrast. COMPARISON:  03/22/2016 FINDINGS: Lower chest:  Coronary calcifications seen along the RCA. Hepatobiliary: No focal liver abnormality.No evidence of biliary obstruction or stone. Pancreas: Unremarkable. Spleen: Unremarkable. Adrenals/Urinary Tract: Negative adrenals. No hydronephrosis or stone. Distended urinary bladder which could be physiologic. Stomach/Bowel:  No obstruction. No appendicitis. Vascular/Lymphatic: No acute vascular abnormality. Scattered atherosclerotic calcifications, notable for age. No mass or adenopathy. Reproductive:No pathologic findings. Other: No ascites or pneumoperitoneum. Musculoskeletal: No acute abnormalities. IMPRESSION: 1. No hydronephrosis or ureteral calculus. 2. Distended urinary bladder. 3.  Aortic Atherosclerosis (ICD10-I70.0).  Coronary atherosclerosis. Electronically Signed   By: Marnee SpringJonathon  Watts M.D.   On: 08/22/2019 05:42    Medications:   . enoxaparin (LOVENOX) injection  40 mg Subcutaneous Q24H  . insulin NPH Human  20 Units Subcutaneous BID AC & HS   Continuous Infusions: . sodium chloride Stopped (08/22/19 1244)  . dextrose 5 % and 0.45% NaCl 125 mL/hr at 08/22/19 0403     LOS: 0 days   Christiana Gurevich  [facility]   *Please refer to [email or web address], password [email or web address] to get updated schedule on who will round on this patient, as hospitalists switch teams weekly. If 7PM-7AM, please contact night-coverage at [email or web address], password [email or web address] for any overnight needs.  08/22/2019, 3:34 PM

## 2019-08-22 NOTE — Progress Notes (Signed)
Inpatient Diabetes Program Recommendations  AACE/ADA: New Consensus Statement on Inpatient Glycemic Control (2015)  Target Ranges:  Prepandial:   less than 140 mg/dL      Peak postprandial:   less than 180 mg/dL (1-2 hours)      Critically ill patients:  140 - 180 mg/dL   Lab Results  Component Value Date   GLUCAP 269 (H) 08/22/2019   HGBA1C 12.2 (H) 08/22/2019    Review of Glycemic Control  Diabetes history: DM1 Outpatient Diabetes medications: Novolin 70/30 25 units bid Current orders for Inpatient glycemic control: IV insulin per EndoTool  HgbA1C - 12.2% AG - 26, CO2 - 7 - Continues in DKA  EndoTool Mode of Therapy - Hyperglycemia Needs to be changed to DKA. Secure text sent to RN.  Inpatient Diabetes Program Recommendations:    Change EndoTool mode of therapy to DKA instead of hyperglycemia.   Will speak with pt regarding his glycemic control when appropriate. Pt has been seen by Diabetes Coordinators during admissions over past year. Has missed appts at [facility] in the past. Does not take insulin consistently.  Note that pt was positive for Cocaine this admission. With this information in mind, it will be very hard for pt to take care of himself and his diabetes if the substance abuse continues.  Will continue to follow pt during hospitalization.  Thank you. Lorenda Peck, RD, LDN, CDE Inpatient Diabetes Coordinator [phone]

## 2019-08-22 NOTE — ED Notes (Addendum)
Dr. Jennye Boroughs aware patients CBG 476. Per MD, awaiting results from repeat BMP.

## 2019-08-23 ENCOUNTER — Other Ambulatory Visit: Payer: Self-pay

## 2019-08-23 LAB — BASIC METABOLIC PANEL WITH GFR
Anion gap: 18 — ABNORMAL HIGH (ref 5–15)
Anion gap: 16 — ABNORMAL HIGH (ref 5–15)
Anion gap: 17 — ABNORMAL HIGH (ref 5–15)
Anion gap: 14 (ref 5–15)
BUN: 24 mg/dL — ABNORMAL HIGH (ref 6–20)
BUN: 22 mg/dL — ABNORMAL HIGH (ref 6–20)
BUN: 22 mg/dL — ABNORMAL HIGH (ref 6–20)
BUN: 23 mg/dL — ABNORMAL HIGH (ref 6–20)
CO2: 15 mmol/L — ABNORMAL LOW (ref 22–32)
CO2: 16 mmol/L — ABNORMAL LOW (ref 22–32)
CO2: 17 mmol/L — ABNORMAL LOW (ref 22–32)
CO2: 17 mmol/L — ABNORMAL LOW (ref 22–32)
Calcium: 8.5 mg/dL — ABNORMAL LOW (ref 8.9–10.3)
Calcium: 8.3 mg/dL — ABNORMAL LOW (ref 8.9–10.3)
Calcium: 8.4 mg/dL — ABNORMAL LOW (ref 8.9–10.3)
Calcium: 8.3 mg/dL — ABNORMAL LOW (ref 8.9–10.3)
Chloride: 109 mmol/L (ref 98–111)
Chloride: 110 mmol/L (ref 98–111)
Chloride: 107 mmol/L (ref 98–111)
Chloride: 107 mmol/L (ref 98–111)
Creatinine, Ser: 1.73 mg/dL — ABNORMAL HIGH (ref 0.61–1.24)
Creatinine, Ser: 1.7 mg/dL — ABNORMAL HIGH (ref 0.61–1.24)
Creatinine, Ser: 1.52 mg/dL — ABNORMAL HIGH (ref 0.61–1.24)
Creatinine, Ser: 1.52 mg/dL — ABNORMAL HIGH (ref 0.61–1.24)
GFR calc Af Amer: 53 mL/min — ABNORMAL LOW
GFR calc Af Amer: 54 mL/min — ABNORMAL LOW
GFR calc Af Amer: >60 mL/min
GFR calc Af Amer: >60 mL/min
GFR calc non Af Amer: 45 mL/min — ABNORMAL LOW
GFR calc non Af Amer: 46 mL/min — ABNORMAL LOW
GFR calc non Af Amer: 53 mL/min — ABNORMAL LOW
GFR calc non Af Amer: 53 mL/min — ABNORMAL LOW
Glucose, Bld: 165 mg/dL — ABNORMAL HIGH (ref 70–99)
Glucose, Bld: 108 mg/dL — ABNORMAL HIGH (ref 70–99)
Glucose, Bld: 144 mg/dL — ABNORMAL HIGH (ref 70–99)
Glucose, Bld: 206 mg/dL — ABNORMAL HIGH (ref 70–99)
Potassium: 4.7 mmol/L (ref 3.5–5.1)
Potassium: 4.3 mmol/L (ref 3.5–5.1)
Potassium: 4.7 mmol/L (ref 3.5–5.1)
Potassium: 4.4 mmol/L (ref 3.5–5.1)
Sodium: 142 mmol/L (ref 135–145)
Sodium: 142 mmol/L (ref 135–145)
Sodium: 141 mmol/L (ref 135–145)
Sodium: 138 mmol/L (ref 135–145)

## 2019-08-23 LAB — GLUCOSE, CAPILLARY
Glucose-Capillary: 147 mg/dL — ABNORMAL HIGH (ref 70–99)
Glucose-Capillary: 95 mg/dL (ref 70–99)
Glucose-Capillary: 96 mg/dL (ref 70–99)
Glucose-Capillary: 115 mg/dL — ABNORMAL HIGH (ref 70–99)
Glucose-Capillary: 134 mg/dL — ABNORMAL HIGH (ref 70–99)
Glucose-Capillary: 116 mg/dL — ABNORMAL HIGH (ref 70–99)
Glucose-Capillary: 99 mg/dL (ref 70–99)
Glucose-Capillary: 169 mg/dL — ABNORMAL HIGH (ref 70–99)
Glucose-Capillary: 240 mg/dL — ABNORMAL HIGH (ref 70–99)
Glucose-Capillary: 221 mg/dL — ABNORMAL HIGH (ref 70–99)

## 2019-08-23 LAB — CBG MONITORING, ED
Glucose-Capillary: 133 mg/dL — ABNORMAL HIGH (ref 70–99)
Glucose-Capillary: 144 mg/dL — ABNORMAL HIGH (ref 70–99)

## 2019-08-23 LAB — MRSA PCR SCREENING: MRSA by PCR: NEGATIVE

## 2019-08-23 LAB — BETA-HYDROXYBUTYRIC ACID
Beta-Hydroxybutyric Acid: 6.75 mmol/L — ABNORMAL HIGH (ref 0.05–0.27)
Beta-Hydroxybutyric Acid: 5.87 mmol/L — ABNORMAL HIGH (ref 0.05–0.27)

## 2019-08-23 MED ORDER — INSULIN ASPART 100 UNIT/ML ~~LOC~~ SOLN
0.0000 [IU] | Freq: Every day | SUBCUTANEOUS | Status: DC
  Administered 2019-08-23: 23:00:00 2 [IU] via SUBCUTANEOUS

## 2019-08-23 MED ORDER — ORAL CARE MOUTH RINSE
15.0000 mL | Freq: Two times a day (BID) | OROMUCOSAL | Status: DC
  Administered 2019-08-23 – 2019-08-24 (×3): 15 mL via OROMUCOSAL

## 2019-08-23 MED ORDER — INSULIN NPH (HUMAN) (ISOPHANE) 100 UNIT/ML ~~LOC~~ SUSP
25.0000 [IU] | Freq: Two times a day (BID) | SUBCUTANEOUS | Status: DC
  Filled 2019-08-23: qty 10

## 2019-08-23 MED ORDER — INSULIN ASPART 100 UNIT/ML ~~LOC~~ SOLN
3.0000 [IU] | Freq: Three times a day (TID) | SUBCUTANEOUS | Status: DC
  Administered 2019-08-24 (×2): 3 [IU] via SUBCUTANEOUS

## 2019-08-23 MED ORDER — CHLORHEXIDINE GLUCONATE CLOTH 2 % EX PADS
6.0000 | MEDICATED_PAD | Freq: Every day | CUTANEOUS | Status: DC
  Administered 2019-08-23: 20:00:00 6 via TOPICAL

## 2019-08-23 MED ORDER — INSULIN GLARGINE 100 UNIT/ML ~~LOC~~ SOLN
8.0000 [IU] | SUBCUTANEOUS | Status: DC
  Administered 2019-08-23: 23:00:00 8 [IU] via SUBCUTANEOUS
  Filled 2019-08-23 (×2): qty 0.08

## 2019-08-23 MED ORDER — SODIUM CHLORIDE 0.9 % IV SOLN: INTRAVENOUS | Status: DC

## 2019-08-23 MED ORDER — INSULIN ASPART 100 UNIT/ML ~~LOC~~ SOLN
0.0000 [IU] | Freq: Three times a day (TID) | SUBCUTANEOUS | Status: DC
  Administered 2019-08-24: 12:00:00 1 [IU] via SUBCUTANEOUS
  Administered 2019-08-24: 08:00:00 3 [IU] via SUBCUTANEOUS

## 2019-08-23 MED ORDER — INSULIN NPH (HUMAN) (ISOPHANE) 100 UNIT/ML ~~LOC~~ SUSP
20.0000 [IU] | Freq: Two times a day (BID) | SUBCUTANEOUS | Status: DC
  Administered 2019-08-23: 20 [IU] via SUBCUTANEOUS
  Filled 2019-08-23: qty 10

## 2019-08-23 MED ORDER — GABAPENTIN 300 MG PO CAPS
300.0000 mg | ORAL_CAPSULE | Freq: Two times a day (BID) | ORAL | Status: DC
  Administered 2019-08-23 – 2019-08-24 (×3): 300 mg via ORAL
  Filled 2019-08-23 (×3): qty 1

## 2019-08-23 MED ORDER — INSULIN ASPART PROT & ASPART (70-30 MIX) 100 UNIT/ML ~~LOC~~ SUSP
25.0000 [IU] | Freq: Two times a day (BID) | SUBCUTANEOUS | Status: DC
  Administered 2019-08-23: 25 [IU] via SUBCUTANEOUS
  Filled 2019-08-23: qty 10

## 2019-08-23 NOTE — Progress Notes (Addendum)
Inpatient Diabetes Program Recommendations  AACE/ADA: New Consensus Statement on Inpatient Glycemic Control (2015)  Target Ranges:  Prepandial:   less than 140 mg/dL      Peak postprandial:   less than 180 mg/dL (1-2 hours)      Critically ill patients:  140 - 180 mg/dL   Lab Results  Component Value Date   GLUCAP 169 (H) 08/23/2019   HGBA1C 12.2 (H) 08/22/2019    Review of Glycemic Control  Diabetes history: DM1 Outpatient Diabetes medications: Novolin 70/30 25 units bid Current orders for Inpatient glycemic control: NPH 25 units bid  HgbA1C - 12.2% Needs bolus (correction and meal coverage) insulin since Type 1 and makes no insulin.  Pt eating 100%. CO2 - 17, AG - 14. Will need another BMET at 1900.   Inpatient Diabetes Program Recommendations:     Add Novolog 0-9 units tidwc and 0-5 units at bedtime Add meal coverage - Novolog 6 units tidwc if pt eats > 50% meal Decrease NPH to 15 units bid (AM and HS)  Spoke with pt regarding his HgbA1C of 12.2%. Pt states he has gone several days without his insulin, but could not tell me why. Michela Pitcher he gets it from [facility]. The Inpatient Diabetes Team has spoken to pt on numerous occasions about importance of controlling his blood sugars, monitoring, and f/u with PCP. As long as pt is cocaine +, it will be difficult to focus on taking care of his Type 1 diabetes.  Discussed with RN and secure text to MD regarding adding Novolog correction and meal coverage.  Thank you. Lorenda Peck, RD, LDN, CDE Inpatient Diabetes Coordinator [phone]

## 2019-08-23 NOTE — ED Notes (Signed)
ED TO INPATIENT HANDOFF REPORT  ED Nurse Name and Phone #:  Leatrice Jewels, RN   S Name/Age/Gender Rhae Lerner 48 y.o. male Room/Bed: WA24/WA24  Code Status   Code Status: Full Code  Home/SNF/Other Home Patient oriented to: self, place, time and situation Is this baseline? Yes   Triage Complete: Triage complete  Chief Complaint DKA (diabetic ketoacidoses) (HCC) [E11.10]  Triage Note BIB EMS. Pt states he has not has his insulin since last week. Pt reports N/V and feeling unwell for the past few days and not felt like eating. PT reports falling yesterday and hitting right wrist and complains of wrist pain. Pt A/O hx of diabetes CBG 435 with EMS.  500cc fluids 4mg  of Zofran  with EMS.    Allergies No Known Allergies  Level of Care/Admitting Diagnosis ED Disposition    ED Disposition Condition Comment   Admit  Hospital Area: [facility] [[id number]]  Level of Care: Stepdown [14]  Admit to SDU based on following criteria: Severe physiological/psychological symptoms:  Any diagnosis requiring assessment & intervention at least every 4 hours on an ongoing basis to obtain desired patient outcomes including stability and rehabilitation  Covid Evaluation: Asymptomatic Screening Protocol (No Symptoms)  Diagnosis: DKA (diabetic ketoacidoses) [facility]) [facility]  Admitting Physician: [id number] [phone]  Attending Physician: [phone]  Estimated length of stay: past midnight tomorrow  Certification:: I certify this patient will need inpatient services for at least 2 midnights       B Medical/Surgery History Past Medical History:  Diagnosis Date  . AKI (acute kidney injury) (HCC) 01/25/2018  . Anxiety   . Bipolar disorder (HCC)   . Depression 2000  . Diabetes mellitus 1995  . Hyperglycemia 01/25/2018  . Nausea & vomiting   . Polysubstance abuse (HCC) 2012   relapse 2 wk ago   . Suicidal ideation    Past Surgical History:  Procedure  Laterality Date  . NO PAST SURGERIES       A IV Location/Drains/Wounds Patient Lines/Drains/Airways Status   Active Line/Drains/Airways    Name:   Placement date:   Placement time:   Site:   Days:   Peripheral IV 08/21/19 Left Antecubital   08/21/19    2201    Antecubital   2   Peripheral IV 08/22/19 Right Forearm   08/22/19    0151    Forearm   1          Intake/Output Last 24 hours  Intake/Output Summary (Last 24 hours) at 08/23/2019 0308 Last data filed at 08/22/2019 1830 Gross per 24 hour  Intake --  Output 800 ml  Net -800 ml    Labs/Imaging Results for orders placed or performed during the hospital encounter of 08/21/19 (from the past 48 hour(s))  CBG monitoring, ED     Status: Abnormal   Collection Time: 08/21/19  9:51 PM  Result Value Ref Range   Glucose-Capillary 546 (HH) 70 - 99 mg/dL  CBC with Differential     Status: None   Collection Time: 08/21/19 10:17 PM  Result Value Ref Range   WBC 5.7 4.0 - 10.5 K/uL   RBC 4.76 4.22 - 5.81 MIL/uL   Hemoglobin 13.6 13.0 - 17.0 g/dL   HCT 08/23/19 32.2 - 02.5 %   MCV 90.3 80.0 - 100.0 fL   MCH 28.6 26.0 - 34.0 pg   MCHC 31.6 30.0 - 36.0 g/dL   RDW 42.7 06.2 - 37.6 %  Platelets 301 150 - 400 K/uL   nRBC 0.0 0.0 - 0.2 %   Neutrophils Relative % 78 %   Neutro Abs 4.5 1.7 - 7.7 K/uL   Lymphocytes Relative 15 %   Lymphs Abs 0.9 0.7 - 4.0 K/uL   Monocytes Relative 5 %   Monocytes Absolute 0.3 0.1 - 1.0 K/uL   Eosinophils Relative 0 %   Eosinophils Absolute 0.0 0.0 - 0.5 K/uL   Basophils Relative 1 %   Basophils Absolute 0.0 0.0 - 0.1 K/uL   Immature Granulocytes 1 %   Abs Immature Granulocytes 0.04 0.00 - 0.07 K/uL    Comment: Performed at [facility], [address], [place], [place] [address]  BMET     Status: Abnormal   Collection Time: 08/21/19 10:17 PM  Result Value Ref Range   Sodium 135 135 - 145 mmol/L   Potassium 5.4 (H) 3.5 - 5.1 mmol/L   Chloride 99 98 - 111 mmol/L   CO2 11 (L) 22  - 32 mmol/L   Glucose, Bld 586 (HH) 70 - 99 mg/dL    Comment: CRITICAL RESULT CALLED TO, READ BACK BY AND VERIFIED WITH: A LOVINGS,RN 08/21/19 2323 RHOLMES    BUN 28 (H) 6 - 20 mg/dL   Creatinine, Ser 1.91 (H) 0.61 - 1.24 mg/dL   Calcium 8.4 (L) 8.9 - 10.3 mg/dL   GFR calc non Af Amer 42 (L) >60 mL/min   GFR calc Af Amer 48 (L) >60 mL/min   Anion gap 25 (H) 5 - 15    Comment: Performed at [facility], [address] [place], [place] [address]  UA     Status: Abnormal   Collection Time: 08/21/19 10:17 PM  Result Value Ref Range   Color, Urine STRAW (A) YELLOW   APPearance CLEAR CLEAR   Specific Gravity, Urine 1.022 1.005 - 1.030   pH 5.0 5.0 - 8.0   Glucose, UA >=500 (A) NEGATIVE mg/dL   Hgb urine dipstick NEGATIVE NEGATIVE   Bilirubin Urine NEGATIVE NEGATIVE   Ketones, ur 80 (A) NEGATIVE mg/dL   Protein, ur NEGATIVE NEGATIVE mg/dL   Nitrite NEGATIVE NEGATIVE   Leukocytes,Ua NEGATIVE NEGATIVE   Bacteria, UA NONE SEEN NONE SEEN   Mucus PRESENT     Comment: Performed at [facility], [address], [place], [place] [address]  VBG     Status: Abnormal   Collection Time: 08/21/19 10:17 PM  Result Value Ref Range   pH, Ven 7.205 (L) 7.250 - 7.430   pCO2, Ven 32.0 (L) 44.0 - 60.0 mmHg   pO2, Ven 36.3 32.0 - 45.0 mmHg   Bicarbonate 12.2 (L) 20.0 - 28.0 mmol/L   Acid-base deficit 14.6 (H) 0.0 - 2.0 mmol/L   O2 Saturation 56.7 %   Patient temperature 98.6     Comment: Performed at [facility], [address], [place], [place] [address]  Rapid urine drug screen (hospital performed)     Status: Abnormal   Collection Time: 08/21/19 10:23 PM  Result Value Ref Range   Opiates NONE DETECTED NONE DETECTED   Cocaine POSITIVE (A) NONE DETECTED   Benzodiazepines NONE DETECTED NONE DETECTED   Amphetamines NONE DETECTED NONE DETECTED   Tetrahydrocannabinol NONE DETECTED NONE DETECTED   Barbiturates NONE DETECTED NONE DETECTED     Comment: (NOTE) DRUG SCREEN FOR MEDICAL PURPOSES ONLY.  IF CONFIRMATION IS NEEDED FOR ANY PURPOSE, NOTIFY LAB WITHIN 5 DAYS. LOWEST DETECTABLE LIMITS FOR URINE  DRUG SCREEN Drug Class                     Cutoff (ng/mL) Amphetamine and metabolites    1000 Barbiturate and metabolites    200 Benzodiazepine                 200 Tricyclics and metabolites     300 Opiates and metabolites        300 Cocaine and metabolites        300 THC                            50 Performed at [facility], [address] [place], [place] [address]   CBG monitoring, ED     Status: Abnormal   Collection Time: 08/21/19 11:48 PM  Result Value Ref Range   Glucose-Capillary 500 (H) 70 - 99 mg/dL  Beta-hydroxybutyric acid     Status: Abnormal   Collection Time: 08/22/19 12:30 AM  Result Value Ref Range   Beta-Hydroxybutyric Acid >8.00 (H) 0.05 - 0.27 mmol/L    Comment: Performed at [facility], [address], [place], [place] [address]  CBG monitoring, ED     Status: Abnormal   Collection Time: 08/22/19 12:50 AM  Result Value Ref Range   Glucose-Capillary 557 (HH) 70 - 99 mg/dL   Comment 1 Notify RN   SARS CORONAVIRUS 2 (TAT 6-24 HRS) Nasopharyngeal Nasopharyngeal Swab     Status: None   Collection Time: 08/22/19  1:08 AM   Specimen: Nasopharyngeal Swab  Result Value Ref Range   SARS Coronavirus 2 NEGATIVE NEGATIVE    Comment: (NOTE) SARS-CoV-2 target nucleic acids are NOT DETECTED. The SARS-CoV-2 RNA is generally detectable in upper and lower respiratory specimens during the acute phase of infection. Negative results do not preclude SARS-CoV-2 infection, do not rule out co-infections with other pathogens, and should not be used as the sole basis for treatment or other patient management decisions. Negative results must be combined with clinical observations, patient history, and epidemiological information. The expected result is Negative. Fact Sheet for  Patients: HairSlick.no Fact Sheet for Healthcare Providers: quierodirigir.com This test is not yet approved or cleared by the [place] FDA and  has been authorized for detection and/or diagnosis of SARS-CoV-2 by FDA under an Emergency Use Authorization (EUA). This EUA will remain  in effect (meaning this test can be used) for the duration of the COVID-19 declaration under Section 56 4(b)(1) of the Act, 21 U.S.C. section 360bbb-3(b)(1), unless the authorization is terminated or revoked sooner. Performed at [facility], [address] [place], [place] [address]   CBG monitoring, ED     Status: Abnormal   Collection Time: 08/22/19  1:25 AM  Result Value Ref Range   Glucose-Capillary 480 (H) 70 - 99 mg/dL  Basic metabolic panel     Status: Abnormal   Collection Time: 08/22/19  2:00 AM  Result Value Ref Range   Sodium 139 135 - 145 mmol/L   Potassium 5.2 (H) 3.5 - 5.1 mmol/L   Chloride 106 98 - 111 mmol/L   CO2 10 (L) 22 - 32 mmol/L   Glucose, Bld 450 (H) 70 - 99 mg/dL   BUN 28 (H) 6 - 20 mg/dL   Creatinine, Ser 3.32 (H) 0.61 - 1.24 mg/dL   Calcium 8.0 (L) 8.9 - 10.3 mg/dL   GFR calc non Af Amer 41 (  L) >60 mL/min   GFR calc Af Amer 47 (L) >60 mL/min   Anion gap 23 (H) 5 - 15    Comment: Performed at [facility], [address] [place], [place] [address]  CBG monitoring, ED     Status: Abnormal   Collection Time: 08/22/19  2:05 AM  Result Value Ref Range   Glucose-Capillary 443 (H) 70 - 99 mg/dL  CBG monitoring, ED     Status: Abnormal   Collection Time: 08/22/19  2:41 AM  Result Value Ref Range   Glucose-Capillary 275 (H) 70 - 99 mg/dL  CBG monitoring, ED     Status: Abnormal   Collection Time: 08/22/19  3:22 AM  Result Value Ref Range   Glucose-Capillary 258 (H) 70 - 99 mg/dL  CBG monitoring, ED     Status: Abnormal   Collection Time: 08/22/19  3:53 AM  Result Value Ref Range    Glucose-Capillary 230 (H) 70 - 99 mg/dL  CBC     Status: Abnormal   Collection Time: 08/22/19  4:10 AM  Result Value Ref Range   WBC 6.9 4.0 - 10.5 K/uL   RBC 4.64 4.22 - 5.81 MIL/uL   Hemoglobin 12.9 (L) 13.0 - 17.0 g/dL   HCT 60.4 54.0 - 98.1 %   MCV 88.4 80.0 - 100.0 fL   MCH 27.8 26.0 - 34.0 pg   MCHC 31.5 30.0 - 36.0 g/dL   RDW 19.1 47.8 - 29.5 %   Platelets 293 150 - 400 K/uL   nRBC 0.0 0.0 - 0.2 %    Comment: Performed at [facility], [address], [place], [place] [address]  Basic metabolic panel     Status: Abnormal   Collection Time: 08/22/19  4:10 AM  Result Value Ref Range   Sodium 142 135 - 145 mmol/L   Potassium 4.6 3.5 - 5.1 mmol/L   Chloride 112 (H) 98 - 111 mmol/L   CO2 14 (L) 22 - 32 mmol/L   Glucose, Bld 208 (H) 70 - 99 mg/dL   BUN 26 (H) 6 - 20 mg/dL   Creatinine, Ser 8.65 (H) 0.61 - 1.24 mg/dL   Calcium 8.1 (L) 8.9 - 10.3 mg/dL   GFR calc non Af Amer 44 (L) >60 mL/min   GFR calc Af Amer 51 (L) >60 mL/min   Anion gap 16 (H) 5 - 15    Comment: Performed at [facility], [address], [place], [place] [address]  Beta-hydroxybutyric acid     Status: Abnormal   Collection Time: 08/22/19  4:10 AM  Result Value Ref Range   Beta-Hydroxybutyric Acid 6.79 (H) 0.05 - 0.27 mmol/L    Comment: RESULTS CONFIRMED BY MANUAL DILUTION Performed at [facility], [address] [place], [place] [address]   Hemoglobin A1c     Status: Abnormal   Collection Time: 08/22/19  4:10 AM  Result Value Ref Range   Hgb A1c MFr Bld 12.2 (H) 4.8 - 5.6 %    Comment: (NOTE) Pre diabetes:          5.7%-6.4% Diabetes:              >6.4% Glycemic control for   <7.0% adults with diabetes    Mean Plasma Glucose 303.44 mg/dL    Comment: Performed at [facility], [address], [place], [place] [address]  CK     Status: None   Collection Time: 08/22/19  4:10 AM  Result Value Ref Range   Total CK 135 49 - 397 U/L     Comment: Performed at [facility], [place] [address], [place], [place] [address]  Troponin I (High Sensitivity)     Status: None   Collection Time: 08/22/19  4:10 AM  Result Value Ref Range   Troponin I (High Sensitivity) 6 <18 ng/L    Comment: (NOTE) Elevated high sensitivity troponin I (hsTnI) values and significant  changes across serial measurements may suggest ACS but many other  chronic and acute conditions are known to elevate hsTnI results.  Refer to the "Links" section for chest pain algorithms and additional  guidance. Performed at [facility], [address] [place], [place] [address]   CBG monitoring, ED     Status: Abnormal   Collection Time: 08/22/19  5:03 AM  Result Value Ref Range   Glucose-Capillary 179 (H) 70 - 99 mg/dL  CBG monitoring, ED     Status: Abnormal   Collection Time: 08/22/19  6:10 AM  Result Value Ref Range   Glucose-Capillary 137 (H) 70 - 99 mg/dL  Basic metabolic panel     Status: Abnormal   Collection Time: 08/22/19  6:15 AM  Result Value Ref Range   Sodium 142 135 - 145 mmol/L   Potassium 4.9 3.5 - 5.1 mmol/L   Chloride 112 (H) 98 - 111 mmol/L   CO2 15 (L) 22 - 32 mmol/L   Glucose, Bld 155 (H) 70 - 99 mg/dL   BUN 23 (H) 6 - 20 mg/dL   Creatinine, Ser 1.58 (H) 0.61 - 1.24 mg/dL   Calcium 8.0 (L) 8.9 - 10.3 mg/dL   GFR calc non Af Amer 51 (L) >60 mL/min   GFR calc Af Amer 59 (L) >60 mL/min   Anion gap 15 5 - 15    Comment: Performed at [facility], [place] [address], [place], [place] [address]  Troponin I (High Sensitivity)     Status: None   Collection Time: 08/22/19  6:15 AM  Result Value Ref Range   Troponin I (High Sensitivity) 5 <18 ng/L    Comment: (NOTE) Elevated high sensitivity troponin I (hsTnI) values and significant  changes across serial measurements may suggest ACS but many other  chronic and acute conditions are known to elevate hsTnI results.  Refer to the "Links"  section for chest pain algorithms and additional  guidance. Performed at [facility], [place] [address], [place], [place] [address]   Hepatic function panel     Status: Abnormal   Collection Time: 08/22/19  6:15 AM  Result Value Ref Range   Total Protein 5.5 (L) 6.5 - 8.1 g/dL   Albumin 3.1 (L) 3.5 - 5.0 g/dL   AST 31 15 - 41 U/L   ALT 40 0 - 44 U/L   Alkaline Phosphatase 59 38 - 126 U/L   Total Bilirubin 1.6 (H) 0.3 - 1.2 mg/dL   Bilirubin, Direct 0.1 0.0 - 0.2 mg/dL   Indirect Bilirubin 1.5 (H) 0.3 - 0.9 mg/dL    Comment: Performed at [facility], [place] [address] [place], [place] [address]  Lipase, blood     Status: None   Collection Time: 08/22/19  6:15 AM  Result Value Ref Range   Lipase 27 11 - 51 U/L    Comment: Performed at [facility], [place] [address], [place],  [address]  CBG monitoring, ED     Status:  Abnormal   Collection Time: 08/22/19  7:12 AM  Result Value Ref Range   Glucose-Capillary 140 (H) 70 - 99 mg/dL  POC CBG, ED     Status: Abnormal   Collection Time: 08/22/19  8:23 AM  Result Value Ref Range   Glucose-Capillary 123 (H) 70 - 99 mg/dL  CBG monitoring, ED     Status: Abnormal   Collection Time: 08/22/19  9:52 AM  Result Value Ref Range   Glucose-Capillary 139 (H) 70 - 99 mg/dL  Basic metabolic panel     Status: Abnormal   Collection Time: 08/22/19 11:56 AM  Result Value Ref Range   Sodium 142 135 - 145 mmol/L   Potassium 5.2 (H) 3.5 - 5.1 mmol/L   Chloride 109 98 - 111 mmol/L   CO2 16 (L) 22 - 32 mmol/L   Glucose, Bld 213 (H) 70 - 99 mg/dL   BUN 20 6 - 20 mg/dL   Creatinine, Ser 9.601.55 (H) 0.61 - 1.24 mg/dL   Calcium 8.1 (L) 8.9 - 10.3 mg/dL   GFR calc non Af Amer 52 (L) >60 mL/min   GFR calc Af Amer >60 >60 mL/min   Anion gap 17 (H) 5 - 15    Comment: Performed at [facility], [address] [place], [place] [address]  Beta-hydroxybutyric acid     Status: Abnormal    Collection Time: 08/22/19 11:56 AM  Result Value Ref Range   Beta-Hydroxybutyric Acid 7.98 (H) 0.05 - 0.27 mmol/L    Comment: RESULTS CONFIRMED BY MANUAL DILUTION Performed at [facility], [address] [place], [place] [address]   CBG monitoring, ED     Status: Abnormal   Collection Time: 08/22/19 12:07 PM  Result Value Ref Range   Glucose-Capillary 206 (H) 70 - 99 mg/dL   Comment 1 Notify RN   CBG monitoring, ED     Status: Abnormal   Collection Time: 08/22/19  5:16 PM  Result Value Ref Range   Glucose-Capillary 476 (H) 70 - 99 mg/dL   Comment 1 Notify RN   Basic metabolic panel     Status: Abnormal   Collection Time: 08/22/19  6:00 PM  Result Value Ref Range   Sodium 139 135 - 145 mmol/L   Potassium 5.5 (H) 3.5 - 5.1 mmol/L   Chloride 106 98 - 111 mmol/L   CO2 7 (L) 22 - 32 mmol/L   Glucose, Bld 540 (HH) 70 - 99 mg/dL    Comment: CRITICAL RESULT CALLED TO, READ BACK BY AND VERIFIED WITH: REBECCA GROVES,RN [phone] @ 1900 BY J SCOTTON    BUN 22 (H) 6 - 20 mg/dL   Creatinine, Ser 6.211.76 (H) 0.61 - 1.24 mg/dL   Calcium 8.3 (L) 8.9 - 10.3 mg/dL   GFR calc non Af Amer 44 (L) >60 mL/min   GFR calc Af Amer 51 (L) >60 mL/min   Anion gap 26 (H) 5 - 15    Comment: REPEATED TO VERIFY Performed at [facility], [address] [place], [place] [address]   CBG monitoring, ED     Status: Abnormal   Collection Time: 08/22/19  7:53 PM  Result Value Ref Range   Glucose-Capillary 513 (HH) 70 - 99 mg/dL   Comment 1 Notify RN   CBG monitoring, ED     Status: Abnormal   Collection Time: 08/22/19 10:40 PM  Result Value Ref Range   Glucose-Capillary 269 (H) 70 - 99 mg/dL  CBG monitoring,  ED     Status: Abnormal   Collection Time: 08/23/19 12:50 AM  Result Value Ref Range   Glucose-Capillary 144 (H) 70 - 99 mg/dL  CBG monitoring, ED     Status: Abnormal   Collection Time: 08/23/19  2:16 AM  Result Value Ref Range   Glucose-Capillary 133 (H) 70 - 99  mg/dL  Basic metabolic panel     Status: Abnormal   Collection Time: 08/23/19  2:19 AM  Result Value Ref Range   Sodium 142 135 - 145 mmol/L   Potassium 4.7 3.5 - 5.1 mmol/L   Chloride 109 98 - 111 mmol/L   CO2 15 (L) 22 - 32 mmol/L   Glucose, Bld 165 (H) 70 - 99 mg/dL   BUN 24 (H) 6 - 20 mg/dL   Creatinine, Ser 1.61 (H) 0.61 - 1.24 mg/dL   Calcium 8.5 (L) 8.9 - 10.3 mg/dL   GFR calc non Af Amer 45 (L) >60 mL/min   GFR calc Af Amer 53 (L) >60 mL/min   Anion gap 18 (H) 5 - 15    Comment: Performed at [facility], [address], [place], [place] [address]   DG Wrist 2 Views Right  Result Date: 08/22/2019 CLINICAL DATA:  Right wrist pain after fall striking wrist on something. EXAM: RIGHT WRIST - 2 VIEW COMPARISON:  None. FINDINGS: Lateral view is slightly limited by positioning. There is no evidence of fracture or dislocation. Minimal radial styloid spurring. There is no evidence of arthropathy or other focal bone abnormality. Soft tissues are unremarkable. IMPRESSION: No fracture or subluxation of the right wrist. Electronically Signed   By: Narda Rutherford M.D.   On: 08/22/2019 04:57   CT RENAL STONE STUDY  Result Date: 08/22/2019 CLINICAL DATA:  Flank pain with kidney stone suspected EXAM: CT ABDOMEN AND PELVIS WITHOUT CONTRAST TECHNIQUE: Multidetector CT imaging of the abdomen and pelvis was performed following the standard protocol without IV contrast. COMPARISON:  03/22/2016 FINDINGS: Lower chest:  Coronary calcifications seen along the RCA. Hepatobiliary: No focal liver abnormality.No evidence of biliary obstruction or stone. Pancreas: Unremarkable. Spleen: Unremarkable. Adrenals/Urinary Tract: Negative adrenals. No hydronephrosis or stone. Distended urinary bladder which could be physiologic. Stomach/Bowel:  No obstruction. No appendicitis. Vascular/Lymphatic: No acute vascular abnormality. Scattered atherosclerotic calcifications, notable for age. No mass or  adenopathy. Reproductive:No pathologic findings. Other: No ascites or pneumoperitoneum. Musculoskeletal: No acute abnormalities. IMPRESSION: 1. No hydronephrosis or ureteral calculus. 2. Distended urinary bladder. 3.  Aortic Atherosclerosis (ICD10-I70.0).  Coronary atherosclerosis. Electronically Signed   By: Marnee Spring M.D.   On: 08/22/2019 05:42    Pending Labs Unresulted Labs (From admission, onward)    Start     Ordered   08/29/19 0500  Creatinine, serum  (enoxaparin (LOVENOX)    CrCl >/= 30 ml/min)  Weekly,   R    Comments: while on enoxaparin therapy    08/22/19 0312   08/22/19 2300  Basic metabolic panel  (Diabetes Ketoacidosis (DKA))  Now then every 4 hours,   STAT     08/22/19 1926   08/22/19 1923  Beta-hydroxybutyric acid  (Diabetes Ketoacidosis (DKA))  Now then every 8 hours,   R (with STAT occurrences)     08/22/19 1926   08/22/19 1800  Beta-hydroxybutyric acid  (Diabetes Ketoacidosis (DKA))  Once-Timed,   STAT     08/22/19 1534          Vitals/Pain Today's Vitals   08/23/19 0130 08/23/19 0200 08/23/19 0230 08/23/19 0300  BP: 107/66 123/70 117/77 117/74  Pulse: 92 91 84 84  Resp: 15 13    Temp:      TempSrc:      SpO2: 100% 100% 100% 100%  PainSc:        Isolation Precautions No active isolations  Medications Medications  enoxaparin (LOVENOX) injection 40 mg (40 mg Subcutaneous Given 08/22/19 1614)  insulin regular, human (MYXREDLIN) 100 units/ 100 mL infusion (1.7 Units/hr Intravenous Rate/Dose Change 08/23/19 0218)  0.9 %  sodium chloride infusion ( Intravenous New Bag/Given 08/22/19 2054)  dextrose 5 %-0.45 % sodium chloride infusion ( Intravenous New Bag/Given 08/22/19 1938)  dextrose 50 % solution 0-50 mL (has no administration in time range)  sodium chloride 0.9 % bolus 1,000 mL (0 mLs Intravenous Stopped 08/21/19 2350)  ondansetron (ZOFRAN) injection 4 mg (4 mg Intravenous Given 08/22/19 0047)  sodium chloride 0.9 % bolus 20 mL/kg (0 mLs  Intravenous Stopped 08/22/19 2153)    Mobility walks     Focused Assessments    R Recommendations: See Admitting Provider Note  Report given to:   Additional Notes:

## 2019-08-23 NOTE — Progress Notes (Signed)
Spoke with MD about patient's insulin coverage via secure chat. MD placed orders for 25 units Novolog mix 70/30. Will continue to monitor CBG and patient status.

## 2019-08-23 NOTE — Progress Notes (Addendum)
Progress Note    John Cox  XHB:[id number] DOB: 1970/11/25  DOA: 08/21/2019 PCP: Patient, No Pcp Per       Assessment/Plan:   Principal Problem:   DKA (diabetic ketoacidoses) (HCC) Active Problems:   Polysubstance abuse (HCC)   ARF (acute renal failure) (HCC)   There is no height or weight on file to calculate BMI.   DKA: Resolved.  He is off of IV insulin infusion.  He has been started on insulin NPH which he takes at home.  Continue to monitor glucose levels.  Hemoglobin A1c is 12.2 which indicates poorly controlled diabetes mellitus.  Consulted dietitian to assist with dietary counseling.  Follow-up with diabetes coordinator.  Patient has been counseled on the importance of medical adherence.  AKI on  CKD stage IIIa: Continue IV fluids and monitor BMP  Hyperkalemia: Resolved  Nausea, vomiting and epigastric pain: Improved.  Lipase is normal.  Liver enzymes are okay.  Cocaine abuse: Counseled to quit.     Family Communication/Anticipated D/C date and plan/Code Status   DVT prophylaxis: Lovenox Code Status: Full code Family Communication: Plan discussed with the patient Disposition Plan: Possible discharge to home tomorrow     Subjective:   He complains of hunger.  No vomiting, abdominal pain or diarrhea.  Objective:    Vitals:   08/23/19 0400 08/23/19 0800 08/23/19 0900 08/23/19 1000  BP:  132/60 113/68 140/73  Pulse: 82 88 82 82  Resp: 15 14 12 16   Temp:  97.7 F (36.5 C)    TempSrc:  Oral    SpO2: 100% 100% 100% 100%    Intake/Output Summary (Last 24 hours) at 08/23/2019 1110 Last data filed at 08/23/2019 0819 Gross per 24 hour  Intake 756.2 ml  Output 800 ml  Net -43.8 ml   There were no vitals filed for this visit.  Exam:  GEN: NAD SKIN: No rash EYES: No pallor or icterus ENT: MMM CV: RRR PULM: CTA B ABD: soft, ND, NT, +BS CNS: AAO x 3, non focal EXT: No edema or tenderness   Data Reviewed:   I have personally  reviewed following labs and imaging studies:  Labs: Labs show the following:   Basic Metabolic Panel: Recent Labs  Lab 08/22/19 0615 08/22/19 1156 08/22/19 1800 08/23/19 0219 08/23/19 0650  NA 142 142 139 142 142  K 4.9 5.2* 5.5* 4.7 4.3  CL 112* 109 106 109 110  CO2 15* 16* 7* 15* 16*  GLUCOSE 155* 213* 540* 165* 108*  BUN 23* 20 22* 24* 22*  CREATININE 1.58* 1.55* 1.76* 1.73* 1.70*  CALCIUM 8.0* 8.1* 8.3* 8.5* 8.3*   GFR CrCl cannot be calculated (Unknown ideal weight.). Liver Function Tests: Recent Labs  Lab 08/22/19 0615  AST 31  ALT 40  ALKPHOS 59  BILITOT 1.6*  PROT 5.5*  ALBUMIN 3.1*   Recent Labs  Lab 08/22/19 0615  LIPASE 27   No results for input(s): AMMONIA in the last 168 hours. Coagulation profile No results for input(s): INR, PROTIME in the last 168 hours.  CBC: Recent Labs  Lab 08/21/19 2217 08/22/19 0410  WBC 5.7 6.9  NEUTROABS 4.5  --   HGB 13.6 12.9*  HCT 43.0 41.0  MCV 90.3 88.4  PLT 301 293   Cardiac Enzymes: Recent Labs  Lab 08/22/19 0410  CKTOTAL 135   BNP (last 3 results) No results for input(s): PROBNP in the last 8760 hours. CBG: Recent Labs  Lab 08/23/19 0523 08/23/19  1017 08/23/19 0724 08/23/19 0830 08/23/19 0931  GLUCAP 95 115* 134* 116* 99   D-Dimer: No results for input(s): DDIMER in the last 72 hours. Hgb A1c: Recent Labs    08/22/19 0410  HGBA1C 12.2*   Lipid Profile: No results for input(s): CHOL, HDL, LDLCALC, TRIG, CHOLHDL, LDLDIRECT in the last 72 hours. Thyroid function studies: No results for input(s): TSH, T4TOTAL, T3FREE, THYROIDAB in the last 72 hours.  Invalid input(s): FREET3 Anemia work up: No results for input(s): VITAMINB12, FOLATE, FERRITIN, TIBC, IRON, RETICCTPCT in the last 72 hours. Sepsis Labs: Recent Labs  Lab 08/21/19 2217 08/22/19 0410  WBC 5.7 6.9    Microbiology Recent Results (from the past 240 hour(s))  SARS CORONAVIRUS 2 (TAT 6-24 HRS) Nasopharyngeal  Nasopharyngeal Swab     Status: None   Collection Time: 08/22/19  1:08 AM   Specimen: Nasopharyngeal Swab  Result Value Ref Range Status   SARS Coronavirus 2 NEGATIVE NEGATIVE Final    Comment: (NOTE) SARS-CoV-2 target nucleic acids are NOT DETECTED. The SARS-CoV-2 RNA is generally detectable in upper and lower respiratory specimens during the acute phase of infection. Negative results do not preclude SARS-CoV-2 infection, do not rule out co-infections with other pathogens, and should not be used as the sole basis for treatment or other patient management decisions. Negative results must be combined with clinical observations, patient history, and epidemiological information. The expected result is Negative. Fact Sheet for Patients: SugarRoll.be Fact Sheet for Healthcare Providers: https://www.woods-mathews.com/ This test is not yet approved or cleared by the [place] FDA and  has been authorized for detection and/or diagnosis of SARS-CoV-2 by FDA under an Emergency Use Authorization (EUA). This EUA will remain  in effect (meaning this test can be used) for the duration of the COVID-19 declaration under Section 56 4(b)(1) of the Act, 21 U.S.C. section 360bbb-3(b)(1), unless the authorization is terminated or revoked sooner. Performed at [facility], [place] [address] [place], [place] [address]   MRSA PCR Screening     Status: None   Collection Time: 08/23/19  3:29 AM   Specimen: Nasopharyngeal  Result Value Ref Range Status   MRSA by PCR NEGATIVE NEGATIVE Final    Comment:        The GeneXpert MRSA Assay (FDA approved for NASAL specimens only), is one component of a comprehensive MRSA colonization surveillance program. It is not intended to diagnose MRSA infection nor to guide or monitor treatment for MRSA infections. Performed at [facility], [place] [address] [place], [place] [address]     Procedures  and diagnostic studies:  DG Wrist 2 Views Right  Result Date: 08/22/2019 CLINICAL DATA:  Right wrist pain after fall striking wrist on something. EXAM: RIGHT WRIST - 2 VIEW COMPARISON:  None. FINDINGS: Lateral view is slightly limited by positioning. There is no evidence of fracture or dislocation. Minimal radial styloid spurring. There is no evidence of arthropathy or other focal bone abnormality. Soft tissues are unremarkable. IMPRESSION: No fracture or subluxation of the right wrist. Electronically Signed   By: Keith Rake M.D.   On: 08/22/2019 04:57   CT RENAL STONE STUDY  Result Date: 08/22/2019 CLINICAL DATA:  Flank pain with kidney stone suspected EXAM: CT ABDOMEN AND PELVIS WITHOUT CONTRAST TECHNIQUE: Multidetector CT imaging of the abdomen and pelvis was performed following the standard protocol without IV contrast. COMPARISON:  03/22/2016 FINDINGS: Lower chest:  Coronary calcifications seen along the RCA. Hepatobiliary: No focal liver abnormality.No evidence of biliary  obstruction or stone. Pancreas: Unremarkable. Spleen: Unremarkable. Adrenals/Urinary Tract: Negative adrenals. No hydronephrosis or stone. Distended urinary bladder which could be physiologic. Stomach/Bowel:  No obstruction. No appendicitis. Vascular/Lymphatic: No acute vascular abnormality. Scattered atherosclerotic calcifications, notable for age. No mass or adenopathy. Reproductive:No pathologic findings. Other: No ascites or pneumoperitoneum. Musculoskeletal: No acute abnormalities. IMPRESSION: 1. No hydronephrosis or ureteral calculus. 2. Distended urinary bladder. 3.  Aortic Atherosclerosis (ICD10-I70.0).  Coronary atherosclerosis. Electronically Signed   By: Marnee SpringJonathon  Watts M.D.   On: 08/22/2019 05:42    Medications:   . Chlorhexidine Gluconate Cloth  6 each Topical Daily  . enoxaparin (LOVENOX) injection  40 mg Subcutaneous Q24H  . insulin NPH Human  20 Units Subcutaneous BID AC & HS  . mouth rinse  15 mL Mouth  Rinse BID   Continuous Infusions:    LOS: 1 day   Jonnie [place]  [facility]   *Please refer to [email or web address], password [email or web address] to get updated schedule on who will round on this patient, as hospitalists switch teams weekly. If 7PM-7AM, please contact night-coverage at [email or web address], password [email or web address] for any overnight needs.  08/23/2019, 11:10 AM

## 2019-08-24 LAB — BASIC METABOLIC PANEL
Anion gap: 6 (ref 5–15)
BUN: 19 mg/dL (ref 6–20)
CO2: 24 mmol/L (ref 22–32)
Calcium: 8.1 mg/dL — ABNORMAL LOW (ref 8.9–10.3)
Chloride: 108 mmol/L (ref 98–111)
Creatinine, Ser: 1.16 mg/dL (ref 0.61–1.24)
GFR calc Af Amer: >60 mL/min (ref >60)
GFR calc non Af Amer: >60 mL/min (ref >60)
Glucose, Bld: 47 mg/dL — ABNORMAL LOW (ref 70–99)
Potassium: 3.3 mmol/L — ABNORMAL LOW (ref 3.5–5.1)
Sodium: 138 mmol/L (ref 135–145)

## 2019-08-24 LAB — URINALYSIS, ROUTINE W REFLEX MICROSCOPIC
Bilirubin Urine: NEGATIVE
Glucose, UA: >=500 mg/dL — AB
Hgb urine dipstick: NEGATIVE
Ketones, ur: 5 mg/dL — AB
Leukocytes,Ua: NEGATIVE
Nitrite: NEGATIVE
Protein, ur: NEGATIVE mg/dL
Specific Gravity, Urine: 1.025 (ref 1.005–1.030)
pH: 5 (ref 5.0–8.0)

## 2019-08-24 LAB — GLUCOSE, CAPILLARY
Glucose-Capillary: 23 mg/dL — CL (ref 70–99)
Glucose-Capillary: 120 mg/dL — ABNORMAL HIGH (ref 70–99)
Glucose-Capillary: 47 mg/dL — ABNORMAL LOW (ref 70–99)
Glucose-Capillary: 209 mg/dL — ABNORMAL HIGH (ref 70–99)
Glucose-Capillary: 133 mg/dL — ABNORMAL HIGH (ref 70–99)

## 2019-08-24 LAB — GLUCOSE, RANDOM: Glucose, Bld: 75 mg/dL (ref 70–99)

## 2019-08-24 MED ORDER — FAMOTIDINE 20 MG PO TABS: 20.0000 mg | ORAL_TABLET | Freq: Every day | ORAL | 0 refills | Status: AC

## 2019-08-24 MED ORDER — GABAPENTIN 300 MG PO CAPS: 300.0000 mg | ORAL_CAPSULE | Freq: Two times a day (BID) | ORAL | 0 refills | Status: AC

## 2019-08-24 MED ORDER — "INSULIN SYRINGE-NEEDLE U-100 30G X 5/16"" 1 ML MISC": 1.0000 | Freq: Three times a day (TID) | 1 refills | Status: AC

## 2019-08-24 MED ORDER — FAMOTIDINE 20 MG PO TABS
20.0000 mg | ORAL_TABLET | Freq: Every day | ORAL | Status: DC
  Administered 2019-08-24: 09:00:00 20 mg via ORAL
  Filled 2019-08-24: qty 1

## 2019-08-24 MED ORDER — NOVOLIN 70/30 RELION (70-30) 100 UNIT/ML ~~LOC~~ SUSP: 25.0000 [IU] | Freq: Two times a day (BID) | SUBCUTANEOUS | 1 refills | Status: AC

## 2019-08-24 NOTE — TOC Transition Note (Signed)
Transition of Care [facility]) - CM/SW Discharge Note   Patient Details  Name: John Cox MRN: [id number] Date of Birth: 09/02/1970  [facility] of Care [facility] CM/SW Contact:  Servando Snare, LCSW Phone Number: 08/24/2019, 11:08 AM   Clinical Narrative:   [facility] consulted for PCP and assistance with meds. Patient has been seen several times in the past for same issue. LCSW put PCP and med instructions on AVS. Insulin can be purchased at [facility] for $25. There is no other assistance available for this. Patient has been seen by financial counseling in the past as well. LCSW notified floor RN.           Patient Goals and CMS Choice        Discharge Placement                       Discharge Plan and Services                                     Social Determinants of Health (SDOH) Interventions     Readmission Risk Interventions No flowsheet data found.

## 2019-08-24 NOTE — Discharge Instructions (Signed)
Acute Kidney Injury, Adult  Acute kidney injury is a sudden worsening of kidney function. The kidneys are organs that have several jobs. They filter the blood to remove waste products and extra fluid. They also maintain a healthy balance of minerals and hormones in the body, which helps control blood pressure and keep bones strong. With this condition, your kidneys do not do their jobs as well as they should. This condition ranges from mild to severe. Over time it may develop into long-lasting (chronic) kidney disease. Early detection and treatment may prevent acute kidney injury from developing into a chronic condition. What are the causes? Common causes of this condition include:  A problem with blood flow to the kidneys. This may be caused by: ? Low blood pressure (hypotension) or shock. ? Blood loss. ? Heart and blood vessel (cardiovascular) disease. ? Severe burns. ? Liver disease.  Direct damage to the kidneys. This may be caused by: ? Certain medicines. ? A kidney infection. ? Poisoning. ? Being around or in contact with toxic substances. ? A surgical wound. ? A hard, direct hit to the kidney area.  A sudden blockage of urine flow. This may be caused by: ? Cancer. ? Kidney stones. ? An enlarged prostate in males. What are the signs or symptoms? Symptoms of this condition may not be obvious until the condition becomes severe. Symptoms of this condition can include:  Tiredness (lethargy), or difficulty staying awake.  Nausea or vomiting.  Swelling (edema) of the face, legs, ankles, or feet.  Problems with urination, such as: ? Abdominal pain, or pain along the side of your stomach (flank). ? Decreased urine production. ? Decrease in the force of urine flow.  Muscle twitches and cramps, especially in the legs.  Confusion or trouble concentrating.  Loss of appetite.  Fever. How is this diagnosed? This condition may be diagnosed with tests, including:  Blood  tests.  Urine tests.  Imaging tests.  A test in which a sample of tissue is removed from the kidneys to be examined under a microscope (kidney biopsy). How is this treated? Treatment for this condition depends on the cause and how severe the condition is. In mild cases, treatment may not be needed. The kidneys may heal on their own. In more severe cases, treatment will involve:  Treating the cause of the kidney injury. This may involve changing any medicines you are taking or adjusting your dosage.  Fluids. You may need specialized IV fluids to balance your body's needs.  Having a catheter placed to drain urine and prevent blockages.  Preventing problems from occurring. This may mean avoiding certain medicines or procedures that can cause further injury to the kidneys. In some cases treatment may also require:  A procedure to remove toxic wastes from the body (dialysis or continuous renal replacement therapy - CRRT).  Surgery. This may be done to repair a torn kidney, or to remove the blockage from the urinary system. Follow these instructions at home: Medicines  Take over-the-counter and prescription medicines only as told by your health care provider.  Do not take any new medicines without your health care provider's approval. Many medicines can worsen your kidney damage.  Do not take any vitamin and mineral supplements without your health care provider's approval. Many nutritional supplements can worsen your kidney damage. Lifestyle  If your health care provider prescribed changes to your diet, follow them. You may need to decrease the amount of protein you eat.  Achieve and maintain a healthy   weight. If you need help with this, ask your health care provider.  Start or continue an exercise plan. Try to exercise at least 30 minutes a day, 5 days a week.  Do not use any tobacco products, such as cigarettes, chewing tobacco, and e-cigarettes. If you need help quitting, ask your  health care provider. General instructions  Keep track of your blood pressure. Report changes in your blood pressure as told by your health care provider.  Stay up to date with immunizations. Ask your health care provider which immunizations you need.  Keep all follow-up visits as told by your health care provider. This is important. Where to find more information  American [employer] of Kidney Patients: ResidentialShow.is  [employer]: www.kidney.org  [employer]: FightingMatch.com.ee  [facility]: ? www.lifeoptions.org ? www.kidneyschool.org Contact a health care provider if:  Your symptoms get worse.  You develop new symptoms. Get help right away if:  You develop symptoms of worsening kidney disease, which include: ? Headaches. ? Abnormally dark or light skin. ? Easy bruising. ? Frequent hiccups. ? Chest pain. ? Shortness of breath. ? End of menstruation in women. ? Seizures. ? Confusion or altered mental status. ? Abdominal or back pain. ? Itchiness.  You have a fever.  Your body is producing less urine.  You have pain or bleeding when you urinate. Summary  Acute kidney injury is a sudden worsening of kidney function.  Acute kidney injury can be caused by problems with blood flow to the kidneys, direct damage to the kidneys, and sudden blockage of urine flow.  Symptoms of this condition may not be obvious until it becomes severe. Symptoms may include edema, lethargy, confusion, nausea or vomiting, and problems passing urine.  This condition can usually be diagnosed with blood tests, urine tests, and imaging tests. Sometimes a kidney biopsy is done to diagnose this condition.  Treatment for this condition often involves treating the underlying cause. It is treated with fluids, medicines, dialysis, diet changes, or surgery. This information is not intended to replace advice given to you by your health care provider. Make  sure you discuss any questions you have with your health care provider. Document Released: 03/03/2011 Document Revised: 07/31/2017 Document Reviewed: 08/08/2016 Elsevier Patient Education  2020 Else[employer] Inc.   Blood Glucose Monitoring, Adult Monitoring your blood sugar (glucose) is an important part of managing your diabetes (diabetes mellitus). Blood glucose monitoring involves checking your blood glucose as often as directed and keeping a record (log) of your results over time. Checking your blood glucose regularly and keeping a blood glucose log can:  Help you and your health care provider adjust your diabetes management plan as needed, including your medicines or insulin.  Help you understand how food, exercise, illnesses, and medicines affect your blood glucose.  Let you know what your blood glucose is at any time. You can quickly find out if you have low blood glucose (hypoglycemia) or high blood glucose (hyperglycemia). Your health care provider will set individualized treatment goals for you. Your goals will be based on your age, other medical conditions you have, and how you respond to diabetes treatment. Generally, the goal of treatment is to maintain the following blood glucose levels:  Before meals (preprandial): 80-130 mg/dL (9.1-4.7 mmol/L).  After meals (postprandial): below 180 mg/dL (10 mmol/L).  A1c level: less than 7%. Supplies needed:  Blood glucose meter.  Test strips for your meter. Each meter has its own strips. You must use the strips  that came with your meter.  A needle to prick your finger (lancet). Do not use a lancet more than one time.  A device that holds the lancet (lancing device).  A journal or log book to write down your results. How to check your blood glucose  1. Wash your hands with soap and water. 2. Prick the side of your finger (not the tip) with the lancet. Use a different finger each time. 3. Gently rub the finger until a small drop of  blood appears. 4. Follow instructions that come with your meter for inserting the test strip, applying blood to the strip, and using your blood glucose meter. 5. Write down your result and any notes. Some meters allow you to use areas of your body other than your finger (alternative sites) to test your blood. The most common alternative sites are:  Forearm.  Thigh.  Palm of the hand. If you think you may have hypoglycemia, or if you have a history of not knowing when your blood glucose is getting low (hypoglycemia unawareness), do not use alternative sites. Use your finger instead. Alternative sites may not be as accurate as the fingers, because blood flow is slower in these areas. This means that the result you get may be delayed, and it may be different from the result that you would get from your finger. Follow these instructions at home: Blood glucose log   Every time you check your blood glucose, write down your result. Also write down any notes about things that may be affecting your blood glucose, such as your diet and exercise for the day. This information can help you and your health care provider: ? Look for patterns in your blood glucose over time. ? Adjust your diabetes management plan as needed.  Check if your meter allows you to download your records to a computer. Most glucose meters store a record of glucose readings in the meter. If you have type 1 diabetes:  Check your blood glucose 2 or more times a day.  Also check your blood glucose: ? Before every insulin injection. ? Before and after exercise. ? Before meals. ? 2 hours after a meal. ? Occasionally between 2:00 a.m. and 3:00 a.m., as directed. ? Before potentially dangerous tasks, like driving or using heavy machinery. ? At bedtime.  You may need to check your blood glucose more often, up to 6-10 times a day, if you: ? Use an insulin pump. ? Need multiple daily injections (MDI). ? Have diabetes that is not  well-controlled. ? Are ill. ? Have a history of severe hypoglycemia. ? Have hypoglycemia unawareness. If you have type 2 diabetes:  If you take insulin or other diabetes medicines, check your blood glucose 2 or more times a day.  If you are on intensive insulin therapy, check your blood glucose 4 or more times a day. Occasionally, you may also need to check between 2:00 a.m. and 3:00 a.m., as directed.  Also check your blood glucose: ? Before and after exercise. ? Before potentially dangerous tasks, like driving or using heavy machinery.  You may need to check your blood glucose more often if: ? Your medicine is being adjusted. ? Your diabetes is not well-controlled. ? You are ill. General tips  Always keep your supplies with you.  If you have questions or need help, all blood glucose meters have a 24-hour "hotline" phone number that you can call. You may also contact your health care provider.  After you use  a few boxes of test strips, adjust (calibrate) your blood glucose meter by following instructions that came with your meter. Contact a health care provider if:  Your blood glucose is at or above 240 mg/dL (13.3 mmol/L) for 2 days in a row.  You have been sick or have had a fever for 2 days or longer, and you are not getting better.  You have any of the following problems for more than 6 hours: ? You cannot eat or drink. ? You have nausea or vomiting. ? You have diarrhea. Get help right away if:  Your blood glucose is lower than 54 mg/dL (3 mmol/L).  You become confused or you have trouble thinking clearly.  You have difficulty breathing.  You have moderate or large ketone levels in your urine. Summary  Monitoring your blood sugar (glucose) is an important part of managing your diabetes (diabetes mellitus).  Blood glucose monitoring involves checking your blood glucose as often as directed and keeping a record (log) of your results over time.  Your health care  provider will set individualized treatment goals for you. Your goals will be based on your age, other medical conditions you have, and how you respond to diabetes treatment.  Every time you check your blood glucose, write down your result. Also write down any notes about things that may be affecting your blood glucose, such as your diet and exercise for the day. This information is not intended to replace advice given to you by your health care provider. Make sure you discuss any questions you have with your health care provider. Document Released: 08/21/2003 Document Revised: 06/11/2018 Document Reviewed: 01/28/2016 Elsevier Patient Education  2020 [employer].  Diabetic Ketoacidosis Diabetic ketoacidosis is a serious complication of diabetes. This condition develops when there is not enough insulin in the body. Insulin is an hormone that regulates blood sugar levels in the body. Normally, insulin allows glucose to enter the cells in the body. The cells break down glucose for energy. Without enough insulin, the body cannot break down glucose, so it breaks down fats instead. This leads to high blood glucose levels in the body and the production of acids that are called ketones. Ketones are poisonous at high levels. If diabetic ketoacidosis is not treated, it can cause severe dehydration and can lead to a coma or death. What are the causes? This condition develops when a lack of insulin causes the body to break down fats instead of glucose. This may be triggered by:  Stress on the body. This stress is brought on by an illness.  Infection.  Medicines that raise blood glucose levels.  Not taking diabetes medicine.  New onset of type 1 diabetes mellitus. What are the signs or symptoms? Symptoms of this condition include:  Fatigue.  Weight loss.  Excessive thirst.  Light-headedness.  Fruity or sweet-smelling breath.  Excessive urination.  Vision changes.  Confusion or  irritability.  Nausea.  Vomiting.  Rapid breathing.  Abdominal pain.  Feeling flushed. How is this diagnosed? This condition is diagnosed based on your medical history, a physical exam, and blood tests. You may also have a urine test to check for ketones. How is this treated? This condition may be treated with:  Fluid replacement. This may be done to correct dehydration.  Insulin injections. These may be given through the skin or through an IV tube.  Electrolyte replacement. Electrolytes are minerals in your blood. Electrolytes such as potassium and sodium may be given in pill form or  through an IV tube.  Antibiotic medicines. These may be prescribed if your condition was caused by an infection. Diabetic ketoacidosis is a serious medical condition. You may need emergency treatment in the hospital to monitor your condition. Follow these instructions at home: Eating and drinking  Drink enough fluids to keep your urine clear or pale yellow.  If you are not able to eat, drink clear fluids in small amounts as you are able. Clear fluids include water, ice chips, fruit juice with water added (diluted), and low-calorie sports drinks. You may also have sugar-free jello or popsicles.  If you are able to eat, follow your usual diet and drink sugar-free liquids, such as water. Medicines  Take over-the-counter and prescription medicines only as told by your health care provider.  Continue to take insulin and other diabetes medicines as told by your health care provider.  If you were prescribed an antibiotic, take it as told by your health care provider. Do not stop taking the antibiotic even if you start to feel better. General instructions   Check your urine for ketones when you are ill and as told by your health care provider. ? If your blood glucose is 240 mg/dL (08.6 mmol/L) or higher, check your urine ketones every 4-6 hours.  Check your blood glucose every day, as often as told  by your health care provider. ? If your blood glucose is high, drink plenty of fluids. This helps to flush out ketones. ? If your blood glucose is above your target for 2 tests in a row, contact your health care provider.  Carry a medical alert card or wear medical alert jewelry that says that you have diabetes.  Rest and exercise only as told by your health care provider. Do not exercise when your blood glucose is high and you have ketones in your urine.  If you get sick, call your health care provider and begin treatment quickly. Your body often needs extra insulin to fight an illness. Check your blood glucose every 4-6 hours when you are sick.  Keep all follow-up visits as told by your health care provider. This is important. Contact a health care provider if:  Your blood glucose level is higher than 240 mg/dL (57.8 mmol/L) for 2 days in a row.  You have moderate or large ketones in your urine.  You have a fever.  You cannot eat or drink without vomiting.  You have been vomiting for more than 2 hours.  You continue to have symptoms of diabetic ketoacidosis.  You develop new symptoms. Get help right away if:  Your blood glucose monitor reads "high" even when you are taking insulin.  You faint.  You have chest pain.  You have trouble breathing.  You have sudden trouble speaking or swallowing.  You have vomiting or diarrhea that gets worse after 3 hours.  You are unable to stay awake.  You have trouble thinking.  You are severely dehydrated. Symptoms of severe dehydration include: ? Extreme thirst. ? Dry mouth. ? Rapid breathing. These symptoms may represent a serious problem that is an emergency. Do not wait to see if the symptoms will go away. Get medical help right away. Call your local emergency services (911 in the [place] Do not drive yourself to the hospital. Summary  Diabetic ketoacidosis is a serious complication of diabetes. This condition develops when  there is not enough insulin in the body.  This condition is diagnosed based on your medical history, a physical exam, and  blood tests. You may also have a urine test to check for ketones.  Diabetic ketoacidosis is a serious medical condition. You may need emergency treatment in the hospital to monitor your condition.  Contact your health care provider if your blood glucose is higher than 240 mg/dl for 2 days in a row or if you have moderate or large ketones in your urine. This information is not intended to replace advice given to you by your health care provider. Make sure you discuss any questions you have with your health care provider. Document Released: 08/15/2000 Document Revised: 10/03/2016 Document Reviewed: 09/22/2016 Elsevier Patient Education  2020 Elsevier Inc. Insulin can be obtained at [facility] pharmacy for $25.    DIABETES LABEL READING TIPS The Nutrition Facts information is based on a standard serving size.  However, that serving size may not be the same as the serving size used in carbohydrate counting. Always start by checking the serving size on the label.  Is this the serving size you will be eating? How many servings are in the package? Next, look at the total carbohydrate. It is measured in grams (g).  To find the number of carbohydrate servings in 1 standard serving of a food, divide the total grams of carbohydrate by 15.  One (1) carbohydrate serving is the amount of food with 15 g carbohydrate. You don't need to count grams of sugars. They are included in the total carbohydrate. The label shows how many calories are in the standard serving. It also lists the amount of fat, cholesterol, sodium, protein, and some vitamins and minerals.  Look below the line listing total fat to find out how much of that fat is saturated fat or trans fat.  Choose foods that are low in these kinds of fats because they are not healthy for your heart.  In the foods that are healthiest  for your heart, grams of saturated fat and trans fat are less than one-third of the total fat grams. If foods are very low in calories (less than 20 calories per serving) or carbohydrates (5 g carbohydrate or less per serving), you may not need to count them when you count carbohydrates.     Carbohydrate Counting for People with Diabetes Foods with carbohydrates make your blood glucose level go up.  Learning how to count carbohydrates can help you control your blood glucose levels.  First, identify the foods you eat that contain carbohydrates.  Then, using the Foods with Carbohydrates chart, determine about how much carbohydrates are in your meals and snacks.  Make sure you are eating foods with fiber, protein, and healthy fat along with your carbohydrate foods.  Foods with Carbohydrates The following table shows carbohydrate foods that have about 15 grams of carbohydrate each.  Using measuring cups, spoons, or a food scale when you first begin learning about carbohydrate counting can help you learn about the portion sizes you typically eat.  The following foods have 15 grams carbohydrate each: Grains 1 slice bread (1 ounce) 1 small tortilla (6-inch size)  large bagel (1 ounce) 1/3 cup pasta or rice (cooked)  hamburger or hot dog bun ( ounce)  cup cooked cereal  to  cup ready-to-eat cereal 2 taco shells (5-inch size) Fruit 1 small fresh fruit ( to 1 cup)  medium banana 17 small grapes (3 ounces) 1 cup melon or berries  cup canned or frozen fruit 2 tablespoons dried fruit (blueberries, cherries, cranberries, raisins)  cup unsweetened fruit juice Starchy Vegetables  cup cooked  beans, peas, corn, potatoes/sweet potatoes  large baked potato (3 ounces) 1 cup acorn or butternut squash Snack Foods 3 to 6 crackers 8 potato chips or 13 tortilla chips ( ounce to 1 ounce) 3 cups popped popcorn Dairy 3/4 cup (6 ounces) nonfat plain yogurt, or yogurt with sugar-free  sweetener 1 cup milk 1 cup plain rice, soy, coconut or flavored almond milk Sweets and Desserts  cup ice cream or frozen yogurt 1 tablespoon jam, jelly, pancake syrup, table sugar, or honey 2 tablespoons light pancake syrup 1 inch square of frosted cake or 2 inch square of unfrosted cake 2 small cookies (2/3 ounce each) or  large cookie  Sometimes you'll have to estimate carbohydrate amounts if you don't know the exact recipe.  One cup of mixed foods like soups can have 1 to 2 carbohydrate servings, while some casseroles might have 2 or more servings of carbohydrate. Foods that have less than 20 calories in each serving can be counted as "free" foods.  Count 1 cup raw vegetables, or  cup cooked non-starchy vegetables as "free" foods.  If you eat 3 or more servings at one meal, then count them as 1 carbohydrate serving.  Foods without Carbohydrates Not all foods contain carbohydrates.  Meat, some dairy, fats, non-starchy vegetables, and many beverages don't contain carbohydrate.  So when you count carbohydrates, you can generally exclude chicken, pork, beef, fish, seafood, eggs, tofu, cheese, butter, sour cream, avocado, nuts, seeds, olives, mayonnaise, water, black coffee, unsweetened tea, and zero-calorie drinks.  Vegetables with no or low carbohydrate include green beans, cauliflower, tomatoes, and onions. How much carbohydrate should I eat at each meal? Carbohydrate counting can help you plan your meals and manage your weight.  Following are some starting points for carbohydrate intake at each meal.   To Lose Weight To Maintain Weight Women 2-3 carb servings 3-4 carb servings Men 3-4 carb servings 4-5 carb servings Checking your blood glucose after meals will help you know if you need to adjust the timing, type, or number of carbohydrate servings in your meal plan.  Achieve and keep a healthy body weight by balancing your food intake and physical activity.  How should I plan my  meals? Plan for half the food on your plate to include non-starchy vegetables, like salad greens, broccoli, or carrots.  Try to eat 3-5 servings of non-starchy vegetables every day.  Have a protein food at each meal.  Protein foods include chicken, fish, meat, eggs, or beans (note that beans contain carbohydrate).  These two food groups (non-starchy vegetables and proteins) are low in carbohydrate.  If you fill up your plate with these foods, you will eat less carbohydrate but still fill up your stomach.  Try to limit your carbohydrate portion to  of the plate.  What fats are healthiest to eat? Diabetes increases risk for heart disease.  To help protect your heart, eat more healthy fats, such as olive oil, nuts, and avocado.  Eat less saturated fats like butter, cream, and high-fat meats, like bacon and sausage.  Avoid trans fats, which are in all foods that list "partially hydrogenated oil" as an ingredient.  What should I drink? Choose drinks that are not sweetened with sugar.  The healthiest choices are water, carbonated or seltzer waters, and tea and coffee without added sugars. Sweet drinks will make your blood glucose go up very quickly.  One serving of soda or energy drink is  cup.  It is best to drink these beverages  only if your blood glucose is low. Artificially sweetened, or diet drinks, typically do not increase your blood glucose if they have zero calories in them.  Read labels of beverages, as some diet drinks do have carbohydrate and will raise your blood glucose.  Label Reading Tips Read Nutrition Facts labels to find out how many grams of carbohydrate are in a food you want to eat.  Don't forget: sometimes serving sizes on the label aren't the same as how much food you are going to eat, so you may need to calculate how much carbohydrate is in the food you are serving yourself.  Carbohydrate Counting for People with Diabetes Sample 1-Day Menu Breakfast  cup yogurt,  low fat, low sugar (1 carbohydrate serving)  cup cereal, ready-to-eat, unsweetened (1 carbohydrate serving) 1 cup strawberries (1 carbohydrate serving)  cup almonds ( carbohydrate serving) Lunch 1.5 ounce can chunk light tuna 2 ounces cheese, low fat cheddar 6 whole wheat crackers (1 carbohydrate serving) 1 small apple (1 carbohydrate servings)  cup carrots ( carbohydrate serving)  cup snap peas 1 cup 1% milk (1 carbohydrate serving) Evening Meal Stir fry made with: 3 ounces chicken 1 cup brown rice (3 carbohydrate servings)  cup broccoli ( carbohydrate serving)  cup green beans  cup onions 1 tablespoon olive oil 2 tablespoons teriyaki sauce ( carbohydrate serving) Evening Snack 1 extra small banana (1 carbohydrate serving) 1 tablespoon peanut butter

## 2019-08-24 NOTE — Progress Notes (Signed)
0423 Patient's lab glucose resulted at 47. Nurse checked patient's CBG which resulted at 23 initially. Nurse re-checked the CBG in the other hand and it resulted at 47. Nurse informed the practitioner on call and followed the hypoglycemic protocol.  Patient was alert and oriented x 4 and talking with the nurse. Patient was given Apple juice and some graham crackers. Patient's CBG was then re-checked which resulted at 120. Nurse will continue to monitor the patient closely.

## 2019-08-24 NOTE — Progress Notes (Signed)
This chaplain responded to RN-Laney's clothing request anticipating Pt. discharge.  The chaplain delivered the clothing to the unit.  F/U spiritual care is available as needed.

## 2019-08-24 NOTE — Discharge Summary (Addendum)
Physician Discharge Summary  Patient ID: John Cox MRN: [id number] DOB/AGE: 49-Jul-1972 49 y.o.  Admit date: 08/21/2019 Discharge date: 08/24/2019  Admission Diagnoses: DKA (diabetic ketoacidoses) [facility] [place]) Discharge Diagnoses:  Principal Problem:   DKA (diabetic ketoacidoses) ([place]) Active Problems:   Polysubstance abuse ([place])   ARF (acute renal failure) ([place])   Discharged Condition: fair  Hospital Course:  48 y.o. male with history of diabetes mellitus type 2, chronic kidney disease and polysubstance abuse history of noncompliance, presents to the ER after patient has been having intractable nausea vomiting over the last 2 days and feeling weak. On admission,  blood sugar was 586 bicarb 11 anion gap was 25 creatinine 1.8 increased from 1.6 urine showing ketones.  Urine drug screen was positive for cocaine.  Covid test is pending.  Patient was given fluid bolus and started on IV insulin infusion for DKA.  DKA: Resolved now  -   He has been off of IV insulin infusion.  He has been started on insulin NPH which he takes at home.    He also then received Lantus  - Hemoglobin A1c is 12.2 which indicates poorly controlled diabetes mellitus.  - Consulted dietitian to assist with dietary counseling. - Patient has been counseled on the importance of medical adherence. -Consulted social worker to see patient qualifies for any assistance.  As per patient, patient has no insurance and he ran out of home insulin 3 to 4 days prior to this admission.  As per Education officer, museum, "Patient has been seen several times in the past for same issue. LCSW put PCP and med instructions on AVS. Insulin can be purchased at [facility] for $25. There is no other assistance available for this. Patient has been seen by financial counseling in the past as well. LCSW notified floor RN." -Patient's prescription for NovoLog 70/30, home insulin, with same dose instruction along with famotidine and gabapentin sent to [place].  Also advised to use good Rx which may provide some additional help.  Patient was advised to check blood glucose three times daily at least and keep the blood glucose log. -Patient's UA shows some rare bacteria.  However patient did not have any fever.  Also denied any dysuria or hematuria when asked by me this morning.  Not concern for infection at this time. -Also requested for a PCP referral.  Advised strongly to see PCP within a week or two with the blood glucose log and may repeat UA if he has any complaint at that time. -Nurse is to make a PCP appointment before discharge  AKI on  CKD stage IIIa:  Resolved  -Status post IV fluids  -Advised to continue p.o. intake  Hyperkalemia: Resolved  Nausea, vomiting and epigastric pain: Resolved.  Lipase is normal.  Liver enzymes are okay.  Cocaine abuse: No acute intoxication or withdrawal signs or symptoms -Counseled on cessation and abstinence  Consults: SW  Significant Diagnostic Studies: Imaging and blood work  Treatments: As per hospital course and discharge medlist   Discharge Exam: Blood pressure 113/61, pulse 74, temperature 98.6 F (37 C), temperature source Oral, resp. rate 16, height _0  (1.778 m), weight 58.8 kg, SpO2 100 %.  GEN: NAD SKIN: No rash EYES: No pallor or icterus ENT: MMM CV: RRR PULM: CTA B ABD: soft, ND, NT, +BS CNS: AAO x 3, non focal EXT: No edema or tenderness  Disposition: Discharge disposition: 01-Home or Self Care     Patient was counseled extensively on adherence to treatment  plan.  Patient was also explained and given the warning signs when he must seek immediate medical attention.  He was advised to check his blood glucose 3 times daily as he has a blood glucose monitor or glucometer at home prescribed to him.  New prescription for strips and home insulin sent to the pharmacy where he could obtain medication with reasonably lower price.  Also advised to use good Rx if  appropriate.  Advised Education officer, museum for PCP referral.  Discharge Instructions    Activity as tolerated - No restrictions   Complete by: As directed    Call MD for:  difficulty breathing, headache or visual disturbances   Complete by: As directed    Call MD for:  extreme fatigue   Complete by: As directed    Call MD for:  persistant dizziness or light-headedness   Complete by: As directed    Diet - low sodium heart healthy   Complete by: As directed    Diet Carb Modified   Complete by: As directed    Increase activity slowly   Complete by: As directed      Allergies as of 08/24/2019   No Known Allergies     Medication List    TAKE these medications   blood glucose meter kit and supplies Dispense based on patient and insurance preference. Use up to four times daily as directed. (FOR ICD-10 E10.9, E11.9).   famotidine 20 MG tablet Commonly known as: PEPCID Take 1 tablet (20 mg total) by mouth daily. Start taking on: August 25, 2019   gabapentin 300 MG capsule Commonly known as: NEURONTIN Take 1 capsule (300 mg total) by mouth 2 (two) times daily.   Insulin Syringe-Needle U-100 30G X 5/16" 1 ML Misc Commonly known as: RELION INSULIN SYR 1CC/30G 1 Syringe by Does not apply route 3 (three) times daily. What changed: when to take this   NovoLIN 70/30 ReliOn (70-30) 100 UNIT/ML injection Generic drug: insulin NPH-regular Human Inject 25 Units into the skin 2 (two) times daily with a meal.      Follow-up Information    [facility]. Schedule an appointment as soon as possible for a visit in 1 week(s).   Why: Please call to make an appoinmtent at discharge.  Contact information: [place] [id number] [phone]          Signed: Thornell Mule 08/24/2019, 3:20 PM

## 2019-09-14 ENCOUNTER — Ambulatory Visit: Payer: Self-pay | Admitting: Family Medicine

## 2021-04-01 DEATH — deceased
